# Patient Record
Sex: Female | Born: 1935 | Race: White | Hispanic: No | Smoking: Never smoker
Health system: Southern US, Community
[De-identification: ages and names within clinical notes are randomized; demographics above are authoritative.]

## PROBLEM LIST (undated history)

## (undated) DIAGNOSIS — Z923 Personal history of irradiation: Secondary | ICD-10-CM

## (undated) DIAGNOSIS — C189 Malignant neoplasm of colon, unspecified: Secondary | ICD-10-CM

## (undated) DIAGNOSIS — R32 Unspecified urinary incontinence: Secondary | ICD-10-CM

## (undated) DIAGNOSIS — G629 Polyneuropathy, unspecified: Secondary | ICD-10-CM

## (undated) DIAGNOSIS — Z9221 Personal history of antineoplastic chemotherapy: Secondary | ICD-10-CM

## (undated) DIAGNOSIS — Z8619 Personal history of other infectious and parasitic diseases: Secondary | ICD-10-CM

## (undated) DIAGNOSIS — R0989 Other specified symptoms and signs involving the circulatory and respiratory systems: Secondary | ICD-10-CM

## (undated) DIAGNOSIS — N200 Calculus of kidney: Secondary | ICD-10-CM

## (undated) DIAGNOSIS — I10 Essential (primary) hypertension: Secondary | ICD-10-CM

## (undated) DIAGNOSIS — E7800 Pure hypercholesterolemia, unspecified: Secondary | ICD-10-CM

## (undated) HISTORY — PX: VENTRAL HERNIA REPAIR: SHX424

## (undated) HISTORY — DX: Pure hypercholesterolemia, unspecified: E78.00

## (undated) HISTORY — DX: Unspecified urinary incontinence: R32

## (undated) HISTORY — DX: Malignant neoplasm of colon, unspecified: C18.9

## (undated) HISTORY — DX: Calculus of kidney: N20.0

## (undated) HISTORY — PX: COLOSTOMY: SHX63

## (undated) HISTORY — DX: Polyneuropathy, unspecified: G62.9

## (undated) HISTORY — DX: Essential (primary) hypertension: I10

## (undated) HISTORY — PX: OTHER SURGICAL HISTORY: SHX169

## (undated) HISTORY — DX: Personal history of other infectious and parasitic diseases: Z86.19

## (undated) HISTORY — DX: Other specified symptoms and signs involving the circulatory and respiratory systems: R09.89

---

## 1977-01-29 HISTORY — PX: APPENDECTOMY: SHX54

## 2004-05-09 ENCOUNTER — Ambulatory Visit: Payer: Self-pay | Admitting: Internal Medicine

## 2005-05-10 ENCOUNTER — Ambulatory Visit: Payer: Self-pay | Admitting: Internal Medicine

## 2005-05-22 ENCOUNTER — Ambulatory Visit: Payer: Self-pay | Admitting: Internal Medicine

## 2006-08-06 ENCOUNTER — Ambulatory Visit: Payer: Self-pay | Admitting: Internal Medicine

## 2007-08-07 ENCOUNTER — Ambulatory Visit: Payer: Self-pay | Admitting: Internal Medicine

## 2007-08-14 ENCOUNTER — Ambulatory Visit: Payer: Self-pay | Admitting: Internal Medicine

## 2008-09-15 ENCOUNTER — Ambulatory Visit: Payer: Self-pay | Admitting: Internal Medicine

## 2009-09-20 ENCOUNTER — Ambulatory Visit: Payer: Self-pay | Admitting: Internal Medicine

## 2010-02-23 ENCOUNTER — Ambulatory Visit: Payer: Self-pay | Admitting: Internal Medicine

## 2010-04-14 ENCOUNTER — Ambulatory Visit: Payer: Self-pay | Admitting: Urology

## 2010-09-25 ENCOUNTER — Ambulatory Visit: Payer: Self-pay | Admitting: Internal Medicine

## 2011-11-01 ENCOUNTER — Ambulatory Visit: Payer: Self-pay | Admitting: Internal Medicine

## 2012-01-30 DIAGNOSIS — Z923 Personal history of irradiation: Secondary | ICD-10-CM

## 2012-01-30 DIAGNOSIS — Z9221 Personal history of antineoplastic chemotherapy: Secondary | ICD-10-CM

## 2012-01-30 HISTORY — DX: Personal history of irradiation: Z92.3

## 2012-01-30 HISTORY — DX: Personal history of antineoplastic chemotherapy: Z92.21

## 2012-10-10 ENCOUNTER — Ambulatory Visit: Payer: Self-pay | Admitting: Gastroenterology

## 2012-10-13 LAB — PATHOLOGY REPORT

## 2012-10-16 ENCOUNTER — Ambulatory Visit: Payer: Self-pay | Admitting: Oncology

## 2012-10-21 ENCOUNTER — Ambulatory Visit: Payer: Self-pay | Admitting: Oncology

## 2012-10-21 LAB — COMPREHENSIVE METABOLIC PANEL
Albumin: 3.7 g/dL (ref 3.4–5.0)
Alkaline Phosphatase: 88 U/L (ref 50–136)
Anion Gap: 3 — ABNORMAL LOW (ref 7–16)
BUN: 15 mg/dL (ref 7–18)
Chloride: 105 mmol/L (ref 98–107)
Co2: 32 mmol/L (ref 21–32)
Creatinine: 1.01 mg/dL (ref 0.60–1.30)
EGFR (African American): >60
EGFR (Non-African Amer.): 54 — ABNORMAL LOW
Osmolality: 281 (ref 275–301)
Sodium: 140 mmol/L (ref 136–145)
Total Protein: 7.3 g/dL (ref 6.4–8.2)

## 2012-10-21 LAB — CBC CANCER CENTER
Basophil #: 0.1 x10 3/mm (ref 0.0–0.1)
Basophil %: 1.2 %
Eosinophil #: 0 x10 3/mm (ref 0.0–0.7)
Eosinophil %: 0.3 %
HCT: 43.3 % (ref 35.0–47.0)
Lymphocyte %: 20.1 %
MCV: 100 fL (ref 80–100)
Monocyte #: 0.7 x10 3/mm (ref 0.2–0.9)
Monocyte %: 7 %
Neutrophil %: 71.4 %
WBC: 10.6 x10 3/mm (ref 3.6–11.0)

## 2012-10-21 LAB — PROTIME-INR: Prothrombin Time: 14.2 secs (ref 11.5–14.7)

## 2012-10-29 ENCOUNTER — Ambulatory Visit: Payer: Self-pay | Admitting: Oncology

## 2012-10-30 ENCOUNTER — Ambulatory Visit: Payer: Self-pay | Admitting: Gastroenterology

## 2012-11-03 ENCOUNTER — Ambulatory Visit: Payer: Self-pay | Admitting: Internal Medicine

## 2012-11-06 ENCOUNTER — Ambulatory Visit: Payer: Self-pay | Admitting: Oncology

## 2012-11-11 ENCOUNTER — Ambulatory Visit: Payer: Self-pay | Admitting: Surgery

## 2012-11-13 ENCOUNTER — Encounter: Payer: Self-pay | Admitting: Podiatry

## 2012-11-14 ENCOUNTER — Ambulatory Visit (INDEPENDENT_AMBULATORY_CARE_PROVIDER_SITE_OTHER): Payer: Medicare Other

## 2012-11-14 ENCOUNTER — Ambulatory Visit (INDEPENDENT_AMBULATORY_CARE_PROVIDER_SITE_OTHER): Payer: Medicare Other | Admitting: Podiatry

## 2012-11-14 ENCOUNTER — Encounter: Payer: Self-pay | Admitting: Podiatry

## 2012-11-14 VITALS — BP 126/79 | HR 77 | Resp 14 | Ht 64.0 in | Wt 178.0 lb

## 2012-11-14 DIAGNOSIS — M21611 Bunion of right foot: Secondary | ICD-10-CM

## 2012-11-14 DIAGNOSIS — M21612 Bunion of left foot: Secondary | ICD-10-CM

## 2012-11-14 DIAGNOSIS — L600 Ingrowing nail: Secondary | ICD-10-CM

## 2012-11-14 DIAGNOSIS — M21619 Bunion of unspecified foot: Secondary | ICD-10-CM

## 2012-11-14 DIAGNOSIS — M2010 Hallux valgus (acquired), unspecified foot: Secondary | ICD-10-CM

## 2012-11-14 DIAGNOSIS — M2040 Other hammer toe(s) (acquired), unspecified foot: Secondary | ICD-10-CM

## 2012-11-14 NOTE — Progress Notes (Signed)
N ingrown toenail, was infected  L left great medial corner  D 2 weeks ago  O gradual  C better  A T was on antibiotic doxcycline ,   Both feet bunions and hammertoes

## 2012-11-14 NOTE — Patient Instructions (Signed)

## 2012-11-14 NOTE — Progress Notes (Signed)
Subjective:     [name], female   DOB: 11/29/1935, 77 y.o.   MRN: [id number]  HPI patient presents to the office stating I have a bad ingrown toenail on her left big toe and a spicule of nail on my right that is bothersome and catches when I put on socks or shoes patient states that she was on an antibiotic for the left big toe and now it is just sore but not in fact   Review of Systems  All other systems reviewed and are negative.       Objective:   Physical Exam  Nursing note and vitals reviewed. Constitutional: She appears well-developed and well-nourished.  Cardiovascular: Intact distal pulses.   Musculoskeletal: Normal range of motion.  Neurological: She is alert.   patient is found to have structural imbalances in the forefoot with elevated second toes bilateral rigid contracture that do become painful and structural bunion deformity. I noted the hallux nails bilateral medial borders are ingrown with the left hallux being along the entire border and the right hallux being a spicule from previous nail surgery     Assessment:     Hammertoe deformity second bilateral with rigid contracture and HAV deformity bilateral. Chronic ingrown toenails hallux bilateral    Plan:     H&P and x-rays reviewed with the patient. I went today and I discussed correction of the ingrown toenails and we discussed the bunions hammertoes and have recommended not doing surgery but trying Padding. I explained to her the risk of fixing the ingrown toenails and she wants to have it done I infiltrated each hallux 60 mg Xylocaine Marcaine mixture and removed the medial border I then applied chemical 3 applications followed by alcohol lavage and each border. I applied sterile dressings and instructed on soaks

## 2012-11-18 ENCOUNTER — Ambulatory Visit: Payer: Self-pay | Admitting: Surgery

## 2012-11-18 ENCOUNTER — Telehealth: Payer: Self-pay | Admitting: *Deleted

## 2012-11-18 NOTE — Telephone Encounter (Signed)
Pt called wanting to know how long to soak. Advised pt to soak twice daily in betadine and water until toe stops draining.

## 2012-11-24 LAB — COMPREHENSIVE METABOLIC PANEL
Alkaline Phosphatase: 71 U/L (ref 50–136)
Anion Gap: 9 (ref 7–16)
BUN: 13 mg/dL (ref 7–18)
Bilirubin,Total: 0.6 mg/dL (ref 0.2–1.0)
Calcium, Total: 8.3 mg/dL — ABNORMAL LOW (ref 8.5–10.1)
Chloride: 103 mmol/L (ref 98–107)
Creatinine: 0.96 mg/dL (ref 0.60–1.30)
EGFR (Non-African Amer.): 58 — ABNORMAL LOW
Glucose: 179 mg/dL — ABNORMAL HIGH (ref 65–99)
Potassium: 3.5 mmol/L (ref 3.5–5.1)
SGPT (ALT): 34 U/L (ref 12–78)

## 2012-11-24 LAB — CBC CANCER CENTER
Basophil %: 1.1 %
Eosinophil %: 1.1 %
HCT: 40.9 % (ref 35.0–47.0)
HGB: 13.8 g/dL (ref 12.0–16.0)
Lymphocyte #: 1.7 x10 3/mm (ref 1.0–3.6)
MCH: 34.1 pg — ABNORMAL HIGH (ref 26.0–34.0)
Monocyte #: 0.7 x10 3/mm (ref 0.2–0.9)
Neutrophil %: 74.3 %
Platelet: 298 x10 3/mm (ref 150–440)
RBC: 4.05 10*6/uL (ref 3.80–5.20)
WBC: 10 x10 3/mm (ref 3.6–11.0)

## 2012-11-29 ENCOUNTER — Ambulatory Visit: Payer: Self-pay | Admitting: Oncology

## 2012-12-01 LAB — COMPREHENSIVE METABOLIC PANEL
Albumin: 3.3 g/dL — ABNORMAL LOW (ref 3.4–5.0)
Alkaline Phosphatase: 80 U/L (ref 50–136)
Anion Gap: 8 (ref 7–16)
BUN: 12 mg/dL (ref 7–18)
Chloride: 102 mmol/L (ref 98–107)
Creatinine: 0.89 mg/dL (ref 0.60–1.30)
EGFR (African American): >60
EGFR (Non-African Amer.): >60
Potassium: 3.8 mmol/L (ref 3.5–5.1)
SGPT (ALT): 30 U/L (ref 12–78)
Total Protein: 6.7 g/dL (ref 6.4–8.2)

## 2012-12-01 LAB — CBC CANCER CENTER
Basophil #: 0.1 x10 3/mm (ref 0.0–0.1)
Eosinophil #: 0.3 x10 3/mm (ref 0.0–0.7)
Eosinophil %: 3.1 %
HCT: 40.8 % (ref 35.0–47.0)
HGB: 13.9 g/dL (ref 12.0–16.0)
Lymphocyte %: 12.9 %
MCHC: 34 g/dL (ref 32.0–36.0)
Monocyte #: 0.5 x10 3/mm (ref 0.2–0.9)
Monocyte %: 5.5 %
Neutrophil #: 6.6 x10 3/mm — ABNORMAL HIGH (ref 1.4–6.5)
Neutrophil %: 77.5 %
Platelet: 287 x10 3/mm (ref 150–440)
RBC: 4.04 10*6/uL (ref 3.80–5.20)
RDW: 12.7 % (ref 11.5–14.5)
WBC: 8.5 x10 3/mm (ref 3.6–11.0)

## 2012-12-08 LAB — CBC CANCER CENTER
Basophil #: 0 x10 3/mm (ref 0.0–0.1)
Basophil %: 0.5 %
HGB: 12.9 g/dL (ref 12.0–16.0)
Lymphocyte #: 0.6 x10 3/mm — ABNORMAL LOW (ref 1.0–3.6)
MCH: 34.3 pg — ABNORMAL HIGH (ref 26.0–34.0)
MCHC: 33.9 g/dL (ref 32.0–36.0)
MCV: 101 fL — ABNORMAL HIGH (ref 80–100)
Monocyte #: 0.5 x10 3/mm (ref 0.2–0.9)
Monocyte %: 6.9 %
Neutrophil #: 5.4 x10 3/mm (ref 1.4–6.5)
Platelet: 235 x10 3/mm (ref 150–440)
RBC: 3.77 10*6/uL — ABNORMAL LOW (ref 3.80–5.20)
WBC: 7 x10 3/mm (ref 3.6–11.0)

## 2012-12-08 LAB — COMPREHENSIVE METABOLIC PANEL
Albumin: 3.1 g/dL — ABNORMAL LOW (ref 3.4–5.0)
Alkaline Phosphatase: 75 U/L (ref 50–136)
Anion Gap: 6 — ABNORMAL LOW (ref 7–16)
BUN: 10 mg/dL (ref 7–18)
Bilirubin,Total: 0.7 mg/dL (ref 0.2–1.0)
Calcium, Total: 8.7 mg/dL (ref 8.5–10.1)
Co2: 31 mmol/L (ref 21–32)
EGFR (African American): >60
Glucose: 192 mg/dL — ABNORMAL HIGH (ref 65–99)
SGPT (ALT): 28 U/L (ref 12–78)

## 2012-12-15 LAB — COMPREHENSIVE METABOLIC PANEL
Alkaline Phosphatase: 68 U/L (ref 50–136)
Anion Gap: 6 — ABNORMAL LOW (ref 7–16)
BUN: 15 mg/dL (ref 7–18)
Bilirubin,Total: 0.5 mg/dL (ref 0.2–1.0)
Calcium, Total: 8.2 mg/dL — ABNORMAL LOW (ref 8.5–10.1)
Co2: 31 mmol/L (ref 21–32)
Creatinine: 0.91 mg/dL (ref 0.60–1.30)
EGFR (African American): >60
Osmolality: 281 (ref 275–301)
SGOT(AST): 24 U/L (ref 15–37)
Sodium: 138 mmol/L (ref 136–145)
Total Protein: 6.2 g/dL — ABNORMAL LOW (ref 6.4–8.2)

## 2012-12-15 LAB — CBC CANCER CENTER
Basophil #: 0.1 x10 3/mm (ref 0.0–0.1)
Basophil %: 1.4 %
Eosinophil %: 8.2 %
HCT: 38.1 % (ref 35.0–47.0)
HGB: 13 g/dL (ref 12.0–16.0)
Lymphocyte #: 0.6 x10 3/mm — ABNORMAL LOW (ref 1.0–3.6)
MCH: 34.3 pg — ABNORMAL HIGH (ref 26.0–34.0)
MCHC: 34 g/dL (ref 32.0–36.0)
Monocyte #: 0.6 x10 3/mm (ref 0.2–0.9)
Monocyte %: 9.6 %
Neutrophil #: 4.7 x10 3/mm (ref 1.4–6.5)
Platelet: 176 x10 3/mm (ref 150–440)
RBC: 3.78 10*6/uL — ABNORMAL LOW (ref 3.80–5.20)
RDW: 13.4 % (ref 11.5–14.5)
WBC: 6.6 x10 3/mm (ref 3.6–11.0)

## 2012-12-22 LAB — CBC CANCER CENTER
Basophil %: 0.6 %
Eosinophil #: 0.6 x10 3/mm (ref 0.0–0.7)
Eosinophil %: 9.6 %
HGB: 12.7 g/dL (ref 12.0–16.0)
Lymphocyte #: 0.5 x10 3/mm — ABNORMAL LOW (ref 1.0–3.6)
MCH: 34.3 pg — ABNORMAL HIGH (ref 26.0–34.0)
MCV: 102 fL — ABNORMAL HIGH (ref 80–100)
Monocyte #: 0.5 x10 3/mm (ref 0.2–0.9)
Neutrophil %: 73.6 %
Platelet: 195 x10 3/mm (ref 150–440)
RBC: 3.72 10*6/uL — ABNORMAL LOW (ref 3.80–5.20)
RDW: 13.8 % (ref 11.5–14.5)

## 2012-12-22 LAB — COMPREHENSIVE METABOLIC PANEL
Albumin: 2.9 g/dL — ABNORMAL LOW (ref 3.4–5.0)
Alkaline Phosphatase: 58 U/L
Anion Gap: 6 — ABNORMAL LOW (ref 7–16)
BUN: 8 mg/dL (ref 7–18)
Bilirubin,Total: 0.6 mg/dL (ref 0.2–1.0)
Calcium, Total: 8.2 mg/dL — ABNORMAL LOW (ref 8.5–10.1)
Co2: 29 mmol/L (ref 21–32)
Creatinine: 0.83 mg/dL (ref 0.60–1.30)
EGFR (African American): >60
Glucose: 145 mg/dL — ABNORMAL HIGH (ref 65–99)
Potassium: 3.7 mmol/L (ref 3.5–5.1)
SGOT(AST): 22 U/L (ref 15–37)
SGPT (ALT): 29 U/L (ref 12–78)

## 2012-12-29 ENCOUNTER — Ambulatory Visit: Payer: Self-pay | Admitting: Oncology

## 2012-12-29 LAB — CBC CANCER CENTER
Eosinophil #: 0.2 x10 3/mm (ref 0.0–0.7)
Eosinophil %: 3.3 %
HCT: 36.3 % (ref 35.0–47.0)
Lymphocyte #: 0.3 x10 3/mm — ABNORMAL LOW (ref 1.0–3.6)
MCH: 35.2 pg — ABNORMAL HIGH (ref 26.0–34.0)
MCHC: 34.9 g/dL (ref 32.0–36.0)
MCV: 101 fL — ABNORMAL HIGH (ref 80–100)
Monocyte %: 19.2 %
Neutrophil #: 4.2 x10 3/mm (ref 1.4–6.5)
Platelet: 276 x10 3/mm (ref 150–440)
RBC: 3.59 10*6/uL — ABNORMAL LOW (ref 3.80–5.20)
RDW: 14.5 % (ref 11.5–14.5)
WBC: 5.7 x10 3/mm (ref 3.6–11.0)

## 2012-12-29 LAB — COMPREHENSIVE METABOLIC PANEL
Albumin: 2.6 g/dL — ABNORMAL LOW (ref 3.4–5.0)
Alkaline Phosphatase: 56 U/L
Anion Gap: 10 (ref 7–16)
BUN: 11 mg/dL (ref 7–18)
Bilirubin,Total: 0.9 mg/dL (ref 0.2–1.0)
Calcium, Total: 8.1 mg/dL — ABNORMAL LOW (ref 8.5–10.1)
Chloride: 96 mmol/L — ABNORMAL LOW (ref 98–107)
Creatinine: 0.98 mg/dL (ref 0.60–1.30)
EGFR (Non-African Amer.): 56 — ABNORMAL LOW
Glucose: 189 mg/dL — ABNORMAL HIGH (ref 65–99)
Osmolality: 273 (ref 275–301)
SGOT(AST): 19 U/L (ref 15–37)
SGPT (ALT): 26 U/L (ref 12–78)

## 2012-12-29 LAB — MAGNESIUM: Magnesium: 1.7 mg/dL — ABNORMAL LOW

## 2013-01-05 LAB — COMPREHENSIVE METABOLIC PANEL
Alkaline Phosphatase: 97 U/L
Anion Gap: 7 (ref 7–16)
BUN: 12 mg/dL (ref 7–18)
Calcium, Total: 8.1 mg/dL — ABNORMAL LOW (ref 8.5–10.1)
Chloride: 93 mmol/L — ABNORMAL LOW (ref 98–107)
Co2: 31 mmol/L (ref 21–32)
Glucose: 197 mg/dL — ABNORMAL HIGH (ref 65–99)
Osmolality: 268 (ref 275–301)
Potassium: 3 mmol/L — ABNORMAL LOW (ref 3.5–5.1)
SGOT(AST): 24 U/L (ref 15–37)
Total Protein: 5.7 g/dL — ABNORMAL LOW (ref 6.4–8.2)

## 2013-01-05 LAB — CBC CANCER CENTER
Basophil #: 0 x10 3/mm (ref 0.0–0.1)
Basophil %: 0.2 %
Eosinophil #: 0 x10 3/mm (ref 0.0–0.7)
Eosinophil %: 0.3 %
HCT: 32.9 % — ABNORMAL LOW (ref 35.0–47.0)
HGB: 11.4 g/dL — ABNORMAL LOW (ref 12.0–16.0)
Lymphocyte #: 0.1 x10 3/mm — ABNORMAL LOW (ref 1.0–3.6)
MCV: 101 fL — ABNORMAL HIGH (ref 80–100)
Monocyte #: 1 x10 3/mm — ABNORMAL HIGH (ref 0.2–0.9)
Monocyte %: 11.4 %
Neutrophil %: 86.7 %
Platelet: 327 x10 3/mm (ref 150–440)
RBC: 3.25 10*6/uL — ABNORMAL LOW (ref 3.80–5.20)

## 2013-01-05 LAB — MAGNESIUM: Magnesium: 1.9 mg/dL

## 2013-01-12 LAB — CBC CANCER CENTER
Basophil %: 0.5 %
Eosinophil %: 0.1 %
HCT: 32.8 % — ABNORMAL LOW (ref 35.0–47.0)
HGB: 11.1 g/dL — ABNORMAL LOW (ref 12.0–16.0)
Lymphocyte #: 0.4 x10 3/mm — ABNORMAL LOW (ref 1.0–3.6)
Lymphocyte %: 3.9 %
MCHC: 33.9 g/dL (ref 32.0–36.0)
Monocyte #: 0.9 x10 3/mm (ref 0.2–0.9)
Monocyte %: 9.6 %
Neutrophil %: 85.9 %
RDW: 16.3 % — ABNORMAL HIGH (ref 11.5–14.5)

## 2013-01-12 LAB — COMPREHENSIVE METABOLIC PANEL
Albumin: 2.1 g/dL — ABNORMAL LOW (ref 3.4–5.0)
Alkaline Phosphatase: 82 U/L
BUN: 7 mg/dL (ref 7–18)
Bilirubin,Total: 0.7 mg/dL (ref 0.2–1.0)
Calcium, Total: 7.9 mg/dL — ABNORMAL LOW (ref 8.5–10.1)
Chloride: 94 mmol/L — ABNORMAL LOW (ref 98–107)
EGFR (Non-African Amer.): 56 — ABNORMAL LOW
Glucose: 221 mg/dL — ABNORMAL HIGH (ref 65–99)
Osmolality: 271 (ref 275–301)
Potassium: 3.5 mmol/L (ref 3.5–5.1)
SGPT (ALT): 28 U/L (ref 12–78)
Sodium: 133 mmol/L — ABNORMAL LOW (ref 136–145)

## 2013-01-13 LAB — CEA: CEA: 1.8 ng/mL (ref 0.0–4.7)

## 2013-01-29 ENCOUNTER — Ambulatory Visit: Payer: Self-pay | Admitting: Oncology

## 2013-02-25 ENCOUNTER — Ambulatory Visit: Payer: Self-pay | Admitting: Surgery

## 2013-02-25 LAB — CBC
HCT: 35.9 % (ref 35.0–47.0)
HGB: 12.2 g/dL (ref 12.0–16.0)
MCH: 36 pg — ABNORMAL HIGH (ref 26.0–34.0)
MCHC: 33.9 g/dL (ref 32.0–36.0)
MCV: 106 fL — AB (ref 80–100)
Platelet: 296 10*3/uL (ref 150–440)
RBC: 3.38 10*6/uL — ABNORMAL LOW (ref 3.80–5.20)
RDW: 15.3 % — ABNORMAL HIGH (ref 11.5–14.5)
WBC: 6.5 10*3/uL (ref 3.6–11.0)

## 2013-02-25 LAB — COMPREHENSIVE METABOLIC PANEL
ALBUMIN: 3.4 g/dL (ref 3.4–5.0)
ANION GAP: 4 — AB (ref 7–16)
Alkaline Phosphatase: 63 U/L
BILIRUBIN TOTAL: 0.7 mg/dL (ref 0.2–1.0)
BUN: 8 mg/dL (ref 7–18)
CREATININE: 0.89 mg/dL (ref 0.60–1.30)
Calcium, Total: 8.9 mg/dL (ref 8.5–10.1)
Chloride: 103 mmol/L (ref 98–107)
Co2: 34 mmol/L — ABNORMAL HIGH (ref 21–32)
EGFR (African American): >60
EGFR (Non-African Amer.): >60
GLUCOSE: 183 mg/dL — AB (ref 65–99)
Osmolality: 284 (ref 275–301)
POTASSIUM: 3.7 mmol/L (ref 3.5–5.1)
SGOT(AST): 22 U/L (ref 15–37)
SGPT (ALT): 25 U/L (ref 12–78)
SODIUM: 141 mmol/L (ref 136–145)
Total Protein: 6.6 g/dL (ref 6.4–8.2)

## 2013-03-02 ENCOUNTER — Ambulatory Visit: Payer: Self-pay | Admitting: Oncology

## 2013-03-02 LAB — CBC CANCER CENTER
BASOS PCT: 3.9 %
Basophil #: 0.3 x10 3/mm — ABNORMAL HIGH (ref 0.0–0.1)
EOS PCT: 0.2 %
Eosinophil #: 0 x10 3/mm (ref 0.0–0.7)
HCT: 37.6 % (ref 35.0–47.0)
HGB: 12.5 g/dL (ref 12.0–16.0)
Lymphocyte #: 1.3 x10 3/mm (ref 1.0–3.6)
Lymphocyte %: 17.6 %
MCH: 35.1 pg — ABNORMAL HIGH (ref 26.0–34.0)
MCHC: 33.3 g/dL (ref 32.0–36.0)
MCV: 105 fL — ABNORMAL HIGH (ref 80–100)
MONOS PCT: 8.9 %
Monocyte #: 0.6 x10 3/mm (ref 0.2–0.9)
Neutrophil #: 4.9 x10 3/mm (ref 1.4–6.5)
Neutrophil %: 69.4 %
PLATELETS: 274 x10 3/mm (ref 150–440)
RBC: 3.56 10*6/uL — AB (ref 3.80–5.20)
RDW: 14.2 % (ref 11.5–14.5)
WBC: 7.1 x10 3/mm (ref 3.6–11.0)

## 2013-03-02 LAB — COMPREHENSIVE METABOLIC PANEL
ALT: 22 U/L (ref 12–78)
AST: 23 U/L (ref 15–37)
Albumin: 3.3 g/dL — ABNORMAL LOW (ref 3.4–5.0)
Alkaline Phosphatase: 60 U/L
Anion Gap: 12 (ref 7–16)
BILIRUBIN TOTAL: 0.6 mg/dL (ref 0.2–1.0)
BUN: 10 mg/dL (ref 7–18)
CO2: 28 mmol/L (ref 21–32)
CREATININE: 0.88 mg/dL (ref 0.60–1.30)
Calcium, Total: 8.2 mg/dL — ABNORMAL LOW (ref 8.5–10.1)
Chloride: 101 mmol/L (ref 98–107)
EGFR (African American): >60
Glucose: 137 mg/dL — ABNORMAL HIGH (ref 65–99)
Osmolality: 282 (ref 275–301)
Potassium: 2.9 mmol/L — ABNORMAL LOW (ref 3.5–5.1)
SODIUM: 141 mmol/L (ref 136–145)
Total Protein: 6.5 g/dL (ref 6.4–8.2)

## 2013-03-03 LAB — CEA: CEA: 1.2 ng/mL (ref 0.0–4.7)

## 2013-03-06 ENCOUNTER — Inpatient Hospital Stay: Payer: Self-pay | Admitting: Surgery

## 2013-03-06 DIAGNOSIS — C218 Malignant neoplasm of overlapping sites of rectum, anus and anal canal: Secondary | ICD-10-CM

## 2013-03-06 DIAGNOSIS — R32 Unspecified urinary incontinence: Secondary | ICD-10-CM

## 2013-03-06 HISTORY — DX: Unspecified urinary incontinence: R32

## 2013-03-07 LAB — CBC WITH DIFFERENTIAL/PLATELET
Basophil #: 0.1 10*3/uL (ref 0.0–0.1)
Basophil %: 0.7 %
Eosinophil #: 0 10*3/uL (ref 0.0–0.7)
Eosinophil %: 0 %
HCT: 31.3 % — ABNORMAL LOW (ref 35.0–47.0)
HGB: 10.9 g/dL — ABNORMAL LOW (ref 12.0–16.0)
LYMPHS ABS: 1 10*3/uL (ref 1.0–3.6)
LYMPHS PCT: 7.2 %
MCH: 37.3 pg — ABNORMAL HIGH (ref 26.0–34.0)
MCHC: 34.9 g/dL (ref 32.0–36.0)
MCV: 107 fL — ABNORMAL HIGH (ref 80–100)
MONO ABS: 1.1 x10 3/mm — AB (ref 0.2–0.9)
Monocyte %: 7.7 %
NEUTROS PCT: 84.4 %
Neutrophil #: 12.1 10*3/uL — ABNORMAL HIGH (ref 1.4–6.5)
Platelet: 242 10*3/uL (ref 150–440)
RBC: 2.93 10*6/uL — ABNORMAL LOW (ref 3.80–5.20)
RDW: 14.3 % (ref 11.5–14.5)
WBC: 14.3 10*3/uL — AB (ref 3.6–11.0)

## 2013-03-07 LAB — BASIC METABOLIC PANEL
ANION GAP: 5 — AB (ref 7–16)
BUN: 7 mg/dL (ref 7–18)
CO2: 29 mmol/L (ref 21–32)
CREATININE: 0.87 mg/dL (ref 0.60–1.30)
Calcium, Total: 7.9 mg/dL — ABNORMAL LOW (ref 8.5–10.1)
Chloride: 103 mmol/L (ref 98–107)
EGFR (African American): >60
EGFR (Non-African Amer.): >60
Glucose: 197 mg/dL — ABNORMAL HIGH (ref 65–99)
OSMOLALITY: 277 (ref 275–301)
POTASSIUM: 3.1 mmol/L — AB (ref 3.5–5.1)
SODIUM: 137 mmol/L (ref 136–145)

## 2013-03-08 LAB — CBC WITH DIFFERENTIAL/PLATELET
BASOS ABS: 0 10*3/uL (ref 0.0–0.1)
Basophil %: 0.4 %
EOS ABS: 0.1 10*3/uL (ref 0.0–0.7)
EOS PCT: 1 %
HCT: 32.3 % — ABNORMAL LOW (ref 35.0–47.0)
HGB: 10.8 g/dL — AB (ref 12.0–16.0)
Lymphocyte #: 1 10*3/uL (ref 1.0–3.6)
Lymphocyte %: 10.8 %
MCH: 36.4 pg — ABNORMAL HIGH (ref 26.0–34.0)
MCHC: 33.5 g/dL (ref 32.0–36.0)
MCV: 109 fL — ABNORMAL HIGH (ref 80–100)
Monocyte #: 0.9 x10 3/mm (ref 0.2–0.9)
Monocyte %: 10.3 %
Neutrophil #: 7.1 10*3/uL — ABNORMAL HIGH (ref 1.4–6.5)
Neutrophil %: 77.5 %
Platelet: 272 10*3/uL (ref 150–440)
RBC: 2.97 10*6/uL — ABNORMAL LOW (ref 3.80–5.20)
RDW: 14.1 % (ref 11.5–14.5)
WBC: 9.2 10*3/uL (ref 3.6–11.0)

## 2013-03-08 LAB — BASIC METABOLIC PANEL
Anion Gap: 2 — ABNORMAL LOW (ref 7–16)
BUN: 6 mg/dL — ABNORMAL LOW (ref 7–18)
CREATININE: 0.84 mg/dL (ref 0.60–1.30)
Calcium, Total: 7.8 mg/dL — ABNORMAL LOW (ref 8.5–10.1)
Chloride: 105 mmol/L (ref 98–107)
Co2: 30 mmol/L (ref 21–32)
Glucose: 129 mg/dL — ABNORMAL HIGH (ref 65–99)
OSMOLALITY: 273 (ref 275–301)
Potassium: 3.9 mmol/L (ref 3.5–5.1)
Sodium: 137 mmol/L (ref 136–145)

## 2013-03-11 LAB — PATHOLOGY REPORT

## 2013-03-12 LAB — PLATELET COUNT: Platelet: 279 10*3/uL (ref 150–440)

## 2013-03-16 LAB — PLATELET COUNT: Platelet: 330 10*3/uL (ref 150–440)

## 2013-03-20 LAB — PLATELET COUNT: Platelet: 325 10*3/uL (ref 150–440)

## 2013-03-29 ENCOUNTER — Ambulatory Visit: Payer: Self-pay | Admitting: Oncology

## 2013-04-13 LAB — CBC CANCER CENTER
BASOS ABS: 0.1 x10 3/mm (ref 0.0–0.1)
BASOS PCT: 1.3 %
Eosinophil #: 0 x10 3/mm (ref 0.0–0.7)
Eosinophil %: 0.6 %
HCT: 35.4 % (ref 35.0–47.0)
HGB: 11.8 g/dL — AB (ref 12.0–16.0)
Lymphocyte #: 0.7 x10 3/mm — ABNORMAL LOW (ref 1.0–3.6)
Lymphocyte %: 10.4 %
MCH: 33.9 pg (ref 26.0–34.0)
MCHC: 33.3 g/dL (ref 32.0–36.0)
MCV: 102 fL — AB (ref 80–100)
MONO ABS: 0.7 x10 3/mm (ref 0.2–0.9)
MONOS PCT: 9.6 %
NEUTROS ABS: 5.5 x10 3/mm (ref 1.4–6.5)
Neutrophil %: 78.1 %
Platelet: 358 x10 3/mm (ref 150–440)
RBC: 3.47 10*6/uL — ABNORMAL LOW (ref 3.80–5.20)
RDW: 13.2 % (ref 11.5–14.5)
WBC: 7 x10 3/mm (ref 3.6–11.0)

## 2013-04-13 LAB — COMPREHENSIVE METABOLIC PANEL
ALK PHOS: 69 U/L
AST: 13 U/L — AB (ref 15–37)
Albumin: 2.8 g/dL — ABNORMAL LOW (ref 3.4–5.0)
Anion Gap: 5 — ABNORMAL LOW (ref 7–16)
BUN: 3 mg/dL — AB (ref 7–18)
Bilirubin,Total: 0.4 mg/dL (ref 0.2–1.0)
CHLORIDE: 101 mmol/L (ref 98–107)
CO2: 33 mmol/L — AB (ref 21–32)
CREATININE: 1 mg/dL (ref 0.60–1.30)
Calcium, Total: 8.5 mg/dL (ref 8.5–10.1)
EGFR (African American): >60
GFR CALC NON AF AMER: 55 — AB
Glucose: 218 mg/dL — ABNORMAL HIGH (ref 65–99)
OSMOLALITY: 281 (ref 275–301)
POTASSIUM: 3.2 mmol/L — AB (ref 3.5–5.1)
SGPT (ALT): 13 U/L (ref 12–78)
SODIUM: 139 mmol/L (ref 136–145)
Total Protein: 6.3 g/dL — ABNORMAL LOW (ref 6.4–8.2)

## 2013-04-14 LAB — CEA: CEA: 1 ng/mL (ref 0.0–4.7)

## 2013-04-27 LAB — CBC CANCER CENTER
BASOS ABS: 0.1 x10 3/mm (ref 0.0–0.1)
Basophil %: 2.4 %
Eosinophil #: 0.1 x10 3/mm (ref 0.0–0.7)
Eosinophil %: 2.3 %
HCT: 34.9 % — ABNORMAL LOW (ref 35.0–47.0)
HGB: 11.7 g/dL — ABNORMAL LOW (ref 12.0–16.0)
LYMPHS PCT: 19.2 %
Lymphocyte #: 0.6 x10 3/mm — ABNORMAL LOW (ref 1.0–3.6)
MCH: 33.5 pg (ref 26.0–34.0)
MCHC: 33.5 g/dL (ref 32.0–36.0)
MCV: 100 fL (ref 80–100)
Monocyte #: 0.5 x10 3/mm (ref 0.2–0.9)
Monocyte %: 15.2 %
NEUTROS PCT: 60.9 %
Neutrophil #: 1.8 x10 3/mm (ref 1.4–6.5)
PLATELETS: 226 x10 3/mm (ref 150–440)
RBC: 3.49 10*6/uL — ABNORMAL LOW (ref 3.80–5.20)
RDW: 13.1 % (ref 11.5–14.5)
WBC: 3 x10 3/mm — ABNORMAL LOW (ref 3.6–11.0)

## 2013-04-27 LAB — COMPREHENSIVE METABOLIC PANEL
ALK PHOS: 74 U/L
Albumin: 2.9 g/dL — ABNORMAL LOW (ref 3.4–5.0)
Anion Gap: 7 (ref 7–16)
BILIRUBIN TOTAL: 0.3 mg/dL (ref 0.2–1.0)
BUN: 8 mg/dL (ref 7–18)
CHLORIDE: 102 mmol/L (ref 98–107)
CREATININE: 0.75 mg/dL (ref 0.60–1.30)
Calcium, Total: 8.5 mg/dL (ref 8.5–10.1)
Co2: 32 mmol/L (ref 21–32)
EGFR (African American): >60
EGFR (Non-African Amer.): >60
Glucose: 180 mg/dL — ABNORMAL HIGH (ref 65–99)
Osmolality: 284 (ref 275–301)
Potassium: 3.2 mmol/L — ABNORMAL LOW (ref 3.5–5.1)
SGOT(AST): 28 U/L (ref 15–37)
SGPT (ALT): 55 U/L (ref 12–78)
Sodium: 141 mmol/L (ref 136–145)
Total Protein: 6.2 g/dL — ABNORMAL LOW (ref 6.4–8.2)

## 2013-04-29 ENCOUNTER — Ambulatory Visit: Payer: Self-pay | Admitting: Oncology

## 2013-05-11 LAB — CBC CANCER CENTER
Basophil #: 0.1 x10 3/mm (ref 0.0–0.1)
Basophil %: 3.3 %
Eosinophil #: 0.1 x10 3/mm (ref 0.0–0.7)
Eosinophil %: 2.6 %
HCT: 34.9 % — ABNORMAL LOW (ref 35.0–47.0)
HGB: 11.7 g/dL — ABNORMAL LOW (ref 12.0–16.0)
LYMPHS ABS: 0.5 x10 3/mm — AB (ref 1.0–3.6)
Lymphocyte %: 24.4 %
MCH: 33.3 pg (ref 26.0–34.0)
MCHC: 33.6 g/dL (ref 32.0–36.0)
MCV: 99 fL (ref 80–100)
MONO ABS: 0.4 x10 3/mm (ref 0.2–0.9)
MONOS PCT: 17.6 %
NEUTROS PCT: 52.1 %
Neutrophil #: 1.1 x10 3/mm — ABNORMAL LOW (ref 1.4–6.5)
Platelet: 206 x10 3/mm (ref 150–440)
RBC: 3.52 10*6/uL — ABNORMAL LOW (ref 3.80–5.20)
RDW: 14.1 % (ref 11.5–14.5)
WBC: 2.2 x10 3/mm — ABNORMAL LOW (ref 3.6–11.0)

## 2013-05-11 LAB — COMPREHENSIVE METABOLIC PANEL
ALT: 55 U/L (ref 12–78)
ANION GAP: 9 (ref 7–16)
Albumin: 3 g/dL — ABNORMAL LOW (ref 3.4–5.0)
Alkaline Phosphatase: 72 U/L
BILIRUBIN TOTAL: 0.5 mg/dL (ref 0.2–1.0)
BUN: 10 mg/dL (ref 7–18)
CHLORIDE: 101 mmol/L (ref 98–107)
Calcium, Total: 8.6 mg/dL (ref 8.5–10.1)
Co2: 30 mmol/L (ref 21–32)
Creatinine: 0.75 mg/dL (ref 0.60–1.30)
Glucose: 196 mg/dL — ABNORMAL HIGH (ref 65–99)
Osmolality: 284 (ref 275–301)
POTASSIUM: 2.4 mmol/L — AB (ref 3.5–5.1)
SGOT(AST): 34 U/L (ref 15–37)
Sodium: 140 mmol/L (ref 136–145)
TOTAL PROTEIN: 6.1 g/dL — AB (ref 6.4–8.2)

## 2013-05-11 LAB — MAGNESIUM: Magnesium: 1.4 mg/dL — ABNORMAL LOW

## 2013-05-18 LAB — COMPREHENSIVE METABOLIC PANEL
ANION GAP: 8 (ref 7–16)
Albumin: 2.8 g/dL — ABNORMAL LOW (ref 3.4–5.0)
Alkaline Phosphatase: 63 U/L
BUN: 9 mg/dL (ref 7–18)
Bilirubin,Total: 0.3 mg/dL (ref 0.2–1.0)
CO2: 31 mmol/L (ref 21–32)
Calcium, Total: 9 mg/dL (ref 8.5–10.1)
Chloride: 104 mmol/L (ref 98–107)
Creatinine: 0.91 mg/dL (ref 0.60–1.30)
EGFR (Non-African Amer.): >60
Glucose: 168 mg/dL — ABNORMAL HIGH (ref 65–99)
Osmolality: 288 (ref 275–301)
POTASSIUM: 3.6 mmol/L (ref 3.5–5.1)
SGOT(AST): 19 U/L (ref 15–37)
SGPT (ALT): 23 U/L (ref 12–78)
Sodium: 143 mmol/L (ref 136–145)
TOTAL PROTEIN: 6 g/dL — AB (ref 6.4–8.2)

## 2013-05-18 LAB — CBC CANCER CENTER
BASOS ABS: 0.1 x10 3/mm (ref 0.0–0.1)
BASOS PCT: 1.8 %
Eosinophil #: 0 x10 3/mm (ref 0.0–0.7)
Eosinophil %: 0.7 %
HCT: 33.8 % — ABNORMAL LOW (ref 35.0–47.0)
HGB: 11.3 g/dL — ABNORMAL LOW (ref 12.0–16.0)
Lymphocyte #: 0.6 x10 3/mm — ABNORMAL LOW (ref 1.0–3.6)
Lymphocyte %: 12.3 %
MCH: 33.8 pg (ref 26.0–34.0)
MCHC: 33.5 g/dL (ref 32.0–36.0)
MCV: 101 fL — ABNORMAL HIGH (ref 80–100)
Monocyte #: 0.7 x10 3/mm (ref 0.2–0.9)
Monocyte %: 14.1 %
NEUTROS ABS: 3.3 x10 3/mm (ref 1.4–6.5)
Neutrophil %: 71.1 %
Platelet: 264 x10 3/mm (ref 150–440)
RBC: 3.35 10*6/uL — ABNORMAL LOW (ref 3.80–5.20)
RDW: 15.2 % — ABNORMAL HIGH (ref 11.5–14.5)
WBC: 4.6 x10 3/mm (ref 3.6–11.0)

## 2013-05-18 LAB — MAGNESIUM: MAGNESIUM: 1.9 mg/dL

## 2013-05-29 ENCOUNTER — Ambulatory Visit: Payer: Self-pay | Admitting: Oncology

## 2013-06-08 LAB — CBC CANCER CENTER
BASOS ABS: 0.1 x10 3/mm (ref 0.0–0.1)
Basophil %: 1.1 %
Eosinophil #: 0 x10 3/mm (ref 0.0–0.7)
Eosinophil %: 0.6 %
HCT: 33 % — ABNORMAL LOW (ref 35.0–47.0)
HGB: 11.1 g/dL — AB (ref 12.0–16.0)
LYMPHS PCT: 13.6 %
Lymphocyte #: 0.7 x10 3/mm — ABNORMAL LOW (ref 1.0–3.6)
MCH: 34.7 pg — AB (ref 26.0–34.0)
MCHC: 33.6 g/dL (ref 32.0–36.0)
MCV: 103 fL — ABNORMAL HIGH (ref 80–100)
MONOS PCT: 13.9 %
Monocyte #: 0.7 x10 3/mm (ref 0.2–0.9)
Neutrophil #: 3.4 x10 3/mm (ref 1.4–6.5)
Neutrophil %: 70.8 %
PLATELETS: 260 x10 3/mm (ref 150–440)
RBC: 3.2 10*6/uL — ABNORMAL LOW (ref 3.80–5.20)
RDW: 17.4 % — ABNORMAL HIGH (ref 11.5–14.5)
WBC: 4.8 x10 3/mm (ref 3.6–11.0)

## 2013-06-08 LAB — COMPREHENSIVE METABOLIC PANEL
ALBUMIN: 2.8 g/dL — AB (ref 3.4–5.0)
AST: 27 U/L (ref 15–37)
Alkaline Phosphatase: 66 U/L
Anion Gap: 6 — ABNORMAL LOW (ref 7–16)
BILIRUBIN TOTAL: 0.4 mg/dL (ref 0.2–1.0)
BUN: 9 mg/dL (ref 7–18)
CHLORIDE: 106 mmol/L (ref 98–107)
Calcium, Total: 8.4 mg/dL — ABNORMAL LOW (ref 8.5–10.1)
Co2: 30 mmol/L (ref 21–32)
Creatinine: 0.86 mg/dL (ref 0.60–1.30)
EGFR (African American): >60
Glucose: 168 mg/dL — ABNORMAL HIGH (ref 65–99)
OSMOLALITY: 286 (ref 275–301)
POTASSIUM: 3.8 mmol/L (ref 3.5–5.1)
SGPT (ALT): 29 U/L (ref 12–78)
SODIUM: 142 mmol/L (ref 136–145)
Total Protein: 6 g/dL — ABNORMAL LOW (ref 6.4–8.2)

## 2013-06-08 LAB — MAGNESIUM: MAGNESIUM: 1.8 mg/dL

## 2013-06-29 ENCOUNTER — Ambulatory Visit: Payer: Self-pay | Admitting: Oncology

## 2013-06-29 LAB — CBC CANCER CENTER
Basophil #: 0.1 x10 3/mm (ref 0.0–0.1)
Basophil %: 1.4 %
Eosinophil #: 0 x10 3/mm (ref 0.0–0.7)
Eosinophil %: 0.6 %
HCT: 34 % — ABNORMAL LOW (ref 35.0–47.0)
HGB: 11.5 g/dL — ABNORMAL LOW (ref 12.0–16.0)
Lymphocyte #: 0.6 x10 3/mm — ABNORMAL LOW (ref 1.0–3.6)
Lymphocyte %: 16.5 %
MCH: 35.7 pg — AB (ref 26.0–34.0)
MCHC: 33.8 g/dL (ref 32.0–36.0)
MCV: 106 fL — ABNORMAL HIGH (ref 80–100)
MONOS PCT: 16.3 %
Monocyte #: 0.6 x10 3/mm (ref 0.2–0.9)
Neutrophil #: 2.3 x10 3/mm (ref 1.4–6.5)
Neutrophil %: 65.2 %
Platelet: 231 x10 3/mm (ref 150–440)
RBC: 3.22 10*6/uL — AB (ref 3.80–5.20)
RDW: 18.5 % — ABNORMAL HIGH (ref 11.5–14.5)
WBC: 3.5 x10 3/mm — ABNORMAL LOW (ref 3.6–11.0)

## 2013-06-29 LAB — COMPREHENSIVE METABOLIC PANEL
ALK PHOS: 77 U/L
ANION GAP: 6 — AB (ref 7–16)
AST: 28 U/L (ref 15–37)
Albumin: 2.9 g/dL — ABNORMAL LOW (ref 3.4–5.0)
BILIRUBIN TOTAL: 0.3 mg/dL (ref 0.2–1.0)
BUN: 11 mg/dL (ref 7–18)
CALCIUM: 8.9 mg/dL (ref 8.5–10.1)
CHLORIDE: 107 mmol/L (ref 98–107)
Co2: 31 mmol/L (ref 21–32)
Creatinine: 0.75 mg/dL (ref 0.60–1.30)
EGFR (African American): >60
EGFR (Non-African Amer.): >60
GLUCOSE: 151 mg/dL — AB (ref 65–99)
OSMOLALITY: 289 (ref 275–301)
POTASSIUM: 3.9 mmol/L (ref 3.5–5.1)
SGPT (ALT): 32 U/L (ref 12–78)
Sodium: 144 mmol/L (ref 136–145)
TOTAL PROTEIN: 6 g/dL — AB (ref 6.4–8.2)

## 2013-07-14 DIAGNOSIS — E119 Type 2 diabetes mellitus without complications: Secondary | ICD-10-CM | POA: Insufficient documentation

## 2013-07-14 DIAGNOSIS — R739 Hyperglycemia, unspecified: Secondary | ICD-10-CM | POA: Insufficient documentation

## 2013-07-20 LAB — CBC CANCER CENTER
BASOS PCT: 1.4 %
Basophil #: 0 x10 3/mm (ref 0.0–0.1)
EOS ABS: 0 x10 3/mm (ref 0.0–0.7)
Eosinophil %: 0.5 %
HCT: 34.4 % — ABNORMAL LOW (ref 35.0–47.0)
HGB: 11.6 g/dL — ABNORMAL LOW (ref 12.0–16.0)
Lymphocyte #: 0.6 x10 3/mm — ABNORMAL LOW (ref 1.0–3.6)
Lymphocyte %: 18 %
MCH: 36.4 pg — AB (ref 26.0–34.0)
MCHC: 33.7 g/dL (ref 32.0–36.0)
MCV: 108 fL — ABNORMAL HIGH (ref 80–100)
MONOS PCT: 21 %
Monocyte #: 0.7 x10 3/mm (ref 0.2–0.9)
Neutrophil #: 1.9 x10 3/mm (ref 1.4–6.5)
Neutrophil %: 59.1 %
Platelet: 220 x10 3/mm (ref 150–440)
RBC: 3.19 10*6/uL — AB (ref 3.80–5.20)
RDW: 17.1 % — ABNORMAL HIGH (ref 11.5–14.5)
WBC: 3.2 x10 3/mm — ABNORMAL LOW (ref 3.6–11.0)

## 2013-07-20 LAB — COMPREHENSIVE METABOLIC PANEL
ALK PHOS: 74 U/L
ALT: 38 U/L (ref 12–78)
ANION GAP: 5 — AB (ref 7–16)
Albumin: 2.7 g/dL — ABNORMAL LOW (ref 3.4–5.0)
BUN: 10 mg/dL (ref 7–18)
Bilirubin,Total: 0.4 mg/dL (ref 0.2–1.0)
CO2: 31 mmol/L (ref 21–32)
Calcium, Total: 8.8 mg/dL (ref 8.5–10.1)
Chloride: 104 mmol/L (ref 98–107)
Creatinine: 0.8 mg/dL (ref 0.60–1.30)
EGFR (African American): >60
EGFR (Non-African Amer.): >60
Glucose: 149 mg/dL — ABNORMAL HIGH (ref 65–99)
Osmolality: 281 (ref 275–301)
Potassium: 3.7 mmol/L (ref 3.5–5.1)
SGOT(AST): 35 U/L (ref 15–37)
Sodium: 140 mmol/L (ref 136–145)
TOTAL PROTEIN: 5.8 g/dL — AB (ref 6.4–8.2)

## 2013-07-29 ENCOUNTER — Ambulatory Visit: Payer: Self-pay | Admitting: Oncology

## 2013-08-10 LAB — CBC CANCER CENTER
BASOS ABS: 0.1 x10 3/mm (ref 0.0–0.1)
BASOS PCT: 2.6 %
Eosinophil #: 0 x10 3/mm (ref 0.0–0.7)
Eosinophil %: 0.9 %
HCT: 34.8 % — AB (ref 35.0–47.0)
HGB: 11.8 g/dL — ABNORMAL LOW (ref 12.0–16.0)
Lymphocyte #: 0.6 x10 3/mm — ABNORMAL LOW (ref 1.0–3.6)
Lymphocyte %: 20.5 %
MCH: 36.9 pg — AB (ref 26.0–34.0)
MCHC: 33.9 g/dL (ref 32.0–36.0)
MCV: 109 fL — ABNORMAL HIGH (ref 80–100)
MONO ABS: 0.6 x10 3/mm (ref 0.2–0.9)
Monocyte %: 22.5 %
Neutrophil #: 1.5 x10 3/mm (ref 1.4–6.5)
Neutrophil %: 53.5 %
Platelet: 195 x10 3/mm (ref 150–440)
RBC: 3.19 10*6/uL — AB (ref 3.80–5.20)
RDW: 15.3 % — ABNORMAL HIGH (ref 11.5–14.5)
WBC: 2.8 x10 3/mm — AB (ref 3.6–11.0)

## 2013-08-10 LAB — COMPREHENSIVE METABOLIC PANEL
ALBUMIN: 2.7 g/dL — AB (ref 3.4–5.0)
ALT: 55 U/L (ref 12–78)
Alkaline Phosphatase: 86 U/L
Anion Gap: 7 (ref 7–16)
BUN: 12 mg/dL (ref 7–18)
Bilirubin,Total: 0.2 mg/dL (ref 0.2–1.0)
CHLORIDE: 107 mmol/L (ref 98–107)
CO2: 29 mmol/L (ref 21–32)
Calcium, Total: 8.5 mg/dL (ref 8.5–10.1)
Creatinine: 0.9 mg/dL (ref 0.60–1.30)
EGFR (African American): >60
EGFR (Non-African Amer.): >60
GLUCOSE: 145 mg/dL — AB (ref 65–99)
Osmolality: 287 (ref 275–301)
POTASSIUM: 3.7 mmol/L (ref 3.5–5.1)
SGOT(AST): 51 U/L — ABNORMAL HIGH (ref 15–37)
SODIUM: 143 mmol/L (ref 136–145)
TOTAL PROTEIN: 5.8 g/dL — AB (ref 6.4–8.2)

## 2013-08-29 ENCOUNTER — Ambulatory Visit: Payer: Self-pay | Admitting: Oncology

## 2013-08-31 LAB — CBC CANCER CENTER
BASOS ABS: 0 x10 3/mm (ref 0.0–0.1)
BASOS PCT: 1.2 %
EOS ABS: 0 x10 3/mm (ref 0.0–0.7)
Eosinophil %: 1.2 %
HCT: 36.7 % (ref 35.0–47.0)
HGB: 12.3 g/dL (ref 12.0–16.0)
LYMPHS PCT: 27.1 %
Lymphocyte #: 0.7 x10 3/mm — ABNORMAL LOW (ref 1.0–3.6)
MCH: 36.9 pg — ABNORMAL HIGH (ref 26.0–34.0)
MCHC: 33.6 g/dL (ref 32.0–36.0)
MCV: 110 fL — AB (ref 80–100)
Monocyte #: 0.6 x10 3/mm (ref 0.2–0.9)
Monocyte %: 23.8 %
NEUTROS PCT: 46.7 %
Neutrophil #: 1.2 x10 3/mm — ABNORMAL LOW (ref 1.4–6.5)
Platelet: 183 x10 3/mm (ref 150–440)
RBC: 3.35 10*6/uL — AB (ref 3.80–5.20)
RDW: 15.5 % — AB (ref 11.5–14.5)
WBC: 2.6 x10 3/mm — AB (ref 3.6–11.0)

## 2013-08-31 LAB — COMPREHENSIVE METABOLIC PANEL
ALT: 49 U/L
AST: 48 U/L — AB (ref 15–37)
Albumin: 2.8 g/dL — ABNORMAL LOW (ref 3.4–5.0)
Alkaline Phosphatase: 94 U/L
Anion Gap: 7 (ref 7–16)
BILIRUBIN TOTAL: 0.4 mg/dL (ref 0.2–1.0)
BUN: 11 mg/dL (ref 7–18)
CHLORIDE: 104 mmol/L (ref 98–107)
CO2: 29 mmol/L (ref 21–32)
Calcium, Total: 8.9 mg/dL (ref 8.5–10.1)
Creatinine: 0.92 mg/dL (ref 0.60–1.30)
EGFR (African American): >60
Glucose: 153 mg/dL — ABNORMAL HIGH (ref 65–99)
OSMOLALITY: 282 (ref 275–301)
Potassium: 4.1 mmol/L (ref 3.5–5.1)
Sodium: 140 mmol/L (ref 136–145)
Total Protein: 5.9 g/dL — ABNORMAL LOW (ref 6.4–8.2)

## 2013-09-01 LAB — CEA: CEA: 2.3 ng/mL (ref 0.0–4.7)

## 2013-09-29 ENCOUNTER — Ambulatory Visit: Payer: Self-pay | Admitting: Oncology

## 2013-10-29 ENCOUNTER — Ambulatory Visit: Payer: Self-pay | Admitting: Oncology

## 2013-11-03 ENCOUNTER — Ambulatory Visit: Payer: Self-pay | Admitting: Surgery

## 2013-11-03 LAB — COMPREHENSIVE METABOLIC PANEL
ALBUMIN: 3.2 g/dL — AB (ref 3.4–5.0)
Alkaline Phosphatase: 97 U/L
Anion Gap: 4 — ABNORMAL LOW (ref 7–16)
BUN: 12 mg/dL (ref 7–18)
Bilirubin,Total: 0.8 mg/dL (ref 0.2–1.0)
CHLORIDE: 105 mmol/L (ref 98–107)
CO2: 30 mmol/L (ref 21–32)
CREATININE: 0.78 mg/dL (ref 0.60–1.30)
Calcium, Total: 8.7 mg/dL (ref 8.5–10.1)
EGFR (Non-African Amer.): >60
Glucose: 92 mg/dL (ref 65–99)
Osmolality: 277 (ref 275–301)
Potassium: 4.3 mmol/L (ref 3.5–5.1)
SGOT(AST): 45 U/L — ABNORMAL HIGH (ref 15–37)
SGPT (ALT): 40 U/L
Sodium: 139 mmol/L (ref 136–145)
Total Protein: 6.6 g/dL (ref 6.4–8.2)

## 2013-11-03 LAB — CBC WITH DIFFERENTIAL/PLATELET
Basophil #: 0.1 10*3/uL (ref 0.0–0.1)
Basophil %: 1.1 %
EOS ABS: 0 10*3/uL (ref 0.0–0.7)
Eosinophil %: 0.6 %
HCT: 37.7 % (ref 35.0–47.0)
HGB: 12.6 g/dL (ref 12.0–16.0)
LYMPHS ABS: 1 10*3/uL (ref 1.0–3.6)
Lymphocyte %: 19.2 %
MCH: 36.9 pg — ABNORMAL HIGH (ref 26.0–34.0)
MCHC: 33.6 g/dL (ref 32.0–36.0)
MCV: 110 fL — ABNORMAL HIGH (ref 80–100)
MONO ABS: 0.6 x10 3/mm (ref 0.2–0.9)
MONOS PCT: 12.6 %
Neutrophil #: 3.4 10*3/uL (ref 1.4–6.5)
Neutrophil %: 66.5 %
Platelet: 173 10*3/uL (ref 150–440)
RBC: 3.43 10*6/uL — ABNORMAL LOW (ref 3.80–5.20)
RDW: 13.7 % (ref 11.5–14.5)
WBC: 5.2 10*3/uL (ref 3.6–11.0)

## 2013-11-10 ENCOUNTER — Inpatient Hospital Stay: Payer: Self-pay | Admitting: Surgery

## 2013-11-11 LAB — PATHOLOGY REPORT

## 2013-11-30 ENCOUNTER — Ambulatory Visit: Payer: Self-pay | Admitting: Oncology

## 2013-11-30 LAB — COMPREHENSIVE METABOLIC PANEL
ALBUMIN: 3 g/dL — AB (ref 3.4–5.0)
ALT: 39 U/L
AST: 37 U/L (ref 15–37)
Alkaline Phosphatase: 88 U/L
Anion Gap: 4 — ABNORMAL LOW (ref 7–16)
BUN: 14 mg/dL (ref 7–18)
Bilirubin,Total: 0.4 mg/dL (ref 0.2–1.0)
CO2: 31 mmol/L (ref 21–32)
Calcium, Total: 8.2 mg/dL — ABNORMAL LOW (ref 8.5–10.1)
Chloride: 105 mmol/L (ref 98–107)
Creatinine: 0.83 mg/dL (ref 0.60–1.30)
EGFR (African American): >60
EGFR (Non-African Amer.): >60
GLUCOSE: 103 mg/dL — AB (ref 65–99)
Osmolality: 280 (ref 275–301)
Potassium: 4 mmol/L (ref 3.5–5.1)
Sodium: 140 mmol/L (ref 136–145)
TOTAL PROTEIN: 5.9 g/dL — AB (ref 6.4–8.2)

## 2013-11-30 LAB — CBC CANCER CENTER
BASOS ABS: 0.1 x10 3/mm (ref 0.0–0.1)
Basophil %: 1 %
EOS ABS: 0 x10 3/mm (ref 0.0–0.7)
Eosinophil %: 0.7 %
HCT: 36.4 % (ref 35.0–47.0)
HGB: 12.1 g/dL (ref 12.0–16.0)
LYMPHS ABS: 0.8 x10 3/mm — AB (ref 1.0–3.6)
Lymphocyte %: 16.4 %
MCH: 35.6 pg — ABNORMAL HIGH (ref 26.0–34.0)
MCHC: 33.3 g/dL (ref 32.0–36.0)
MCV: 107 fL — ABNORMAL HIGH (ref 80–100)
Monocyte #: 0.6 x10 3/mm (ref 0.2–0.9)
Monocyte %: 11.8 %
NEUTROS ABS: 3.5 x10 3/mm (ref 1.4–6.5)
NEUTROS PCT: 70.1 %
Platelet: 224 x10 3/mm (ref 150–440)
RBC: 3.4 10*6/uL — ABNORMAL LOW (ref 3.80–5.20)
RDW: 12.8 % (ref 11.5–14.5)
WBC: 5 x10 3/mm (ref 3.6–11.0)

## 2013-12-01 ENCOUNTER — Ambulatory Visit: Payer: Self-pay | Admitting: Internal Medicine

## 2013-12-01 LAB — CEA: CEA: 1.6 ng/mL (ref 0.0–4.7)

## 2013-12-03 LAB — CREATININE, SERUM

## 2013-12-11 ENCOUNTER — Ambulatory Visit (INDEPENDENT_AMBULATORY_CARE_PROVIDER_SITE_OTHER): Payer: Medicare Other | Admitting: Podiatry

## 2013-12-11 VITALS — BP 132/64 | HR 74 | Resp 16

## 2013-12-11 DIAGNOSIS — L600 Ingrowing nail: Secondary | ICD-10-CM

## 2013-12-11 LAB — HM MAMMOGRAPHY: HM Mammogram: NEGATIVE

## 2013-12-11 NOTE — Patient Instructions (Signed)

## 2013-12-13 NOTE — Progress Notes (Signed)
Subjective:     [name], female   DOB: 02/03/1935, 78 y.o.   MRN: [id number]  HPIpatient points the hallux nail left stating that it has become increasingly sore thick and impossible for her to cut. She has tried to herself and no longer can accomplish this   Review of Systems  All other systems reviewed and are negative.      Objective:   Physical Exam  Constitutional: She is oriented to person, place, and time.  Cardiovascular: Intact distal pulses.   Musculoskeletal: Normal range of motion.  Neurological: She is oriented to person, place, and time.  Skin: Skin is warm.  Nursing note and vitals reviewed.  neurovascular status intact with muscle strength adequate and range of motion of the subtalar and midtarsal joint within normal limits. Patient is noted to have thick damaged hallux nail left that's painful when pressed and difficult for her to take care of and is noted to have good digital perfusion is well oriented 3 with no equinus condition noted     Assessment:     Damaged hallux nail left with pain and deformity    Plan:     H&P performed condition discussed. Infiltrated 60 mg Xylocaine Marcaine mixture

## 2013-12-14 DIAGNOSIS — L03039 Cellulitis of unspecified toe: Secondary | ICD-10-CM

## 2013-12-29 ENCOUNTER — Telehealth: Payer: Self-pay | Admitting: *Deleted

## 2013-12-29 ENCOUNTER — Ambulatory Visit: Payer: Self-pay | Admitting: Oncology

## 2013-12-29 NOTE — Telephone Encounter (Signed)
Pt wanted to know how long she is to soak her toe. i told pt she is to soak until it stops draining. Pt states she is going into the hospital for a few days and i told her to let them know at the hospital what she has been doing for her toe and they should help her out in the hospital. Pt understood.

## 2013-12-31 ENCOUNTER — Inpatient Hospital Stay: Payer: Self-pay | Admitting: Surgery

## 2014-01-01 LAB — PLATELET COUNT: PLATELETS: 172 10*3/uL (ref 150–440)

## 2014-01-05 ENCOUNTER — Ambulatory Visit: Payer: Self-pay | Admitting: Internal Medicine

## 2014-01-29 ENCOUNTER — Ambulatory Visit: Payer: Self-pay | Admitting: Oncology

## 2014-03-01 ENCOUNTER — Ambulatory Visit: Payer: Self-pay | Admitting: Oncology

## 2014-03-04 ENCOUNTER — Ambulatory Visit (INDEPENDENT_AMBULATORY_CARE_PROVIDER_SITE_OTHER): Payer: Medicare Other | Admitting: Internal Medicine

## 2014-03-04 ENCOUNTER — Encounter: Payer: Self-pay | Admitting: Internal Medicine

## 2014-03-04 VITALS — BP 122/60 | HR 71 | Temp 98.1°F | Ht 63.0 in | Wt 139.5 lb

## 2014-03-04 DIAGNOSIS — E7800 Pure hypercholesterolemia, unspecified: Secondary | ICD-10-CM | POA: Insufficient documentation

## 2014-03-04 DIAGNOSIS — C189 Malignant neoplasm of colon, unspecified: Secondary | ICD-10-CM

## 2014-03-04 DIAGNOSIS — I10 Essential (primary) hypertension: Secondary | ICD-10-CM | POA: Insufficient documentation

## 2014-03-04 DIAGNOSIS — G629 Polyneuropathy, unspecified: Secondary | ICD-10-CM

## 2014-03-04 DIAGNOSIS — N200 Calculus of kidney: Secondary | ICD-10-CM

## 2014-03-04 DIAGNOSIS — R0989 Other specified symptoms and signs involving the circulatory and respiratory systems: Secondary | ICD-10-CM

## 2014-03-04 DIAGNOSIS — R32 Unspecified urinary incontinence: Secondary | ICD-10-CM

## 2014-03-04 LAB — LIPID PANEL
CHOL/HDL RATIO: 2
CHOLESTEROL: 105 mg/dL (ref 0–200)
HDL: 42.4 mg/dL (ref >39.00)
LDL Cholesterol: 47 mg/dL (ref 0–99)
NONHDL: 62.6
Triglycerides: 80 mg/dL (ref 0.0–149.0)
VLDL: 16 mg/dL (ref 0.0–40.0)

## 2014-03-04 LAB — HEPATIC FUNCTION PANEL
ALBUMIN: 3.7 g/dL (ref 3.5–5.2)
ALT: 25 U/L (ref 0–35)
AST: 30 U/L (ref 0–37)
Alkaline Phosphatase: 73 U/L (ref 39–117)
Bilirubin, Direct: 0.2 mg/dL (ref 0.0–0.3)
Total Bilirubin: 0.7 mg/dL (ref 0.2–1.2)
Total Protein: 6.3 g/dL (ref 6.0–8.3)

## 2014-03-04 LAB — BASIC METABOLIC PANEL
BUN: 19 mg/dL (ref 6–23)
CO2: 30 mEq/L (ref 19–32)
Calcium: 9.4 mg/dL (ref 8.4–10.5)
Chloride: 105 mEq/L (ref 96–112)
Creatinine, Ser: 0.87 mg/dL (ref 0.40–1.20)
GFR: 67.2 mL/min (ref >60.00)
Glucose, Bld: 103 mg/dL — ABNORMAL HIGH (ref 70–99)
Potassium: 4.6 mEq/L (ref 3.5–5.1)
Sodium: 141 mEq/L (ref 135–145)

## 2014-03-04 LAB — TSH: TSH: 3.09 u[IU]/mL (ref 0.35–4.50)

## 2014-03-04 NOTE — Progress Notes (Signed)
Pre visit review using our clinic review tool, if applicable. No additional management support is needed unless otherwise documented below in the visit note. 

## 2014-03-05 ENCOUNTER — Encounter: Payer: Self-pay | Admitting: *Deleted

## 2014-03-07 ENCOUNTER — Encounter: Payer: Self-pay | Admitting: Internal Medicine

## 2014-03-07 DIAGNOSIS — R32 Unspecified urinary incontinence: Secondary | ICD-10-CM | POA: Insufficient documentation

## 2014-03-07 DIAGNOSIS — R0989 Other specified symptoms and signs involving the circulatory and respiratory systems: Secondary | ICD-10-CM | POA: Insufficient documentation

## 2014-03-07 DIAGNOSIS — G629 Polyneuropathy, unspecified: Secondary | ICD-10-CM | POA: Insufficient documentation

## 2014-03-07 DIAGNOSIS — N200 Calculus of kidney: Secondary | ICD-10-CM | POA: Insufficient documentation

## 2014-03-07 NOTE — Assessment & Plan Note (Signed)
Had on kidney stone.  Has done well since.  Follow.

## 2014-03-07 NOTE — Assessment & Plan Note (Signed)
On losartan.  Check met b.  Follow pressures.

## 2014-03-07 NOTE — Assessment & Plan Note (Signed)
Was treated with XRT and chemo.  Continues to f/u at the cancer center and with Dr Donella Stade.  Also sees Dr Tamala Julian.  Has ostomy.  Follow.

## 2014-03-07 NOTE — Assessment & Plan Note (Signed)
Left carotid bruit.  Check carotid ultrasound.

## 2014-03-07 NOTE — Progress Notes (Signed)
[name], female   DOB: [birth year], 79 y.o.   MRN: [id number]   Subjective:    [name], female    DOB: [birth year], 79 y.o.   MRN: [id number]  HPI  Patient here to establish care.  Has a history of colon cancer with resulting neuropathy after chemo.  Also has a history of hypertension, hypercholesterolemia and urinary incontinence.  Previously seeing Dr Ouida Sills.  Is on lovastatin for her cholesterol and losartan/hctz for her blood pressure.  Due labs.  Diagnosed with colon cancer in 09/2012.  Has colostomy.  Not reversible.  Has had problems with hernias since her surgery.  Saw Dr Tamala Julian.   S/p repair.  Doing well.  Some urinary incontinence.  Has numbness in her feet and fingers.  Fingers are some better.  On gabapentin.  Using thick pads because of urinary leakage.     Past Medical History  Diagnosis Date  . Hypertension   . Hypercholesteremia   . Colon cancer   . History of shingles     twice  . Urine incontinence 03/06/2013    h/o  . Nephrolithiasis   . Neuropathy   . Left carotid bruit     Outpatient Encounter Prescriptions as of 03/04/2014  Medication Sig  . calcium citrate-vitamin D (CITRACAL+D) 315-200 MG-UNIT per tablet Take 1 tablet by mouth 2 (two) times daily.  Marland Kitchen gabapentin (NEURONTIN) 100 MG capsule Take 100 mg by mouth 3 (three) times daily.   Marland Kitchen glucosamine-chondroitin 500-400 MG tablet Take 1 tablet by mouth 2 (two) times daily.  Marland Kitchen loratadine (CLARITIN) 10 MG tablet Take 10 mg by mouth daily.  Marland Kitchen losartan-hydrochlorothiazide (HYZAAR) 50-12.5 MG per tablet Take 1 tablet by mouth daily.  Marland Kitchen lovastatin (MEVACOR) 40 MG tablet Take 40 mg by mouth at bedtime.  . Multiple Vitamin (MULTI-VITAMINS) TABS Take by mouth.  Marland Kitchen aspirin EC 81 MG tablet Take 81 mg by mouth daily.  . [DISCONTINUED] Calcium-Phosphorus-Vitamin D (CALCIUM GUMMIES) [phone] MG-MG-UNIT CHEW Chew by mouth.    Review of Systems  Constitutional: Positive for fatigue. Negative  for unexpected weight change.  HENT: Negative for congestion, sinus pressure and sore throat.   Eyes: Negative for pain and visual disturbance.  Respiratory: Negative for cough, chest tightness and shortness of breath.   Cardiovascular: Negative for chest pain, palpitations and leg swelling.  Gastrointestinal: Negative for nausea, vomiting, abdominal pain, diarrhea and constipation.  Genitourinary: Negative for frequency and difficulty urinating.       Does have urinary incontinence.  Has to wear pads.    Musculoskeletal: Negative for back pain and joint swelling.  Skin: Negative for color change and rash.  Neurological: Positive for numbness (in both feet.  on gabapentin.  ). Negative for dizziness and headaches.  Hematological: Negative for adenopathy. Does not bruise/bleed easily.  Psychiatric/Behavioral: Negative for dysphoric mood and decreased concentration.       Objective:    Physical Exam  Constitutional: She appears well-developed and well-nourished. No distress.  HENT:  Nose: Nose normal.  Mouth/Throat: Oropharynx is clear and moist.  Eyes: Right eye exhibits no discharge. Left eye exhibits no discharge. No scleral icterus.  Neck: Neck supple. No thyromegaly present.  Cardiovascular: Normal rate and regular rhythm.   Left carotid bruit.   Pulmonary/Chest: Breath sounds normal. No respiratory distress. She has no wheezes.  Abdominal: Soft. Bowel sounds are normal. There is no tenderness.  Musculoskeletal: She exhibits no edema or tenderness.  Lymphadenopathy:    She has no  cervical adenopathy.  Skin: Skin is warm. No rash noted.  Psychiatric: She has a normal mood and affect. Her behavior is normal.    BP 122/60 mmHg  Pulse 71  Temp(Src) 98.1 F (36.7 C) (Oral)  Ht _0  (1.6 m)  Wt 139 lb 8 oz (63.277 kg)  BMI 24.72 kg/m2  SpO2 97% Wt Readings from Last 3 Encounters:  03/04/14 139 lb 8 oz (63.277 kg)  11/14/12 178 lb (80.74 kg)     Lab Results  Component  Value Date   GLUCOSE 103* 03/04/2014   CHOL 105 03/04/2014   TRIG 80.0 03/04/2014   HDL 42.40 03/04/2014   LDLCALC 47 03/04/2014   ALT 25 03/04/2014   AST 30 03/04/2014   NA 141 03/04/2014   K 4.6 03/04/2014   CL 105 03/04/2014   CREATININE 0.87 03/04/2014   BUN 19 03/04/2014   CO2 30 03/04/2014   TSH 3.09 03/04/2014       Assessment & Plan:   Problem List Items Addressed This Visit    Colon cancer    Was treated with XRT and chemo.  Continues to f/u at the cancer center and with Dr Donella Stade.  Also sees Dr Tamala Julian.  Has ostomy.  Follow.        Relevant Medications   aspirin EC 81 MG tablet   loratadine (CLARITIN) 10 MG tablet   Other Relevant Orders   Hepatic function panel (Completed)   TSH (Completed)   Essential hypertension    On losartan.  Check met b.  Follow pressures.        Relevant Medications   aspirin EC 81 MG tablet   Other Relevant Orders   Basic metabolic panel (Completed)   Hypercholesterolemia - Primary    On lovastatin.  Low cholesterol diet and exercise.  Follow lipid panel and liver function tests.        Relevant Medications   aspirin EC 81 MG tablet   Other Relevant Orders   Lipid panel (Completed)   Left carotid bruit    Left carotid bruit.  Check carotid ultrasound.        Relevant Orders   US Carotid Duplex Bilateral   Nephrolithiasis    Had on kidney stone.  Has done well since.  Follow.        Neuropathy    Problems after chemo.  On gabapentin.  Follow.        Urinary incontinence    Wears pads.  Discussed kaegel exercises.  Also discussed bladder training.  Follow.          I spent 45 minutes with the patient and more than 50% of the time was spent in consultation regarding the above.     Einar Pheasant, MD

## 2014-03-07 NOTE — Assessment & Plan Note (Signed)
Wears pads.  Discussed kaegel exercises.  Also discussed bladder training.  Follow.

## 2014-03-07 NOTE — Assessment & Plan Note (Signed)
Problems after chemo.  On gabapentin.  Follow.

## 2014-03-07 NOTE — Assessment & Plan Note (Signed)
On lovastatin.  Low cholesterol diet and exercise.  Follow lipid panel and liver function tests.   

## 2014-04-06 ENCOUNTER — Ambulatory Visit
Admit: 2014-04-06 | Disposition: A | Discharge status: Home / Self Care | Payer: Self-pay | Attending: Oncology | Admitting: Oncology

## 2014-04-30 ENCOUNTER — Ambulatory Visit
Admit: 2014-04-30 | Disposition: A | Discharge status: Home / Self Care | Payer: Self-pay | Attending: Oncology | Admitting: Oncology

## 2014-05-21 NOTE — Consult Note (Signed)
Reason for Visit: This 79 year old Female patient presents to the clinic for initial evaluation of  rectal cancer .   Referred by Dr. Oliva Bustard.  Diagnosis:  Chief Complaint/Diagnosis   79 year old female with stage III (T3, N1, M0) adenocarcinoma of the rectum for  preop chemoradiation  Pathology Report pathology report reviewed   Imaging Report CT scan and PET CT scan reviewed   Referral Report clinical notes reviewed   Planned Treatment Regimen neoadjuvant chemoradiation prior to surgical resection   HPI   patient is a pleasant 79 year old female who was found on lower endoscopy  after developing some slight rectal bleeding to have a rectal polyp. Followup was removed  and pathology showed focal invasive adenocarcinoma.arising tubovillous adenoma with high-grade dysplasia. She went on to have an endoscopic ultrasound showing T3 N1 lesion.she is seen today for consideration of neoadjuvant chemoradiation prior to surgical resection. She is doing well. She tends to have active bowels no significant diarrhea. She scheduled have a port placed next week.she has had a PET/CT scan showing some hypermetabolic activity focally in the distal rectum.  There also on CT some small perirectal nodes noted  Past Hx:    Hypertension:    Rectal Cancer:   Past, Family and Social History:  Past Medical History positive   Cardiovascular hypertension   Gastrointestinal history of colon polyps   Family History noncontributory   Social History noncontributory   Additional Past Medical and Surgical History aaccompanied by her daughter today   Allergies:   No Known Allergies:   Home Meds:  Home Medications: Medication Instructions Status  hydrochlorothiazide-losartan 12.5 mg-50 mg oral tablet 1 tab(s) orally once a day Active  lovastatin 40 mg oral tablet 1 tab(s) orally once a day Active   Review of Systems:  General negative   Performance Status (ECOG) 0   Skin negative   Breast  negative   Ophthalmologic negative   ENMT negative   Respiratory and Thorax negative   Cardiovascular negative   Gastrointestinal see HPI   Genitourinary negative   Musculoskeletal negative   Neurological negative   Psychiatric negative   Hematology/Lymphatics negative   Endocrine negative   Allergic/Immunologic negative   Nursing Notes:  Nursing Vital Signs and Chemo Nursing Nursing Notes: *CC Vital Signs Flowsheet:   15-Oct-14 10:18  Temp Temperature 98.4  Pulse Pulse 67  Respirations Respirations 20  SBP SBP 154  DBP DBP 81  Pain Scale (0-10)  0  Current Weight (kg) (kg) 80.9  Height (cm) centimeters 162.4  BSA (m2) 1.8   Physical Exam:  General/Skin/HEENT:  General normal   Skin normal   Eyes normal   ENMT normal   Head and Neck normal   Additional PE well-developed well-nourished female in NAD. Lungs are clear to A&P cardiac examination shows regular rate and rhythm. Abdomen is benign. No inguinal adenopathy is identified. On rectal exam rectal sphincter tone is good. There some scar tissue present in the anterior portion of the rectum hard to judge of this is clinically the active site of resection.   Breasts/Resp/CV/GI/GU:  Respiratory and Thorax normal   Cardiovascular normal   Gastrointestinal normal   Genitourinary normal   MS/Neuro/Psych/Lymph:  Musculoskeletal normal   Neurological normal   Lymphatics normal   Other Results:  Radiology Results: LabUnknown:    09-Oct-14 14:22, PET/CT Scan Colorectal Cancer Initial Stage  PACS Image   CT:    26-Jan-12 09:41, CT Abdomen and Pelvis Without Contrast  CT Abdomen and Pelvis  Without Contrast   REASON FOR EXAM:    Hematuria LUQ Abd Back Pain  Eval Kidney Stones  COMMENTS:       PROCEDURE: KCT - KCT ABDOMEN/PELVIS WO  - Feb 23 2010  9:41AM     RESULT: Indication: Hematuria    Comparison: None    Technique: Multiple axial images from the lungbases to the symphysis   pubis  were obtained without oral and without intravenous contrast.    Findings:    The lung bases are clear. There is no pleural or pericardial effusions.  No renal, ureteral, or bladder calculi. No obstructive uropathy. No   perinephric stranding is seen. The kidneys are symmetric in size without   evidence for exophytic mass. The bladder is unremarkable.    The liver demonstrates no focal abnormality. The gallbladder is   unremarkable. The spleen demonstrates no focal abnormality. The adrenal   glands and pancreas are normal.     The unopacified stomach, duodenum, small intestine, and large intestine   are unremarkable, but evaluation is limited by lack of oral contrast.   There is diverticulosis without evidence ofdiverticulitis. There is no   pneumoperitoneum, pneumatosis, or portal venous gas. There is no   abdominal or pelvic free fluid. There is no lymphadenopathy.     The abdominal aorta is normal in caliber with atherosclerosis.  There is lumbar spine spondylosis.    IMPRESSION:     1. No urolithiasis or obstructive uropathy.          Verified By: Jennette Banker, M.D., MD  Nuclear Med:    09-Oct-14 14:22, PET/CT Scan Colorectal Cancer Initial Stage  PET/CT Scan Colorectal Cancer Initial Stage   REASON FOR EXAM:    colon CA initial stage  COMMENTS:       PROCEDURE: PET - PET/CT INIT STAGING COLORECTAL  - Nov 06 2012  2:22PM     RESULT: Indication: Colon cancer  Radiopharmaceutical: 12.27 mCi F18-FDG, intravenously.    Technique: Imaging was performed from the skull base to the mid-thigh   using routine PET/CT acquisition protocol.    Injection site: Right hand  Time of FDG injection: 1258 hours  Serum glucose: 115 mg/dL   Time of imaging: 1359 hours  Comparison studies: NONE    Findings:    HEAD AND NECK:     There is no abnormal hypermetabolic activity in the head and neck. There   is no cervical soft tissue mass or lymphadenopathy.     CHEST:    There is  no abnormal hypermetabolic activity in the chest.    The lungs are clear. There is no focal parenchymal opacity, pleural   effusion, or pneumothorax.   The heart size is normal. There is no pericardial effusion.     There no pathologically enlarged mediastinal, hilar, or axillary lymph   nodes.     The osseous structures demonstrate no focal abnormality.     ABDOMEN/PELVIS:    The liver demonstrates no focal abnormality. The gallbladder is   unremarkable. The spleen demonstrates no focal abnormality. The kidneys,   adrenal glands, pancreas are normal. The bladder is unremarkable.     There is a focal area of hypermetabolic activity in the consistent with   patient's history of colon cancer. There are no other foci of abnormal     hypermetabolic activity within the colon. There is diverticulosis without   evidence of diverticulitis. There is no pneumoperitoneum, pneumatosis, or   portal venous gas. There  is no abdominal or pelvic free fluid. There is a   7 mm left posterior lateral perirectal lymph node.    The abdominal aorta is normal in caliber.    The osseous structures are unremarkable.    IMPRESSION:     1. There is a focal area of hypermetabolic activity in the consistent   with patient's history of colon cancer.     Dictation Site: 1    Verified By: Jennette Banker, M.D., MD   Relevent Results:   Relevant Scans and Labs CT scans and PET CT scans are reviewed   Assessment and Plan: Impression:   stage III adenocarcinoma of the rectum in 79 year old female Plan:   I discussed the case personally Dr. Oliva Bustard. Would recommend neoadjuvant chemoradiation. Would plan on delivering 4500 cGy to her pelvis boosting the area of hyper metabolic activity  up to 9357 cGy if all small bowel can be excluded from a small field boost fields.risks and benefits of treatment including potential for diarrhea, dysuria or urinary frequency, fatigue, alteration blood counts all were  explained in detail to both the patient and her daughter. They both seem to comprehend my treatment plan well. I have set her for CT simulation early next week after her Port-A-Cath has been placed. We'll coordinate her chemotherapy with Dr. Oliva Bustard Team  I would like to take this opportunity to thank you for allowing me to continue to participate in this patient's care..    CC Referral:  cc: Dr. Frazier Richards   Electronic Signatures: Baruch Gouty, Roda Shutters (MD)  (Signed 22-Oct-14 15:05)  Authored: HPI, Diagnosis, Past Hx, PFSH, Allergies, Home Meds, ROS, Nursing Notes, Physical Exam, Other Results, Relevent Results, Encounter Assessment and Plan, CC Referring Physician   Last Updated: 22-Oct-14 15:05 by Armstead Peaks (MD)

## 2014-05-21 NOTE — Op Note (Signed)
PATIENT NAME:  Christina Mathis, Christina Mathis MR#:  [id number] DATE OF BIRTH:  05-30-1935  DATE OF PROCEDURE:  11/18/2012  PREOPERATIVE DIAGNOSIS: Rectal cancer.   POSTOPERATIVE DIAGNOSIS: Rectal cancer.   PROCEDURE PERFORMED: Insertion of central venous catheter with subcutaneous infusion port.   SURGEON: Rochel Brome, M.D.   ANESTHESIA: Local 1% Xylocaine with monitored anesthesia care.   INDICATIONS: This 79 year old female recently had colonoscopy findings of cancer of the rectum. She is now needing central venous access for neoadjuvant chemotherapy.   DESCRIPTION OF PROCEDURE: The patient was placed on the operating table in the supine position under intravenous sedation. A rolled sheet was placed behind her shoulder blades. The neck was extended. The neck was turned 30 degrees to the left. The anterior neck and right subclavian areas were prepared with ChloraPrep and draped in a sterile manner.   The skin beneath the clavicle was infiltrated with 1% Xylocaine. A transversely oriented 3 cm incision was made, carried down through the subcutaneous tissues and a subcutaneous pouch was created inferior to the incision large enough to admit the Liborio Negrin Torres port.  Next, the jugular vein was identified with ultrasound, also identified the carotid artery and the thyroid. The skin overlying the jugular vein was infiltrated with 1% Xylocaine. A transversely oriented 5 mm incision was made, carried down through the subcutaneous tissues. Next, with the patient in the Trendelenburg position. The needle was inserted into the jugular vein using ultrasound guidance. An image was saved for the paper chart. The guidewire was passed down through the needle and the needle withdrawn. Next, fluoroscopy was used to demonstrate position of the guidewire in the vena cava. The dilator and introducer sheath were advanced over the guidewire. The guidewire and dilator were removed. The catheter was advanced through the sheath, and the sheath  was peeled away. The catheter was positioned 13 cm from the skin edge and used fluoroscopy to demonstrated the tip in the superior vena cava. A fluoroscopic image was saved for the paper chart. The catheter was tunneled to the subclavian port site and pressure was held over the tunnel site. The catheter was cut to fit and attached to the Hardin port using the accompanying sleeve to secure it. The port was accessed with a Huber needle, aspirated a trace of blood and flushed with heparinized saline solution. The port was positioned in the subcutaneous pocket and sutured to the surrounding fatty tissue with 4-0 silk. Subsequently, the pocket was closed with interrupted 5-0 Vicryl. Both incisions were closed with 5-0 Vicryl subcuticular suture and Dermabond. The patient tolerated surgery satisfactorily and was then prepared for transfer to the recovery room.    ____________________________ Lenna Sciara. Rochel Brome, MD [email or web address] D: 11/18/2012 15:54:38 ET T: 11/18/2012 16:12:04 ET JOB#: [id number]  cc: Loreli Dollar, MD, <Dictator> Loreli Dollar MD ELECTRONICALLY SIGNED 11/20/2012 22:01

## 2014-05-22 NOTE — Op Note (Signed)
PATIENT NAME:  Christina Mathis, Christina Mathis MR#:  [id number] DATE OF BIRTH:  1935/11/26  DATE OF PROCEDURE:  11/10/2013  PREOPERATIVE DIAGNOSIS: Ventral hernia, paracolostomy hernia.   POSTOPERATIVE DIAGNOSIS: Ventral hernia, paracolostomy hernia.   PROCEDURE: Ventral hernia repair with small bowel resection.   SURGEON: Rochel Brome, MD.   ANESTHESIA: General.   INDICATIONS: This 79 year old female with history of cancer of the rectum and abdominoperineal resection has developed bulging in the lower abdominal midline incision and a hernia was demonstrated on physical exam. She also had bulging superior and lateral to the colostomy and a paracolostomy hernia was identified on physical exam, repair was recommended for definitive treatment.   DESCRIPTION OF PROCEDURE: The patient was placed on the operating table in the supine position under general endotracheal anesthesia. The abdomen was prepared with Betadine around the colostomy site and ChloraPrep for the remainder of the abdominal wall. The colostomy was also isolated by placing benzoin on the skin and OpSite with a 1 inch border which was placed in 2 pieces which were perpendicular to one another to isolate the colostomy. The site was further draped with towels and sheets. A lower abdominal midline incision was made at the site of the old scar and immediately found that there was bowel densely attached to the skin, as there was some bilious drainage. Dissection was carried out isolating a portion of small bowel as the incision was lengthened several times. Found that bowel was densely adherent to the skin, and this was dissected away from the skin with blunt and sharp dissection and dissected down to the fascia. A portion of hernia sac was identified and was incised for further exposure of the small bowel. The direction of the small bowel was identified and the edges of the opening of the bowel were picked up with Allis clamps and a TA-30 stapler was placed  across the cut edges and activated; however, this repair leaked and was not satisfactory. Additional small bowel was dissected free from surrounding structures and elevated further up into the wound and was dissected circumferentially away from the abdominal wall. It appeared that there was an approximately 4 cm segment of small bowel that will need to be resected as it was enough injury from scarring and dissection that it may not heal without a resection. The mesentery was divided with electrocautery and 2 clamped vessels were ligated with 3-0 chromic. The proximal and distal portions of bowel were brought together and a proximal and distal enterotomy was made and the anastomosis was begun with insertion of the 75 mm GIA stapler to begin the anastomosis along the antimesenteric border. The staple line was hemostatic. The anastomosis was completed with another application of the GIA.  SEE REMAINDER OF DICTATION   ____________________________ J. Rochel Brome, MD jws:at D: 11/10/2013 10:33:50 ET T: 11/10/2013 10:48:53 ET JOB#: [id number]  cc: Loreli Dollar, MD, <Dictator> Loreli Dollar MD ELECTRONICALLY SIGNED 11/10/2013 19:01

## 2014-05-22 NOTE — Discharge Summary (Signed)
PATIENT NAME:  Christina Mathis, Christina Mathis MR#:  [id number] DATE OF BIRTH:  November 11, 1935  DATE OF ADMISSION:  03/06/2013 DATE OF DISCHARGE:  03/20/2013  HISTORY OF PRESENT ILLNESS: This 79 year old female has a history of cancer of the rectum T3, N1 MX. She had neoadjuvant chemotherapy and radiation. Does have Port-A-Cath on the right side, came in through the outpatient surgery department for abdominoperineal resection as definitive surgical procedure.   PAST MEDICAL HISTORY:  1. Does include some elevation of her blood sugar.  2. Hypertension.  3. Hypercholesterolemia.   MEDICATIONS: Include Hyzaar 50/12.5 daily, lovastatin 40 mg daily, citracal plus D.  On physical abdomen was soft and nontender. Rectal exam demonstrated the presence of remaining palpable mass in the distal rectum.   She did have bowel preparation at home and was brought in through the outpatient surgery department, carried to the Operating Room where she had abdominoperineal resection and did have a preop prophylactic antibiotic, was treated with prophylactic subcutaneous heparin during the course of her hospital stay.   She did receive IV fluids and analgesics postop. Was initially begun on a clear liquid diet and gradually advanced. She did have numerous episodes of vomiting in small amounts, and we did  go slow with her diet, and this did prolong her hospital stay. Did also have some serous drainage from her wound, which appeared to be self-limited and did also extend her hospital stay to be able to observe her wound. Her colostomy appeared to progress satisfactorily, and did have satisfactory feculent drainage from her colostomy.  At that the time of discharge, both perineal and abdominal wounds were dry and intact and colostomy progressing satisfactorily.   Her final pathology demonstrated a tumor remission with remaining tubulovillous adenoma and changes consistent with chemoradiation. There was one enlarged lymph node with necrosis,  but the 17 lymph nodes examined were negative for cancer.   DIAGNOSIS: Carcinoma of the rectum.   OPERATION: An abdominoperineal resection.   DISCHARGE INSTRUCTIONS: Wound care instructions given and has been seen by colostomy nurse. Have made plans for home health care nursing follow-up for additional colostomy teaching until comfort level is reached and plans made for follow-up in the office, and also anticipating subsequent oncology consultation. ____________________________ Lenna Sciara. Rochel Brome, MD jws:sg D: 04/02/2013 13:47:01 ET T: 04/02/2013 13:57:36 ET JOB#: [id number]  cc: Loreli Dollar, MD, <Dictator> Loreli Dollar MD ELECTRONICALLY SIGNED 04/05/2013 12:37

## 2014-05-22 NOTE — Discharge Summary (Signed)
PATIENT NAME:  Christina Mathis, Christina Mathis MR#:  [id number] DATE OF BIRTH:  Jul 06, 1935  DATE OF ADMISSION:  11/10/2013 DATE OF DISCHARGE:  11/14/2013  HISTORY OF PRESENT ILLNESS:  This 79 year old female has a history of rectal cancer and abdominoperineal resection and has had a course of preoperative radiation therapy and preoperative and postoperative chemotherapy and has developed a ventral hernia in the lower abdominal midline incision and also a paracolostomy hernia. Repair was recommended for definitive treatment.   Other details are recorded on the typed [facility] [place]:  She came in through the outpatient surgery department and was carried to the operating room, where she had a preoperative dose of intravenous Cefotan.   At the time of lower abdominal midline incision, it was found that there was extensive adhesion between the skin and the small bowel necessitating resection of a 2 inch segment of small bowel with repair of the ventral hernia. We elected not to repair the paracolostomy hernia at the same session. Postoperatively, she was treated with IV fluids and initially kept n.p.o. but later started on a clear liquid diet, which she tolerated well and was advanced to full liquids and eventually advanced to solid food. She did demonstrate colostomy function prior to discharge. Her wound progressed satisfactorily.   FINAL DIAGNOSIS:  Ventral hernia, paracolostomy hernia, history of rectal cancer.   OPERATION:  Ventral hernia repair with segmental small bowel resection.   DISCHARGE INSTRUCTIONS:  Wound care instructions were given and plans made for followup in the office.    ____________________________ J. Rochel Brome, MD jws:nb D: 11/23/2013 15:14:49 ET T: 11/23/2013 23:44:56 ET JOB#: [id number]  cc: Loreli Dollar, MD, <Dictator> Loreli Dollar MD ELECTRONICALLY SIGNED 11/24/2013 16:31

## 2014-05-22 NOTE — Op Note (Signed)
PATIENT NAME:  Christina Mathis, Christina Mathis MR#:  [id number] DATE OF BIRTH:  06-Apr-1935  DATE OF PROCEDURE:  03/06/2013   PREOPERATIVE DIAGNOSIS: Carcinoma of the rectum.   POSTOPERATIVE DIAGNOSIS: Carcinoma of the rectum.  PROCEDURE:  Abdominoperineal resection.   SURGEON: Rochel Brome, MD  ASSISTANT:  Dr. Jamal Collin    INDICATIONS: This 79 year old female had a colonoscopy with findings of a cancer of the posterior aspect of the distal rectum. She has had preoperative chemotherapy and radiation, now comes in for definitive surgical procedure.   The patient did have bowel preparation at home.  PROCEDURE DETAILS:  The patient was placed on the operating table in the supine position under general endotracheal anesthesia. The legs were elevated into the lithotomy position using bumblebee stirrups. The circulating nurse inserted a Foley urinary catheter with Betadine preparation of the perineum draining a clear yellow urine. The catheter was carried anterior to the thigh.  Next, the anal area was examined. A digital exam was done, could palpate the mass which is posterior aspect of the rectum, oriented slightly to the right of the midline just above the anal sphincter. A small amount of feculent material came out. Next, the anal area and perineal area were further prepared with Betadine solution. The abdomen was prepared with ChloraPrep. It is noted that there was an ink mark placed midway between the umbilicus and the anterior/superior iliac spine which had been placed by the ostomy nurse to mark the ideal place for colostomy. The abdomen was draped out in a sterile manner.   A lower abdominal midline incision was made from the umbilicus to the pubic symphysis. This was the site of an old C-section scar. The dissection was carried down through the subcutaneous tissue. Several small bleeding points were cauterized. There was a trace of edema in the abdominal wall. The midline fascia was incised and the peritoneal  cavity is opened. Initial inspection revealed there was no palpable mass within the liver and the uterus appeared normal size. The ovaries were atrophic. Fimbria appeared typical. Small bowel is packed out of the pelvis with moist lap packs. The sigmoid colon was identified and grossly appeared normal. A site for division of the sigmoid colon was selected at approximately the junction of the middle third and distal third of the sigmoid colon and a window was created in the mesentery. The GIA 55 stapler was placed across the bowel and activated, dividing the bowel.  Next, dissection of the mesentery was begun with a Harmonic scalpel, carried down towards the sacral promontory and ligated the inferior mesenteric artery and vein with 0 chromic and also divided with a Harmonic scalpel. The ureters were identified on both sides and mesenteric dissection was continued down into the pelvis removing mesentery from the hollow of the sacrum and dissecting circumferentially around the rectum. The vagina was dissected free from the rectum. There was a small opening made in the vagina which was repaired with 0 chromic figure-of-eight suture. Traction was applied to this for further exposure. There was some profuse bleeding from some superior hemorrhoidal veins, particularly on the left side, and there was one large Hemoclip which was applied and also there was another point that was suture ligated with 0 chromic and a significant portion of the blood loss for the procedure was from this location, but subsequently hemostasis was intact. The dissection was continued down to identify the coccyx and I dissected further between the rectum and the vagina, and could begin to feel the tumor in the  posterior aspect of the rectum.  Next, the perineal dissection was begun. The 3-0 nylon pursestring suture was placed at the anal orifice. The site was further prepared with Betadine solution and then made a longitudinally oriented  elliptical excision around the anus and dissected through subcutaneous tissues and continued the dissection circumferentially around the anus using Harmonic scalpel and incised the pelvic diaphragm and subsequently removed the rectum and sigmoid colon and passed off to a side table. The wound was inspected and subsequently determined hemostasis was intact. The pelvic diaphragm was closed with interrupted 0 chromic and a 19-French Blake drain was inserted up through a remaining small opening in the pelvic diaphragm. It was cut to fit and was brought out through a separate stab wound of the left buttock and secured to the skin with 3-0 nylon.  Next, additional subcutaneous tissues were approximated with 0 chromic and the skin was closed with interrupted 3-0 nylon vertical mattress sutures.  I changed gown and gloves. Dr. Jamal Collin had begun the closure of the perineum in the pelvic floor and then I completed this. This was done with 0 chromic running sutures.  Next, all lap packs were removed. It appeared that hemostasis was intact. The distal portion of the remaining bowel was examined. A small amount of fatty tissue was dissected away for exposure of the bowel wall.  Next, a circular incision was made in the skin midway between the umbilicus and the anterior/superior iliac spine at the previously placed ink mark. Next, an incision was made in the subcutaneous fat and dissected down to the anterior rectus sheath where a cruciate incision was made and retracted the rectus muscle fibers to see the posterior sheath where a cruciate incision was made and the peritoneum was opened. Two fingers were advanced up through this and appeared to be adequate size. The distal bowel was examined and could see that it was not twisted. It was brought up through the abdominal wall with a Babcock clamp and would come up approximately an inch past the skin and was held in place with Allis clamps placed to the staple line.  Next,  gown, gloves and instruments were exchanged for clean ones. Additional towels were placed around the incision and the midline fascia was repaired. It is noted a small portion of omentum could be brought beneath the upper end of the incision. The fascia was repaired with interrupted 0 Maxon figure-of-eight sutures, and the skin was closed with clips.  Next, the colostomy was matured by excising the staple line with electrocautery and the colostomy was sewn with 5-0 Vicryl suturing the skin to the seromuscular coat of the bowel and then the full thickness to create a rosette and the bowel looked good and the color was typical. Next, benzoin was applied to the skin and an ostomy wafer was cut to fit and attached to the skin and a bag attached. Dressings were applied to the lower abdominal midline incision with 2 inch paper tape. Also dressings were applied to the perineal wound. The Blake drain was activated draining some serosanguineous fluid. Mesh pants were applied. The Foley catheter was left in place.  Estimated blood loss for the operation was 500 mL. The patient tolerated surgery satisfactorily and was prepared for transfer to the recovery room.   ____________________________ Lenna Sciara. Rochel Brome, MD jws:ce D: 03/06/2013 17:14:24 ET T: 03/06/2013 20:12:02 ET JOB#: [id number]  cc: Loreli Dollar, MD, <Dictator> Loreli Dollar MD ELECTRONICALLY SIGNED 03/10/2013 20:00

## 2014-05-22 NOTE — Op Note (Signed)
PATIENT NAME:  Christina Mathis, Christina Mathis MR#:  [id number] DATE OF BIRTH:  21-Aug-1935  DATE OF PROCEDURE:  12/31/2013  PREOPERATIVE DIAGNOSIS: Paracolostomy ventral hernia.   POSTOPERATIVE DIAGNOSIS: Paracolostomy ventral hernia.   PROCEDURE: Repair of paracolostomy ventral hernia.   SURGEON: Loreli Dollar, MD   ANESTHESIA: General.   INDICATIONS: This 79 year old has a history of rectal cancer and abdominoperineal resection with terminal and sigmoid colostomy, who recently has been having significant bulging around the stoma, particularly superior and lateral to the stoma, and a paracolostomy hernia was demonstrated on physical exam. Repair was recommended for definitive treatment.   DESCRIPTION OF PROCEDURE: The patient was placed on the operating table in the supine position under general endotracheal anesthesia. The colostomy bag was removed from the left abdomen, and a small amount of feces was removed. Next, the skin surrounding the stoma and the stoma were cleaned with Betadine solution. Subsequently, the skin for a wider area to the edge of the stoma was cleaned with chlorhexidine, and this was allowed to dry. The ink marks were placed on the skin from approximately 12 o'clock to 9 o'clock positions, some 3 cm from the stoma. The skin adjacent to the stoma was treated with benzoin, and an adhesive barrier was placed on the skin. The site was draped with sterile towels and sheets.   A curvilinear incision was made from the 12 o'clock to the 9 o'clock position, some 3 cm from the border of the stoma. This was carried down through subcutaneous tissues to encounter a paracolostomy ventral hernia sac. The sac was dissected free from surrounding structures, and was separated from the fascial ring defect with electrocautery. The colon leading to the stoma was examined, and a number of adhesions were lysed. The peritoneum was peeled away from the fascia, and was closed over the colon with running 3-0 chromic.  Next, a Bard soft mesh was cut to create a crescent shape of approximately 2.8 x 4 cm, and was placed into the properitoneal plane. It was sutured to the overlying fascia with through and through 0 Surgilon sutures. Next, the fascia was closed over the mesh with interrupted 0 Surgilon. It is noted that the suture closest to the stoma was also reinforced with pledgets with a horizontal mattress suture. The repair looked good. Hemostasis was intact. Subcutaneous tissues were infiltrated with 0.5% Sensorcaine with epinephrine. The subcutaneous tissues were closed with interrupted 4-0 chromic. The skin was closed with running 4-0 Monocryl subcuticular suture, and LiquiBand. Next, the barrier drape was removed. The skin adjacent to the stoma was treated with additional benzoin, and the stoma bag was cut to fit and attached.   The patient appeared to be in satisfactory condition and was prepared for transfer to the recovery room.    ____________________________ Lenna Sciara. Rochel Brome, MD jws:MT D: 12/31/2013 15:29:14 ET T: 12/31/2013 15:57:43 ET JOB#: [id number]  cc: Loreli Dollar, MD, <Dictator> Loreli Dollar MD ELECTRONICALLY SIGNED 01/05/2014 8:55

## 2014-05-22 NOTE — Discharge Summary (Signed)
PATIENT NAME:  Christina Mathis, Christina Mathis MR#:  [id number] DATE OF BIRTH:  [birth year]  DATE OF ADMISSION:  12/31/2013 DATE OF DISCHARGE:  01/03/2014  HISTORY OF PRESENT ILLNESS: This 79 year old female came in through the outpatient surgery department for repair of a paracolostomy hernia. She has a history of rectal cancer and abdominoperineal resection and recently has been having bulging at this site of her colostomy.   Details of her past medical history are recorded on the typed [facility] [place]: She came in through the outpatient surgery department and was carried to the operating room, did have a preop prophylactic dose of Cefotan. She had her repair with an incision which is just above and lateral to the ostomy. She had repair of the paracolostomy hernia. I did use properitoneal prosthetic mesh. Postoperatively, she was kept in the hospital for a period of observation. She was initially begun on a clear liquid diet and gradually advanced and tolerated it satisfactorily. She was kept here until bowel activity was demonstrated. She had a minimal amount of postoperative pain.   FINAL DIAGNOSIS: Paracolostomy hernia.  DISCHARGE INSTRUCTIONS: Discharge instructions were given, may shower, continue usual medicines, and plans were made for followup in the office.   ____________________________ J. Rochel Brome, MD jws:ST D: 01/08/2014 19:12:38 ET T: 01/09/2014 01:17:21 ET JOB#: [id number]  cc: Loreli Dollar, MD, <Dictator> Loreli Dollar MD ELECTRONICALLY SIGNED 01/11/2014 18:10

## 2014-05-22 NOTE — Op Note (Signed)
PATIENT NAME:  Christina Mathis, Christina Mathis MR#:  [id number] DATE OF BIRTH:  [birth year]  DATE OF PROCEDURE:  11/10/2013  ADDENDUM (CONTINUATION OF DICTATION)  The anastomosis was completed with application of the GIA 75 stapler, which was placed perpendicular to the first, engaged and activated, and separated an approximately 2 inch segment of small bowel, which was submitted in formalin for routine pathology. The anastomoses was inspected and appeared to be intact. The mesenteric defect was very small.    Next, attention was turned back to the repair. The hernia sac was dissected free from surrounding structures and was resected just below the fascia. The integrity of the fascia appeared good in that it was thick and strong. The defect was approximately 5 cm in length and was repaired. I elected not to use any mesh due to the fact there was contamination during the procedure, and repaired the fascia with a longitudinally oriented suture line of interrupted 0 Maxon figure-of-eight sutures. The wound was inspected and several small bleeding points were cauterized. The wound was irrigated with saline solution. Next, the skin edges were loosely approximated with interrupted 4-0 nylon vertical mattress sutures placing these approximately 2 cm apart to allow for drainage.  With the contamination and small bowel obstruction, I elected to delay the paracolostomy hernia repair until another day.   The patient appeared to be in satisfactory condition and the dressings were applied using 4 x 4 gauze, and also a colostomy bag was applied using benzoin, and a wafer and bag.   The patient was, subsequently, prepared for transfer to the recovery room.   ____________________________ Lenna Sciara. Rochel Brome, MD jws:JT D: 11/10/2013 10:37:07 ET T: 11/10/2013 10:44:46 ET JOB#: [id number]  cc: Loreli Dollar, MD, <Dictator> Loreli Dollar MD ELECTRONICALLY SIGNED 11/10/2013 19:01

## 2014-06-09 ENCOUNTER — Encounter: Payer: Self-pay | Admitting: Internal Medicine

## 2014-06-09 ENCOUNTER — Other Ambulatory Visit (HOSPITAL_COMMUNITY)
Admission: RE | Admit: 2014-06-09 | Discharge: 2014-06-09 | Disposition: A | Discharge status: Home / Self Care | Payer: Medicare Other | Source: Ambulatory Visit | Attending: Internal Medicine | Admitting: Internal Medicine

## 2014-06-09 ENCOUNTER — Ambulatory Visit (INDEPENDENT_AMBULATORY_CARE_PROVIDER_SITE_OTHER): Payer: Medicare Other | Admitting: Internal Medicine

## 2014-06-09 VITALS — BP 118/66 | HR 64 | Temp 97.8°F | Ht 63.0 in | Wt 151.2 lb

## 2014-06-09 DIAGNOSIS — I10 Essential (primary) hypertension: Secondary | ICD-10-CM | POA: Diagnosis not present

## 2014-06-09 DIAGNOSIS — Z1151 Encounter for screening for human papillomavirus (HPV): Secondary | ICD-10-CM | POA: Insufficient documentation

## 2014-06-09 DIAGNOSIS — R32 Unspecified urinary incontinence: Secondary | ICD-10-CM

## 2014-06-09 DIAGNOSIS — Z124 Encounter for screening for malignant neoplasm of cervix: Secondary | ICD-10-CM

## 2014-06-09 DIAGNOSIS — E7800 Pure hypercholesterolemia, unspecified: Secondary | ICD-10-CM

## 2014-06-09 DIAGNOSIS — G629 Polyneuropathy, unspecified: Secondary | ICD-10-CM

## 2014-06-09 DIAGNOSIS — C189 Malignant neoplasm of colon, unspecified: Secondary | ICD-10-CM | POA: Diagnosis not present

## 2014-06-09 DIAGNOSIS — Z0000 Encounter for general adult medical examination without abnormal findings: Secondary | ICD-10-CM

## 2014-06-09 NOTE — Progress Notes (Signed)
Pre visit review using our clinic review tool, if applicable. No additional management support is needed unless otherwise documented below in the visit note. 

## 2014-06-09 NOTE — Progress Notes (Signed)
[name], female   DOB: [birth year], 79 y.o.   MRN: [id number]   Subjective:    [name], female    DOB: 1935-03-25, 79 y.o.   MRN: [id number]  HPI  Patient here to follow up on her current medical issues as well as for a physical exam.  Reports no cardiac symptoms with increased activity or exertion.  Breathing stable.  No increased cough or congestion.  Bowels stable.  Increased pain - toes - bunion second toe left.  Had bone density.  Unsure of date.  Had at [facility].     Past Medical History  Diagnosis Date  . Hypertension   . Hypercholesteremia   . Colon cancer   . History of shingles     twice  . Urine incontinence 03/06/2013    h/o  . Nephrolithiasis   . Neuropathy   . Left carotid bruit     Current Outpatient Prescriptions on File Prior to Visit  Medication Sig Dispense Refill  . aspirin EC 81 MG tablet Take 81 mg by mouth daily.    . calcium citrate-vitamin D (CITRACAL+D) 315-200 MG-UNIT per tablet Take 1 tablet by mouth 2 (two) times daily.    Marland Kitchen gabapentin (NEURONTIN) 100 MG capsule Take 100 mg by mouth 2 (two) times daily.     Marland Kitchen glucosamine-chondroitin 500-400 MG tablet Take 1 tablet by mouth 2 (two) times daily.    Marland Kitchen loratadine (CLARITIN) 10 MG tablet Take 10 mg by mouth daily as needed.     Marland Kitchen losartan-hydrochlorothiazide (HYZAAR) 50-12.5 MG per tablet Take 1 tablet by mouth daily.    . Multiple Vitamin (MULTI-VITAMINS) TABS Take by mouth.     No current facility-administered medications on file prior to visit.    Review of Systems  Constitutional: Negative for appetite change and unexpected weight change.  HENT: Negative for congestion, sinus pressure and sore throat.   Eyes: Negative for pain and visual disturbance.  Respiratory: Negative for cough, chest tightness and shortness of breath.   Cardiovascular: Negative for chest pain, palpitations and leg swelling.  Gastrointestinal: Negative for nausea, vomiting, abdominal pain  and diarrhea.  Genitourinary: Negative for dysuria and difficulty urinating.  Musculoskeletal: Negative for back pain and joint swelling.       Toe pain as outlined.   Skin: Negative for color change and rash.  Neurological: Negative for dizziness, light-headedness and headaches.  Hematological: Negative for adenopathy. Does not bruise/bleed easily.  Psychiatric/Behavioral: Negative for dysphoric mood and agitation.       Objective:     Blood pressure recheck:  136/68  Physical Exam  Constitutional: She is oriented to person, place, and time. She appears well-developed and well-nourished.  HENT:  Nose: Nose normal.  Mouth/Throat: Oropharynx is clear and moist.  Eyes: Right eye exhibits no discharge. Left eye exhibits no discharge. No scleral icterus.  Neck: Neck supple. No thyromegaly present.  Cardiovascular: Normal rate and regular rhythm.   Pulmonary/Chest: Breath sounds normal. No accessory muscle usage. No tachypnea. No respiratory distress. She has no decreased breath sounds. She has no wheezes. She has no rhonchi. Right breast exhibits no inverted nipple, no mass, no nipple discharge and no tenderness (no axillary adenopathy). Left breast exhibits no inverted nipple, no mass, no nipple discharge and no tenderness (no axilarry adenopathy).  Abdominal: Soft. Bowel sounds are normal. There is no tenderness.  Genitourinary:  Normal external genitalia.  Vaginal vault without lesions.  Cervix identified.  Pap smear performed.  Could  not appreciate any adnexal masses or tenderness.    Musculoskeletal: She exhibits no edema or tenderness.  Lymphadenopathy:    She has no cervical adenopathy.  Neurological: She is alert and oriented to person, place, and time.  Skin: Skin is warm. No rash noted.  Psychiatric: She has a normal mood and affect. Her behavior is normal.    BP 118/66 mmHg  Pulse 64  Temp(Src) 97.8 F (36.6 C) (Oral)  Ht _0  (1.6 m)  Wt 151 lb 4 oz (68.607 kg)  BMI  26.80 kg/m2  SpO2 98% Wt Readings from Last 3 Encounters:  06/09/14 151 lb 4 oz (68.607 kg)  02/12/14 138 lb 7.2 oz (62.8 kg)  03/04/14 139 lb 8 oz (63.277 kg)     Lab Results  Component Value Date   WBC 5.0 11/30/2013   HGB 12.1 11/30/2013   HCT 36.4 11/30/2013   PLT 172 01/01/2014   GLUCOSE 103* 03/04/2014   CHOL 105 03/04/2014   TRIG 80.0 03/04/2014   HDL 42.40 03/04/2014   LDLCALC 47 03/04/2014   ALT 25 03/04/2014   AST 30 03/04/2014   NA 141 03/04/2014   K 4.6 03/04/2014   CL 105 03/04/2014   CREATININE 0.87 03/04/2014   BUN 19 03/04/2014   CO2 30 03/04/2014   TSH 3.09 03/04/2014   INR 1.1 10/21/2012       Assessment & Plan:   Problem List Items Addressed This Visit    Colon cancer    Was treated with XRT and chemo.  Continues f/u at the cancer center.  Sees Dr Tamala Julian.  Has ostomy.  Working well.  Follow.       Essential hypertension    Blood pressure under good control.  Follow pressures.  Continue same medication regimen.  Follow met b.       Relevant Medications   lovastatin (MEVACOR) 20 MG tablet   Other Relevant Orders   Basic metabolic panel   Health care maintenance    Physical 06/09/14.  PAP 06/09/14.  Obtain bone density results.  Mammogram 10/2013.        Hypercholesterolemia    Low cholesterol diet and exercise.  Follow lipid panel and liver function tests.  On lovastatin.        Relevant Medications   lovastatin (MEVACOR) 20 MG tablet   Other Relevant Orders   Lipid panel   Hepatic function panel   Neuropathy    S/p chemo.  On gabapentin. Follow.        Urinary incontinence    Wears pads.  Follow.  Will notify me if desires any further w/up.         Other Visit Diagnoses    Pap smear for cervical cancer screening    -  Primary    Relevant Orders    Cytology - PAP (Completed)      I spent 25 minutes with the patient and more than 50% of the time was spent in consultation regarding the above.     Einar Pheasant, MD

## 2014-06-10 ENCOUNTER — Encounter: Payer: Self-pay | Admitting: *Deleted

## 2014-06-10 LAB — CYTOLOGY - PAP

## 2014-06-13 ENCOUNTER — Encounter: Payer: Self-pay | Admitting: Internal Medicine

## 2014-06-13 DIAGNOSIS — Z0000 Encounter for general adult medical examination without abnormal findings: Secondary | ICD-10-CM | POA: Insufficient documentation

## 2014-06-13 NOTE — Assessment & Plan Note (Signed)
Blood pressure under good control.  Follow pressures.  Continue same medication regimen.  Follow met b.

## 2014-06-13 NOTE — Assessment & Plan Note (Signed)
Wears pads.  Follow.  Will notify me if desires any further w/up.

## 2014-06-13 NOTE — Assessment & Plan Note (Signed)
Physical 06/09/14.  PAP 06/09/14.  Obtain bone density results.  Mammogram 10/2013.

## 2014-06-13 NOTE — Assessment & Plan Note (Signed)
S/p chemo.  On gabapentin. Follow.

## 2014-06-13 NOTE — Assessment & Plan Note (Signed)
Low cholesterol diet and exercise.  Follow lipid panel and liver function tests.  On lovastatin.   

## 2014-06-13 NOTE — Assessment & Plan Note (Signed)
Was treated with XRT and chemo.  Continues f/u at the cancer center.  Sees Dr Tamala Julian.  Has ostomy.  Working well.  Follow.

## 2014-06-18 ENCOUNTER — Other Ambulatory Visit: Payer: Self-pay | Admitting: *Deleted

## 2014-06-18 ENCOUNTER — Other Ambulatory Visit (INDEPENDENT_AMBULATORY_CARE_PROVIDER_SITE_OTHER): Payer: Medicare Other

## 2014-06-18 DIAGNOSIS — I10 Essential (primary) hypertension: Secondary | ICD-10-CM | POA: Diagnosis not present

## 2014-06-18 DIAGNOSIS — E7800 Pure hypercholesterolemia, unspecified: Secondary | ICD-10-CM

## 2014-06-18 DIAGNOSIS — C189 Malignant neoplasm of colon, unspecified: Secondary | ICD-10-CM

## 2014-06-18 LAB — HEPATIC FUNCTION PANEL
ALBUMIN: 3.7 g/dL (ref 3.5–5.2)
ALK PHOS: 65 U/L (ref 39–117)
ALT: 25 U/L (ref 0–35)
AST: 31 U/L (ref 0–37)
BILIRUBIN TOTAL: 0.6 mg/dL (ref 0.2–1.2)
Bilirubin, Direct: 0.1 mg/dL (ref 0.0–0.3)
Total Protein: 6.5 g/dL (ref 6.0–8.3)

## 2014-06-18 LAB — LIPID PANEL
CHOL/HDL RATIO: 2
Cholesterol: 102 mg/dL (ref 0–200)
HDL: 43.5 mg/dL (ref >39.00)
LDL Cholesterol: 48 mg/dL (ref 0–99)
NonHDL: 58.5
Triglycerides: 52 mg/dL (ref 0.0–149.0)
VLDL: 10.4 mg/dL (ref 0.0–40.0)

## 2014-06-18 LAB — BASIC METABOLIC PANEL
BUN: 19 mg/dL (ref 6–23)
CO2: 33 mEq/L — ABNORMAL HIGH (ref 19–32)
Calcium: 9.3 mg/dL (ref 8.4–10.5)
Chloride: 105 mEq/L (ref 96–112)
Creatinine, Ser: 0.92 mg/dL (ref 0.40–1.20)
GFR: 62.96 mL/min (ref >60.00)
GLUCOSE: 108 mg/dL — AB (ref 70–99)
Potassium: 4.8 mEq/L (ref 3.5–5.1)
Sodium: 142 mEq/L (ref 135–145)

## 2014-06-21 ENCOUNTER — Inpatient Hospital Stay: Payer: Medicare Other | Attending: Oncology | Admitting: Oncology

## 2014-06-21 ENCOUNTER — Encounter: Payer: Self-pay | Admitting: *Deleted

## 2014-06-21 ENCOUNTER — Inpatient Hospital Stay: Payer: Medicare Other

## 2014-06-21 VITALS — BP 138/92 | HR 62 | Temp 97.5°F | Wt 149.7 lb

## 2014-06-21 DIAGNOSIS — Z85048 Personal history of other malignant neoplasm of rectum, rectosigmoid junction, and anus: Secondary | ICD-10-CM | POA: Insufficient documentation

## 2014-06-21 DIAGNOSIS — Z87442 Personal history of urinary calculi: Secondary | ICD-10-CM | POA: Insufficient documentation

## 2014-06-21 DIAGNOSIS — R32 Unspecified urinary incontinence: Secondary | ICD-10-CM | POA: Insufficient documentation

## 2014-06-21 DIAGNOSIS — C189 Malignant neoplasm of colon, unspecified: Secondary | ICD-10-CM

## 2014-06-21 DIAGNOSIS — G629 Polyneuropathy, unspecified: Secondary | ICD-10-CM | POA: Insufficient documentation

## 2014-06-21 DIAGNOSIS — E780 Pure hypercholesterolemia: Secondary | ICD-10-CM | POA: Insufficient documentation

## 2014-06-21 DIAGNOSIS — Z923 Personal history of irradiation: Secondary | ICD-10-CM

## 2014-06-21 DIAGNOSIS — Z9221 Personal history of antineoplastic chemotherapy: Secondary | ICD-10-CM | POA: Diagnosis not present

## 2014-06-21 DIAGNOSIS — I10 Essential (primary) hypertension: Secondary | ICD-10-CM

## 2014-06-21 DIAGNOSIS — Z7982 Long term (current) use of aspirin: Secondary | ICD-10-CM

## 2014-06-21 DIAGNOSIS — Z79899 Other long term (current) drug therapy: Secondary | ICD-10-CM | POA: Diagnosis not present

## 2014-06-21 LAB — COMPREHENSIVE METABOLIC PANEL
ALK PHOS: 60 U/L (ref 38–126)
ALT: 25 U/L (ref 14–54)
ANION GAP: 6 (ref 5–15)
AST: 30 U/L (ref 15–41)
Albumin: 3.9 g/dL (ref 3.5–5.0)
BILIRUBIN TOTAL: 0.9 mg/dL (ref 0.3–1.2)
BUN: 23 mg/dL — AB (ref 6–20)
CHLORIDE: 103 mmol/L (ref 101–111)
CO2: 29 mmol/L (ref 22–32)
CREATININE: 0.82 mg/dL (ref 0.44–1.00)
Calcium: 8.6 mg/dL — ABNORMAL LOW (ref 8.9–10.3)
GFR calc Af Amer: >60 mL/min (ref >60)
GFR calc non Af Amer: >60 mL/min (ref >60)
GLUCOSE: 116 mg/dL — AB (ref 65–99)
POTASSIUM: 4.7 mmol/L (ref 3.5–5.1)
Sodium: 138 mmol/L (ref 135–145)
TOTAL PROTEIN: 6.7 g/dL (ref 6.5–8.1)

## 2014-06-21 LAB — CBC WITH DIFFERENTIAL/PLATELET
BASOS ABS: 0.1 10*3/uL (ref 0–0.1)
BASOS PCT: 1 %
EOS ABS: 0 10*3/uL (ref 0–0.7)
Eosinophils Relative: 1 %
HEMATOCRIT: 38.6 % (ref 35.0–47.0)
Hemoglobin: 12.7 g/dL (ref 12.0–16.0)
LYMPHS ABS: 1 10*3/uL (ref 1.0–3.6)
LYMPHS PCT: 17 %
MCH: 34.1 pg — ABNORMAL HIGH (ref 26.0–34.0)
MCHC: 33 g/dL (ref 32.0–36.0)
MCV: 103.2 fL — AB (ref 80.0–100.0)
MONOS PCT: 9 %
Monocytes Absolute: 0.5 10*3/uL (ref 0.2–0.9)
NEUTROS ABS: 4.5 10*3/uL (ref 1.4–6.5)
Neutrophils Relative %: 72 %
Platelets: 213 10*3/uL (ref 150–440)
RBC: 3.74 MIL/uL — ABNORMAL LOW (ref 3.80–5.20)
RDW: 13.6 % (ref 11.5–14.5)
WBC: 6.2 10*3/uL (ref 3.6–11.0)

## 2014-06-21 MED ORDER — PREGABALIN 75 MG PO CAPS: 75.0000 mg | ORAL_CAPSULE | Freq: Two times a day (BID) | ORAL | Status: DC

## 2014-06-22 LAB — CEA: CEA: 2.1 ng/mL (ref 0.0–4.7)

## 2014-06-26 ENCOUNTER — Encounter: Payer: Self-pay | Admitting: Oncology

## 2014-06-26 NOTE — Progress Notes (Signed)
[place] [facility] [phone]  [phone]     Christina Mathis OB: Oct 17, 1935  MR#: [id number]  BLT#:[id number]  Patient Care Team: Einar Pheasant, MD as PCP - General (Internal Medicine)  CHIEF COMPLAINT:  Chief Complaint  Patient presents with  . Follow-up    Oncology History   76. 79 year old female with stage III (T3, N1, M0) adenocarcinoma of the rectum for  preop chemoradiation 2. Starting 5-FU by continuous infusion and radiation therapy November 24, 2012 3. Patient is finishing up radiation and chemotherapy on December 15 of 2014 4. Status postsurgery and colostomyFebruary of 2015, pT0 pNO tumor stage 0 5. Post operative adjuvant with FOLFOX march 2015 6.patient finished total 6 cycles of chemotherapy with FOLFOX on August 31, 2013. 7.  January 2 016 [name] had hernia repair     Colon cancer   11/26/2011 Initial Diagnosis Colon cancer    No flowsheet data found.  INTERVAL HISTORY:  79 year old lady with history of carcinoma of colon.  Patient is here for ongoing evaluation and treatment consideration.  Patient recently had  PARAColostomy hernia repair.  No abdominal pain. No rectal bleeding.  Appetite has been fairly stable.  REVIEW OF SYSTEMS:   GENERAL:  Feels good.  Active.  No fevers, sweats or weight loss. PERFORMANCE STATUS (ECOG):  0 HEENT:  No visual changes, runny nose, sore throat, mouth sores or tenderness. Lungs: No shortness of breath or cough.  No hemoptysis. Cardiac:  No chest pain, palpitations, orthopnea, or PND. GI:  No nausea, vomiting, diarrhea, constipation, melena or hematochezia. GU:  No urgency, frequency, dysuria, or hematuria. Musculoskeletal:  No back pain.  No joint pain.  No muscle tenderness. Extremities:  No pain or swelling. Skin:  No rashes or skin changes. Neuro:  No headache.  Patient does have tingling and numbness in lower extremity,  Endocrine:  No diabetes, thyroid issues, hot flashes or night  sweats. Psych:  No mood changes, depression or anxiety. Pain:  No focal pain. Review of systems:  All other systems reviewed and found to be negative.  As per HPI. Otherwise, a complete review of systems is negatve.  PAST MEDICAL HISTORY: Past Medical History  Diagnosis Date  . Hypertension   . Hypercholesteremia   . Colon cancer   . History of shingles     twice  . Urine incontinence 03/06/2013    h/o  . Nephrolithiasis   . Neuropathy   . Left carotid bruit     PAST SURGICAL HISTORY: Past Surgical History  Procedure Laterality Date  . Cesarean section    . Appendectomy  1979    colorectal  . Ventral hernia repair Right [id number]  . Surgery for colorectal cancer    . Colostomy      FAMILY HISTORY Family History  Problem Relation Age of Onset  . Hypertension Mother   . Hypertension Father   . Breast cancer Paternal Aunt     GYNECOLOGIC HISTORY:  No LMP recorded. Patient is postmenopausal.     ADVANCED DIRECTIVES: Patient does have advanced health care directive  HEALTH MAINTENANCE: History  Substance Use Topics  . Smoking status: Never Smoker   . Smokeless tobacco: Never Used  . Alcohol Use: No     No Known Allergies  Current Outpatient Prescriptions  Medication Sig Dispense Refill  . aspirin EC 81 MG tablet Take 81 mg by mouth daily.    . calcium citrate-vitamin D (CITRACAL+D) 315-200 MG-UNIT per tablet Take 1 tablet by mouth 2 (two)  times daily.    Marland Kitchen glucosamine-chondroitin 500-400 MG tablet Take 1 tablet by mouth 2 (two) times daily.    Marland Kitchen losartan-hydrochlorothiazide (HYZAAR) 50-12.5 MG per tablet Take 1 tablet by mouth daily.    Marland Kitchen lovastatin (MEVACOR) 20 MG tablet Take 20 mg by mouth at bedtime.    . Multiple Vitamin (MULTI-VITAMINS) TABS Take by mouth.    . loratadine (CLARITIN) 10 MG tablet Take 10 mg by mouth daily as needed.     . pregabalin (LYRICA) 75 MG capsule Take 1 capsule (75 mg total) by mouth 2 (two) times daily. 60 capsule 3   No  current facility-administered medications for this visit.    OBJECTIVE:  Filed Vitals:   06/21/14 1139  BP: 138/92  Pulse: 62  Temp: 97.5 F (36.4 C)     Body mass index is 26.52 kg/(m^2).    ECOG FS:1 - Symptomatic but completely ambulatory  PHYSICAL EXAM: GENERAL:  Well developed, well nourished, sitting comfortably in the exam room in no acute distress. MENTAL STATUS:  Alert and oriented to person, place and time. HEAD: Normocephalic, atraumatic, face symmetric, no Cushingoid features. EYES:  .  Pupils equal round and reactive to light and accomodation.  No conjunctivitis or scleral icterus. ENT:  Oropharynx clear without lesion.  Tongue normal. Mucous membranes moist.  RESPIRATORY:  Clear to auscultation without rales, wheezes or rhonchi. CARDIOVASCULAR:  Regular rate and rhythm without murmur, rub or gallop. BREAST:  Right breast without masses, skin changes or nipple discharge.  Left breast without masses, skin changes or nipple discharge. ABDOMEN:  Soft, non-tender, with active bowel sounds, and no hepatosplenomegaly.  No masses. Colostomy is functioning very well.  Patient recently had hernia repair and wound is healing well BACK:  No CVA tenderness.  No tenderness on percussion of the back or rib cage. SKIN:  No rashes, ulcers or lesions. EXTREMITIES: No edema, no skin discoloration or tenderness.  No palpable cords. LYMPH NODES: No palpable cervical, supraclavicular, axillary or inguinal adenopathy  NEUROLOGICAL: For her neuropathy.  Sensory neuropathy grade 2 without any motor weakness PSYCH:  Appropriate.   LAB RESULTS:  Appointment on 06/21/2014  Component Date Value Ref Range Status  . WBC 06/21/2014 6.2  3.6 - 11.0 K/uL Final  . RBC 06/21/2014 3.74* 3.80 - 5.20 MIL/uL Final  . Hemoglobin 06/21/2014 12.7  12.0 - 16.0 g/dL Final  . HCT 06/21/2014 38.6  35.0 - 47.0 % Final  . MCV 06/21/2014 103.2* 80.0 - 100.0 fL Final  . MCH 06/21/2014 34.1* 26.0 - 34.0 pg Final   . MCHC 06/21/2014 33.0  32.0 - 36.0 g/dL Final  . RDW 06/21/2014 13.6  11.5 - 14.5 % Final  . Platelets 06/21/2014 213  150 - 440 K/uL Final  . Neutrophils Relative % 06/21/2014 72   Final  . Neutro Abs 06/21/2014 4.5  1.4 - 6.5 K/uL Final  . Lymphocytes Relative 06/21/2014 17   Final  . Lymphs Abs 06/21/2014 1.0  1.0 - 3.6 K/uL Final  . Monocytes Relative 06/21/2014 9   Final  . Monocytes Absolute 06/21/2014 0.5  0.2 - 0.9 K/uL Final  . Eosinophils Relative 06/21/2014 1   Final  . Eosinophils Absolute 06/21/2014 0.0  0 - 0.7 K/uL Final  . Basophils Relative 06/21/2014 1   Final  . Basophils Absolute 06/21/2014 0.1  0 - 0.1 K/uL Final  . Sodium 06/21/2014 138  135 - 145 mmol/L Final  . Potassium 06/21/2014 4.7  3.5 - 5.1 mmol/L  Final  . Chloride 06/21/2014 103  101 - 111 mmol/L Final  . CO2 06/21/2014 29  22 - 32 mmol/L Final  . Glucose, Bld 06/21/2014 116* 65 - 99 mg/dL Final  . BUN 06/21/2014 23* 6 - 20 mg/dL Final  . Creatinine, Ser 06/21/2014 0.82  0.44 - 1.00 mg/dL Final  . Calcium 06/21/2014 8.6* 8.9 - 10.3 mg/dL Final  . Total Protein 06/21/2014 6.7  6.5 - 8.1 g/dL Final  . Albumin 06/21/2014 3.9  3.5 - 5.0 g/dL Final  . AST 06/21/2014 30  15 - 41 U/L Final  . ALT 06/21/2014 25  14 - 54 U/L Final  . Alkaline Phosphatase 06/21/2014 60  38 - 126 U/L Final  . Total Bilirubin 06/21/2014 0.9  0.3 - 1.2 mg/dL Final  . GFR calc non Af Amer 06/21/2014 >60  >60 mL/min Final  . GFR calc Af Amer 06/21/2014 >60  >60 mL/min Final   Comment: (NOTE) The eGFR has been calculated using the CKD EPI equation. This calculation has not been validated in all clinical situations. eGFR's persistently <60 mL/min signify possible Chronic Kidney Disease.   . Anion gap 06/21/2014 6  5 - 15 Final  . CEA 06/21/2014 2.1  0.0 - 4.7 ng/mL Final   Comment: (NOTE)       Roche ECLIA methodology       Nonsmokers  <3.9                                     Smokers     <5.6 Performed At: [facility] [place], [place] [id number] Lindon Romp MD GG:[id number]     No results found for: LABCA2 No results found for: CA199 Lab Results  Component Value Date   CEA 2.1 06/21/2014     STUDIES: No results found.  ASSESSMENT: Carcinoma of rectum stage III disease status post radiation chemotherapy at present time there is no evidence of recurrent disease Peripheral neuropathy grade to persist  MEDICAL DECISION MAKING:  All lab data has been reviewed CEA stable on clinical ground there is no evidence of recurrent disease Neuropathy secondary to chemotherapy  Patient expressed understanding and was in agreement with this plan. She also understands that She can call clinic at any time with any questions, concerns, or complaints.    No matching staging information was found for the patient.  Forest Gleason, MD   06/26/2014 8:35 AM

## 2014-07-06 ENCOUNTER — Telehealth: Payer: Self-pay | Admitting: *Deleted

## 2014-07-06 ENCOUNTER — Inpatient Hospital Stay: Payer: Medicare Other | Attending: Oncology

## 2014-07-06 DIAGNOSIS — Z452 Encounter for adjustment and management of vascular access device: Secondary | ICD-10-CM | POA: Insufficient documentation

## 2014-07-06 DIAGNOSIS — C189 Malignant neoplasm of colon, unspecified: Secondary | ICD-10-CM

## 2014-07-06 DIAGNOSIS — Z85048 Personal history of other malignant neoplasm of rectum, rectosigmoid junction, and anus: Secondary | ICD-10-CM | POA: Insufficient documentation

## 2014-07-06 DIAGNOSIS — C801 Malignant (primary) neoplasm, unspecified: Secondary | ICD-10-CM

## 2014-07-06 MED ORDER — SODIUM CHLORIDE 0.9 % IJ SOLN
10.0000 mL | INTRAMUSCULAR | Status: DC | PRN
  Administered 2014-07-06: 10 mL
  Filled 2014-07-06: qty 10

## 2014-07-06 MED ORDER — LIDOCAINE-PRILOCAINE 2.5-2.5 % EX CREA: 1.0000 "application " | TOPICAL_CREAM | CUTANEOUS | Status: DC | PRN

## 2014-07-06 MED ORDER — HEPARIN SOD (PORK) LOCK FLUSH 100 UNIT/ML IV SOLN
500.0000 [IU] | Freq: Once | INTRAVENOUS | Status: AC
  Administered 2014-07-06: 500 [IU] via INTRAVENOUS

## 2014-07-06 NOTE — Telephone Encounter (Signed)
Rx for EMLA cream called into pharmacy.

## 2014-08-06 ENCOUNTER — Ambulatory Visit: Payer: Medicare Other | Admitting: Radiation Oncology

## 2014-08-10 ENCOUNTER — Inpatient Hospital Stay: Payer: Medicare Other | Attending: Oncology

## 2014-08-10 DIAGNOSIS — C20 Malignant neoplasm of rectum: Secondary | ICD-10-CM | POA: Diagnosis not present

## 2014-08-10 DIAGNOSIS — Z452 Encounter for adjustment and management of vascular access device: Secondary | ICD-10-CM | POA: Insufficient documentation

## 2014-08-10 DIAGNOSIS — C801 Malignant (primary) neoplasm, unspecified: Secondary | ICD-10-CM

## 2014-08-10 MED ORDER — HEPARIN SOD (PORK) LOCK FLUSH 100 UNIT/ML IV SOLN
500.0000 [IU] | Freq: Once | INTRAVENOUS | Status: AC
  Administered 2014-08-10: 500 [IU] via INTRAVENOUS

## 2014-08-10 MED ORDER — HEPARIN SOD (PORK) LOCK FLUSH 100 UNIT/ML IV SOLN
INTRAVENOUS | Status: AC
  Filled 2014-08-10: qty 5

## 2014-08-10 MED ORDER — SODIUM CHLORIDE 0.9 % IJ SOLN
10.0000 mL | INTRAMUSCULAR | Status: DC | PRN
  Administered 2014-08-10: 10 mL via INTRAVENOUS
  Filled 2014-08-10: qty 10

## 2014-08-12 ENCOUNTER — Ambulatory Visit: Payer: Medicare Other | Admitting: Radiation Oncology

## 2014-09-21 ENCOUNTER — Inpatient Hospital Stay: Payer: Medicare Other | Attending: Oncology

## 2014-09-21 DIAGNOSIS — Z452 Encounter for adjustment and management of vascular access device: Secondary | ICD-10-CM | POA: Diagnosis not present

## 2014-09-21 DIAGNOSIS — C801 Malignant (primary) neoplasm, unspecified: Secondary | ICD-10-CM

## 2014-09-21 DIAGNOSIS — Z85038 Personal history of other malignant neoplasm of large intestine: Secondary | ICD-10-CM | POA: Insufficient documentation

## 2014-09-21 MED ORDER — SODIUM CHLORIDE 0.9 % IJ SOLN
10.0000 mL | INTRAMUSCULAR | Status: DC | PRN
  Administered 2014-09-21: 10 mL via INTRAVENOUS
  Filled 2014-09-21: qty 10

## 2014-09-21 MED ORDER — HEPARIN SOD (PORK) LOCK FLUSH 100 UNIT/ML IV SOLN
500.0000 [IU] | Freq: Once | INTRAVENOUS | Status: AC
  Administered 2014-09-21: 500 [IU] via INTRAVENOUS

## 2014-09-21 MED ORDER — HEPARIN SOD (PORK) LOCK FLUSH 100 UNIT/ML IV SOLN
INTRAVENOUS | Status: AC
  Filled 2014-09-21: qty 5

## 2014-10-12 ENCOUNTER — Encounter: Payer: Self-pay | Admitting: Internal Medicine

## 2014-10-12 ENCOUNTER — Other Ambulatory Visit: Payer: Self-pay | Admitting: Internal Medicine

## 2014-10-12 ENCOUNTER — Ambulatory Visit (INDEPENDENT_AMBULATORY_CARE_PROVIDER_SITE_OTHER): Payer: Medicare Other | Admitting: Internal Medicine

## 2014-10-12 VITALS — BP 110/58 | HR 60 | Temp 97.7°F | Ht 63.0 in | Wt 166.0 lb

## 2014-10-12 DIAGNOSIS — Z0000 Encounter for general adult medical examination without abnormal findings: Secondary | ICD-10-CM

## 2014-10-12 DIAGNOSIS — I10 Essential (primary) hypertension: Secondary | ICD-10-CM

## 2014-10-12 DIAGNOSIS — R0989 Other specified symptoms and signs involving the circulatory and respiratory systems: Secondary | ICD-10-CM

## 2014-10-12 DIAGNOSIS — Z1239 Encounter for other screening for malignant neoplasm of breast: Secondary | ICD-10-CM

## 2014-10-12 DIAGNOSIS — E7800 Pure hypercholesterolemia, unspecified: Secondary | ICD-10-CM

## 2014-10-12 DIAGNOSIS — Z23 Encounter for immunization: Secondary | ICD-10-CM

## 2014-10-12 DIAGNOSIS — R739 Hyperglycemia, unspecified: Secondary | ICD-10-CM

## 2014-10-12 DIAGNOSIS — G629 Polyneuropathy, unspecified: Secondary | ICD-10-CM

## 2014-10-12 DIAGNOSIS — C189 Malignant neoplasm of colon, unspecified: Secondary | ICD-10-CM

## 2014-10-12 LAB — LIPID PANEL
CHOL/HDL RATIO: 2
Cholesterol: 101 mg/dL (ref 0–200)
HDL: 41.5 mg/dL (ref >39.00)
LDL CALC: 46 mg/dL (ref 0–99)
NONHDL: 59.06
Triglycerides: 65 mg/dL (ref 0.0–149.0)
VLDL: 13 mg/dL (ref 0.0–40.0)

## 2014-10-12 LAB — HEPATIC FUNCTION PANEL
ALT: 20 U/L (ref 0–35)
AST: 25 U/L (ref 0–37)
Albumin: 3.5 g/dL (ref 3.5–5.2)
Alkaline Phosphatase: 63 U/L (ref 39–117)
BILIRUBIN DIRECT: 0.1 mg/dL (ref 0.0–0.3)
BILIRUBIN TOTAL: 0.6 mg/dL (ref 0.2–1.2)
Total Protein: 6.3 g/dL (ref 6.0–8.3)

## 2014-10-12 LAB — BASIC METABOLIC PANEL
BUN: 20 mg/dL (ref 6–23)
CHLORIDE: 106 meq/L (ref 96–112)
CO2: 30 mEq/L (ref 19–32)
Calcium: 9.2 mg/dL (ref 8.4–10.5)
Creatinine, Ser: 0.88 mg/dL (ref 0.40–1.20)
GFR: 66.21 mL/min (ref >60.00)
GLUCOSE: 120 mg/dL — AB (ref 70–99)
POTASSIUM: 4.9 meq/L (ref 3.5–5.1)
Sodium: 142 mEq/L (ref 135–145)

## 2014-10-12 MED ORDER — LOSARTAN POTASSIUM-HCTZ 50-12.5 MG PO TABS: 1.0000 | ORAL_TABLET | Freq: Every day | ORAL | Status: DC

## 2014-10-12 NOTE — Progress Notes (Signed)
Pre-visit discussion using our clinic review tool. No additional management support is needed unless otherwise documented below in the visit note.  

## 2014-10-12 NOTE — Progress Notes (Signed)
[name], female   DOB: 03/08/1935, 79 y.o.   MRN: [id number]   Subjective:    [name], female    DOB: March 18, 1935, 79 y.o.   MRN: [id number]  HPI  Patient here to follow up regarding her hypertension and high cholesterol.  She tries to stay active.  No cardiac symptoms with increased activity or exertion.  No sob.  No acid reflux reported.  No abdominal pain or cramping.  Ostomy stable.  Sees Dr Oliva Bustard.  CEA stable 05/2014.  Has f/u with Dr Oliva Bustard in 10-11/2014.     Past Medical History  Diagnosis Date  . Hypertension   . Hypercholesteremia   . Colon cancer   . History of shingles     twice  . Urine incontinence 03/06/2013    h/o  . Nephrolithiasis   . Neuropathy   . Left carotid bruit    Past Surgical History  Procedure Laterality Date  . Cesarean section    . Appendectomy  1979    colorectal  . Ventral hernia repair Right [id number]  . Surgery for colorectal cancer    . Colostomy     Family History  Problem Relation Age of Onset  . Hypertension Mother   . Hypertension Father   . Breast cancer Paternal Aunt    Social History   Social History  . Marital Status: Widowed    Spouse Name: N/A  . Number of Children: N/A  . Years of Education: N/A   Social History Main Topics  . Smoking status: Never Smoker   . Smokeless tobacco: Never Used  . Alcohol Use: No  . Drug Use: No  . Sexual Activity: Not Asked   Other Topics Concern  . None   Social History Narrative    Outpatient Encounter Prescriptions as of 10/12/2014  Medication Sig  . aspirin EC 81 MG tablet Take 81 mg by mouth daily.  . calcium citrate-vitamin D (CITRACAL+D) 315-200 MG-UNIT per tablet Take 1 tablet by mouth 2 (two) times daily.  Marland Kitchen glucosamine-chondroitin 500-400 MG tablet Take 1 tablet by mouth 2 (two) times daily.  Marland Kitchen lidocaine-prilocaine (EMLA) cream Apply 1 application topically as needed.  . loratadine (CLARITIN) 10 MG tablet Take 10 mg by mouth daily as  needed.   Marland Kitchen losartan-hydrochlorothiazide (HYZAAR) 50-12.5 MG per tablet Take 1 tablet by mouth daily.  Marland Kitchen lovastatin (MEVACOR) 20 MG tablet Take 20 mg by mouth at bedtime.  . Multiple Vitamin (MULTI-VITAMINS) TABS Take by mouth.  . pregabalin (LYRICA) 75 MG capsule Take 1 capsule (75 mg total) by mouth 2 (two) times daily.  . [DISCONTINUED] losartan-hydrochlorothiazide (HYZAAR) 50-12.5 MG per tablet Take 1 tablet by mouth daily.   No facility-administered encounter medications on file as of 10/12/2014.    Review of Systems  Constitutional: Negative for appetite change and unexpected weight change.  HENT: Negative for congestion and sinus pressure.   Eyes: Negative for pain and visual disturbance.  Respiratory: Negative for cough, chest tightness and shortness of breath.   Cardiovascular: Negative for chest pain, palpitations and leg swelling.  Gastrointestinal: Negative for nausea, vomiting, abdominal pain and diarrhea.  Genitourinary: Negative for dysuria and difficulty urinating.  Musculoskeletal: Negative for back pain and joint swelling.  Skin: Negative for color change and rash.  Neurological: Negative for dizziness, light-headedness and headaches.  Psychiatric/Behavioral: Negative for dysphoric mood and agitation.       Objective:    Physical Exam  Constitutional: She appears well-developed and well-nourished. No  distress.  HENT:  Nose: Nose normal.  Mouth/Throat: Oropharynx is clear and moist.  Eyes: Conjunctivae are normal. Right eye exhibits no discharge. Left eye exhibits no discharge.  Neck: Neck supple. No thyromegaly present.  Cardiovascular: Normal rate and regular rhythm.   Pulmonary/Chest: Breath sounds normal. No respiratory distress. She has no wheezes.  Abdominal: Soft. Bowel sounds are normal. There is no tenderness.  Musculoskeletal: She exhibits no edema or tenderness.  Lymphadenopathy:    She has no cervical adenopathy.  Skin: No rash noted. No erythema.    Psychiatric: She has a normal mood and affect. Her behavior is normal.    BP 110/58 mmHg  Pulse 60  Temp(Src) 97.7 F (36.5 C) (Oral)  Ht 5\' 3"  (1.6 m)  Wt 166 lb (75.297 kg)  BMI 29.41 kg/m2  SpO2 96% Wt Readings from Last 3 Encounters:  10/12/14 166 lb (75.297 kg)  06/21/14 149 lb 11.1 oz (67.9 kg)  06/09/14 151 lb 4 oz (68.607 kg)     Lab Results  Component Value Date   WBC 6.2 06/21/2014   HGB 12.7 06/21/2014   HCT 38.6 06/21/2014   PLT 213 06/21/2014   GLUCOSE 120* 10/12/2014   CHOL 101 10/12/2014   TRIG 65.0 10/12/2014   HDL 41.50 10/12/2014   LDLCALC 46 10/12/2014   ALT 20 10/12/2014   AST 25 10/12/2014   NA 142 10/12/2014   K 4.9 10/12/2014   CL 106 10/12/2014   CREATININE 0.88 10/12/2014   BUN 20 10/12/2014   CO2 30 10/12/2014   TSH 3.09 03/04/2014   INR 1.1 10/21/2012       Assessment & Plan:   Problem List Items Addressed This Visit    Colon cancer    S/p XRT and chemo.   Continues f/u at the cancer center.  Sees Dr Tamala Julian. Has ostomy.  Follow.  Last CEA stable.        Essential hypertension    Blood pressure under good control.  Continue same medication regimen.  Follow pressures.  Follow metabolic panel.        Relevant Medications   losartan-hydrochlorothiazide (HYZAAR) 50-12.5 MG per tablet   Other Relevant Orders   Basic metabolic panel (Completed)   Health care maintenance    Physical 06/09/14.  PAP 06/09/14.  Mammogram 10/2013.  Schedule f/u mammogram.       Hypercholesterolemia    Low cholesterol diet and exercise.  On lovastatin.  Check lipid panel and liver function tests.        Relevant Medications   losartan-hydrochlorothiazide (HYZAAR) 50-12.5 MG per tablet   Other Relevant Orders   Lipid panel (Completed)   Hepatic function panel (Completed)   Left carotid bruit - Primary    Has a left carotid bruit.  Check carotid ultrasound.        Relevant Orders   Ambulatory referral to Vascular Surgery   Neuropathy    On  gabapentin.  S/p chemo.  Stable.         Other Visit Diagnoses    Screening breast examination        Relevant Orders    MM DIGITAL SCREENING BILATERAL    Encounter for immunization            Einar Pheasant, MD

## 2014-10-12 NOTE — Progress Notes (Signed)
Order placed for f/u labs.  

## 2014-10-13 ENCOUNTER — Encounter: Payer: Self-pay | Admitting: *Deleted

## 2014-10-17 ENCOUNTER — Encounter: Payer: Self-pay | Admitting: Internal Medicine

## 2014-10-17 NOTE — Assessment & Plan Note (Signed)
On gabapentin.  S/p chemo.  Stable.

## 2014-10-17 NOTE — Assessment & Plan Note (Signed)
Has a left carotid bruit.  Check carotid ultrasound.

## 2014-10-17 NOTE — Assessment & Plan Note (Signed)
Physical 06/09/14.  PAP 06/09/14.  Mammogram 10/2013.  Schedule f/u mammogram.

## 2014-10-17 NOTE — Assessment & Plan Note (Signed)
Low cholesterol diet and exercise.  On lovastatin.  Check lipid panel and liver function tests.   

## 2014-10-17 NOTE — Assessment & Plan Note (Signed)
Blood pressure under good control.  Continue same medication regimen.  Follow pressures.  Follow metabolic panel.   

## 2014-10-17 NOTE — Assessment & Plan Note (Signed)
S/p XRT and chemo.   Continues f/u at the cancer center.  Sees Dr Tamala Julian. Has ostomy.  Follow.  Last CEA stable.

## 2014-10-26 ENCOUNTER — Telehealth: Payer: Self-pay | Admitting: *Deleted

## 2014-10-26 DIAGNOSIS — G629 Polyneuropathy, unspecified: Secondary | ICD-10-CM

## 2014-10-26 MED ORDER — PREGABALIN 75 MG PO CAPS: 75.0000 mg | ORAL_CAPSULE | Freq: Two times a day (BID) | ORAL | Status: DC

## 2014-10-26 NOTE — Telephone Encounter (Signed)
-----   Message from Loa Socks sent at 10/26/2014 12:02 PM EDT ----- Needs r/f on [place]

## 2014-10-26 NOTE — Telephone Encounter (Signed)
Refill faxed to [facility] on [address].

## 2014-11-02 ENCOUNTER — Inpatient Hospital Stay: Payer: Medicare Other | Attending: Oncology

## 2014-11-02 DIAGNOSIS — C801 Malignant (primary) neoplasm, unspecified: Secondary | ICD-10-CM

## 2014-11-02 DIAGNOSIS — Z85038 Personal history of other malignant neoplasm of large intestine: Secondary | ICD-10-CM | POA: Diagnosis present

## 2014-11-02 DIAGNOSIS — Z452 Encounter for adjustment and management of vascular access device: Secondary | ICD-10-CM | POA: Diagnosis not present

## 2014-11-02 MED ORDER — SODIUM CHLORIDE 0.9 % IJ SOLN
10.0000 mL | INTRAMUSCULAR | Status: DC | PRN
  Administered 2014-11-02: 10 mL via INTRAVENOUS
  Filled 2014-11-02: qty 10

## 2014-11-02 MED ORDER — HEPARIN SOD (PORK) LOCK FLUSH 100 UNIT/ML IV SOLN
500.0000 [IU] | Freq: Once | INTRAVENOUS | Status: AC
Start: 2014-11-02 — End: 2014-11-02
  Administered 2014-11-02: 500 [IU] via INTRAVENOUS

## 2014-11-02 MED ORDER — HEPARIN SOD (PORK) LOCK FLUSH 100 UNIT/ML IV SOLN
INTRAVENOUS | Status: AC
  Filled 2014-11-02: qty 5

## 2014-11-11 ENCOUNTER — Encounter (INDEPENDENT_AMBULATORY_CARE_PROVIDER_SITE_OTHER): Payer: Self-pay

## 2014-11-11 ENCOUNTER — Other Ambulatory Visit (INDEPENDENT_AMBULATORY_CARE_PROVIDER_SITE_OTHER): Payer: Medicare Other

## 2014-11-11 ENCOUNTER — Encounter: Payer: Self-pay | Admitting: *Deleted

## 2014-11-11 DIAGNOSIS — R739 Hyperglycemia, unspecified: Secondary | ICD-10-CM

## 2014-11-11 LAB — HEMOGLOBIN A1C: HEMOGLOBIN A1C: 5.6 % (ref 4.6–6.5)

## 2014-11-12 LAB — GLUCOSE, FASTING: Glucose, Fasting: 110 mg/dL — ABNORMAL HIGH (ref 65–99)

## 2014-12-06 ENCOUNTER — Ambulatory Visit
Admission: RE | Admit: 2014-12-06 | Discharge: 2014-12-06 | Disposition: A | Discharge status: Home / Self Care | Payer: Medicare Other | Source: Ambulatory Visit | Attending: Internal Medicine | Admitting: Internal Medicine

## 2014-12-06 DIAGNOSIS — Z1231 Encounter for screening mammogram for malignant neoplasm of breast: Secondary | ICD-10-CM | POA: Insufficient documentation

## 2014-12-06 DIAGNOSIS — Z1239 Encounter for other screening for malignant neoplasm of breast: Secondary | ICD-10-CM

## 2014-12-14 ENCOUNTER — Inpatient Hospital Stay: Payer: Medicare Other | Attending: Oncology

## 2014-12-14 DIAGNOSIS — Z85048 Personal history of other malignant neoplasm of rectum, rectosigmoid junction, and anus: Secondary | ICD-10-CM | POA: Insufficient documentation

## 2014-12-14 DIAGNOSIS — Z452 Encounter for adjustment and management of vascular access device: Secondary | ICD-10-CM | POA: Insufficient documentation

## 2014-12-14 DIAGNOSIS — C801 Malignant (primary) neoplasm, unspecified: Secondary | ICD-10-CM

## 2014-12-14 MED ORDER — SODIUM CHLORIDE 0.9 % IJ SOLN
10.0000 mL | INTRAMUSCULAR | Status: DC | PRN
  Administered 2014-12-14: 10 mL via INTRAVENOUS
  Filled 2014-12-14: qty 10

## 2014-12-14 MED ORDER — HEPARIN SOD (PORK) LOCK FLUSH 100 UNIT/ML IV SOLN
500.0000 [IU] | Freq: Once | INTRAVENOUS | Status: AC
Start: 2014-12-14 — End: 2014-12-14
  Administered 2014-12-14: 500 [IU] via INTRAVENOUS
  Filled 2014-12-14: qty 5

## 2014-12-22 ENCOUNTER — Other Ambulatory Visit: Payer: Medicare Other

## 2014-12-22 ENCOUNTER — Ambulatory Visit: Payer: Medicare Other | Admitting: Oncology

## 2015-01-06 ENCOUNTER — Ambulatory Visit: Payer: Medicare Other | Admitting: Oncology

## 2015-01-06 ENCOUNTER — Other Ambulatory Visit: Payer: Medicare Other

## 2015-01-11 ENCOUNTER — Inpatient Hospital Stay: Payer: Medicare Other | Attending: Oncology | Admitting: Oncology

## 2015-01-11 ENCOUNTER — Encounter: Payer: Self-pay | Admitting: Oncology

## 2015-01-11 ENCOUNTER — Inpatient Hospital Stay: Payer: Medicare Other

## 2015-01-11 VITALS — BP 129/72 | HR 60 | Temp 95.7°F | Wt 164.5 lb

## 2015-01-11 DIAGNOSIS — Z85048 Personal history of other malignant neoplasm of rectum, rectosigmoid junction, and anus: Secondary | ICD-10-CM

## 2015-01-11 DIAGNOSIS — C189 Malignant neoplasm of colon, unspecified: Secondary | ICD-10-CM

## 2015-01-11 DIAGNOSIS — M2550 Pain in unspecified joint: Secondary | ICD-10-CM | POA: Diagnosis not present

## 2015-01-11 DIAGNOSIS — I10 Essential (primary) hypertension: Secondary | ICD-10-CM

## 2015-01-11 DIAGNOSIS — Z9049 Acquired absence of other specified parts of digestive tract: Secondary | ICD-10-CM

## 2015-01-11 DIAGNOSIS — Z9181 History of falling: Secondary | ICD-10-CM | POA: Diagnosis not present

## 2015-01-11 DIAGNOSIS — R32 Unspecified urinary incontinence: Secondary | ICD-10-CM

## 2015-01-11 DIAGNOSIS — Z803 Family history of malignant neoplasm of breast: Secondary | ICD-10-CM

## 2015-01-11 DIAGNOSIS — Z9221 Personal history of antineoplastic chemotherapy: Secondary | ICD-10-CM

## 2015-01-11 DIAGNOSIS — Z87442 Personal history of urinary calculi: Secondary | ICD-10-CM | POA: Diagnosis not present

## 2015-01-11 DIAGNOSIS — Z7982 Long term (current) use of aspirin: Secondary | ICD-10-CM | POA: Diagnosis not present

## 2015-01-11 DIAGNOSIS — G629 Polyneuropathy, unspecified: Secondary | ICD-10-CM | POA: Diagnosis not present

## 2015-01-11 DIAGNOSIS — Z79899 Other long term (current) drug therapy: Secondary | ICD-10-CM | POA: Diagnosis not present

## 2015-01-11 DIAGNOSIS — Z923 Personal history of irradiation: Secondary | ICD-10-CM | POA: Diagnosis not present

## 2015-01-11 DIAGNOSIS — R0989 Other specified symptoms and signs involving the circulatory and respiratory systems: Secondary | ICD-10-CM | POA: Diagnosis not present

## 2015-01-11 DIAGNOSIS — Z452 Encounter for adjustment and management of vascular access device: Secondary | ICD-10-CM | POA: Diagnosis not present

## 2015-01-11 DIAGNOSIS — E7800 Pure hypercholesterolemia, unspecified: Secondary | ICD-10-CM

## 2015-01-11 DIAGNOSIS — C20 Malignant neoplasm of rectum: Secondary | ICD-10-CM

## 2015-01-11 DIAGNOSIS — M545 Low back pain: Secondary | ICD-10-CM | POA: Diagnosis not present

## 2015-01-11 LAB — CBC WITH DIFFERENTIAL/PLATELET
Basophils Absolute: 0.1 10*3/uL (ref 0–0.1)
Basophils Relative: 1 %
Eosinophils Absolute: 0 10*3/uL (ref 0–0.7)
Eosinophils Relative: 1 %
HEMATOCRIT: 39.4 % (ref 35.0–47.0)
HEMOGLOBIN: 13.7 g/dL (ref 12.0–16.0)
LYMPHS ABS: 1.1 10*3/uL (ref 1.0–3.6)
LYMPHS PCT: 16 %
MCH: 34.8 pg — AB (ref 26.0–34.0)
MCHC: 34.9 g/dL (ref 32.0–36.0)
MCV: 99.8 fL (ref 80.0–100.0)
Monocytes Absolute: 0.6 10*3/uL (ref 0.2–0.9)
Monocytes Relative: 9 %
NEUTROS PCT: 73 %
Neutro Abs: 5.1 10*3/uL (ref 1.4–6.5)
Platelets: 265 10*3/uL (ref 150–440)
RBC: 3.95 MIL/uL (ref 3.80–5.20)
RDW: 13.4 % (ref 11.5–14.5)
WBC: 7 10*3/uL (ref 3.6–11.0)

## 2015-01-11 LAB — COMPREHENSIVE METABOLIC PANEL
ALK PHOS: 65 U/L (ref 38–126)
ALT: 31 U/L (ref 14–54)
AST: 30 U/L (ref 15–41)
Albumin: 3.8 g/dL (ref 3.5–5.0)
Anion gap: 6 (ref 5–15)
BUN: 22 mg/dL — ABNORMAL HIGH (ref 6–20)
CALCIUM: 9 mg/dL (ref 8.9–10.3)
CO2: 28 mmol/L (ref 22–32)
CREATININE: 0.89 mg/dL (ref 0.44–1.00)
Chloride: 105 mmol/L (ref 101–111)
Glucose, Bld: 117 mg/dL — ABNORMAL HIGH (ref 65–99)
Potassium: 4.4 mmol/L (ref 3.5–5.1)
Sodium: 139 mmol/L (ref 135–145)
Total Bilirubin: 0.8 mg/dL (ref 0.3–1.2)
Total Protein: 7 g/dL (ref 6.5–8.1)

## 2015-01-11 MED ORDER — INFLUENZA VAC SPLIT QUAD 0.5 ML IM SUSY: 0.5000 mL | PREFILLED_SYRINGE | Freq: Once | INTRAMUSCULAR | Status: DC

## 2015-01-11 NOTE — Progress Notes (Signed)
Patient states she is having edema in left leg and pain in right leg.

## 2015-01-11 NOTE — Progress Notes (Signed)
Christina Mathis @ [facility] [phone]  Fax:([phone]) [place] OB: 1935-03-30  MR#: [id number]  TZG#:[id number]  Patient Care Team: Einar Pheasant, MD as PCP - General (Internal Medicine)  CHIEF COMPLAINT:  Chief Complaint  Patient presents with  . Rectal Cancer    Oncology History   47. 79 year old female with stage III (T3, N1, M0) adenocarcinoma of the rectum for  preop chemoradiation 2. Starting 5-FU by continuous infusion and radiation therapy November 24, 2012 3. Patient is finishing up radiation and chemotherapy on December 15 of 2014 4. Status postsurgery and colostomyFebruary of 2015, pT0 pNO tumor stage 0 5. Post operative adjuvant with FOLFOX march 2015 6.patient finished total 6 cycles of chemotherapy with FOLFOX on August 31, 2013. 7.  January 2 016 [name] had hernia repair     Colon cancer ([place])   11/26/2011 Initial Diagnosis Colon cancer    No flowsheet data found.  INTERVAL HISTORY:  79 year old lady with history of carcinoma of colon.  Patient is here for ongoing evaluation and treatment consideration.  Patient recently had  PARAColostomy hernia repair.  No abdominal pain. No rectal bleeding.  Appetite has been fairly stable. He should not came today further follow-up.  Continues to have problem with neuropathy of lower extremity taking E Karp.  Patient wants to wean off E Garth. Has not seen tumor changes with neuropathy. Recently fell down and has some pain in the low back area Due for colonoscopy next year. No rectal bleeding.  Patient also has a pain in the left shoulder  REVIEW OF SYSTEMS:   GENERAL:  Feels good.  Active.  No fevers, sweats or weight loss. PERFORMANCE STATUS (ECOG):  0 HEENT:  No visual changes, runny nose, sore throat, mouth sores or tenderness. Lungs: No shortness of breath or cough.  No hemoptysis. Cardiac:  No chest pain, palpitations, orthopnea, or PND. GI:  No nausea, vomiting, diarrhea, constipation,  melena or hematochezia. GU:  No urgency, frequency, dysuria, or hematuria. Musculoskeletal: Low back pain and pain in the left shoulderor skin changes. Neuro:  No headache.  Patient does have tingling and numbness in lower extremity,  Endocrine:  No diabetes, thyroid issues, hot flashes or night sweats. Psych:  No mood changes, depression or anxiety. Pain:  No focal pain. Review of systems:  All other systems reviewed and found to be negative.  As per HPI. Otherwise, a complete review of systems is negatve.  PAST MEDICAL HISTORY: Past Medical History  Diagnosis Date  . Hypertension   . Hypercholesteremia   . Colon cancer ([place])   . History of shingles     twice  . Urine incontinence 03/06/2013    h/o  . Nephrolithiasis   . Neuropathy ([place])   . Left carotid bruit     PAST SURGICAL HISTORY: Past Surgical History  Procedure Laterality Date  . Cesarean section    . Appendectomy  1979    colorectal  . Ventral hernia repair Right [id number]  . Surgery for colorectal cancer    . Colostomy      FAMILY HISTORY Family History  Problem Relation Age of Onset  . Hypertension Mother   . Hypertension Father   . Breast cancer Paternal Aunt     GYNECOLOGIC HISTORY:  No LMP recorded. Patient is postmenopausal.     ADVANCED DIRECTIVES: Patient does have advanced health care directive  HEALTH MAINTENANCE: Social History  Substance Use Topics  . Smoking status: Never Smoker   . Smokeless  tobacco: Never Used  . Alcohol Use: No     No Known Allergies  Current Outpatient Prescriptions  Medication Sig Dispense Refill  . aspirin EC 81 MG tablet Take 81 mg by mouth daily.    Marland Kitchen b complex vitamins tablet Take 1 tablet by mouth daily.    . calcium citrate-vitamin D (CITRACAL+D) 315-200 MG-UNIT per tablet Take 1 tablet by mouth 2 (two) times daily.    Marland Kitchen glucosamine-chondroitin 500-400 MG tablet Take 1 tablet by mouth 2 (two) times daily.    Marland Kitchen loratadine (CLARITIN) 10 MG tablet Take  10 mg by mouth daily as needed.     Marland Kitchen losartan-hydrochlorothiazide (HYZAAR) 50-12.5 MG per tablet Take 1 tablet by mouth daily. 90 tablet 3  . lovastatin (MEVACOR) 20 MG tablet Take 20 mg by mouth at bedtime.    . Multiple Vitamin (MULTI-VITAMINS) TABS Take by mouth.    . pregabalin (LYRICA) 75 MG capsule Take 1 capsule (75 mg total) by mouth 2 (two) times daily. 60 capsule 3   No current facility-administered medications for this visit.    OBJECTIVE:  Filed Vitals:   01/11/15 1122  BP: 129/72  Pulse: 60  Temp: 95.7 F (35.4 C)     Body mass index is 29.14 kg/(m^2).    ECOG FS:1 - Symptomatic but completely ambulatory  PHYSICAL EXAM: GENERAL:  Well developed, well nourished, sitting comfortably in the exam room in no acute distress. MENTAL STATUS:  Alert and oriented to person, place and time. HEAD: Normocephalic, atraumatic, face symmetric, no Cushingoid features. EYES:  .  Pupils equal round and reactive to light and accomodation.  No conjunctivitis or scleral icterus. ENT:  Oropharynx clear without lesion.  Tongue normal. Mucous membranes moist.  RESPIRATORY:  Clear to auscultation without rales, wheezes or rhonchi. CARDIOVASCULAR:  Regular rate and rhythm without murmur, rub or gallop. BREAST:  Right breast without masses, skin changes or nipple discharge.  Left breast without masses, skin changes or nipple discharge. ABDOMEN:  Soft, non-tender, with active bowel sounds, and no hepatosplenomegaly.  No masses. Colostomy is functioning very well.  Patient recently had hernia repair and wound is healing well BACK:  No CVA tenderness.  No tenderness on percussion of the back or rib cage. SKIN:  No rashes, ulcers or lesions. EXTREMITIES: No edema, no skin discoloration or tenderness.  No palpable cords. LYMPH NODES: No palpable cervical, supraclavicular, axillary or inguinal adenopathy  NEUROLOGICAL: For her neuropathy.  Sensory neuropathy grade 2 without any motor weakness PSYCH:   Appropriate.   LAB RESULTS:  Appointment on 01/11/2015  Component Date Value Ref Range Status  . WBC 01/11/2015 7.0  3.6 - 11.0 K/uL Final  . RBC 01/11/2015 3.95  3.80 - 5.20 MIL/uL Final  . Hemoglobin 01/11/2015 13.7  12.0 - 16.0 g/dL Final  . HCT 01/11/2015 39.4  35.0 - 47.0 % Final  . MCV 01/11/2015 99.8  80.0 - 100.0 fL Final  . MCH 01/11/2015 34.8* 26.0 - 34.0 pg Final  . MCHC 01/11/2015 34.9  32.0 - 36.0 g/dL Final  . RDW 01/11/2015 13.4  11.5 - 14.5 % Final  . Platelets 01/11/2015 265  150 - 440 K/uL Final  . Neutrophils Relative % 01/11/2015 73   Final  . Neutro Abs 01/11/2015 5.1  1.4 - 6.5 K/uL Final  . Lymphocytes Relative 01/11/2015 16   Final  . Lymphs Abs 01/11/2015 1.1  1.0 - 3.6 K/uL Final  . Monocytes Relative 01/11/2015 9   Final  .  Monocytes Absolute 01/11/2015 0.6  0.2 - 0.9 K/uL Final  . Eosinophils Relative 01/11/2015 1   Final  . Eosinophils Absolute 01/11/2015 0.0  0 - 0.7 K/uL Final  . Basophils Relative 01/11/2015 1   Final  . Basophils Absolute 01/11/2015 0.1  0 - 0.1 K/uL Final  . Sodium 01/11/2015 139  135 - 145 mmol/L Final  . Potassium 01/11/2015 4.4  3.5 - 5.1 mmol/L Final  . Chloride 01/11/2015 105  101 - 111 mmol/L Final  . CO2 01/11/2015 28  22 - 32 mmol/L Final  . Glucose, Bld 01/11/2015 117* 65 - 99 mg/dL Final  . BUN 01/11/2015 22* 6 - 20 mg/dL Final  . Creatinine, Ser 01/11/2015 0.89  0.44 - 1.00 mg/dL Final  . Calcium 01/11/2015 9.0  8.9 - 10.3 mg/dL Final  . Total Protein 01/11/2015 7.0  6.5 - 8.1 g/dL Final  . Albumin 01/11/2015 3.8  3.5 - 5.0 g/dL Final  . AST 01/11/2015 30  15 - 41 U/L Final  . ALT 01/11/2015 31  14 - 54 U/L Final  . Alkaline Phosphatase 01/11/2015 65  38 - 126 U/L Final  . Total Bilirubin 01/11/2015 0.8  0.3 - 1.2 mg/dL Final  . GFR calc non Af Amer 01/11/2015 >60  >60 mL/min Final  . GFR calc Af Amer 01/11/2015 >60  >60 mL/min Final   Comment: (NOTE) The eGFR has been calculated using the CKD EPI equation. This  calculation has not been validated in all clinical situations. eGFR's persistently <60 mL/min signify possible Chronic Kidney Disease.   . Anion gap 01/11/2015 6  5 - 15 Final    No results found for: LABCA2 No results found for: CA199 Lab Results  Component Value Date   CEA 2.1 06/21/2014     ASSESSMENT: Carcinoma of rectum stage III disease status post radiation chemotherapy at present time there is no evidence of recurrent disease Peripheral neuropathy grade 2  Persist On clinical examination there is no evidence of recurrent or progressive disease CEA is pending   all other lab data has been reviewed We will try to wean patient off lady, patient was advised to take 1 by mouth every day for another month and if neuropathy symptoms does not get worse then discontinue and monitor symptoms. If there is any exacerbation of the neuropathy symptoms Patient should go back on Lyrica She   was also referred to GI clinic for possible colonoscopy     Christina Gleason, MD   01/11/2015 11:27 AM

## 2015-01-12 LAB — CEA: CEA: 1.8 ng/mL (ref 0.0–4.7)

## 2015-01-25 ENCOUNTER — Inpatient Hospital Stay: Payer: Medicare Other

## 2015-01-25 DIAGNOSIS — Z85048 Personal history of other malignant neoplasm of rectum, rectosigmoid junction, and anus: Secondary | ICD-10-CM | POA: Diagnosis not present

## 2015-01-25 DIAGNOSIS — C189 Malignant neoplasm of colon, unspecified: Secondary | ICD-10-CM

## 2015-01-25 MED ORDER — HEPARIN SOD (PORK) LOCK FLUSH 100 UNIT/ML IV SOLN
500.0000 [IU] | Freq: Once | INTRAVENOUS | Status: AC
  Administered 2015-01-25: 500 [IU] via INTRAVENOUS

## 2015-01-25 MED ORDER — HEPARIN SOD (PORK) LOCK FLUSH 100 UNIT/ML IV SOLN
INTRAVENOUS | Status: AC
  Filled 2015-01-25: qty 5

## 2015-01-25 MED ORDER — SODIUM CHLORIDE 0.9 % IJ SOLN
10.0000 mL | Freq: Once | INTRAMUSCULAR | Status: AC
  Administered 2015-01-25: 10 mL via INTRAVENOUS
  Filled 2015-01-25: qty 10

## 2015-02-14 ENCOUNTER — Encounter: Payer: Self-pay | Admitting: Internal Medicine

## 2015-02-14 ENCOUNTER — Ambulatory Visit
Admission: RE | Admit: 2015-02-14 | Discharge: 2015-02-14 | Disposition: A | Discharge status: Home / Self Care | Payer: Medicare Other | Source: Ambulatory Visit | Attending: Internal Medicine | Admitting: Internal Medicine

## 2015-02-14 ENCOUNTER — Ambulatory Visit (INDEPENDENT_AMBULATORY_CARE_PROVIDER_SITE_OTHER): Payer: Medicare Other | Admitting: Internal Medicine

## 2015-02-14 VITALS — BP 130/70 | HR 73 | Temp 97.8°F | Resp 18 | Ht 63.0 in | Wt 168.2 lb

## 2015-02-14 DIAGNOSIS — R739 Hyperglycemia, unspecified: Secondary | ICD-10-CM

## 2015-02-14 DIAGNOSIS — M25562 Pain in left knee: Secondary | ICD-10-CM

## 2015-02-14 DIAGNOSIS — M25512 Pain in left shoulder: Secondary | ICD-10-CM

## 2015-02-14 DIAGNOSIS — M25519 Pain in unspecified shoulder: Secondary | ICD-10-CM | POA: Insufficient documentation

## 2015-02-14 DIAGNOSIS — M25569 Pain in unspecified knee: Secondary | ICD-10-CM | POA: Insufficient documentation

## 2015-02-14 DIAGNOSIS — R0989 Other specified symptoms and signs involving the circulatory and respiratory systems: Secondary | ICD-10-CM

## 2015-02-14 DIAGNOSIS — G629 Polyneuropathy, unspecified: Secondary | ICD-10-CM

## 2015-02-14 DIAGNOSIS — C189 Malignant neoplasm of colon, unspecified: Secondary | ICD-10-CM

## 2015-02-14 DIAGNOSIS — I10 Essential (primary) hypertension: Secondary | ICD-10-CM

## 2015-02-14 DIAGNOSIS — E7800 Pure hypercholesterolemia, unspecified: Secondary | ICD-10-CM

## 2015-02-14 NOTE — Progress Notes (Signed)
[name], female   DOB: 27-Feb-1935, 80 y.o.   MRN: [id number]   Subjective:    [name], female    DOB: 1935-10-21, 80 y.o.   MRN: [id number]  HPI  Patient with past history of hypercholesterolemia, colon cancer, neuropathy and hypertension.  She comes in today to follow up on these issues.  States over the summer, she fell on her left knee.  Pain worsens with bending.  She injured her left shoulder - raking leaves.  Sore over the last month.  If she passively moves her arm, pain is better.  No chest pain or tightness.  Breathing stable.  No abdominal pain or cramping.  Bowels stable.  No urine change.     Past Medical History  Diagnosis Date  . Hypertension   . Hypercholesteremia   . Colon cancer ([place])   . History of shingles     twice  . Urine incontinence 03/06/2013    h/o  . Nephrolithiasis   . Neuropathy ([place])   . Left carotid bruit    Past Surgical History  Procedure Laterality Date  . Cesarean section    . Appendectomy  1979    colorectal  . Ventral hernia repair Right [id number]  . Surgery for colorectal cancer    . Colostomy     Family History  Problem Relation Age of Onset  . Hypertension Mother   . Hypertension Father   . Breast cancer Paternal Aunt    Social History   Social History  . Marital Status: Widowed    Spouse Name: N/A  . Number of Children: N/A  . Years of Education: N/A   Social History Main Topics  . Smoking status: Never Smoker   . Smokeless tobacco: Never Used  . Alcohol Use: No  . Drug Use: No  . Sexual Activity: Not Asked   Other Topics Concern  . None   Social History Narrative    Outpatient Encounter Prescriptions as of 02/14/2015  Medication Sig  . aspirin EC 81 MG tablet Take 81 mg by mouth daily.  Marland Kitchen b complex vitamins tablet Take 1 tablet by mouth daily.  . calcium citrate-vitamin D (CITRACAL+D) 315-200 MG-UNIT per tablet Take 1 tablet by mouth 2 (two) times daily.  Marland Kitchen  glucosamine-chondroitin 500-400 MG tablet Take 1 tablet by mouth 2 (two) times daily.  Marland Kitchen loratadine (CLARITIN) 10 MG tablet Take 10 mg by mouth daily as needed.   Marland Kitchen losartan-hydrochlorothiazide (HYZAAR) 50-12.5 MG per tablet Take 1 tablet by mouth daily.  Marland Kitchen lovastatin (MEVACOR) 20 MG tablet Take 20 mg by mouth at bedtime.  . Multiple Vitamin (MULTI-VITAMINS) TABS Take by mouth.  . pregabalin (LYRICA) 75 MG capsule Take 1 capsule (75 mg total) by mouth 2 (two) times daily. (Patient taking differently: Take 75 mg by mouth at bedtime. )   No facility-administered encounter medications on file as of 02/14/2015.    Review of Systems  Constitutional: Negative for appetite change and unexpected weight change.  HENT: Negative for congestion and sinus pressure.   Respiratory: Negative for cough, chest tightness and shortness of breath.   Cardiovascular: Negative for chest pain, palpitations and leg swelling.  Gastrointestinal: Negative for nausea, vomiting, abdominal pain and diarrhea.  Genitourinary: Negative for dysuria and difficulty urinating.  Musculoskeletal: Negative for back pain and joint swelling.  Skin: Negative for color change and rash.  Neurological: Negative for dizziness, light-headedness and headaches.  Psychiatric/Behavioral: Negative for dysphoric mood and agitation.  Objective:     Blood pressure rechecked by me:  134/64  Physical Exam  Constitutional: She appears well-developed and well-nourished. No distress.  HENT:  Nose: Nose normal.  Mouth/Throat: Oropharynx is clear and moist.  Eyes: Conjunctivae are normal. Right eye exhibits no discharge. Left eye exhibits no discharge.  Neck: Neck supple. No thyromegaly present.  Cardiovascular: Normal rate and regular rhythm.   Pulmonary/Chest: Breath sounds normal. No respiratory distress. She has no wheezes.  Abdominal: Soft. Bowel sounds are normal. There is no tenderness.  Musculoskeletal: She exhibits no edema or  tenderness.  Increased pain with abduction of her left arm.  Decreased pain with passive range of motion.   Lymphadenopathy:    She has no cervical adenopathy.  Skin: No rash noted. No erythema.  Psychiatric: She has a normal mood and affect. Her behavior is normal.    BP 130/70 mmHg  Pulse 73  Temp(Src) 97.8 F (36.6 C) (Oral)  Resp 18  Ht 5\' 3"  (1.6 m)  Wt 168 lb 4 oz (76.318 kg)  BMI 29.81 kg/m2  SpO2 98% Wt Readings from Last 3 Encounters:  02/14/15 168 lb 4 oz (76.318 kg)  01/11/15 164 lb 7.4 oz (74.6 kg)  10/12/14 166 lb (75.297 kg)     Lab Results  Component Value Date   WBC 7.0 01/11/2015   HGB 13.7 01/11/2015   HCT 39.4 01/11/2015   PLT 265 01/11/2015   GLUCOSE 139* 02/15/2015   CHOL 106 02/15/2015   TRIG 80.0 02/15/2015   HDL 35.90* 02/15/2015   LDLCALC 54 02/15/2015   ALT 31 01/11/2015   AST 30 01/11/2015   NA 143 02/15/2015   K 4.9 02/15/2015   CL 105 02/15/2015   CREATININE 0.88 02/15/2015   BUN 19 02/15/2015   CO2 33* 02/15/2015   TSH 2.88 02/15/2015   INR 1.1 10/21/2012   HGBA1C 5.9 02/15/2015    Mm Digital Screening Bilateral  12/06/2014  CLINICAL DATA:  Screening. EXAM: DIGITAL SCREENING BILATERAL MAMMOGRAM WITH CAD COMPARISON:  Previous exam(s). ACR Breast Density Category c: The breast tissue is heterogeneously dense, which may obscure small masses. FINDINGS: There are no findings suspicious for malignancy. Images were processed with CAD. IMPRESSION: No mammographic evidence of malignancy. A result letter of this screening mammogram will be mailed directly to the patient. RECOMMENDATION: Screening mammogram in one year. (Code:SM-B-01Y) BI-RADS CATEGORY  1: Negative. Electronically Signed   By: Lovey Newcomer M.D.   On: 12/06/2014 11:06       Assessment & Plan:   Problem List Items Addressed This Visit    Colon cancer [facility])    S/p XRT and chemo.  Continues to f/u at the cancer center.  Sees Dr Tamala Julian.  Has ostomy.        Essential hypertension     Blood pressure under good control.  Continue same medication regimen.  Follow pressures.  Follow metabolic panel.        Relevant Orders   Basic metabolic panel   Hypercholesterolemia    On lovastatin.  Low cholesterol diet and exercise.  Follow lipid panel and liver function tests.        Relevant Orders   Lipid panel   Hepatic function panel   Left carotid bruit    Need results of carotid ultrasound.        Left knee pain    Persistent s/p injury.  Check xray.       Relevant Orders   DG Knee 1-2 Views  Left (Completed)   Left shoulder pain - Primary    Increased pain and pain with rom - left shoulder.  Check xray.        Relevant Orders   DG Shoulder Left (Completed)   Neuropathy ([place])    On gabapentin.         Other Visit Diagnoses    Hyperglycemia        Relevant Orders    Hemoglobin A1c        Einar Pheasant, MD

## 2015-02-14 NOTE — Progress Notes (Signed)
Pre-visit discussion using our clinic review tool. No additional management support is needed unless otherwise documented below in the visit note.  

## 2015-02-15 ENCOUNTER — Other Ambulatory Visit (INDEPENDENT_AMBULATORY_CARE_PROVIDER_SITE_OTHER): Payer: Medicare Other

## 2015-02-15 ENCOUNTER — Telehealth: Payer: Self-pay | Admitting: *Deleted

## 2015-02-15 DIAGNOSIS — C189 Malignant neoplasm of colon, unspecified: Secondary | ICD-10-CM

## 2015-02-15 DIAGNOSIS — M25512 Pain in left shoulder: Secondary | ICD-10-CM

## 2015-02-15 DIAGNOSIS — R739 Hyperglycemia, unspecified: Secondary | ICD-10-CM

## 2015-02-15 DIAGNOSIS — E7800 Pure hypercholesterolemia, unspecified: Secondary | ICD-10-CM

## 2015-02-15 DIAGNOSIS — M25562 Pain in left knee: Secondary | ICD-10-CM

## 2015-02-15 DIAGNOSIS — I10 Essential (primary) hypertension: Secondary | ICD-10-CM

## 2015-02-15 LAB — BASIC METABOLIC PANEL
BUN: 19 mg/dL (ref 6–23)
CHLORIDE: 105 meq/L (ref 96–112)
CO2: 33 meq/L — AB (ref 19–32)
Calcium: 9 mg/dL (ref 8.4–10.5)
Creatinine, Ser: 0.88 mg/dL (ref 0.40–1.20)
GFR: 66.15 mL/min (ref >60.00)
GLUCOSE: 139 mg/dL — AB (ref 70–99)
POTASSIUM: 4.9 meq/L (ref 3.5–5.1)
SODIUM: 143 meq/L (ref 135–145)

## 2015-02-15 LAB — LIPID PANEL
CHOLESTEROL: 106 mg/dL (ref 0–200)
HDL: 35.9 mg/dL — AB (ref >39.00)
LDL Cholesterol: 54 mg/dL (ref 0–99)
NonHDL: 70.12
TRIGLYCERIDES: 80 mg/dL (ref 0.0–149.0)
Total CHOL/HDL Ratio: 3
VLDL: 16 mg/dL (ref 0.0–40.0)

## 2015-02-15 LAB — TSH: TSH: 2.88 u[IU]/mL (ref 0.35–4.50)

## 2015-02-15 LAB — HEMOGLOBIN A1C: Hgb A1c MFr Bld: 5.9 % (ref 4.6–6.5)

## 2015-02-15 NOTE — Telephone Encounter (Signed)
I have placed the order for the referral.  Someone will be calling her with an appt date and time.  Tylenol ES 2 tablets tid.  They should be able to see her soon.

## 2015-02-15 NOTE — Telephone Encounter (Signed)
Orders placed for labs

## 2015-02-15 NOTE — Telephone Encounter (Signed)
Patient stated she received a call about results,she was not sure if it was her Xray or labs from this morning. Contact [phone]

## 2015-02-15 NOTE — Telephone Encounter (Signed)
Labs and dx?  

## 2015-02-15 NOTE — Telephone Encounter (Signed)
Pt want to know if you can prescribe something for pain until she see ortho, and does she need to schedule her appointment Dr. Tamala Julian  And she wants to know does the office schedule her appointment with Dr. Hal Hope

## 2015-02-15 NOTE — Telephone Encounter (Signed)
Notified the pt of Dr Lars Mage message and about the pain medication, pt verbalized understanding./tvw

## 2015-02-16 ENCOUNTER — Encounter: Payer: Self-pay | Admitting: *Deleted

## 2015-02-20 ENCOUNTER — Encounter: Payer: Self-pay | Admitting: Internal Medicine

## 2015-02-20 NOTE — Assessment & Plan Note (Signed)
On gabapentin.  

## 2015-02-20 NOTE — Assessment & Plan Note (Signed)
S/p XRT and chemo.  Continues to f/u at the cancer center.  Sees Dr Tamala Julian.  Has ostomy.

## 2015-02-20 NOTE — Assessment & Plan Note (Signed)
Blood pressure under good control.  Continue same medication regimen.  Follow pressures.  Follow metabolic panel.   

## 2015-02-20 NOTE — Assessment & Plan Note (Signed)
Persistent s/p injury.  Check xray.

## 2015-02-20 NOTE — Assessment & Plan Note (Signed)
On lovastatin.  Low cholesterol diet and exercise.  Follow lipid panel and liver function tests.   

## 2015-02-20 NOTE — Assessment & Plan Note (Signed)
Need results of carotid ultrasound.

## 2015-02-20 NOTE — Assessment & Plan Note (Signed)
Increased pain and pain with rom - left shoulder.  Check xray.

## 2015-02-24 ENCOUNTER — Other Ambulatory Visit: Payer: Self-pay

## 2015-02-24 MED ORDER — LOSARTAN POTASSIUM-HCTZ 50-12.5 MG PO TABS: 1.0000 | ORAL_TABLET | Freq: Every day | ORAL | Status: DC

## 2015-02-24 NOTE — Telephone Encounter (Signed)
Pt need refill on Lovastatin

## 2015-02-25 ENCOUNTER — Other Ambulatory Visit: Payer: Self-pay

## 2015-02-25 MED ORDER — LOVASTATIN 20 MG PO TABS
20.0000 mg | ORAL_TABLET | Freq: Every day | ORAL | Status: DC
Start: 2015-02-25 — End: 2016-01-26

## 2015-02-25 NOTE — Telephone Encounter (Signed)
Lovastatin filled

## 2015-02-28 ENCOUNTER — Ambulatory Visit: Payer: Medicare Other | Admitting: Family Medicine

## 2015-03-02 DIAGNOSIS — Z85038 Personal history of other malignant neoplasm of large intestine: Secondary | ICD-10-CM | POA: Diagnosis not present

## 2015-03-07 DIAGNOSIS — Z933 Colostomy status: Secondary | ICD-10-CM | POA: Diagnosis not present

## 2015-03-07 DIAGNOSIS — Z85038 Personal history of other malignant neoplasm of large intestine: Secondary | ICD-10-CM | POA: Diagnosis not present

## 2015-03-08 ENCOUNTER — Inpatient Hospital Stay: Payer: Medicare Other | Attending: Oncology

## 2015-03-08 DIAGNOSIS — Z85038 Personal history of other malignant neoplasm of large intestine: Secondary | ICD-10-CM | POA: Insufficient documentation

## 2015-03-08 DIAGNOSIS — Z95828 Presence of other vascular implants and grafts: Secondary | ICD-10-CM

## 2015-03-08 MED ORDER — SODIUM CHLORIDE 0.9% FLUSH
10.0000 mL | INTRAVENOUS | Status: DC | PRN
  Administered 2015-03-08: 10 mL via INTRAVENOUS
  Filled 2015-03-08: qty 10

## 2015-03-08 MED ORDER — HEPARIN SOD (PORK) LOCK FLUSH 100 UNIT/ML IV SOLN
500.0000 [IU] | Freq: Once | INTRAVENOUS | Status: AC
  Administered 2015-03-08: 500 [IU] via INTRAVENOUS

## 2015-03-18 ENCOUNTER — Encounter: Payer: Self-pay | Admitting: *Deleted

## 2015-03-21 ENCOUNTER — Ambulatory Visit
Admission: RE | Admit: 2015-03-21 | Discharge: 2015-03-21 | Disposition: A | Discharge status: Home Health | Payer: Medicare Other | Source: Ambulatory Visit | Attending: Gastroenterology | Admitting: Gastroenterology

## 2015-03-21 ENCOUNTER — Encounter: Payer: Self-pay | Admitting: Anesthesiology

## 2015-03-21 ENCOUNTER — Encounter
Admission: RE | Disposition: A | Discharge status: Home Health | Payer: Self-pay | Source: Ambulatory Visit | Attending: Gastroenterology

## 2015-03-21 ENCOUNTER — Ambulatory Visit: Payer: Medicare Other | Admitting: *Deleted

## 2015-03-21 DIAGNOSIS — Z933 Colostomy status: Secondary | ICD-10-CM | POA: Diagnosis not present

## 2015-03-21 DIAGNOSIS — E118 Type 2 diabetes mellitus with unspecified complications: Secondary | ICD-10-CM | POA: Insufficient documentation

## 2015-03-21 DIAGNOSIS — I10 Essential (primary) hypertension: Secondary | ICD-10-CM | POA: Diagnosis not present

## 2015-03-21 DIAGNOSIS — K5790 Diverticulosis of intestine, part unspecified, without perforation or abscess without bleeding: Secondary | ICD-10-CM | POA: Diagnosis not present

## 2015-03-21 DIAGNOSIS — K5730 Diverticulosis of large intestine without perforation or abscess without bleeding: Secondary | ICD-10-CM | POA: Insufficient documentation

## 2015-03-21 DIAGNOSIS — Z923 Personal history of irradiation: Secondary | ICD-10-CM | POA: Diagnosis not present

## 2015-03-21 DIAGNOSIS — Z85048 Personal history of other malignant neoplasm of rectum, rectosigmoid junction, and anus: Secondary | ICD-10-CM | POA: Diagnosis not present

## 2015-03-21 DIAGNOSIS — Z9221 Personal history of antineoplastic chemotherapy: Secondary | ICD-10-CM | POA: Diagnosis not present

## 2015-03-21 DIAGNOSIS — Z85038 Personal history of other malignant neoplasm of large intestine: Secondary | ICD-10-CM | POA: Insufficient documentation

## 2015-03-21 DIAGNOSIS — Z980 Intestinal bypass and anastomosis status: Secondary | ICD-10-CM | POA: Diagnosis not present

## 2015-03-21 DIAGNOSIS — Z7982 Long term (current) use of aspirin: Secondary | ICD-10-CM | POA: Diagnosis not present

## 2015-03-21 DIAGNOSIS — Z79899 Other long term (current) drug therapy: Secondary | ICD-10-CM | POA: Insufficient documentation

## 2015-03-21 DIAGNOSIS — E7800 Pure hypercholesterolemia, unspecified: Secondary | ICD-10-CM | POA: Diagnosis not present

## 2015-03-21 DIAGNOSIS — Z08 Encounter for follow-up examination after completed treatment for malignant neoplasm: Secondary | ICD-10-CM | POA: Diagnosis not present

## 2015-03-21 HISTORY — PX: COLONOSCOPY WITH PROPOFOL: SHX5780

## 2015-03-21 LAB — HM COLONOSCOPY

## 2015-03-21 SURGERY — COLONOSCOPY WITH PROPOFOL
Anesthesia: General

## 2015-03-21 MED ORDER — SODIUM CHLORIDE 0.9 % IV SOLN: INTRAVENOUS | Status: DC

## 2015-03-21 MED ORDER — SODIUM CHLORIDE 0.9 % IV SOLN
INTRAVENOUS | Status: DC
  Administered 2015-03-21: 1000 mL via INTRAVENOUS
  Administered 2015-03-21: 14:00:00 via INTRAVENOUS

## 2015-03-21 MED ORDER — PROPOFOL 500 MG/50ML IV EMUL
INTRAVENOUS | Status: DC | PRN
  Administered 2015-03-21: 50 ug/kg/min via INTRAVENOUS

## 2015-03-21 NOTE — Op Note (Signed)
[facility] Gastroenterology Patient Name: Christina Mathis Procedure Date: 03/21/2015 2:30 PM MRN: [id number] Account #: [id number] Date of Birth: [birth year] Admit Type: Outpatient Age: 80 Room: [facility] ENDO ROOM 4 Gender: Female Note Status: Finalized Procedure:            Colonoscopy Indications:          Personal history of malignant neoplasm of the colon,                        s/p surgery and colostomy Providers:            Lupita Dawn. Candace Cruise, MD Referring MD:         Einar Pheasant, MD (Referring MD) Medicines:            Monitored Anesthesia Care Complications:        No immediate complications. Procedure:            Pre-Anesthesia Assessment:                       - Prior to the procedure, a History and Physical was                        performed, and patient medications, allergies and                        sensitivities were reviewed. The patient's tolerance of                        previous anesthesia was reviewed.                       - The risks and benefits of the procedure and the                        sedation options and risks were discussed with the                        patient. All questions were answered and informed                        consent was obtained.                       - After reviewing the risks and benefits, the patient                        was deemed in satisfactory condition to undergo the                        procedure.                       After obtaining informed consent, the colonoscope was                        passed under direct vision. Throughout the procedure,                        the patient's blood pressure, pulse, and oxygen  saturations were monitored continuously. The Olympus                        CF-Q160AL colonoscope (S#. [id number]) was introduced                        through the anus and advanced to the the cecum,                        identified by appendiceal orifice and ileocecal  valve.                        The colonoscopy was performed without difficulty. The                        patient tolerated the procedure well. The quality of                        the bowel preparation was good. Findings:      Scattered small-mouthed diverticula were found in the sigmoid colon.       Scope passed through the colostomy.      The exam was otherwise without abnormality. Impression:           - Diverticulosis in the sigmoid colon.                       - The examination was otherwise normal.                       - No specimens collected. Recommendation:       - Discharge patient to home.                       - Repeat colonoscopy in 5 years for surveillance.                       - The findings and recommendations were discussed with                        the patient. Procedure Code(s):    --- Professional ---                       [phone], Colonoscopy, flexible; diagnostic, including                        collection of specimen(s) by brushing or washing, when                        performed (separate procedure) Diagnosis Code(s):    --- Professional ---                       [id number], Personal history of other malignant neoplasm                        of large intestine                       K57.30, Diverticulosis of large intestine without                        perforation or  abscess without bleeding CPT copyright 2016 [employer] Medical [employer]. All rights reserved. The codes documented in this report are preliminary and upon coder review may  be revised to meet current compliance requirements. Hulen Luster, MD 03/21/2015 2:50:56 PM This report has been signed electronically. Number of Addenda: 0 Note Initiated On: 03/21/2015 2:30 PM Scope Withdrawal Time: 0 hours 2 minutes 34 seconds  Total Procedure Duration: 0 hours 7 minutes 39 seconds       [facility]

## 2015-03-21 NOTE — Brief Op Note (Signed)
Scope entered through stoma left abdomen. Colostomy bag clean and dry when scope withdrawn.

## 2015-03-21 NOTE — Transfer of Care (Signed)
Immediate Anesthesia Transfer of Care Note  Patient: Christina Mathis  Procedure(s) Performed: Procedure(s): COLONOSCOPY WITH PROPOFOL (N/A)  Patient Location: PACU  Anesthesia Type:General  Level of Consciousness: awake, alert  and oriented  Airway & Oxygen Therapy: Patient Spontanous Breathing and Patient connected to nasal cannula oxygen  Post-op Assessment: Report given to RN and Post -op Vital signs reviewed and stable  Post vital signs: Reviewed and stable  Last Vitals:  Filed Vitals:   03/21/15 1342  Pulse: 78  Temp: 36.2 C  Resp: 20    Complications: No apparent anesthesia complications

## 2015-03-21 NOTE — Anesthesia Preprocedure Evaluation (Signed)
Anesthesia Evaluation  Patient identified by MRN, date of birth, ID band Patient awake    Reviewed: Allergy & Precautions, H&P , NPO status , Patient's Chart, lab work & pertinent test results, reviewed documented beta blocker date and time   History of Anesthesia Complications Negative for: history of anesthetic complications  Airway Mallampati: II  TM Distance: <3 FB Neck ROM: full    Dental no notable dental hx. (+) Caps, Poor Dentition   Pulmonary neg shortness of breath, neg sleep apnea, neg COPD, Recent URI ,    Pulmonary exam normal breath sounds clear to auscultation       Cardiovascular Exercise Tolerance: Good hypertension, (-) angina(-) CAD, (-) Past MI, (-) Cardiac Stents and (-) CABG Normal cardiovascular exam(-) dysrhythmias (-) Valvular Problems/Murmurs Rhythm:regular Rate:Normal     Neuro/Psych negative neurological ROS  negative psych ROS   GI/Hepatic negative GI ROS, Neg liver ROS,   Endo/Other  negative endocrine ROS  Renal/GU Renal disease (Kidney stone)  negative genitourinary   Musculoskeletal   Abdominal   Peds  Hematology negative hematology ROS (+)   Anesthesia Other Findings Past Medical History:   Hypertension                                                 Hypercholesteremia                                           Colon cancer ([place])                                           History of shingles                                            Comment:twice   Urine incontinence                              03/06/2013     Comment:h/o   Nephrolithiasis                                              Neuropathy (HCC)                                             Left carotid bruit                                           Reproductive/Obstetrics negative OB ROS                             Anesthesia Physical Anesthesia Plan  ASA: II  Anesthesia  Plan: General    Post-op Pain Management:    Induction:   Airway Management Planned:   Additional Equipment:   Intra-op Plan:   Post-operative Plan:   Informed Consent: I have reviewed the patients History and Physical, chart, labs and discussed the procedure including the risks, benefits and alternatives for the proposed anesthesia with the patient or authorized representative who has indicated his/her understanding and acceptance.   Dental Advisory Given  Plan Discussed with: Anesthesiologist, CRNA and Surgeon  Anesthesia Plan Comments:         Anesthesia Quick Evaluation

## 2015-03-21 NOTE — H&P (Signed)
  Date of Initial H&P:03/02/2015 History reviewed, patient examined, no change in status, stable for surgery.

## 2015-03-21 NOTE — Anesthesia Postprocedure Evaluation (Signed)
Anesthesia Post Note  Patient: Christina Mathis  Procedure(s) Performed: Procedure(s) (LRB): COLONOSCOPY WITH PROPOFOL (N/A)  Patient location during evaluation: Endoscopy Anesthesia Type: General Level of consciousness: awake and alert Pain management: pain level controlled Vital Signs Assessment: post-procedure vital signs reviewed and stable Respiratory status: spontaneous breathing, nonlabored ventilation, respiratory function stable and patient connected to nasal cannula oxygen Cardiovascular status: blood pressure returned to baseline and stable Postop Assessment: no signs of nausea or vomiting Anesthetic complications: no    Last Vitals:  Filed Vitals:   03/21/15 1521 03/21/15 1532  BP: 170/84 163/67  Pulse: 72 65  Temp:    Resp: 18 18    Last Pain: There were no vitals filed for this visit.               Martha Clan

## 2015-03-22 ENCOUNTER — Encounter: Payer: Self-pay | Admitting: Gastroenterology

## 2015-03-30 ENCOUNTER — Encounter: Payer: Self-pay | Admitting: Internal Medicine

## 2015-04-06 ENCOUNTER — Telehealth: Payer: Self-pay | Admitting: *Deleted

## 2015-04-06 DIAGNOSIS — G629 Polyneuropathy, unspecified: Secondary | ICD-10-CM

## 2015-04-06 MED ORDER — PREGABALIN 75 MG PO CAPS: 75.0000 mg | ORAL_CAPSULE | Freq: Every day | ORAL | Status: DC

## 2015-04-06 NOTE — Telephone Encounter (Signed)
Patient requesting refill for Lyrica 75 mg.

## 2015-04-06 NOTE — Telephone Encounter (Signed)
Refill has been sent to pharmacy.  

## 2015-04-06 NOTE — Telephone Encounter (Signed)
Called patient and left message that prescription has been called to pharmacy.

## 2015-04-19 ENCOUNTER — Inpatient Hospital Stay: Payer: Medicare Other | Attending: Oncology

## 2015-04-19 DIAGNOSIS — C801 Malignant (primary) neoplasm, unspecified: Secondary | ICD-10-CM

## 2015-04-19 DIAGNOSIS — Z452 Encounter for adjustment and management of vascular access device: Secondary | ICD-10-CM | POA: Diagnosis not present

## 2015-04-19 DIAGNOSIS — Z85048 Personal history of other malignant neoplasm of rectum, rectosigmoid junction, and anus: Secondary | ICD-10-CM | POA: Diagnosis not present

## 2015-04-19 MED ORDER — HEPARIN SOD (PORK) LOCK FLUSH 100 UNIT/ML IV SOLN
500.0000 [IU] | Freq: Once | INTRAVENOUS | Status: AC
  Administered 2015-04-19: 500 [IU] via INTRAVENOUS

## 2015-04-19 MED ORDER — SODIUM CHLORIDE 0.9% FLUSH
10.0000 mL | Freq: Once | INTRAVENOUS | Status: AC
  Administered 2015-04-19: 10 mL via INTRAVENOUS
  Filled 2015-04-19: qty 10

## 2015-04-19 MED ORDER — HEPARIN SOD (PORK) LOCK FLUSH 100 UNIT/ML IV SOLN
INTRAVENOUS | Status: AC
  Filled 2015-04-19: qty 5

## 2015-04-25 DIAGNOSIS — Z85048 Personal history of other malignant neoplasm of rectum, rectosigmoid junction, and anus: Secondary | ICD-10-CM | POA: Diagnosis not present

## 2015-05-09 DIAGNOSIS — Z85038 Personal history of other malignant neoplasm of large intestine: Secondary | ICD-10-CM | POA: Diagnosis not present

## 2015-05-09 DIAGNOSIS — Z933 Colostomy status: Secondary | ICD-10-CM | POA: Diagnosis not present

## 2015-05-27 DIAGNOSIS — Q828 Other specified congenital malformations of skin: Secondary | ICD-10-CM | POA: Diagnosis not present

## 2015-05-27 DIAGNOSIS — L820 Inflamed seborrheic keratosis: Secondary | ICD-10-CM | POA: Diagnosis not present

## 2015-05-31 ENCOUNTER — Inpatient Hospital Stay: Payer: Medicare Other | Attending: Oncology

## 2015-05-31 ENCOUNTER — Telehealth: Payer: Self-pay | Admitting: *Deleted

## 2015-05-31 DIAGNOSIS — C189 Malignant neoplasm of colon, unspecified: Secondary | ICD-10-CM | POA: Diagnosis not present

## 2015-05-31 DIAGNOSIS — Z95828 Presence of other vascular implants and grafts: Secondary | ICD-10-CM

## 2015-05-31 MED ORDER — SODIUM CHLORIDE 0.9% FLUSH
10.0000 mL | INTRAVENOUS | Status: AC | PRN
  Administered 2015-05-31: 10 mL via INTRAVENOUS
  Filled 2015-05-31: qty 10

## 2015-05-31 MED ORDER — HEPARIN SOD (PORK) LOCK FLUSH 100 UNIT/ML IV SOLN
500.0000 [IU] | Freq: Once | INTRAVENOUS | Status: AC
  Administered 2015-05-31: 500 [IU] via INTRAVENOUS

## 2015-05-31 MED ORDER — LIDOCAINE-PRILOCAINE 2.5-2.5 % EX CREA: 1.0000 "application " | TOPICAL_CREAM | CUTANEOUS | Status: DC | PRN

## 2015-05-31 NOTE — Telephone Encounter (Signed)
Pt requests prescription for emla cream. rx has been escribed to [facility] on [address].

## 2015-06-14 ENCOUNTER — Ambulatory Visit (INDEPENDENT_AMBULATORY_CARE_PROVIDER_SITE_OTHER): Payer: Medicare Other | Admitting: Internal Medicine

## 2015-06-14 ENCOUNTER — Encounter: Payer: Self-pay | Admitting: Internal Medicine

## 2015-06-14 VITALS — BP 120/70 | HR 63 | Temp 97.6°F | Resp 18 | Ht 63.0 in | Wt 168.0 lb

## 2015-06-14 DIAGNOSIS — I771 Stricture of artery: Secondary | ICD-10-CM

## 2015-06-14 DIAGNOSIS — E7800 Pure hypercholesterolemia, unspecified: Secondary | ICD-10-CM

## 2015-06-14 DIAGNOSIS — G629 Polyneuropathy, unspecified: Secondary | ICD-10-CM

## 2015-06-14 DIAGNOSIS — R739 Hyperglycemia, unspecified: Secondary | ICD-10-CM

## 2015-06-14 DIAGNOSIS — C189 Malignant neoplasm of colon, unspecified: Secondary | ICD-10-CM

## 2015-06-14 DIAGNOSIS — E2839 Other primary ovarian failure: Secondary | ICD-10-CM | POA: Diagnosis not present

## 2015-06-14 DIAGNOSIS — I10 Essential (primary) hypertension: Secondary | ICD-10-CM

## 2015-06-14 DIAGNOSIS — R0989 Other specified symptoms and signs involving the circulatory and respiratory systems: Secondary | ICD-10-CM

## 2015-06-14 LAB — LIPID PANEL
CHOL/HDL RATIO: 3
Cholesterol: 102 mg/dL (ref 0–200)
HDL: 31.9 mg/dL — ABNORMAL LOW (ref >39.00)
LDL Cholesterol: 52 mg/dL (ref 0–99)
NONHDL: 69.72
TRIGLYCERIDES: 88 mg/dL (ref 0.0–149.0)
VLDL: 17.6 mg/dL (ref 0.0–40.0)

## 2015-06-14 LAB — HEPATIC FUNCTION PANEL
ALK PHOS: 58 U/L (ref 39–117)
ALT: 22 U/L (ref 0–35)
AST: 22 U/L (ref 0–37)
Albumin: 3.8 g/dL (ref 3.5–5.2)
BILIRUBIN DIRECT: 0.2 mg/dL (ref 0.0–0.3)
Total Bilirubin: 0.9 mg/dL (ref 0.2–1.2)
Total Protein: 6.4 g/dL (ref 6.0–8.3)

## 2015-06-14 LAB — BASIC METABOLIC PANEL
BUN: 23 mg/dL (ref 6–23)
CHLORIDE: 106 meq/L (ref 96–112)
CO2: 31 mEq/L (ref 19–32)
Calcium: 9.2 mg/dL (ref 8.4–10.5)
Creatinine, Ser: 0.86 mg/dL (ref 0.40–1.20)
GFR: 67.88 mL/min (ref >60.00)
GLUCOSE: 129 mg/dL — AB (ref 70–99)
POTASSIUM: 4.4 meq/L (ref 3.5–5.1)
Sodium: 142 mEq/L (ref 135–145)

## 2015-06-14 LAB — HEMOGLOBIN A1C: Hgb A1c MFr Bld: 6 % (ref 4.6–6.5)

## 2015-06-14 NOTE — Progress Notes (Signed)
[name], female   DOB: 1935/12/28, 80 y.o.   MRN: [id number]   Subjective:    [name], female    DOB: 1935/06/22, 80 y.o.   MRN: [id number]  HPI  Patient here for a scheduled follow up.  Has a history of rectal cancer.  Saw Dr Tamala Julian recently.  Had colonoscopy 03/2015 - diverticulosis.  Doing well.  Plans to f/u with Dr Oliva Bustard in June.  Will ask about removal of port-a-cath.  Ostomy doing well.  She still has some issues with neuropathy.  Does not like the way lyrica makes her feel.  She is tapering off.  No abdominal pain or cramping.  Is walking.  No cardiac symptoms with increased activity or exertion.  Left shoulder was doing better after injection.  She recently used the weed eater.  Aggravated again.  Not severe.  Desires no further intervention at this time.     Past Medical History  Diagnosis Date  . Hypertension   . Hypercholesteremia   . Colon cancer ([place])   . History of shingles     twice  . Urine incontinence 03/06/2013    h/o  . Nephrolithiasis   . Neuropathy ([place])   . Left carotid bruit    Past Surgical History  Procedure Laterality Date  . Cesarean section    . Appendectomy  1979    colorectal  . Ventral hernia repair Right [id number]  . Surgery for colorectal cancer    . Colostomy    . Colonoscopy with propofol N/A 03/21/2015    Procedure: COLONOSCOPY WITH PROPOFOL;  Surgeon: Hulen Luster, MD;  Location: [facility] ENDOSCOPY;  Service: Gastroenterology;  Laterality: N/A;   Family History  Problem Relation Age of Onset  . Hypertension Mother   . Hypertension Father   . Breast cancer Paternal Aunt    Social History   Social History  . Marital Status: Widowed    Spouse Name: N/A  . Number of Children: N/A  . Years of Education: N/A   Social History Main Topics  . Smoking status: Never Smoker   . Smokeless tobacco: Never Used  . Alcohol Use: No  . Drug Use: No  . Sexual Activity: Not Asked   Other Topics Concern  . None    Social History Narrative    Outpatient Encounter Prescriptions as of 06/14/2015  Medication Sig  . aspirin EC 81 MG tablet Take 81 mg by mouth daily.  Marland Kitchen b complex vitamins tablet Take 1 tablet by mouth daily.  . calcium citrate-vitamin D (CITRACAL+D) 315-200 MG-UNIT per tablet Take 1 tablet by mouth 2 (two) times daily.  Marland Kitchen glucosamine-chondroitin 500-400 MG tablet Take 1 tablet by mouth 2 (two) times daily.  Marland Kitchen lidocaine-prilocaine (EMLA) cream Apply 1 application topically as needed.  . loratadine (CLARITIN) 10 MG tablet Take 10 mg by mouth daily as needed.   Marland Kitchen losartan-hydrochlorothiazide (HYZAAR) 50-12.5 MG tablet Take 1 tablet by mouth daily.  Marland Kitchen lovastatin (MEVACOR) 20 MG tablet Take 1 tablet (20 mg total) by mouth at bedtime.  . Multiple Vitamin (MULTI-VITAMINS) TABS Take by mouth.  . pregabalin (LYRICA) 75 MG capsule Take 1 capsule (75 mg total) by mouth at bedtime.   Facility-Administered Encounter Medications as of 06/14/2015  Medication  . sodium chloride flush (NS) 0.9 % injection 10 mL    Review of Systems  Constitutional: Negative for appetite change and unexpected weight change.  HENT: Negative for congestion and sinus pressure.   Respiratory: Negative  for cough, chest tightness and shortness of breath.   Cardiovascular: Negative for chest pain, palpitations and leg swelling.  Gastrointestinal: Negative for nausea, vomiting, abdominal pain and diarrhea.  Genitourinary: Negative for dysuria and difficulty urinating.  Musculoskeletal: Negative for back pain and joint swelling.  Skin: Negative for color change and rash.  Neurological: Negative for dizziness, light-headedness and headaches.       Neuropathy.   Psychiatric/Behavioral: Negative for dysphoric mood and agitation.       Objective:     Blood pressure rechecked by me:  Right arm 124/62 and left arm 122/62  Physical Exam  Constitutional: She appears well-developed and well-nourished. No distress.  HENT:   Nose: Nose normal.  Mouth/Throat: Oropharynx is clear and moist.  Neck: Neck supple. No thyromegaly present.  Cardiovascular: Normal rate and regular rhythm.   Pulmonary/Chest: Breath sounds normal. No respiratory distress. She has no wheezes.  Abdominal: Soft. Bowel sounds are normal. There is no tenderness.  Musculoskeletal: She exhibits no edema or tenderness.  Lymphadenopathy:    She has no cervical adenopathy.  Skin: No rash noted. No erythema.  Psychiatric: She has a normal mood and affect. Her behavior is normal.    BP 120/70 mmHg  Pulse 63  Temp(Src) 97.6 F (36.4 C) (Oral)  Resp 18  Ht 5\' 3"  (1.6 m)  Wt 168 lb (76.204 kg)  BMI 29.77 kg/m2  SpO2 95% Wt Readings from Last 3 Encounters:  06/14/15 168 lb (76.204 kg)  03/21/15 160 lb (72.576 kg)  02/14/15 168 lb 4 oz (76.318 kg)     Lab Results  Component Value Date   WBC 7.0 01/11/2015   HGB 13.7 01/11/2015   HCT 39.4 01/11/2015   PLT 265 01/11/2015   GLUCOSE 129* 06/14/2015   CHOL 102 06/14/2015   TRIG 88.0 06/14/2015   HDL 31.90* 06/14/2015   LDLCALC 52 06/14/2015   ALT 22 06/14/2015   AST 22 06/14/2015   NA 142 06/14/2015   K 4.4 06/14/2015   CL 106 06/14/2015   CREATININE 0.86 06/14/2015   BUN 23 06/14/2015   CO2 31 06/14/2015   TSH 2.88 02/15/2015   INR 1.1 10/21/2012   HGBA1C 6.0 06/14/2015       Assessment & Plan:   Problem List Items Addressed This Visit    Colon cancer ([place])    S/p XRT and chemo.  Continues to f/u at the cancer center.  Sees Dr Tamala Julian as outlined.  Has ostomy.        Essential hypertension    Blood pressure under good control.  Continue same medication regimen.  Follow pressures.  Follow metabolic panel.        Hypercholesterolemia    Low cholesterol diet and exercise.  On lovastatin.  Follow lipid panel and liver function tests.        Left carotid bruit    Carotid ultrasound revealed <50% internal carotid artery stenosis bilaterally with right subclavian stenosis.   Refer to vascular surgery for evaluation.  Continue lovastatin and risk factor modification.       Neuropathy ([facility])    On gabapentin.        Subclavian arterial stenosis ([place]) - Primary    Refer to vascular surgery.        Relevant Orders   Ambulatory referral to Vascular Surgery    Other Visit Diagnoses    Estrogen deficiency        Relevant Orders    DG Bone Density  Hyperglycemia            Einar Pheasant, MD

## 2015-06-14 NOTE — Progress Notes (Signed)
Pre-visit discussion using our clinic review tool. No additional management support is needed unless otherwise documented below in the visit note.  

## 2015-06-21 ENCOUNTER — Other Ambulatory Visit: Payer: Self-pay | Admitting: Family Medicine

## 2015-06-21 DIAGNOSIS — R0989 Other specified symptoms and signs involving the circulatory and respiratory systems: Secondary | ICD-10-CM

## 2015-06-26 ENCOUNTER — Encounter: Payer: Self-pay | Admitting: Internal Medicine

## 2015-06-26 NOTE — Assessment & Plan Note (Signed)
Low cholesterol diet and exercise.  On lovastatin.  Follow lipid panel and liver function tests.   

## 2015-06-26 NOTE — Assessment & Plan Note (Signed)
Blood pressure under good control.  Continue same medication regimen.  Follow pressures.  Follow metabolic panel.   

## 2015-06-26 NOTE — Assessment & Plan Note (Signed)
Refer to vascular surgery. 

## 2015-06-26 NOTE — Assessment & Plan Note (Signed)
S/p XRT and chemo.  Continues to f/u at the cancer center.  Sees Dr Tamala Julian as outlined.  Has ostomy.

## 2015-06-26 NOTE — Assessment & Plan Note (Signed)
Carotid ultrasound revealed <50% internal carotid artery stenosis bilaterally with right subclavian stenosis.  Refer to vascular surgery for evaluation.  Continue lovastatin and risk factor modification.

## 2015-06-26 NOTE — Assessment & Plan Note (Signed)
On gabapentin.  

## 2015-06-28 ENCOUNTER — Encounter (HOSPITAL_COMMUNITY): Payer: Medicare Other

## 2015-06-28 ENCOUNTER — Encounter: Payer: Medicare Other | Admitting: Vascular Surgery

## 2015-06-28 ENCOUNTER — Encounter: Payer: Self-pay | Admitting: Vascular Surgery

## 2015-07-06 ENCOUNTER — Ambulatory Visit (HOSPITAL_COMMUNITY)
Admission: RE | Admit: 2015-07-06 | Discharge: 2015-07-06 | Disposition: A | Discharge status: Home / Self Care | Payer: Medicare Other | Source: Ambulatory Visit | Attending: Vascular Surgery | Admitting: Vascular Surgery

## 2015-07-06 ENCOUNTER — Ambulatory Visit (INDEPENDENT_AMBULATORY_CARE_PROVIDER_SITE_OTHER): Payer: Medicare Other | Admitting: Vascular Surgery

## 2015-07-06 ENCOUNTER — Encounter: Payer: Self-pay | Admitting: Vascular Surgery

## 2015-07-06 VITALS — BP 136/64 | HR 58 | Temp 97.1°F | Resp 18 | Ht 64.0 in | Wt 169.0 lb

## 2015-07-06 DIAGNOSIS — I739 Peripheral vascular disease, unspecified: Principal | ICD-10-CM

## 2015-07-06 DIAGNOSIS — I779 Disorder of arteries and arterioles, unspecified: Secondary | ICD-10-CM | POA: Insufficient documentation

## 2015-07-06 DIAGNOSIS — I771 Stricture of artery: Secondary | ICD-10-CM | POA: Diagnosis not present

## 2015-07-06 DIAGNOSIS — E7800 Pure hypercholesterolemia, unspecified: Secondary | ICD-10-CM | POA: Insufficient documentation

## 2015-07-06 DIAGNOSIS — I10 Essential (primary) hypertension: Secondary | ICD-10-CM | POA: Insufficient documentation

## 2015-07-06 DIAGNOSIS — R0989 Other specified symptoms and signs involving the circulatory and respiratory systems: Secondary | ICD-10-CM | POA: Diagnosis not present

## 2015-07-06 DIAGNOSIS — I6523 Occlusion and stenosis of bilateral carotid arteries: Secondary | ICD-10-CM | POA: Diagnosis not present

## 2015-07-06 LAB — VAS US CAROTID
LCCADDIAS: 18 cm/s
LEFT ECA DIAS: 0 cm/s
Left CCA dist sys: 104 cm/s
Left CCA prox dias: 16 cm/s
Left CCA prox sys: 131 cm/s
Left ICA dist dias: -36 cm/s
Left ICA dist sys: -179 cm/s
Left ICA prox dias: 26 cm/s
Left ICA prox sys: 117 cm/s
RCCADSYS: -128 cm/s
RCCAPDIAS: 16 cm/s
RCCAPSYS: 110 cm/s
RIGHT CCA MID DIAS: 21 cm/s
RIGHT ECA DIAS: 0 cm/s

## 2015-07-06 NOTE — Patient Instructions (Signed)
Atherosclerosis Atherosclerosis, or hardening of the arteries, is the buildup of plaque within the major arteries in the body. Plaque is made up of fats (lipids), cholesterol, calcium, and fibrous tissue. Plaque can narrow or block blood flow within an artery. Plaque can break off and cause damage to the affected organ. Plaque can also "rupture." When plaque ruptures within an artery, a clot can form, causing a sudden (acute) blockage of the artery. Untreated atherosclerosis can cause serious health problems or death.  RISK FACTORS  High cholesterol levels.  Smoking.  Obesity.  Lack of activity or exercise.  Eating a diet high in saturated fat.  Family history.  Diabetes. SIGNS AND SYMPTOMS  Symptoms of atherosclerosis can occur when blood flow to an artery is slowed or blocked. Severity and onset of symptoms depends on how extensive the narrowing or blockage is. A sudden plaque rupture can bring immediate, life-threatening symptoms. Atherosclerosis can affect different arteries in the body, for example:  Coronary arteries. The coronary arteries supply the heart with blood. When the coronary arteries are narrowed or blocked from atherosclerosis, this is known as coronary artery disease (CAD). CAD can cause a heart attack. Common heart attack symptoms include:  Chest pain or pain that radiates to the neck, arm, jaw, or in the upper, middle back (mid-scapular pain).  Shortness of breath without cause.  Profuse sweating while at rest.  Irregular heartbeats.  Nausea or gastrointestinal upset.  Carotid arteries. The carotid arteries supply the brain with blood. They are located on each side of your neck. When blood flow to these arteries is slowed or blocked, a transient ischemic attack (TIA) or stroke can occur. A TIA is considered a "mini-stroke" or "warning stroke." TIA symptoms are the same as stroke symptoms, but they are temporary and last less than 24 hours. A stroke can cause  permanent damage or death. Common TIA and stroke symptoms include:  Sudden numbness or weakness to one side of your body, such as the face, arm, or leg.  Sudden confusion or trouble speaking or understanding.  Sudden trouble seeing out of one or both eyes.  Sudden trouble walking, loss of balance, or dizziness.  Sudden, severe headache with no known cause.  Arteries in the legs. When arteries in the lower legs become narrowed or blocked, this is known as peripheral vascular disease (PVD). PVD can cause a symptom called claudication. Claudication is pain or a burning feeling in your legs when walking or exercising and usually goes away with rest. Very severe PVD can cause pain in your legs while at rest.  Renal arteries. The renal arteries supply the kidneys with blood. Blockage of the renal arteries can cause a decline in kidney function or high blood pressure (hypertension).  Gastrointestinal arteries (mesenteric circulation). Abdominal pain may occur after eating. DIAGNOSIS  Your health care provider may perform the following tests to diagnose atherosclerosis:  Blood tests.  Stress test.  Echocardiogram.  Nuclear scan.  Ankle/brachial index.  Ultrasonography.  Computed tomography (CT) scan.  Angiography. TREATMENT  Atherosclerosis treatment includes the following:  Lifestyle changes such as:  Quitting smoking. Your health care provider can help you with smoking cessation.  Eating a diet low in saturated fat. A registered dietitian can educate you on healthy food options, such as helping you understand the difference between good fat and bad fat.  Following an exercise program approved by your health care provider.  Maintaining a healthy weight. Lose weight as approved by your health care provider.  Have   your cholesterol levels checked as directed by your health care provider.  Medicines. Cholesterol medicines can help slow or stop the progression of  atherosclerosis.  Different procedural or surgical interventions to treat atherosclerosis include:  Balloon angioplasty. The technical name for balloon angioplasty is percutaneous transluminal angioplasty (PTA). In this procedure, a catheter with a small balloon at the tip is inserted through the blocked or narrowed artery. The balloon is then inflated. When the balloon is inflated, the fatty plaque is compressed against the artery wall, allowing better blood flow within the artery.  Balloon angioplasty and stenting. In this procedure, balloon angioplasty is combined with a stenting procedure. A stent is a small, metal mesh tube that keeps the artery open. After the artery is opened up by the balloon technique, the stent is then deployed. The stent is permanent.  Open heart surgery or bypass surgery. To perform this type of surgery, a healthy vessel is first "harvested" from either the leg or arm. The harvested vessel is then used to "bypass" the blocked atherosclerotic vessel so new blood flow can be established.  Atherectomy. Atherectomy is a procedure that uses a catheter with a sharp blade to remove plaque from an artery. A chamber in the catheter collects the plaque.  Endarterectomy. An endarterectomy is a surgical procedure where a surgeon removes plaque from an artery.  Amputation. When blockages in the lower legs are very severe and circulation cannot be restored, amputation may be required. SEEK IMMEDIATE MEDICAL CARE IF:  You are having heart attack symptoms, such as:  Chest pain or pain that radiates to the neck, arm, jaw, or in the upper, middle back (mid-scapular pain).  Shortness of breath without cause.  Profuse sweating while at rest.  Irregular heartbeats.  Nausea or gastrointestinal upset.  You are having stroke symptoms, such as sudden:  Numbness or weakness to one side of your body, such as the face, arm, or leg.  Confusion or trouble speaking or  understanding.  Trouble seeing out of one or both eyes.  Trouble walking, loss of balance, or dizziness.  Severe headache with no known cause.  Your hands or feet are bluish, cold, or you have pain in them.  You have bad abdominal pain after eating. Symptoms of heart attack or stroke may represent a serious problem that is an emergency. Do not wait to see if the symptoms will go away. Get medical help right away. Call your local emergency services (911 in the [place] Do not drive yourself to the hospital.   This information is not intended to replace advice given to you by your health care provider. Make sure you discuss any questions you have with your health care provider.   Document Released: 04/07/2003 Document Revised: 02/05/2014 Document Reviewed: 03/20/2011 Elsevier Interactive Patient Education 2016 [place].   Heart-Healthy Eating Plan Many factors influence your heart health, including eating and exercise habits. Heart (coronary) risk increases with abnormal blood fat (lipid) levels. Heart-healthy meal planning includes limiting unhealthy fats, increasing healthy fats, and making other small dietary changes. This includes maintaining a healthy body weight to help keep lipid levels within a normal range. WHAT IS MY PLAN?  Your health care provider recommends that you:  Get no more than _________% of the total calories in your daily diet from fat.  Limit your intake of saturated fat to less than _________% of your total calories each day.  Limit the amount of cholesterol in your diet to less than _________ mg per day.  WHAT TYPES OF FAT SHOULD I CHOOSE?  Choose healthy fats more often. Choose monounsaturated and polyunsaturated fats, such as olive oil and canola oil, flaxseeds, walnuts, almonds, and seeds.  Eat more omega-3 fats. Good choices include salmon, mackerel, sardines, tuna, flaxseed oil, and ground flaxseeds. Aim to eat fish at least two times each week.  Limit  saturated fats. Saturated fats are primarily found in animal products, such as meats, butter, and cream. Plant sources of saturated fats include palm oil, palm kernel oil, and coconut oil.  Avoid foods with partially hydrogenated oils in them. These contain trans fats. Examples of foods that contain trans fats are stick margarine, some tub margarines, cookies, crackers, and other baked goods. WHAT GENERAL GUIDELINES DO I NEED TO FOLLOW?  Check food labels carefully to identify foods with trans fats or high amounts of saturated fat.  Fill one half of your plate with vegetables and green salads. Eat 4-5 servings of vegetables per day. A serving of vegetables equals 1 cup of raw leafy vegetables,  cup of raw or cooked cut-up vegetables, or  cup of vegetable juice.  Fill one fourth of your plate with whole grains. Look for the word "whole" as the first word in the ingredient list.  Fill one fourth of your plate with lean protein foods.  Eat 4-5 servings of fruit per day. A serving of fruit equals one medium whole fruit,  cup of dried fruit,  cup of fresh, frozen, or canned fruit, or  cup of 100% fruit juice.  Eat more foods that contain soluble fiber. Examples of foods that contain this type of fiber are apples, broccoli, carrots, beans, peas, and barley. Aim to get 20-30 g of fiber per day.  Eat more home-cooked food and less restaurant, buffet, and fast food.  Limit or avoid alcohol.  Limit foods that are high in starch and sugar.  Avoid fried foods.  Cook foods by using methods other than frying. Baking, boiling, grilling, and broiling are all great options. Other fat-reducing suggestions include:  Removing the skin from poultry.  Removing all visible fats from meats.  Skimming the fat off of stews, soups, and gravies before serving them.  Steaming vegetables in water or broth.  Lose weight if you are overweight. Losing just 5-10% of your initial body weight can help your  overall health and prevent diseases such as diabetes and heart disease.  Increase your consumption of nuts, legumes, and seeds to 4-5 servings per week. One serving of dried beans or legumes equals  cup after being cooked, one serving of nuts equals 1 ounces, and one serving of seeds equals  ounce or 1 tablespoon.  You may need to monitor your salt (sodium) intake, especially if you have high blood pressure. Talk with your health care provider or dietitian to get more information about reducing sodium. WHAT FOODS CAN I EAT? Grains Breads, including [place], white, pita, wheat, raisin, rye, oatmeal, and [place]. Tortillas that are neither fried nor made with lard or trans fat. Low-fat rolls, including hotdog and hamburger buns and English muffins. Biscuits. Muffins. Waffles. Pancakes. Light popcorn. Whole-grain cereals. Flatbread. Melba toast. Pretzels. Breadsticks. Rusks. Low-fat snacks and crackers, including oyster, saltine, matzo, graham, animal, and rye. Rice and pasta, including brown rice and those that are made with whole wheat. Vegetables All vegetables. Fruits All fruits, but limit coconut. Meats and Other Protein Sources Lean, well-trimmed beef, veal, pork, and lamb. Chicken and [place] without skin. All fish and shellfish. Wild duck,  rabbit, pheasant, and venison. Egg whites or low-cholesterol egg substitutes. Dried beans, peas, lentils, and tofu.Seeds and most nuts. Dairy Low-fat or nonfat cheeses, including ricotta, string, and mozzarella. Skim or 1% milk that is liquid, powdered, or evaporated. Buttermilk that is made with low-fat milk. Nonfat or low-fat yogurt. Beverages Mineral water. Diet carbonated beverages. Sweets and Desserts Sherbets and fruit ices. Honey, jam, marmalade, jelly, and syrups. Meringues and gelatins. Pure sugar candy, such as hard candy, jelly beans, gumdrops, mints, marshmallows, and small amounts of dark chocolate. [employer] Eat all sweets and  desserts in moderation. Fats and Oils Nonhydrogenated (trans-free) margarines. Vegetable oils, including soybean, sesame, sunflower, olive, peanut, safflower, corn, canola, and cottonseed. Salad dressings or mayonnaise that are made with a vegetable oil. Limit added fats and oils that you use for cooking, baking, salads, and as spreads. Other Cocoa powder. Coffee and tea. All seasonings and condiments. The items listed above may not be a complete list of recommended foods or beverages. Contact your dietitian for more options. WHAT FOODS ARE NOT RECOMMENDED? Grains Breads that are made with saturated or trans fats, oils, or whole milk. Croissants. Butter rolls. Cheese breads. Sweet rolls. Donuts. Buttered popcorn. Chow mein noodles. High-fat crackers, such as cheese or butter crackers. Meats and Other Protein Sources Fatty meats, such as hotdogs, short ribs, sausage, spareribs, bacon, ribeye roast or steak, and mutton. High-fat deli meats, such as salami and bologna. Caviar. Domestic duck and goose. Organ meats, such as kidney, liver, sweetbreads, brains, gizzard, chitterlings, and heart. Dairy Cream, sour cream, cream cheese, and creamed cottage cheese. Whole milk cheeses, including blue (bleu), Monte[place] Jack, [place], [place], American, [place], [place], [place], [place], and [place]. Whole or 2% milk that is liquid, evaporated, or condensed. Whole buttermilk. Cream sauce or high-fat cheese sauce. Yogurt that is made from whole milk. Beverages Regular sodas and drinks with added sugar. Sweets and Desserts Frosting. Pudding. Cookies. Cakes other than angel food cake. Candy that has milk chocolate or white chocolate, hydrogenated fat, butter, coconut, or unknown ingredients. Buttered syrups. Full-fat ice cream or ice cream drinks. Fats and Oils Gravy that has suet, meat fat, or shortening. Cocoa butter, hydrogenated oils, palm oil, coconut oil, palm kernel oil. These can often be found in baked  products, candy, fried foods, nondairy creamers, and whipped toppings. Solid fats and shortenings, including bacon fat, salt pork, lard, and butter. Nondairy cream substitutes, such as coffee creamers and sour cream substitutes. Salad dressings that are made of unknown oils, cheese, or sour cream. The items listed above may not be a complete list of foods and beverages to avoid. Contact your dietitian for more information.   This information is not intended to replace advice given to you by your health care provider. Make sure you discuss any questions you have with your health care provider.   Document Released: 10/25/2007 Document Revised: 02/05/2014 Document Reviewed: 07/09/2013 Elsevier Interactive Patient Education [employer].

## 2015-07-06 NOTE — Progress Notes (Signed)
New Carotid Patient  Referred by:  Einar Pheasant, MD [id number] [place], [place] [address]  Reason for referral: L carotid bruit and R SCA stenosis  History of Present Illness  Christina Mathis is a 80 y.o. (1935-08-08) female who presents with chief complaint: "funny sound in neck".  Pt was seeing her new PCP when she noticed a L carotid bruit.  Pt also has a history prior R SCA stenosis.  Patient has no history of TIA or stroke symptom.  The patient has never had amaurosis fugax or monocular blindness.  The patient has never had facial drooping or hemiplegia.  The patient has never had receptive or expressive aphasia.   The patient's risks factors for carotid disease include: HTN, HLD.  Pt has known LLE lymphedema after her colorectal cancer resection with lymph node dissection  Past Medical History  Diagnosis Date  . Hypertension   . Hypercholesteremia   . Colon cancer ([place])   . History of shingles     twice  . Urine incontinence 03/06/2013    h/o  . Nephrolithiasis   . Neuropathy ([place])   . Left carotid bruit     Past Surgical History  Procedure Laterality Date  . Cesarean section    . Appendectomy  1979    colorectal  . Ventral hernia repair Right [id number]  . Surgery for colorectal cancer    . Colostomy    . Colonoscopy with propofol N/A 03/21/2015    Procedure: COLONOSCOPY WITH PROPOFOL;  Surgeon: Hulen Luster, MD;  Location: [facility] ENDOSCOPY;  Service: Gastroenterology;  Laterality: N/A;    Social History   Social History  . Marital Status: Widowed    Spouse Name: N/A  . Number of Children: N/A  . Years of Education: N/A   Occupational History  . Not on file.   Social History Main Topics  . Smoking status: Never Smoker   . Smokeless tobacco: Never Used  . Alcohol Use: No  . Drug Use: No  . Sexual Activity: Not on file   Other Topics Concern  . Not on file   Social History Narrative    Family History  Problem Relation Age of  Onset  . Hypertension Mother   . Hypertension Father   . Breast cancer Paternal Aunt     Current Outpatient Prescriptions  Medication Sig Dispense Refill  . aspirin EC 81 MG tablet Take 81 mg by mouth daily.    Marland Kitchen b complex vitamins tablet Take 1 tablet by mouth daily.    . calcium citrate-vitamin D (CITRACAL+D) 315-200 MG-UNIT per tablet Take 1 tablet by mouth 2 (two) times daily.    Marland Kitchen glucosamine-chondroitin 500-400 MG tablet Take 1 tablet by mouth 2 (two) times daily.    Marland Kitchen lidocaine-prilocaine (EMLA) cream Apply 1 application topically as needed. 30 g 3  . loratadine (CLARITIN) 10 MG tablet Take 10 mg by mouth daily as needed.     Marland Kitchen losartan-hydrochlorothiazide (HYZAAR) 50-12.5 MG tablet Take 1 tablet by mouth daily. 90 tablet 3  . lovastatin (MEVACOR) 20 MG tablet Take 1 tablet (20 mg total) by mouth at bedtime. (Patient taking differently: Take 10 mg by mouth. Take half of 20mg  tablet daily) 90 tablet 3  . Multiple Vitamin (MULTI-VITAMINS) TABS Take by mouth.    . pregabalin (LYRICA) 75 MG capsule Take 1 capsule (75 mg total) by mouth at bedtime. 30 capsule 6   No current facility-administered medications for this visit.   Facility-Administered  Medications Ordered in Other Visits  Medication Dose Route Frequency Provider Last Rate Last Dose  . sodium chloride flush (NS) 0.9 % injection 10 mL  10 mL Intravenous PRN Forest Gleason, MD   10 mL at 05/31/15 1040    No Known Allergies   REVIEW OF SYSTEMS:  (Positives checked otherwise negative)  CARDIOVASCULAR:   [ ]  chest pain,  [ ]  chest pressure,  [ ]  palpitations,  [ ]  shortness of breath when laying flat,  [ ]  shortness of breath with exertion,   [ ]  pain in feet when walking,  [ ]  pain in feet when laying flat, [ ]  history of blood clot in veins (DVT),  [ ]  history of phlebitis,  [x]  swelling in legs,  [ ]  varicose veins  PULMONARY:   [ ]  productive cough,  [ ]  asthma,  [ ]  wheezing  NEUROLOGIC:   [ ]  weakness in  arms or legs,  [ ]  numbness in arms or legs,  [ ]  difficulty speaking or slurred speech,  [ ]  temporary loss of vision in one eye,  [ ]  dizziness  HEMATOLOGIC:   [ ]  bleeding problems,  [ ]  problems with blood clotting too easily  MUSCULOSKEL:   [ ]  joint pain, [ ]  joint swelling  GASTROINTEST:   [ ]  vomiting blood,  [ ]  blood in stool     GENITOURINARY:   [ ]  burning with urination,  [ ]  blood in urine  PSYCHIATRIC:   [ ]  history of major depression  INTEGUMENTARY:   [ ]  rashes,  [ ]  ulcers  CONSTITUTIONAL:   [ ]  fever,  [ ]  chills   For VQI Use Only  PRE-ADM LIVING: Home  AMB STATUS: Ambulatory  CAD Sx: None  PRIOR CHF: None  STRESS TEST: [x]  No, [ ]  Normal, [ ]  + ischemia, [ ]  + MI, [ ]  Both   Physical Examination  Filed Vitals:   07/06/15 1537 07/06/15 1542  BP: 148/64 136/64  Pulse: 60 58  Temp: 97.1 F (36.2 C)   TempSrc: Oral   Resp: 18   Height: 5\' 4"  (1.626 m)   Weight: 169 lb (76.658 kg)   SpO2: 98%    Body mass index is 28.99 kg/(m^2).  General: A&O x 3, WDWN  Head: [place]/AT  Ear/Nose/Throat: Hearing grossly intact, nares w/o erythema or drainage, oropharynx w/ Erythema w/o Exudate, Mallampati score: 3  Eyes: PERRLA, EOMI  Neck: Supple, no nuchal rigidity, no palpable LAD, mild kyphosis  Pulmonary: Sym exp, good air movt, CTAB, no rales, rhonchi, & wheezing, healed R infraclavicular incision with palpable mediport  Cardiac: RRR, Nl S1, S2, no Murmurs, rubs or gallops  Vascular: Vessel Right Left  Radial Palpable Palpable  Brachial Palpable Palpable  Carotid Palpable, without bruit Palpable, without bruit  Aorta  Not palpable N/A  Femoral Palpable Palpable  Popliteal Not palpable Not palpable  PT Palpable Palpable  DP Not Palpable Not Palpable   Gastrointestinal: soft, NTND, -G/R, - HSM, - masses, - CVAT B, healing incision, viable LLQ ostomy  Musculoskeletal: M/S 5/5 throughout , Extremities without ischemic changes ,  2nd toe deformity bilaterally, mild B ankle LDS, LLE edema 1+, mild varicosities  Neurologic: CN 2-12 intact , Pain and light touch intact in extremities except decreased sensation in feet, Motor exam as listed above  Psychiatric: Judgment intact, Mood & affect appropriate for pt's clinical situation  Dermatologic: See M/S exam for extremity exam, no rashes otherwise noted  Lymph :  No Cervical, Axillary, or Inguinal lymphadenopathy    Non-Invasive Vascular Imaging  CAROTID DUPLEX (Date: 07/06/2015):   R ICA stenosis: 1-39%  R VA: patent, to and fro  L ICA stenosis: 1-39%  L VA:  patent and antegrade  R SCA 223 c/s  L SCA 196 c/s  R brachial 148/64, L 136/64   Outside Studies/Documentation 4 pages of outside documents were reviewed including: outpatient PCP records.   Medical Decision Making  Christina Mathis is a 80 y.o. female who presents with: B asx ICA stenosis <50%, likely non-hemodynamically significant R SCA stenosis, mild LLE lymphedema, mild BLE CVI,    I don't hear bruits today but I do hear transmitted heart sounds in the neck.  The to-and-fro R vertebral artery flow suggests some right SCA stenosis, but the BP in the right arm is higher than left suggesting this is not hemodynamically significant.  Also the patient is currently asx, so I would just watch this for now.  Based on the patient's vascular studies and examination, I have offered the patient: annual B carotid duplex to watch for B ICA stenosis progression and possible R SCA progression..  I discussed in depth with the patient the nature of atherosclerosis, and emphasized the importance of maximal medical management including strict control of blood pressure, blood glucose, and lipid levels, obtaining regular exercise, antiplatelet agents, and cessation of smoking.   The patient is currently on a statin: Mevacor.  The patient is currently on an anti-platelet: ASA.  The patient is aware that without  maximal medical management the underlying atherosclerotic disease process will progress, limiting the benefit of any interventions.  Thank you for allowing [place] to participate in this patient's care.   Adele Barthel, MD [facility] Office: [phone] Pager: [phone]  07/06/2015, 4:05 PM

## 2015-07-12 ENCOUNTER — Inpatient Hospital Stay (HOSPITAL_BASED_OUTPATIENT_CLINIC_OR_DEPARTMENT_OTHER): Payer: Medicare Other | Admitting: Family Medicine

## 2015-07-12 ENCOUNTER — Ambulatory Visit: Payer: Medicare Other | Admitting: Oncology

## 2015-07-12 ENCOUNTER — Inpatient Hospital Stay: Payer: Medicare Other

## 2015-07-12 ENCOUNTER — Inpatient Hospital Stay: Payer: Medicare Other | Attending: Family Medicine

## 2015-07-12 VITALS — BP 148/75 | HR 64 | Temp 97.0°F | Ht 64.0 in | Wt 168.4 lb

## 2015-07-12 DIAGNOSIS — C801 Malignant (primary) neoplasm, unspecified: Secondary | ICD-10-CM

## 2015-07-12 DIAGNOSIS — I10 Essential (primary) hypertension: Secondary | ICD-10-CM

## 2015-07-12 DIAGNOSIS — Z9049 Acquired absence of other specified parts of digestive tract: Secondary | ICD-10-CM

## 2015-07-12 DIAGNOSIS — R32 Unspecified urinary incontinence: Secondary | ICD-10-CM

## 2015-07-12 DIAGNOSIS — E7800 Pure hypercholesterolemia, unspecified: Secondary | ICD-10-CM

## 2015-07-12 DIAGNOSIS — Z85048 Personal history of other malignant neoplasm of rectum, rectosigmoid junction, and anus: Secondary | ICD-10-CM | POA: Diagnosis not present

## 2015-07-12 DIAGNOSIS — Z7982 Long term (current) use of aspirin: Secondary | ICD-10-CM

## 2015-07-12 DIAGNOSIS — I890 Lymphedema, not elsewhere classified: Secondary | ICD-10-CM | POA: Insufficient documentation

## 2015-07-12 DIAGNOSIS — Z87442 Personal history of urinary calculi: Secondary | ICD-10-CM

## 2015-07-12 DIAGNOSIS — Z9221 Personal history of antineoplastic chemotherapy: Secondary | ICD-10-CM | POA: Insufficient documentation

## 2015-07-12 DIAGNOSIS — R739 Hyperglycemia, unspecified: Secondary | ICD-10-CM | POA: Insufficient documentation

## 2015-07-12 DIAGNOSIS — C189 Malignant neoplasm of colon, unspecified: Secondary | ICD-10-CM

## 2015-07-12 DIAGNOSIS — Z923 Personal history of irradiation: Secondary | ICD-10-CM

## 2015-07-12 DIAGNOSIS — G629 Polyneuropathy, unspecified: Secondary | ICD-10-CM

## 2015-07-12 DIAGNOSIS — C20 Malignant neoplasm of rectum: Secondary | ICD-10-CM

## 2015-07-12 LAB — CBC WITH DIFFERENTIAL/PLATELET
BASOS PCT: 1 %
Basophils Absolute: 0.1 10*3/uL (ref 0–0.1)
EOS ABS: 0.1 10*3/uL (ref 0–0.7)
Eosinophils Relative: 1 %
HCT: 36.1 % (ref 35.0–47.0)
HEMOGLOBIN: 12.6 g/dL (ref 12.0–16.0)
LYMPHS ABS: 0.9 10*3/uL — AB (ref 1.0–3.6)
Lymphocytes Relative: 14 %
MCH: 35 pg — AB (ref 26.0–34.0)
MCHC: 34.9 g/dL (ref 32.0–36.0)
MCV: 100.3 fL — ABNORMAL HIGH (ref 80.0–100.0)
Monocytes Absolute: 0.7 10*3/uL (ref 0.2–0.9)
Monocytes Relative: 10 %
NEUTROS PCT: 74 %
Neutro Abs: 5 10*3/uL (ref 1.4–6.5)
Platelets: 252 10*3/uL (ref 150–440)
RBC: 3.6 MIL/uL — AB (ref 3.80–5.20)
RDW: 13.3 % (ref 11.5–14.5)
WBC: 6.7 10*3/uL (ref 3.6–11.0)

## 2015-07-12 LAB — COMPREHENSIVE METABOLIC PANEL
ALBUMIN: 3.8 g/dL (ref 3.5–5.0)
ALK PHOS: 64 U/L (ref 38–126)
ALT: 23 U/L (ref 14–54)
AST: 25 U/L (ref 15–41)
Anion gap: 6 (ref 5–15)
BUN: 25 mg/dL — ABNORMAL HIGH (ref 6–20)
CALCIUM: 8.4 mg/dL — AB (ref 8.9–10.3)
CO2: 28 mmol/L (ref 22–32)
CREATININE: 0.97 mg/dL (ref 0.44–1.00)
Chloride: 106 mmol/L (ref 101–111)
GFR calc Af Amer: >60 mL/min (ref >60)
GFR calc non Af Amer: 55 mL/min — ABNORMAL LOW (ref >60)
GLUCOSE: 139 mg/dL — AB (ref 65–99)
Potassium: 3.5 mmol/L (ref 3.5–5.1)
SODIUM: 140 mmol/L (ref 135–145)
Total Bilirubin: 0.8 mg/dL (ref 0.3–1.2)
Total Protein: 6.6 g/dL (ref 6.5–8.1)

## 2015-07-12 MED ORDER — SODIUM CHLORIDE 0.9% FLUSH
10.0000 mL | INTRAVENOUS | Status: DC | PRN
  Administered 2015-07-12: 10 mL via INTRAVENOUS
  Filled 2015-07-12: qty 10

## 2015-07-12 MED ORDER — HEPARIN SOD (PORK) LOCK FLUSH 100 UNIT/ML IV SOLN
500.0000 [IU] | Freq: Once | INTRAVENOUS | Status: AC
  Administered 2015-07-12: 500 [IU] via INTRAVENOUS

## 2015-07-12 NOTE — Progress Notes (Signed)
[place]  [phone]  [phone]     Christina Mathis DOB: 18-Jun-1935  MR#: [id number]  LKG#:[id number]  Patient Care Team: Einar Pheasant, MD as PCP - General (Internal Medicine) Forest Gleason, MD (Oncology) Wallene Huh, DPM as Consulting Physician (Podiatry)  CHIEF COMPLAINT:  Chief Complaint  Patient presents with  . Colon Cancer  . Follow-up    INTERVAL HISTORY:  Patient is here for further follow-up regarding carcinoma of the rectum. Patient was originally diagnosed in 2014 and underwent chemotherapy with radiation as well as a colectomy with colostomy placement. Patient overall reports feeling very well. She continues to come and have her Port-A-Cath flushed every 6 weeks. She most recently had a colonoscopy in February 2017 which revealed no abnormality, her next will be due in 5 years. Patient denies any blood in her stool, melena, nausea, vomiting, or diarrhea. Patient does continue to take Lyrica daily at bedtime due to some peripheral neuropathy. Patient reports that she does not feel peripheral neuropathy is improved with the use of Lyrica and would like to discontinue it.  REVIEW OF SYSTEMS:   Review of Systems  Constitutional: Negative for fever, chills, weight loss, malaise/fatigue and diaphoresis.  HENT: Negative.   Eyes: Negative.   Respiratory: Negative for cough, hemoptysis, sputum production, shortness of breath and wheezing.   Cardiovascular: Negative for chest pain, palpitations, orthopnea, claudication, leg swelling and PND.  Gastrointestinal: Negative for heartburn, nausea, vomiting, abdominal pain, diarrhea, constipation, blood in stool and melena.  Genitourinary: Negative.   Musculoskeletal: Negative.   Skin: Negative.   Neurological: Positive for tingling. Negative for dizziness, focal weakness, seizures and weakness.       Peripheral neuropathy  Endo/Heme/Allergies: Does not bruise/bleed easily.    Psychiatric/Behavioral: Negative for depression. The patient is not nervous/anxious and does not have insomnia.     As per HPI. Otherwise, a complete review of systems is negatve.  ONCOLOGY HISTORY: Oncology History   60. 80 year old female with stage III (T3, N1, M0) adenocarcinoma of the rectum for  preop chemoradiation 2. Starting 5-FU by continuous infusion and radiation therapy November 24, 2012 3. Patient is finishing up radiation and chemotherapy on December 15 of 2014 4. Status postsurgery and colostomyFebruary of 2015, pT0 pNO tumor stage 0 5. Post operative adjuvant with FOLFOX march 2015 6.patient finished total 6 cycles of chemotherapy with FOLFOX on August 31, 2013. 7.  January 2 016 [name] had hernia repair     Colon cancer ([place])   11/26/2011 Initial Diagnosis Colon cancer    PAST MEDICAL HISTORY: Past Medical History  Diagnosis Date  . Hypertension   . Hypercholesteremia   . Colon cancer ([place])   . History of shingles     twice  . Urine incontinence 03/06/2013    h/o  . Nephrolithiasis   . Neuropathy ([place])   . Left carotid bruit     PAST SURGICAL HISTORY: Past Surgical History  Procedure Laterality Date  . Cesarean section    . Appendectomy  1979    colorectal  . Ventral hernia repair Right [id number]  . Surgery for colorectal cancer    . Colostomy    . Colonoscopy with propofol N/A 03/21/2015    Procedure: COLONOSCOPY WITH PROPOFOL;  Surgeon: Hulen Luster, MD;  Location: [facility] ENDOSCOPY;  Service: Gastroenterology;  Laterality: N/A;    FAMILY HISTORY Family History  Problem Relation Age of Onset  . Hypertension Mother   . Hypertension Father   . Breast cancer  Paternal Aunt     GYNECOLOGIC HISTORY:  No LMP recorded. Patient is postmenopausal.     ADVANCED DIRECTIVES:    HEALTH MAINTENANCE: Social History  Substance Use Topics  . Smoking status: Never Smoker   . Smokeless tobacco: Never Used  . Alcohol Use: No     No Known  Allergies  Current Outpatient Prescriptions  Medication Sig Dispense Refill  . aspirin EC 81 MG tablet Take 81 mg by mouth daily.    Marland Kitchen b complex vitamins tablet Take 1 tablet by mouth daily.    . calcium citrate-vitamin D (CITRACAL+D) 315-200 MG-UNIT per tablet Take 1 tablet by mouth 2 (two) times daily.    Marland Kitchen glucosamine-chondroitin 500-400 MG tablet Take 1 tablet by mouth 2 (two) times daily.    Marland Kitchen lidocaine-prilocaine (EMLA) cream Apply 1 application topically as needed. 30 g 3  . loratadine (CLARITIN) 10 MG tablet Take 10 mg by mouth daily as needed.     Marland Kitchen losartan-hydrochlorothiazide (HYZAAR) 50-12.5 MG tablet Take 1 tablet by mouth daily. 90 tablet 3  . lovastatin (MEVACOR) 20 MG tablet Take 1 tablet (20 mg total) by mouth at bedtime. (Patient taking differently: Take 10 mg by mouth. Take half of 76m tablet daily) 90 tablet 3  . Multiple Vitamin (MULTI-VITAMINS) TABS Take by mouth.    . pregabalin (LYRICA) 75 MG capsule Take 1 capsule (75 mg total) by mouth at bedtime. 30 capsule 6   No current facility-administered medications for this visit.   Facility-Administered Medications Ordered in Other Visits  Medication Dose Route Frequency Provider Last Rate Last Dose  . sodium chloride flush (NS) 0.9 % injection 10 mL  10 mL Intravenous PRN JForest Gleason MD   10 mL at 05/31/15 1040  . sodium chloride flush (NS) 0.9 % injection 10 mL  10 mL Intravenous PRN JForest Gleason MD   10 mL at 07/12/15 1018    OBJECTIVE: BP 148/75 mmHg  Pulse 64  Temp(Src) 97 F (36.1 C) (Tympanic)  Ht _0  (1.626 m)  Wt 168 lb 6.4 oz (76.386 kg)  BMI 28.89 kg/m2   Body mass index is 28.89 kg/(m^2).    ECOG FS:0 - Asymptomatic  General: Well-developed, well-nourished, no acute distress. Eyes: Pink conjunctiva, anicteric sclera. HEENT: Normocephalic, moist mucous membranes, clear oropharnyx. Lungs: Clear to auscultation bilaterally. Heart: Regular rate and rhythm. No rubs, murmurs, or gallops. Abdomen:  Soft, nontender, nondistended. No organomegaly noted, normoactive bowel sounds. Colostomy in place and functioning. Musculoskeletal: No edema, cyanosis, or clubbing. Neuro: Alert, answering all questions appropriately. Cranial nerves grossly intact. Skin: No rashes or petechiae noted. Psych: Normal affect. Lymphatics: No cervical, clavicular, or axillary LAD.   LAB RESULTS:  Clinical Support on 07/12/2015  Component Date Value Ref Range Status  . WBC 07/12/2015 6.7  3.6 - 11.0 K/uL Final  . RBC 07/12/2015 3.60* 3.80 - 5.20 MIL/uL Final  . Hemoglobin 07/12/2015 12.6  12.0 - 16.0 g/dL Final  . HCT 07/12/2015 36.1  35.0 - 47.0 % Final  . MCV 07/12/2015 100.3* 80.0 - 100.0 fL Final  . MCH 07/12/2015 35.0* 26.0 - 34.0 pg Final  . MCHC 07/12/2015 34.9  32.0 - 36.0 g/dL Final  . RDW 07/12/2015 13.3  11.5 - 14.5 % Final  . Platelets 07/12/2015 252  150 - 440 K/uL Final  . Neutrophils Relative % 07/12/2015 74   Final  . Neutro Abs 07/12/2015 5.0  1.4 - 6.5 K/uL Final  . Lymphocytes Relative 07/12/2015 14  Final  . Lymphs Abs 07/12/2015 0.9* 1.0 - 3.6 K/uL Final  . Monocytes Relative 07/12/2015 10   Final  . Monocytes Absolute 07/12/2015 0.7  0.2 - 0.9 K/uL Final  . Eosinophils Relative 07/12/2015 1   Final  . Eosinophils Absolute 07/12/2015 0.1  0 - 0.7 K/uL Final  . Basophils Relative 07/12/2015 1   Final  . Basophils Absolute 07/12/2015 0.1  0 - 0.1 K/uL Final  . Sodium 07/12/2015 140  135 - 145 mmol/L Final  . Potassium 07/12/2015 3.5  3.5 - 5.1 mmol/L Final  . Chloride 07/12/2015 106  101 - 111 mmol/L Final  . CO2 07/12/2015 28  22 - 32 mmol/L Final  . Glucose, Bld 07/12/2015 139* 65 - 99 mg/dL Final  . BUN 07/12/2015 25* 6 - 20 mg/dL Final  . Creatinine, Ser 07/12/2015 0.97  0.44 - 1.00 mg/dL Final  . Calcium 07/12/2015 8.4* 8.9 - 10.3 mg/dL Final  . Total Protein 07/12/2015 6.6  6.5 - 8.1 g/dL Final  . Albumin 07/12/2015 3.8  3.5 - 5.0 g/dL Final  . AST 07/12/2015 25  15 - 41  U/L Final  . ALT 07/12/2015 23  14 - 54 U/L Final  . Alkaline Phosphatase 07/12/2015 64  38 - 126 U/L Final  . Total Bilirubin 07/12/2015 0.8  0.3 - 1.2 mg/dL Final  . GFR calc non Af Amer 07/12/2015 55* >60 mL/min Final  . GFR calc Af Amer 07/12/2015 >60  >60 mL/min Final   Comment: (NOTE) The eGFR has been calculated using the CKD EPI equation. This calculation has not been validated in all clinical situations. eGFR's persistently <60 mL/min signify possible Chronic Kidney Disease.   . Anion gap 07/12/2015 6  5 - 15 Final    STUDIES: No results found.  ASSESSMENT:  Adenocarcinoma of the rectum, stage III, T3 N1 M0.  PLAN:   1. Rectal cancer. Clinically there is no evidence of recurrent or progressive disease. Patient's most recent colonoscopy was in February 2017 and reported as normal, her next colonoscopy will be due in 5 years. Discussed with patient having her Port-A-Cath removed and she is in agreement. Forms were giving to nursing to fax to Dr. Franki Monte office. Patient continues to have lymphedema in the left lower extremity that requires compression stockings. We will refer patient to the lymphedema clinic for further instruction and therapy. 2. Peripheral neuropathy. Related to use of chemotherapy agents. Patient is currently on Lyrica 1 capsule per day at bedtime but reports that she feels very groggy and out of sorts when she takes it. She has recently cut back from 2 capsules per day to 1. We'll continue with tapering her off this dose. Advised patient to take a capsule every other day at bedtime for 2-3 weeks and depending completely stop Lyrica.  Patient will continue with routine follow-up in approximately 6 months.  Patient expressed understanding and was in agreement with this plan. She also understands that She can call clinic at any time with any questions, concerns, or complaints.   Dr. Grayland Ormond was available for consultation and review of plan of care for  this patient.  Evlyn Kanner, NP   07/12/2015 11:56 AM

## 2015-07-12 NOTE — Progress Notes (Signed)
Patient here for follow up. No complaints of pain or discomfort. Patient would like to discuss Port removal and discontinuing Lyrica.

## 2015-07-13 LAB — CEA: CEA: 2.3 ng/mL (ref 0.0–4.7)

## 2015-07-22 ENCOUNTER — Encounter: Payer: Self-pay | Admitting: Occupational Therapy

## 2015-07-22 ENCOUNTER — Ambulatory Visit: Payer: Medicare Other | Attending: Family Medicine | Admitting: Occupational Therapy

## 2015-07-22 DIAGNOSIS — I890 Lymphedema, not elsewhere classified: Secondary | ICD-10-CM | POA: Diagnosis not present

## 2015-07-22 NOTE — Therapy (Signed)
[place] MAIN [facility] SERVICES [address] [place], [place], [address] Phone: [phone]   Fax:  [phone]  Occupational Therapy Evaluation  Patient Details  Name: Christina Mathis MRN: [id number] Date of Birth: Nov 16, 1935 No Data Recorded  Encounter Date: 07/22/2015      OT End of Session - 07/22/15 1452    Visit Number 1   Number of Visits 36   Date for OT Re-Evaluation 10/20/15   OT Start Time 1006   OT Stop Time 1115   OT Time Calculation (min) 69 min   Activity Tolerance Patient tolerated treatment well;No increased pain   Behavior During Therapy [facility] for tasks assessed/performed      Past Medical History  Diagnosis Date  . Hypertension   . Hypercholesteremia   . Colon cancer ([place])   . History of shingles     twice  . Urine incontinence 03/06/2013    h/o  . Nephrolithiasis   . Neuropathy ([place])   . Left carotid bruit     Past Surgical History  Procedure Laterality Date  . Cesarean section    . Appendectomy  1979    colorectal  . Ventral hernia repair Right [id number]  . Surgery for colorectal cancer    . Colostomy    . Colonoscopy with propofol N/A 03/21/2015    Procedure: COLONOSCOPY WITH PROPOFOL;  Surgeon: Hulen Luster, MD;  Location: [facility] ENDOSCOPY;  Service: Gastroenterology;  Laterality: N/A;    There were no vitals filed for this visit.      Subjective Assessment - 07/22/15 1442    Subjective  Pt is referred by Evlyn Kanner, NP, for evaluation and treatment of LLE lymphedema. Pt reports onset shortly after completing treatment for colon cancer, including surgery and LN disection in 2015.  Pt  reports 3 falls in the last year  landing on the left affected side each time. Pt denies previous LE treatment and she doees not currently wear compression stockings.   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 S/p surgery  and colonostomy 03/2013. 01/2014 bilateral hernia repairs. Post chemo neuropathy B feet. Hx frequent falls last yearnever smoker.   Limitations decreased sensation B feet, falls risk, LLE pain and swelling, decreased standing tolerance and balance, difficulty walking   Patient Stated Goals decrease swelling and keep it from getting worse so it doesnt affect my doing things   Currently in Pain? Yes   Pain Score --  4   Pain Location Leg   Pain Orientation Left   Pain Descriptors / Indicators Tender;Pressure;Heaviness;Tightness;Tingling;Cramping;Numbness;Discomfort;Pins and needles   Pain Type Neuropathic pain;Chronic pain   Pain Onset More than a month ago   Pain Frequency Intermittent   Aggravating Factors  standing, walking, hot weather,sitting   Pain Relieving Factors elevation   Effect of Pain on Daily Activities limits ambulation and functional mobility, limits standing tolerance needed for basic and instrumental ADLs, limits ability to fit lower body clothing and street shoes, negatively impacts body image   Multiple Pain Sites Yes                            OT Education - 07/22/15 1452    Education provided Yes   Education Details Provided Pt/caregiver skilled education and ADL training throughout visit for lymphedema etiology, progression, and treatment including Intensive and Management Phase Complete Decongestive Therapy (  CDT)  Discussed lymphedema precautions, cellulitis risk, and all CDT and LE self-care components, including compression wrapping/ garments & devices, lymphatic pumping ther ex, simple self-MLD, and skin care. Provided printed Lymphedema Workbook for reference.   Person(s) Educated Patient   Methods Explanation;Demonstration;Handout   Comprehension Verbalized understanding;Need further instruction             OT Long Term Goals - 07/22/15 1508    OT LONG TERM GOAL #1   Title Lymphedema (LE) self-care: Pt able to apply multi layered,  gradient compression wraps independently using proper techniques within 2 weeks to achieve optimal limb volume reduction.   Baseline dependent   Time 2   Period Weeks   Status New   OT LONG TERM GOAL #2   Title Lymphedema (LE) self-care:  Pt to achieve at least 10% LLE limb volume reductions during Intensive CDT to limit LE progression, decrease infection risk, to reduce pain/ discomfort, and to improve safe ambulation and functional mobility.   Baseline dependent   Time 12   Period Weeks   Status New   OT LONG TERM GOAL #3   Title Lymphedema (LE) self-care:  Pt >/= 85 % compliant with all daily, LE self-care protocols for home program, including simple self-manual lymphatic drainage (MLD), skin care, lymphatic pumping the ex, skin care, and donning/ doffing compression wraps and garments o limit LE progression and further functional decline.     Baseline dependent   Time 12   Period Weeks   Status New   OT LONG TERM GOAL #4   Title Lymphedema (LE) self-care:  Pt able to perform all lymphedema self-care protocols independently within 12 weeks to limit progression and to decrease associated pain to improve functional performance in all domains.   Baseline dependent   Time 12   Period Weeks   Status New   OT LONG TERM GOAL #5   Title Lymphedema (LE) self-care:  During Management Phase CDT Pt to sustain limb volume reductions achieved during Intensive Phase CDT within 5% utilizing LE self-care protocols, appropriate compression garments/ devices, and needed level of caregiver assistance.   Baseline dependent   Time 6   Period Months   Status New               Plan - 07/22/15 1505    Clinical Impression Statement Pt presents with mild, stage II, Left lower extremity lymphedema (LE) with onset reported shortly after completing cancer treatment, including surgery (resection w/ 17 lymph nodes dissected), and chemo-radiation. Pt reports leg swelling and discomfort fluctuates, but  never resolves. Contributing factors include multiple falls this year with injuries mainly on left side of body.  Leg swelling is is concentrated primarily below the knees with dense, minimally pitting edema. LLE swelling and associated pain limit Pt's ability to perform basic and instrumental ADLs, to complete home management tasks, to perform work, productive, and leisure activities, and limits her ability to participate in social and community activities. Emphasis of OT for CDT will be on decreasing swelling and pain, and improving functional performance. Skilled Pt education will focus on LE self-care for optimal management over time. Pt will learn all LE self-care protocols, including lymphatic pumping exercises, simple self-manual lymphatic drainage (MLD), proper skin care techniques, and compression wrapping. Pt will learn about compression garments and devices for management over time, and will learn to use assistive devices to don and doff them with least physical effort.. Without skilled Occupational Therapy for Intensive and Management Phase Complete Decongestive  Therapy (CDT) to address chronic, progressive LLE LE, this condition will worsen and further functional decline is expected.    Rehab Potential Good   OT Frequency 3x / week   OT Duration 12 weeks   OT Treatment/Interventions Self-care/ADL training;DME and/or AE instruction;Manual lymph drainage;Patient/family education;Compression bandaging;Therapeutic exercises;Therapeutic activities;Manual Therapy   Plan fit with LLE custom, ccl 3 Elvarex knee high. Consider thigh high after completing measurements. Fit w/ convoluted foam compression boot to limit fibrosis formation and worsening during HODS. Consider trial w/ sequential pneumatic compression device (Flexitouch) PRN for optimal self management over time.   Consulted and Agree with Plan of Care Patient      Patient will benefit from skilled therapeutic intervention in order to improve  the following deficits and impairments:  Decreased knowledge of use of DME, Decreased skin integrity, Increased edema, Impaired flexibility, Pain, Decreased mobility, Impaired sensation, Decreased activity tolerance, Decreased range of motion, Decreased balance, Decreased knowledge of precautions, Difficulty walking  Visit Diagnosis: Lymphedema, not elsewhere classified - Plan: Ot plan of care cert/re-cert      G-Codes - 0000000 [id number]    Functional Assessment Tool Used Clinical observation and examination, medical records review, Pt and caregiver interview, comparative limb volumetrics   Functional Limitation Self care   Self Care Current Status [id number]) At least 60 percent but less than 80 percent impaired, limited or restricted   Self Care Goal Status [id number]) At least 20 percent but less than 40 percent impaired, limited or restricted      Problem List Patient Active Problem List   Diagnosis Date Noted  . Blood glucose elevated 07/12/2015  . Carotid artery disease ([place]) 07/06/2015  . Subclavian arterial stenosis ([place]) 06/14/2015  . Left shoulder pain 02/14/2015  . Left knee pain 02/14/2015  . Gonalgia 02/14/2015  . Pain in shoulder 02/14/2015  . Cancer of rectum ([place]) 01/11/2015  . Malignant neoplasm of rectum ([place]) 01/11/2015  . Health care maintenance 06/13/2014  . Encounter for general adult medical examination without abnormal findings 06/13/2014  . Neuropathy ([place]) 03/07/2014  . Urinary incontinence 03/07/2014  . Left carotid bruit 03/07/2014  . Nephrolithiasis 03/07/2014  . Calculus of kidney 03/07/2014  . Other specified symptoms and signs involving the circulatory and respiratory systems 03/07/2014  . Polyneuropathy ([place]) 03/07/2014  . Hypercholesterolemia 03/04/2014  . Essential hypertension 03/04/2014  . Colon cancer ([place]) 03/04/2014  . Essential (primary) hypertension 03/04/2014  . Malignant neoplasm of colon ([place]) 03/04/2014  . Pure hypercholesterolemia  03/04/2014  . Type 2 diabetes mellitus ([place]) 07/14/2013   Andrey Spearman, MS, OTR/L, [facility] 07/22/2015 3:15 PM  [place] MAIN [facility] SERVICES [address] [place], [place], [address] Phone: [phone]   Fax:  [phone]  Name: Floraine Raybould MRN: [id number] Date of Birth: [birth year]

## 2015-07-22 NOTE — Patient Instructions (Signed)

## 2015-07-25 DIAGNOSIS — Z933 Colostomy status: Secondary | ICD-10-CM | POA: Diagnosis not present

## 2015-07-25 DIAGNOSIS — Z85038 Personal history of other malignant neoplasm of large intestine: Secondary | ICD-10-CM | POA: Diagnosis not present

## 2015-07-27 ENCOUNTER — Ambulatory Visit: Payer: Medicare Other | Admitting: Occupational Therapy

## 2015-07-27 ENCOUNTER — Other Ambulatory Visit: Payer: Self-pay | Admitting: *Deleted

## 2015-07-27 DIAGNOSIS — I10 Essential (primary) hypertension: Secondary | ICD-10-CM

## 2015-07-27 MED ORDER — LOSARTAN POTASSIUM-HCTZ 50-12.5 MG PO TABS: 1.0000 | ORAL_TABLET | Freq: Every day | ORAL | Status: DC

## 2015-07-29 ENCOUNTER — Ambulatory Visit: Payer: Medicare Other | Admitting: Occupational Therapy

## 2015-07-29 DIAGNOSIS — I890 Lymphedema, not elsewhere classified: Secondary | ICD-10-CM | POA: Diagnosis not present

## 2015-07-29 NOTE — Patient Instructions (Signed)

## 2015-07-29 NOTE — Therapy (Signed)
[place] MAIN [facility] SERVICES [address] [place], [place], [address] Phone: [phone]   Fax:  [phone]  Occupational Therapy Treatment  Patient Details  Name: Christina Mathis MRN: [id number] Date of Birth: February 02, 1935 No Data Recorded  Encounter Date: 07/29/2015      OT End of Session - 07/29/15 1545    Visit Number 2   Number of Visits 36   Date for OT Re-Evaluation 10/20/15   OT Start Time 0209   OT Stop Time 0315   OT Time Calculation (min) 66 min   Activity Tolerance Patient tolerated treatment well;No increased pain   Behavior During Therapy [facility] for tasks assessed/performed      Past Medical History  Diagnosis Date  . Hypertension   . Hypercholesteremia   . Colon cancer ([place])   . History of shingles     twice  . Urine incontinence 03/06/2013    h/o  . Nephrolithiasis   . Neuropathy ([place])   . Left carotid bruit     Past Surgical History  Procedure Laterality Date  . Cesarean section    . Appendectomy  1979    colorectal  . Ventral hernia repair Right [id number]  . Surgery for colorectal cancer    . Colostomy    . Colonoscopy with propofol N/A 03/21/2015    Procedure: COLONOSCOPY WITH PROPOFOL;  Surgeon: Hulen Luster, MD;  Location: [facility] ENDOSCOPY;  Service: Gastroenterology;  Laterality: N/A;    There were no vitals filed for this visit.      Subjective Assessment - 07/29/15 1418    Subjective  Pt here for OT treatment visit 2 to address LLE LE . Pt has no new complaints since last evaluation.   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 S/p surgery and colonostomy 03/2013. 01/2014 bilateral hernia repairs. Post chemo neuropathy B feet. Hx frequent falls last yearnever smoker.   Limitations decreased sensation B feet, falls risk, LLE pain and swelling, decreased standing tolerance and balance, difficulty walking   Patient Stated Goals decrease swelling and keep it from getting worse so it doesnt affect my doing things   Currently in Pain? No/denies   Pain Onset More than a month ago             LYMPHEDEMA/ONCOLOGY QUESTIONNAIRE - 07/29/15 1536    Lymphedema Assessments   Lymphedema Assessments Lower extremities   Right Lower Extremity Lymphedema   Other RLE Limb volume from Ankle to below knee (A-D) = 2985.37 ml.    Other RLE Ankle to groin (A_G) = 10,080.59 ml   Left Lower Extremity Lymphedema   Other LLE A-D= 3327.05 ml   Other LLE A-G = 10,090 ml   Other Limb volume differential (LVD) at knee = 10.27%, L ( Rx) . R ( dominant). LVD at A-G = .09%, L>R, which is WNL                 OT Treatments/Exercises (OP) - 07/29/15 0001    ADLs   ADL Education Given Yes   Manual Therapy   Manual Therapy Edema management;Compression Bandaging   Manual therapy comments BLE comparative limb volumetrics   Compression Bandaging LLE gradient compression wraps applied from ankle to below knee: no toe wrap today as Pt did not bring post op shoe. ( toe wrap x1 under cotton stockinett;) 8 cm x 5 m x 1 to foot and ankle,  10 cm  x 5 m x 1 1 circumferentially in custommary layered gradient configuration  over .04 x 10 cm x 5 m Rosidol Soft x 1 roll                OT Education - 07/29/15 1542    Education provided Yes   Education Details Emphasis of Pt edu for LE self care focused on explaining limb volumetrics comparisons outcomes, introductory compression to below knee, and edu for lymphatic pumping ther ex. By end of session Pt able to perform all using proper techniques.   Person(s) Educated Patient   Methods Demonstration;Explanation;Tactile cues;Verbal cues;Handout   Comprehension Verbalized understanding;Returned demonstration;Verbal cues required;Tactile cues required;Need further instruction             OT Long Term Goals - 07/22/15 1508    OT LONG TERM GOAL #1   Title  Lymphedema (LE) self-care: Pt able to apply multi layered, gradient compression wraps independently using proper techniques within 2 weeks to achieve optimal limb volume reduction.   Baseline dependent   Time 2   Period Weeks   Status New   OT LONG TERM GOAL #2   Title Lymphedema (LE) self-care:  Pt to achieve at least 10% LLE limb volume reductions during Intensive CDT to limit LE progression, decrease infection risk, to reduce pain/ discomfort, and to improve safe ambulation and functional mobility.   Baseline dependent   Time 12   Period Weeks   Status New   OT LONG TERM GOAL #3   Title Lymphedema (LE) self-care:  Pt >/= 85 % compliant with all daily, LE self-care protocols for home program, including simple self-manual lymphatic drainage (MLD), skin care, lymphatic pumping the ex, skin care, and donning/ doffing compression wraps and garments o limit LE progression and further functional decline.     Baseline dependent   Time 12   Period Weeks   Status New   OT LONG TERM GOAL #4   Title Lymphedema (LE) self-care:  Pt able to perform all lymphedema self-care protocols independently within 12 weeks to limit progression and to decrease associated pain to improve functional performance in all domains.   Baseline dependent   Time 12   Period Weeks   Status New   OT LONG TERM GOAL #5   Title Lymphedema (LE) self-care:  During Management Phase CDT Pt to sustain limb volume reductions achieved during Intensive Phase CDT within 5% utilizing LE self-care protocols, appropriate compression garments/ devices, and needed level of caregiver assistance.   Baseline dependent   Time 6   Period Months   Status New               Plan - 07/29/15 1545    Clinical Impression Statement Comparative limb volumetrics reveal that LLE is greater in volume than dominant R leg below the knee only by 10.27%. R and LLEs are equal from ankle to groin and overall WNL. Pt able to perform lymphatic pumping  ther ex w/ moderate assistance by end of session. Pt tolerated compression wraps modified  without foot wrap as she did not have post op shoe today. Cont as per POC.   Rehab Potential Good   OT Frequency 3x / week   OT Duration 12 weeks   OT Treatment/Interventions Self-care/ADL training;DME and/or AE instruction;Manual lymph drainage;Patient/family education;Compression bandaging;Therapeutic exercises;Therapeutic activities;Manual Therapy   Consulted and Agree with Plan of Care Patient      Patient will benefit from skilled therapeutic intervention in order  to improve the following deficits and impairments:  Decreased knowledge of use of DME, Decreased skin integrity, Increased edema, Impaired flexibility, Pain, Decreased mobility, Impaired sensation, Decreased activity tolerance, Decreased range of motion, Decreased balance, Decreased knowledge of precautions, Difficulty walking  Visit Diagnosis: Lymphedema, not elsewhere classified    Problem List Patient Active Problem List   Diagnosis Date Noted  . Blood glucose elevated 07/12/2015  . Carotid artery disease ([place]) 07/06/2015  . Subclavian arterial stenosis ([place]) 06/14/2015  . Left shoulder pain 02/14/2015  . Left knee pain 02/14/2015  . Gonalgia 02/14/2015  . Pain in shoulder 02/14/2015  . Cancer of rectum ([place]) 01/11/2015  . Malignant neoplasm of rectum ([place]) 01/11/2015  . Health care maintenance 06/13/2014  . Encounter for general adult medical examination without abnormal findings 06/13/2014  . Neuropathy ([place]) 03/07/2014  . Urinary incontinence 03/07/2014  . Left carotid bruit 03/07/2014  . Nephrolithiasis 03/07/2014  . Calculus of kidney 03/07/2014  . Other specified symptoms and signs involving the circulatory and respiratory systems 03/07/2014  . Polyneuropathy ([place]) 03/07/2014  . Hypercholesterolemia 03/04/2014  . Essential hypertension 03/04/2014  . Colon cancer ([place]) 03/04/2014  . Essential (primary) hypertension  03/04/2014  . Malignant neoplasm of colon ([place]) 03/04/2014  . Pure hypercholesterolemia 03/04/2014  . Type 2 diabetes mellitus ([place]) 07/14/2013   Andrey Spearman, MS, OTR/L, [facility] 07/29/2015 3:49 PM  [place] MAIN [facility] SERVICES [address] [place], [place], [address] Phone: [phone]   Fax:  [phone]  Name: Christina Mathis MRN: [id number] Date of Birth: 1935-12-29

## 2015-08-01 ENCOUNTER — Ambulatory Visit: Payer: Medicare Other | Attending: Family Medicine | Admitting: Occupational Therapy

## 2015-08-01 DIAGNOSIS — I890 Lymphedema, not elsewhere classified: Secondary | ICD-10-CM

## 2015-08-01 NOTE — Therapy (Signed)
[place] MAIN [facility] SERVICES [address] [place], [place], [address] Phone: [phone]   Fax:  [phone]  Occupational Therapy Treatment  Patient Details  Name: Christina Mathis MRN: [id number] Date of Birth: [birth year] No Data Recorded  Encounter Date: 08/01/2015      OT End of Session - 08/01/15 1354    Visit Number 2   Number of Visits 36   Date for OT Re-Evaluation 10/20/15   OT Start Time 0105   OT Stop Time 0154   OT Time Calculation (min) 49 min   Activity Tolerance Patient tolerated treatment well;No increased pain   Behavior During Therapy [facility] for tasks assessed/performed      Past Medical History  Diagnosis Date  . Hypertension   . Hypercholesteremia   . Colon cancer ([place])   . History of shingles     twice  . Urine incontinence 03/06/2013    h/o  . Nephrolithiasis   . Neuropathy ([place])   . Left carotid bruit     Past Surgical History  Procedure Laterality Date  . Cesarean section    . Appendectomy  1979    colorectal  . Ventral hernia repair Right [id number]  . Surgery for colorectal cancer    . Colostomy    . Colonoscopy with propofol N/A 03/21/2015    Procedure: COLONOSCOPY WITH PROPOFOL;  Surgeon: Hulen Luster, MD;  Location: [facility] ENDOSCOPY;  Service: Gastroenterology;  Laterality: N/A;    There were no vitals filed for this visit.      Subjective Assessment - 08/01/15 1311    Subjective  Pt here for OT treatment visit 2 to address LLE LE . Pt has no new complaints since last evaluation.   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 S/p surgery and colonostomy 03/2013. 01/2014 bilateral hernia repairs. Post chemo neuropathy B feet. Hx frequent falls last yearnever smoker.   Limitations decreased sensation B feet, falls risk, LLE pain and swelling, decreased standing tolerance and balance, difficulty walking   Patient  Stated Goals decrease swelling and keep it from getting worse so it doesnt affect my doing things   Currently in Pain? No/denies   Pain Onset More than a month ago                      OT Treatments/Exercises (OP) - 08/01/15 0001    ADLs   ADL Education Given Yes   Manual Therapy   Manual Therapy Edema management;Compression Bandaging   Compression Bandaging LLE gradient compression wraps applied from ankle to below knee: no toe wrap today as Pt did not bring post op shoe. ( toe wrap x1 under cotton stockinett;) 8 cm x 5 m x 1 to foot and ankle, 10 cm  x 5 m x 1 1 circumferentially in custommary layered gradient configuration  over .04 x 10 cm x 5 m Rosidol Soft x 1 roll                OT Education - 08/01/15 1343    Education provided Yes   Education Details Emphasis of LE ADL training today on patient edu for proper compression wraps application using  circumferential, gradient techniques and proper positioning.    Methods Explanation;Demonstration   Comprehension Verbalized understanding;Returned demonstration             OT Long Term Goals - 07/22/15 [id number]  OT LONG TERM GOAL #1   Title Lymphedema (LE) self-care: Pt able to apply multi layered, gradient compression wraps independently using proper techniques within 2 weeks to achieve optimal limb volume reduction.   Baseline dependent   Time 2   Period Weeks   Status New   OT LONG TERM GOAL #2   Title Lymphedema (LE) self-care:  Pt to achieve at least 10% LLE limb volume reductions during Intensive CDT to limit LE progression, decrease infection risk, to reduce pain/ discomfort, and to improve safe ambulation and functional mobility.   Baseline dependent   Time 12   Period Weeks   Status New   OT LONG TERM GOAL #3   Title Lymphedema (LE) self-care:  Pt >/= 85 % compliant with all daily, LE self-care protocols for home program, including simple self-manual lymphatic drainage (MLD), skin care,  lymphatic pumping the ex, skin care, and donning/ doffing compression wraps and garments o limit LE progression and further functional decline.     Baseline dependent   Time 12   Period Weeks   Status New   OT LONG TERM GOAL #4   Title Lymphedema (LE) self-care:  Pt able to perform all lymphedema self-care protocols independently within 12 weeks to limit progression and to decrease associated pain to improve functional performance in all domains.   Baseline dependent   Time 12   Period Weeks   Status New   OT LONG TERM GOAL #5   Title Lymphedema (LE) self-care:  During Management Phase CDT Pt to sustain limb volume reductions achieved during Intensive Phase CDT within 5% utilizing LE self-care protocols, appropriate compression garments/ devices, and needed level of caregiver assistance.   Baseline dependent   Time 6   Period Months   Status New               Plan - 08/01/15 1349    Clinical Impression Statement By end of session Pt able to apply 3 layer knee length compression wrap with min A, oincluding verbal and physical cues. Pt utilized excellent technique and I expect her to be able to appy wraps between visits with a little practice.    Rehab Potential Good   OT Frequency 3x / week   OT Duration 12 weeks   OT Treatment/Interventions Self-care/ADL training;DME and/or AE instruction;Manual lymph drainage;Patient/family education;Compression bandaging;Therapeutic exercises;Therapeutic activities;Manual Therapy   Consulted and Agree with Plan of Care Patient      Patient will benefit from skilled therapeutic intervention in order to improve the following deficits and impairments:  Decreased knowledge of use of DME, Decreased skin integrity, Increased edema, Impaired flexibility, Pain, Decreased mobility, Impaired sensation, Decreased activity tolerance, Decreased range of motion, Decreased balance, Decreased knowledge of precautions, Difficulty walking  Visit  Diagnosis: Lymphedema, not elsewhere classified    Problem List Patient Active Problem List   Diagnosis Date Noted  . Blood glucose elevated 07/12/2015  . Carotid artery disease ([place]) 07/06/2015  . Subclavian arterial stenosis ([place]) 06/14/2015  . Left shoulder pain 02/14/2015  . Left knee pain 02/14/2015  . Gonalgia 02/14/2015  . Pain in shoulder 02/14/2015  . Cancer of rectum ([place]) 01/11/2015  . Malignant neoplasm of rectum ([place]) 01/11/2015  . Health care maintenance 06/13/2014  . Encounter for general adult medical examination without abnormal findings 06/13/2014  . Neuropathy ([place]) 03/07/2014  . Urinary incontinence 03/07/2014  . Left carotid bruit 03/07/2014  . Nephrolithiasis 03/07/2014  . Calculus of kidney 03/07/2014  . Other specified symptoms and  signs involving the circulatory and respiratory systems 03/07/2014  . Polyneuropathy ([place]) 03/07/2014  . Hypercholesterolemia 03/04/2014  . Essential hypertension 03/04/2014  . Colon cancer ([place]) 03/04/2014  . Essential (primary) hypertension 03/04/2014  . Malignant neoplasm of colon ([place]) 03/04/2014  . Pure hypercholesterolemia 03/04/2014  . Type 2 diabetes mellitus ([place]) 07/14/2013    Andrey Spearman, MS, OTR/L, [facility] 08/01/2015 1:56 PM  [place] MAIN [facility] SERVICES [address] [place], [place], [address] Phone: [phone]   Fax:  [phone]  Name: Christina Mathis MRN: [id number] Date of Birth: 1935-03-31

## 2015-08-01 NOTE — Patient Instructions (Signed)
LE instructions and precautions as established- see initial eval.   

## 2015-08-03 ENCOUNTER — Ambulatory Visit: Payer: Medicare Other | Admitting: Occupational Therapy

## 2015-08-03 DIAGNOSIS — I890 Lymphedema, not elsewhere classified: Secondary | ICD-10-CM

## 2015-08-03 NOTE — Therapy (Signed)
[place] MAIN [facility] SERVICES [address] [place], [place], [address] Phone: [phone]   Fax:  [phone]  Occupational Therapy Treatment  Patient Details  Name: Christina Mathis MRN: [id number] Date of Birth: 1935-12-12 No Data Recorded  Encounter Date: 08/03/2015      OT End of Session - 08/03/15 1150    Visit Number 3   Number of Visits 36   Date for OT Re-Evaluation 10/20/15   OT Start Time 1006   OT Stop Time 1109   OT Time Calculation (min) 63 min      Past Medical History  Diagnosis Date  . Hypertension   . Hypercholesteremia   . Colon cancer ([place])   . History of shingles     twice  . Urine incontinence 03/06/2013    h/o  . Nephrolithiasis   . Neuropathy ([place])   . Left carotid bruit     Past Surgical History  Procedure Laterality Date  . Cesarean section    . Appendectomy  1979    colorectal  . Ventral hernia repair Right [id number]  . Surgery for colorectal cancer    . Colostomy    . Colonoscopy with propofol N/A 03/21/2015    Procedure: COLONOSCOPY WITH PROPOFOL;  Surgeon: Hulen Luster, MD;  Location: [facility] ENDOSCOPY;  Service: Gastroenterology;  Laterality: N/A;    There were no vitals filed for this visit.      Subjective Assessment - 08/03/15 1130    Subjective  Pt presents for OT treatment visit 3 to address LLE LE . Pt reports she tolerated compression wraps applied last session for > 24 hours without difficulty. Pt tells me that she did not attempt to reapply compression wraps. Pt tells me she did practive lymphatic pumping ther ex 1x during interval.   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 S/p surgery and colonostomy 03/2013. 01/2014 bilateral hernia repairs. Post chemo neuropathy B feet. Hx frequent falls last yearnever smoker.   Limitations decreased sensation B feet, falls risk, LLE pain and swelling,  decreased standing tolerance and balance, difficulty walking   Patient Stated Goals decrease swelling and keep it from getting worse so it doesnt affect my doing things   Currently in Pain? Yes  LLE, L shoulder: not numerically rated   Pain Onset More than a month ago                      OT Treatments/Exercises (OP) - 08/03/15 0001    Manual Therapy   Manual Therapy Edema management;Manual Lymphatic Drainage (MLD);Compression Bandaging;Other (comment)  skin care to LLE w/ low [place] during MLD   Manual Lymphatic Drainage (MLD) LLE/LLQ manual lymph drainage (MLD) in supine and R side lying utilizing, in addition to groin and any in tact deep abdominal lymphatics, the  ipsilateral axillo-inguinal anastamosis and deep abdominal lymphatics.  No deep strokes to abdomen due to ostomy. Sequence included stationary J strokes to complete bilateral "short neck" sequence, patient directed diaphragmatic breathing, stationary strokes to L axilla, and then dynamic strokes along axillo-inguinal pathway from proximal to distal across transverse watershed. Dynamic strokes to the thigh, knee, leg and foot from proximal to distal, and then reverse along pathways back to terminus. No fibrosis techniques today.   Compression Bandaging LLE gradient compression wraps applied from ankle to below knee: no toe wrap today as Pt did not bring  post op shoe. ( toe wrap x1 under cotton stockinett;) 8 cm x 5 m x 1 to foot and ankle, 10 cm  x 5 m x 1 1 circumferentially in custommary layered gradient configuration  over .04 x 10 cm x 5 m Rosidol Soft x 1 roll                OT Education - 08/03/15 1148    Education provided Yes   Education Details skilled ADL training for LE self care, including gradient compression wrapping and simple self MLD   Person(s) Educated Patient   Methods Explanation;Demonstration   Comprehension Verbalized understanding;Need further instruction              OT Long Term Goals - 07/22/15 1508    OT LONG TERM GOAL #1   Title Lymphedema (LE) self-care: Pt able to apply multi layered, gradient compression wraps independently using proper techniques within 2 weeks to achieve optimal limb volume reduction.   Baseline dependent   Time 2   Period Weeks   Status New   OT LONG TERM GOAL #2   Title Lymphedema (LE) self-care:  Pt to achieve at least 10% LLE limb volume reductions during Intensive CDT to limit LE progression, decrease infection risk, to reduce pain/ discomfort, and to improve safe ambulation and functional mobility.   Baseline dependent   Time 12   Period Weeks   Status New   OT LONG TERM GOAL #3   Title Lymphedema (LE) self-care:  Pt >/= 85 % compliant with all daily, LE self-care protocols for home program, including simple self-manual lymphatic drainage (MLD), skin care, lymphatic pumping the ex, skin care, and donning/ doffing compression wraps and garments o limit LE progression and further functional decline.     Baseline dependent   Time 12   Period Weeks   Status New   OT LONG TERM GOAL #4   Title Lymphedema (LE) self-care:  Pt able to perform all lymphedema self-care protocols independently within 12 weeks to limit progression and to decrease associated pain to improve functional performance in all domains.   Baseline dependent   Time 12   Period Weeks   Status New   OT LONG TERM GOAL #5   Title Lymphedema (LE) self-care:  During Management Phase CDT Pt to sustain limb volume reductions achieved during Intensive Phase CDT within 5% utilizing LE self-care protocols, appropriate compression garments/ devices, and needed level of caregiver assistance.   Baseline dependent   Time 6   Period Months   Status New               Plan - 08/03/15 1150    Clinical Impression Statement Pt had no difficulty tolerating compression for > 24 hours after last session. Mildly decreased o\LLE swelling below knee is visible today,  despite no compression for last 24 hrs. Pt compliant w/ ther ex, and in fact brinks dedicated notebook containing all handouts to date to clinic. Pt verbalized introductory training for self MLD and , altho not confident today, she is willing to practice rewrapping compression wraps after performing skin care regime tomorrow. Nice progress thus far. Cont as per POC.    Rehab Potential Good   OT Frequency 3x / week   OT Duration 12 weeks   OT Treatment/Interventions Self-care/ADL training;DME and/or AE instruction;Manual lymph drainage;Patient/family education;Compression bandaging;Therapeutic exercises;Therapeutic activities;Manual Therapy   Consulted and Agree with Plan of Care Patient      Patient will benefit from skilled  therapeutic intervention in order to improve the following deficits and impairments:  Decreased knowledge of use of DME, Decreased skin integrity, Increased edema, Impaired flexibility, Pain, Decreased mobility, Impaired sensation, Decreased activity tolerance, Decreased range of motion, Decreased balance, Decreased knowledge of precautions, Difficulty walking  Visit Diagnosis: Lymphedema, not elsewhere classified    Problem List Patient Active Problem List   Diagnosis Date Noted  . Blood glucose elevated 07/12/2015  . Carotid artery disease ([place]) 07/06/2015  . Subclavian arterial stenosis ([place]) 06/14/2015  . Left shoulder pain 02/14/2015  . Left knee pain 02/14/2015  . Gonalgia 02/14/2015  . Pain in shoulder 02/14/2015  . Cancer of rectum ([place]) 01/11/2015  . Malignant neoplasm of rectum ([place]) 01/11/2015  . Health care maintenance 06/13/2014  . Encounter for general adult medical examination without abnormal findings 06/13/2014  . Neuropathy ([place]) 03/07/2014  . Urinary incontinence 03/07/2014  . Left carotid bruit 03/07/2014  . Nephrolithiasis 03/07/2014  . Calculus of kidney 03/07/2014  . Other specified symptoms and signs involving the circulatory and  respiratory systems 03/07/2014  . Polyneuropathy ([place]) 03/07/2014  . Hypercholesterolemia 03/04/2014  . Essential hypertension 03/04/2014  . Colon cancer ([place]) 03/04/2014  . Essential (primary) hypertension 03/04/2014  . Malignant neoplasm of colon ([place]) 03/04/2014  . Pure hypercholesterolemia 03/04/2014  . Type 2 diabetes mellitus ([place]) 07/14/2013    Andrey Spearman, MS, OTR/L, [facility] 08/03/2015 11:56 AM   [place] MAIN [facility] SERVICES [address] [place], [place], [address] Phone: [phone]   Fax:  [phone]  Name: Timiyah Mirchandani MRN: [id number] Date of Birth: Mar 14, 1935

## 2015-08-03 NOTE — Patient Instructions (Signed)
LE instructions and precautions as established- see initial eval.   

## 2015-08-08 ENCOUNTER — Ambulatory Visit: Payer: Medicare Other | Admitting: Occupational Therapy

## 2015-08-08 DIAGNOSIS — I890 Lymphedema, not elsewhere classified: Secondary | ICD-10-CM | POA: Diagnosis not present

## 2015-08-08 NOTE — Therapy (Signed)
[place] MAIN [facility] SERVICES [address] [place], [place], [address] Phone: [phone]   Fax:  [phone]  Occupational Therapy Treatment  Patient Details  Name: Christina Mathis MRN: [id number] Date of Birth: 06-13-1935 No Data Recorded  Encounter Date: 08/08/2015      OT End of Session - 08/08/15 1422    Visit Number 4   Number of Visits 36   Date for OT Re-Evaluation 10/20/15   OT Start Time 0105   OT Stop Time 0222   OT Time Calculation (min) 77 min      Past Medical History  Diagnosis Date  . Hypertension   . Hypercholesteremia   . Colon cancer ([place])   . History of shingles     twice  . Urine incontinence 03/06/2013    h/o  . Nephrolithiasis   . Neuropathy ([place])   . Left carotid bruit     Past Surgical History  Procedure Laterality Date  . Cesarean section    . Appendectomy  1979    colorectal  . Ventral hernia repair Right [id number]  . Surgery for colorectal cancer    . Colostomy    . Colonoscopy with propofol N/A 03/21/2015    Procedure: COLONOSCOPY WITH PROPOFOL;  Surgeon: Hulen Luster, MD;  Location: [facility] ENDOSCOPY;  Service: Gastroenterology;  Laterality: N/A;    There were no vitals filed for this visit.      Subjective Assessment - 08/08/15 1409    Subjective  Pt presents for OT treatment visit 4 to address LLE LE . Pt reports she's been unable to manage compression wraps during visit intervals b/c of shoulder pain. Pt  gets sterroid injection in L shoulder tomorrow and expects enough releif to be able to participate in [place] Rx more.   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 S/p surgery and colonostomy 03/2013. 01/2014 bilateral hernia repairs. Post chemo neuropathy B feet. Hx frequent falls last yearnever smoker.   Limitations decreased sensation B feet, falls risk, LLE pain and swelling, decreased standing  tolerance and balance, difficulty walking   Patient Stated Goals decrease swelling and keep it from getting worse so it doesnt affect my doing things   Currently in Pain? Yes  no chnage since eval. Not rated numerically today   Pain Onset More than a month ago                      OT Treatments/Exercises (OP) - 08/08/15 0001    ADLs   ADL Education Given Yes   Manual Therapy   Manual Therapy Edema management;Manual Lymphatic Drainage (MLD);Compression Bandaging;Other (comment)   Manual Lymphatic Drainage (MLD) LLE/LLQ manual lymph drainage (MLD) in supine and R side lying utilizing, in addition to groin and any in tact deep abdominal lymphatics, the  ipsilateral axillo-inguinal anastamosis and deep abdominal lymphatics.  No deep strokes to abdomen due to ostomy. Sequence included stationary J strokes to complete bilateral "short neck" sequence, patient directed diaphragmatic breathing, stationary strokes to L axilla, and then dynamic strokes along axillo-inguinal pathway from proximal to distal across transverse watershed. Dynamic strokes to the thigh, knee, leg and foot from proximal to distal, and then reverse along pathways back to terminus. No fibrosis techniques today.   Compression Bandaging LLE gradient compression wraps applied from ankle to below knee: no toe wrap today as Pt did not bring post op shoe. (  toe wrap x1 under cotton stockinett;) 8 cm x 5 m x 1 to foot and ankle, 10 cm  x 5 m x 1 1 circumferentially in custommary layered gradient configuration  over .04 x 10 cm x 5 m Rosidol Soft x 1 roll                OT Education - 08/08/15 1420    Education provided Yes   Education Details Cont Pt edu for all LE self care protocols- emphasis today on lymphatic structures and function in relation to MLD and decreasing swelling and inflamation   Person(s) Educated Patient   Methods Explanation;Demonstration;Handout   Comprehension Verbalized understanding;Need  further instruction             OT Long Term Goals - 07/22/15 1508    OT LONG TERM GOAL #1   Title Lymphedema (LE) self-care: Pt able to apply multi layered, gradient compression wraps independently using proper techniques within 2 weeks to achieve optimal limb volume reduction.   Baseline dependent   Time 2   Period Weeks   Status New   OT LONG TERM GOAL #2   Title Lymphedema (LE) self-care:  Pt to achieve at least 10% LLE limb volume reductions during Intensive CDT to limit LE progression, decrease infection risk, to reduce pain/ discomfort, and to improve safe ambulation and functional mobility.   Baseline dependent   Time 12   Period Weeks   Status New   OT LONG TERM GOAL #3   Title Lymphedema (LE) self-care:  Pt >/= 85 % compliant with all daily, LE self-care protocols for home program, including simple self-manual lymphatic drainage (MLD), skin care, lymphatic pumping the ex, skin care, and donning/ doffing compression wraps and garments o limit LE progression and further functional decline.     Baseline dependent   Time 12   Period Weeks   Status New   OT LONG TERM GOAL #4   Title Lymphedema (LE) self-care:  Pt able to perform all lymphedema self-care protocols independently within 12 weeks to limit progression and to decrease associated pain to improve functional performance in all domains.   Baseline dependent   Time 12   Period Weeks   Status New   OT LONG TERM GOAL #5   Title Lymphedema (LE) self-care:  During Management Phase CDT Pt to sustain limb volume reductions achieved during Intensive Phase CDT within 5% utilizing LE self-care protocols, appropriate compression garments/ devices, and needed level of caregiver assistance.   Baseline dependent   Time 6   Period Months   Status New               Plan - 08/08/15 1423    Clinical Impression Statement No visible change in LLE swelling since last visit. L knee feels warm to touch. Suspect inflamation 2/2  arthritis is exacerbating limb swelling. Pt unable to wrap over the weekend 2/2 shoulder pain. She anticipates improved  compliance w/ home program after shoulder injection tomorrow.,   Rehab Potential Good   OT Frequency 3x / week   OT Duration 12 weeks   OT Treatment/Interventions Self-care/ADL training;DME and/or AE instruction;Manual lymph drainage;Patient/family education;Compression bandaging;Therapeutic exercises;Therapeutic activities;Manual Therapy   Consulted and Agree with Plan of Care Patient      Patient will benefit from skilled therapeutic intervention in order to improve the following deficits and impairments:  Decreased knowledge of use of DME, Decreased skin integrity, Increased edema, Impaired flexibility, Pain, Decreased mobility, Impaired sensation, Decreased activity  tolerance, Decreased range of motion, Decreased balance, Decreased knowledge of precautions, Difficulty walking  Visit Diagnosis: Lymphedema, not elsewhere classified    Problem List Patient Active Problem List   Diagnosis Date Noted  . Blood glucose elevated 07/12/2015  . Carotid artery disease ([place]) 07/06/2015  . Subclavian arterial stenosis ([place]) 06/14/2015  . Left shoulder pain 02/14/2015  . Left knee pain 02/14/2015  . Gonalgia 02/14/2015  . Pain in shoulder 02/14/2015  . Cancer of rectum ([place]) 01/11/2015  . Malignant neoplasm of rectum ([place]) 01/11/2015  . Health care maintenance 06/13/2014  . Encounter for general adult medical examination without abnormal findings 06/13/2014  . Neuropathy ([place]) 03/07/2014  . Urinary incontinence 03/07/2014  . Left carotid bruit 03/07/2014  . Nephrolithiasis 03/07/2014  . Calculus of kidney 03/07/2014  . Other specified symptoms and signs involving the circulatory and respiratory systems 03/07/2014  . Polyneuropathy ([place]) 03/07/2014  . Hypercholesterolemia 03/04/2014  . Essential hypertension 03/04/2014  . Colon cancer ([place]) 03/04/2014  . Essential  (primary) hypertension 03/04/2014  . Malignant neoplasm of colon ([place]) 03/04/2014  . Pure hypercholesterolemia 03/04/2014  . Type 2 diabetes mellitus ([place]) 07/14/2013   Andrey Spearman, MS, OTR/L, [facility] 08/08/2015 2:26 PM  [place] MAIN [facility] SERVICES [address] [place], [place], [address] Phone: [phone]   Fax:  [phone]  Name: Christina Mathis MRN: [id number] Date of Birth: [birth year]

## 2015-08-08 NOTE — Patient Instructions (Signed)
LE instructions and precautions as established- see initial eval.   

## 2015-08-09 DIAGNOSIS — M7552 Bursitis of left shoulder: Secondary | ICD-10-CM | POA: Diagnosis not present

## 2015-08-10 ENCOUNTER — Ambulatory Visit: Payer: Medicare Other | Admitting: Occupational Therapy

## 2015-08-10 DIAGNOSIS — I890 Lymphedema, not elsewhere classified: Secondary | ICD-10-CM

## 2015-08-10 NOTE — Therapy (Signed)
[place] MAIN [facility] SERVICES [address] [place], [place], [address] Phone: [phone]   Fax:  [phone]  Occupational Therapy Treatment  Patient Details  Name: Christina Mathis MRN: [id number] Date of Birth: 1935-11-03 No Data Recorded  Encounter Date: 08/10/2015      OT End of Session - 08/10/15 1419    Visit Number 5   Number of Visits 36   Date for OT Re-Evaluation 10/20/15   OT Start Time 0110   OT Stop Time 0215   OT Time Calculation (min) 65 min      Past Medical History  Diagnosis Date  . Hypertension   . Hypercholesteremia   . Colon cancer ([place])   . History of shingles     twice  . Urine incontinence 03/06/2013    h/o  . Nephrolithiasis   . Neuropathy ([place])   . Left carotid bruit     Past Surgical History  Procedure Laterality Date  . Cesarean section    . Appendectomy  1979    colorectal  . Ventral hernia repair Right [id number]  . Surgery for colorectal cancer    . Colostomy    . Colonoscopy with propofol N/A 03/21/2015    Procedure: COLONOSCOPY WITH PROPOFOL;  Surgeon: Hulen Luster, MD;  Location: [facility] ENDOSCOPY;  Service: Gastroenterology;  Laterality: N/A;    There were no vitals filed for this visit.                    OT Treatments/Exercises (OP) - 08/10/15 0001    ADLs   ADL Education Given Yes   Manual Therapy   Manual Therapy Edema management;Manual Lymphatic Drainage (MLD);Other (comment)  trial with loaned elastic compression garment vs wraps today   Manual Lymphatic Drainage (MLD) LLE/LLQ manual lymph drainage (MLD) in supine and R side lying utilizing, in addition to groin and any in tact deep abdominal lymphatics, the  ipsilateral axillo-inguinal anastamosis and deep abdominal lymphatics.  No deep strokes to abdomen due to ostomy. Sequence included stationary J strokes to complete bilateral "short neck" sequence, patient directed diaphragmatic breathing, stationary strokes to L  axilla, and then dynamic strokes along axillo-inguinal pathway from proximal to distal across transverse watershed. Dynamic strokes to the thigh, knee, leg and foot from proximal to distal, and then reverse along pathways back to terminus. No fibrosis techniques today.   Other Manual Therapy skin care                OT Education - 08/10/15 1417    Education provided Yes   Education Details Emphasis of skilled LE self-care training today on compression garment/ device rational, proper fit and function for lymphedema management over time, wear and care regimes, and assistive device options for donning and doffing w/ less physical effort.   Person(s) Educated Patient   Methods Explanation;Demonstration;Tactile cues;Verbal cues;Handout   Comprehension Verbalized understanding;Need further instruction             OT Long Term Goals - 07/22/15 1508    OT LONG TERM GOAL #1   Title Lymphedema (LE) self-care: Pt able to apply multi layered, gradient compression wraps independently using proper techniques within 2 weeks to achieve optimal limb volume reduction.   Baseline dependent   Time 2   Period Weeks   Status New   OT LONG TERM GOAL #2   Title Lymphedema (LE) self-care:  Pt to achieve at least 10% LLE limb volume reductions during Intensive CDT to  limit LE progression, decrease infection risk, to reduce pain/ discomfort, and to improve safe ambulation and functional mobility.   Baseline dependent   Time 12   Period Weeks   Status New   OT LONG TERM GOAL #3   Title Lymphedema (LE) self-care:  Pt >/= 85 % compliant with all daily, LE self-care protocols for home program, including simple self-manual lymphatic drainage (MLD), skin care, lymphatic pumping the ex, skin care, and donning/ doffing compression wraps and garments o limit LE progression and further functional decline.     Baseline dependent   Time 12   Period Weeks   Status New   OT LONG TERM GOAL #4   Title  Lymphedema (LE) self-care:  Pt able to perform all lymphedema self-care protocols independently within 12 weeks to limit progression and to decrease associated pain to improve functional performance in all domains.   Baseline dependent   Time 12   Period Weeks   Status New   OT LONG TERM GOAL #5   Title Lymphedema (LE) self-care:  During Management Phase CDT Pt to sustain limb volume reductions achieved during Intensive Phase CDT within 5% utilizing LE self-care protocols, appropriate compression garments/ devices, and needed level of caregiver assistance.   Baseline dependent   Time 6   Period Months   Status New               Plan - 08/10/15 1423    Clinical Impression Statement Pt attempted self wrapping for first time since commencing CDT last night. She losely followed model for wrapping taught in previous session. but despite some errors, swelling appears well contained this afternoon and visibly reduced. Majority of session was dedicated to skilled education for compression garment rationall for long term management of chronic, progressive LE. Pt will  trial  loaned, knee length,  Juzo Dynamic 3512 (  ccl 2- 30-40 mmHg) w/  open toe, on LLE for one day Rx interval.  If containment appears sufficient  and Pt reports comfort in garments, we will expedite CDT and order garments and DC wraps ASAP to limit burden of care. Pt's leg swelling is responding very well to date and she is ready for compression garments vs wraps.   Rehab Potential Good   OT Frequency 3x / week   OT Duration 12 weeks   OT Treatment/Interventions Self-care/ADL training;DME and/or AE instruction;Manual lymph drainage;Patient/family education;Compression bandaging;Therapeutic exercises;Therapeutic activities;Manual Therapy   Consulted and Agree with Plan of Care Patient      Patient will benefit from skilled therapeutic intervention in order to improve the following deficits and impairments:  Decreased knowledge  of use of DME, Decreased skin integrity, Increased edema, Impaired flexibility, Pain, Decreased mobility, Impaired sensation, Decreased activity tolerance, Decreased range of motion, Decreased balance, Decreased knowledge of precautions, Difficulty walking  Visit Diagnosis: Lymphedema, not elsewhere classified    Problem List Patient Active Problem List   Diagnosis Date Noted  . Blood glucose elevated 07/12/2015  . Carotid artery disease ([place]) 07/06/2015  . Subclavian arterial stenosis ([place]) 06/14/2015  . Left shoulder pain 02/14/2015  . Left knee pain 02/14/2015  . Gonalgia 02/14/2015  . Pain in shoulder 02/14/2015  . Cancer of rectum ([place]) 01/11/2015  . Malignant neoplasm of rectum ([place]) 01/11/2015  . Health care maintenance 06/13/2014  . Encounter for general adult medical examination without abnormal findings 06/13/2014  . Neuropathy ([place]) 03/07/2014  . Urinary incontinence 03/07/2014  . Left carotid bruit 03/07/2014  . Nephrolithiasis 03/07/2014  .  Calculus of kidney 03/07/2014  . Other specified symptoms and signs involving the circulatory and respiratory systems 03/07/2014  . Polyneuropathy ([place]) 03/07/2014  . Hypercholesterolemia 03/04/2014  . Essential hypertension 03/04/2014  . Colon cancer ([place]) 03/04/2014  . Essential (primary) hypertension 03/04/2014  . Malignant neoplasm of colon ([place]) 03/04/2014  . Pure hypercholesterolemia 03/04/2014  . Type 2 diabetes mellitus ([place]) 07/14/2013    Andrey Spearman, MS, OTR/L, [facility] 08/10/2015 2:30 PM  [place] MAIN [facility] SERVICES [address] [place], [place], [address] Phone: [phone]   Fax:  [phone]  Name: Christina Mathis MRN: [id number] Date of Birth: [birth year]

## 2015-08-10 NOTE — Patient Instructions (Signed)
LE instructions and precautions as established- see initial eval.   

## 2015-08-11 ENCOUNTER — Ambulatory Visit: Payer: Medicare Other | Admitting: Occupational Therapy

## 2015-08-11 DIAGNOSIS — I890 Lymphedema, not elsewhere classified: Secondary | ICD-10-CM | POA: Diagnosis not present

## 2015-08-11 NOTE — Therapy (Signed)
[place] MAIN [facility] SERVICES [address] [place], [place], [address] Phone: [phone]   Fax:  [phone]  Occupational Therapy Treatment  Patient Details  Name: Christina Mathis MRN: [id number] Date of Birth: 07/29/1935 No Data Recorded  Encounter Date: 08/11/2015      OT End of Session - 08/11/15 1105    Visit Number 6   Number of Visits 36   Date for OT Re-Evaluation 10/20/15   OT Start Time 1006   OT Stop Time 1100   OT Time Calculation (min) 54 min      Past Medical History  Diagnosis Date  . Hypertension   . Hypercholesteremia   . Colon cancer ([place])   . History of shingles     twice  . Urine incontinence 03/06/2013    h/o  . Nephrolithiasis   . Neuropathy ([place])   . Left carotid bruit     Past Surgical History  Procedure Laterality Date  . Cesarean section    . Appendectomy  1979    colorectal  . Ventral hernia repair Right [id number]  . Surgery for colorectal cancer    . Colostomy    . Colonoscopy with propofol N/A 03/21/2015    Procedure: COLONOSCOPY WITH PROPOFOL;  Surgeon: Hulen Luster, MD;  Location: [facility] ENDOSCOPY;  Service: Gastroenterology;  Laterality: N/A;    There were no vitals filed for this visit.      Subjective Assessment - 08/11/15 1058    Subjective  Pt presents for OT treatment visit 4 to address LLE LE . Pt reports she's been unable to manage compression wraps during visit intervals b/c of shoulder pain. Pt  gets sterroid injection in L shoulder tomorrow and expects enough releif to be able to participate in [place] Rx more.   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 S/p surgery and colonostomy 03/2013. 01/2014 bilateral hernia repairs. Post chemo neuropathy B feet. Hx frequent falls last yearnever smoker.   Limitations decreased sensation B feet, falls risk, LLE pain and swelling, decreased standing  tolerance and balance, difficulty walking   Patient Stated Goals decrease swelling and keep it from getting worse so it doesnt affect my doing things   Currently in Pain? No/denies  not numerically rated   Pain Onset More than a month ago                      OT Treatments/Exercises (OP) - 08/11/15 0001    ADLs   ADL Education Given Yes   Manual Therapy   Manual Therapy Edema management;Manual Lymphatic Drainage (MLD);Other (comment)   Manual Lymphatic Drainage (MLD) LLE/LLQ manual lymph drainage (MLD) in supine and R side lying utilizing, in addition to groin and any in tact deep abdominal lymphatics, the  ipsilateral axillo-inguinal anastamosis and deep abdominal lymphatics.  No deep strokes to abdomen due to ostomy. Sequence included stationary J strokes to complete bilateral "short neck" sequence, patient directed diaphragmatic breathing, stationary strokes to L axilla, and then dynamic strokes along axillo-inguinal pathway from proximal to distal across transverse watershed. Dynamic strokes to the thigh, knee, leg and foot from proximal to distal, and then reverse along pathways back to terminus. No fibrosis techniques today.   Compression Bandaging No LLE wraps today. Continuing trial w/ loaned Juze ccl 2 AD, sz 3 over weekend.   Other Manual Therapy skin care  OT Education - 08/11/15 1104    Education provided Yes   Education Details Continued skilled Pt/caregiver Education  And LE ADL training throughout visit for lymphedema self care, including compression wrapping, compression garment and device wear/care, lymphatic pumping ther ex, simple self-MLD, and skin care. Discussed progress towards goals.             OT Long Term Goals - 07/22/15 1508    OT LONG TERM GOAL #1   Title Lymphedema (LE) self-care: Pt able to apply multi layered, gradient compression wraps independently using proper techniques within 2 weeks to achieve optimal limb  volume reduction.   Baseline dependent   Time 2   Period Weeks   Status New   OT LONG TERM GOAL #2   Title Lymphedema (LE) self-care:  Pt to achieve at least 10% LLE limb volume reductions during Intensive CDT to limit LE progression, decrease infection risk, to reduce pain/ discomfort, and to improve safe ambulation and functional mobility.   Baseline dependent   Time 12   Period Weeks   Status New   OT LONG TERM GOAL #3   Title Lymphedema (LE) self-care:  Pt >/= 85 % compliant with all daily, LE self-care protocols for home program, including simple self-manual lymphatic drainage (MLD), skin care, lymphatic pumping the ex, skin care, and donning/ doffing compression wraps and garments o limit LE progression and further functional decline.     Baseline dependent   Time 12   Period Weeks   Status New   OT LONG TERM GOAL #4   Title Lymphedema (LE) self-care:  Pt able to perform all lymphedema self-care protocols independently within 12 weeks to limit progression and to decrease associated pain to improve functional performance in all domains.   Baseline dependent   Time 12   Period Weeks   Status New   OT LONG TERM GOAL #5   Title Lymphedema (LE) self-care:  During Management Phase CDT Pt to sustain limb volume reductions achieved during Intensive Phase CDT within 5% utilizing LE self-care protocols, appropriate compression garments/ devices, and needed level of caregiver assistance.   Baseline dependent   Time 6   Period Months   Status New               Plan - 08/11/15 1105    Clinical Impression Statement Pt able to manage loaned ccl 1 compression garment over night using friction gloves and loaned slippie sock Swelling appears fairly well managed by visual assessment, but  by palpation tissue density is moderatey congested. Pt agrees with plan to continue w/ loaned garment trial over the weekend. Considering keeping size 3 for ease of donning and doffing, but increasing  compression class from 1 to 2. Pt tolerating all aspects of treatment without difficulty. LE responding well.    Rehab Potential Good   OT Frequency 3x / week   OT Duration 12 weeks   OT Treatment/Interventions Self-care/ADL training;DME and/or AE instruction;Manual lymph drainage;Patient/family education;Compression bandaging;Therapeutic exercises;Therapeutic activities;Manual Therapy   Consulted and Agree with Plan of Care Patient      Patient will benefit from skilled therapeutic intervention in order to improve the following deficits and impairments:  Decreased knowledge of use of DME, Decreased skin integrity, Increased edema, Impaired flexibility, Pain, Decreased mobility, Impaired sensation, Decreased activity tolerance, Decreased range of motion, Decreased balance, Decreased knowledge of precautions, Difficulty walking  Visit Diagnosis: Lymphedema, not elsewhere classified    Problem List Patient Active Problem List   Diagnosis Date  Noted  . Blood glucose elevated 07/12/2015  . Carotid artery disease ([place]) 07/06/2015  . Subclavian arterial stenosis ([place]) 06/14/2015  . Left shoulder pain 02/14/2015  . Left knee pain 02/14/2015  . Gonalgia 02/14/2015  . Pain in shoulder 02/14/2015  . Cancer of rectum ([place]) 01/11/2015  . Malignant neoplasm of rectum ([place]) 01/11/2015  . Health care maintenance 06/13/2014  . Encounter for general adult medical examination without abnormal findings 06/13/2014  . Neuropathy ([place]) 03/07/2014  . Urinary incontinence 03/07/2014  . Left carotid bruit 03/07/2014  . Nephrolithiasis 03/07/2014  . Calculus of kidney 03/07/2014  . Other specified symptoms and signs involving the circulatory and respiratory systems 03/07/2014  . Polyneuropathy ([place]) 03/07/2014  . Hypercholesterolemia 03/04/2014  . Essential hypertension 03/04/2014  . Colon cancer ([place]) 03/04/2014  . Essential (primary) hypertension 03/04/2014  . Malignant neoplasm of colon ([place])  03/04/2014  . Pure hypercholesterolemia 03/04/2014  . Type 2 diabetes mellitus ([place]) 07/14/2013    Andrey Spearman, MS, OTR/L, [facility] 08/11/2015 11:09 AM  [place] MAIN [facility] SERVICES [address] [place], [place], [address] Phone: [phone]   Fax:  [phone]  Name: Christina Mathis MRN: [id number] Date of Birth: 12/31/1935

## 2015-08-15 ENCOUNTER — Ambulatory Visit: Payer: Medicare Other | Admitting: Occupational Therapy

## 2015-08-15 DIAGNOSIS — I890 Lymphedema, not elsewhere classified: Secondary | ICD-10-CM | POA: Diagnosis not present

## 2015-08-15 NOTE — Therapy (Signed)
[place] MAIN [facility] SERVICES [address] [place], [place], [address] Phone: [phone]   Fax:  [phone]  Occupational Therapy Treatment  Patient Details  Name: Christina Mathis MRN: [id number] Date of Birth: Nov 28, 1935 No Data Recorded  Encounter Date: 08/15/2015      OT End of Session - 08/15/15 1327    Visit Number 7   Number of Visits 36   Date for OT Re-Evaluation 10/20/15   OT Start Time 0105   OT Stop Time 0205   OT Time Calculation (min) 60 min      Past Medical History  Diagnosis Date  . Hypertension   . Hypercholesteremia   . Colon cancer ([place])   . History of shingles     twice  . Urine incontinence 03/06/2013    h/o  . Nephrolithiasis   . Neuropathy ([place])   . Left carotid bruit     Past Surgical History  Procedure Laterality Date  . Cesarean section    . Appendectomy  1979    colorectal  . Ventral hernia repair Right [id number]  . Surgery for colorectal cancer    . Colostomy    . Colonoscopy with propofol N/A 03/21/2015    Procedure: COLONOSCOPY WITH PROPOFOL;  Surgeon: Hulen Luster, MD;  Location: [facility] ENDOSCOPY;  Service: Gastroenterology;  Laterality: N/A;    There were no vitals filed for this visit.      Subjective Assessment - 08/15/15 1325    Subjective  (p) Pt presents for OT treatment visit 7 to address LLE LE . Pt reports she wore compression garments over the weekend with good success.    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 S/p surgery and colonostomy 03/2013. 01/2014 bilateral hernia repairs. Post chemo neuropathy B feet. Hx frequent falls last yearnever smoker.   Limitations decreased sensation B feet, falls risk, LLE pain and swelling, decreased standing tolerance and balance, difficulty walking   Patient Stated Goals decrease swelling and keep it from getting worse so it doesnt affect my doing  things   Currently in Pain? No/denies   Pain Onset More than a month ago             LYMPHEDEMA/ONCOLOGY QUESTIONNAIRE - 08/15/15 1624    Left Lower Extremity Lymphedema   Other LE A-D= 3069.22 ml, which is decreased by 7.75 % since initially measured on 07/29/15   Other LLE A-G = 9596.91 ml, which is decreased by 4.89% since initially measured on 07/29/15                 OT Treatments/Exercises (OP) - 08/15/15 0001    ADLs   ADL Education Given Yes   Manual Therapy   Manual Therapy Edema management;Manual Lymphatic Drainage (MLD);Other (comment)   Manual therapy comments LLE comparative limb volumetrics   Manual Lymphatic Drainage (MLD) LLE/LLQ manual lymph drainage (MLD) in supine and R side lying utilizing, in addition to groin and any in tact deep abdominal lymphatics, the  ipsilateral axillo-inguinal anastamosis and deep abdominal lymphatics.  No deep strokes to abdomen due to ostomy. Sequence included stationary J strokes to complete bilateral "short neck" sequence, patient directed diaphragmatic breathing, stationary strokes to L axilla, and then dynamic strokes along axillo-inguinal pathway from proximal to distal across transverse watershed. Dynamic strokes to the thigh, knee, leg and foot from proximal to distal, and then reverse along pathways back to terminus.  No fibrosis techniques today.   Compression Bandaging No LLE wraps today. Continuing trial w/ loaned Juze ccl 2 AD, sz 3 over weekend.   Other Manual Therapy skin care                OT Education - 08/15/15 1620    Education provided Yes   Education Details Continued skilled Pt/caregiver Education  And LE ADL training throughout visit for lymphedema self care, including compression wrapping, compression garment and device wear/care, lymphatic pumping ther ex, simple self-MLD, and skin care. Discussed progress towards goals.   Methods Explanation             OT Long Term Goals - 07/22/15 1508     OT LONG TERM GOAL #1   Title Lymphedema (LE) self-care: Pt able to apply multi layered, gradient compression wraps independently using proper techniques within 2 weeks to achieve optimal limb volume reduction.   Baseline dependent   Time 2   Period Weeks   Status New   OT LONG TERM GOAL #2   Title Lymphedema (LE) self-care:  Pt to achieve at least 10% LLE limb volume reductions during Intensive CDT to limit LE progression, decrease infection risk, to reduce pain/ discomfort, and to improve safe ambulation and functional mobility.   Baseline dependent   Time 12   Period Weeks   Status New   OT LONG TERM GOAL #3   Title Lymphedema (LE) self-care:  Pt >/= 85 % compliant with all daily, LE self-care protocols for home program, including simple self-manual lymphatic drainage (MLD), skin care, lymphatic pumping the ex, skin care, and donning/ doffing compression wraps and garments o limit LE progression and further functional decline.     Baseline dependent   Time 12   Period Weeks   Status New   OT LONG TERM GOAL #4   Title Lymphedema (LE) self-care:  Pt able to perform all lymphedema self-care protocols independently within 12 weeks to limit progression and to decrease associated pain to improve functional performance in all domains.   Baseline dependent   Time 12   Period Weeks   Status New   OT LONG TERM GOAL #5   Title Lymphedema (LE) self-care:  During Management Phase CDT Pt to sustain limb volume reductions achieved during Intensive Phase CDT within 5% utilizing LE self-care protocols, appropriate compression garments/ devices, and needed level of caregiver assistance.   Baseline dependent   Time 6   Period Months   Status New               Plan - 08/15/15 1628    Clinical Impression Statement LLE volumetrics taken today reveal limb volume decreases at both below the knee (A-D) and ankle to groin (A-G) landmarks. A-D decrease measures 7.75%, and A-G reduction measures  4.89%. Pt tolerating compression garment  trial and agrees w/ plan to decrease size from 3 to 2 and ccl from 1 to 2.    Rehab Potential Good   OT Frequency 3x / week   OT Duration 12 weeks   OT Treatment/Interventions Self-care/ADL training;DME and/or AE instruction;Manual lymph drainage;Patient/family education;Compression bandaging;Therapeutic exercises;Therapeutic activities;Manual Therapy   Consulted and Agree with Plan of Care Patient      Patient will benefit from skilled therapeutic intervention in order to improve the following deficits and impairments:  Decreased knowledge of use of DME, Decreased skin integrity, Increased edema, Impaired flexibility, Pain, Decreased mobility, Impaired sensation, Decreased activity tolerance, Decreased range of motion, Decreased balance, Decreased  knowledge of precautions, Difficulty walking  Visit Diagnosis: Lymphedema, not elsewhere classified    Problem List Patient Active Problem List   Diagnosis Date Noted  . Blood glucose elevated 07/12/2015  . Carotid artery disease ([place]) 07/06/2015  . Subclavian arterial stenosis ([place]) 06/14/2015  . Left shoulder pain 02/14/2015  . Left knee pain 02/14/2015  . Gonalgia 02/14/2015  . Pain in shoulder 02/14/2015  . Cancer of rectum ([place]) 01/11/2015  . Malignant neoplasm of rectum ([place]) 01/11/2015  . Health care maintenance 06/13/2014  . Encounter for general adult medical examination without abnormal findings 06/13/2014  . Neuropathy ([place]) 03/07/2014  . Urinary incontinence 03/07/2014  . Left carotid bruit 03/07/2014  . Nephrolithiasis 03/07/2014  . Calculus of kidney 03/07/2014  . Other specified symptoms and signs involving the circulatory and respiratory systems 03/07/2014  . Polyneuropathy ([place]) 03/07/2014  . Hypercholesterolemia 03/04/2014  . Essential hypertension 03/04/2014  . Colon cancer ([place]) 03/04/2014  . Essential (primary) hypertension 03/04/2014  . Malignant neoplasm of colon  ([place]) 03/04/2014  . Pure hypercholesterolemia 03/04/2014  . Type 2 diabetes mellitus ([place]) 07/14/2013   Andrey Spearman, MS, OTR/L, [facility] 08/15/2015 4:31 PM  [place] MAIN [facility] SERVICES [address] [place], [place], [address] Phone: [phone]   Fax:  [phone]  Name: Christina Mathis MRN: [id number] Date of Birth: 07-03-1935

## 2015-08-17 ENCOUNTER — Ambulatory Visit: Payer: Medicare Other | Admitting: Occupational Therapy

## 2015-08-17 DIAGNOSIS — I890 Lymphedema, not elsewhere classified: Secondary | ICD-10-CM | POA: Diagnosis not present

## 2015-08-17 NOTE — Therapy (Signed)
[place] MAIN [facility] SERVICES [address] [place], [place], [address] Phone: [phone]   Fax:  [phone]  Occupational Therapy Treatment  Patient Details  Name: Christina Mathis MRN: [id number] Date of Birth: [birth year] No Data Recorded  Encounter Date: 08/17/2015      OT End of Session - 08/17/15 1459    Visit Number 8   Number of Visits 36   Date for OT Re-Evaluation 10/20/15   OT Start Time 0105   OT Stop Time 0210   OT Time Calculation (min) 65 min      Past Medical History  Diagnosis Date  . Hypertension   . Hypercholesteremia   . Colon cancer ([place])   . History of shingles     twice  . Urine incontinence 03/06/2013    h/o  . Nephrolithiasis   . Neuropathy ([place])   . Left carotid bruit     Past Surgical History  Procedure Laterality Date  . Cesarean section    . Appendectomy  1979    colorectal  . Ventral hernia repair Right [id number]  . Surgery for colorectal cancer    . Colostomy    . Colonoscopy with propofol N/A 03/21/2015    Procedure: COLONOSCOPY WITH PROPOFOL;  Surgeon: Hulen Luster, MD;  Location: [facility] ENDOSCOPY;  Service: Gastroenterology;  Laterality: N/A;    There were no vitals filed for this visit.      Subjective Assessment - 08/17/15 1459    Subjective  Pt presents for OT treatment visit 7 to address LLE LE . Pt reports she wore compression garments between visits as instructed without difficulty.    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 S/p surgery and colonostomy 03/2013. 01/2014 bilateral hernia repairs. Post chemo neuropathy B feet. Hx frequent falls last yearnever smoker.   Limitations decreased sensation B feet, falls risk, LLE pain and swelling, decreased standing tolerance and balance, difficulty walking   Patient Stated Goals decrease swelling and keep it from getting worse so it doesnt affect my  doing things   Currently in Pain? No/denies   Pain Onset More than a month ago                      OT Treatments/Exercises (OP) - 08/17/15 0001    ADLs   ADL Education Given Yes   Manual Therapy   Manual Therapy Edema management;Manual Lymphatic Drainage (MLD);Other (comment)   Manual Lymphatic Drainage (MLD) LLE/LLQ manual lymph drainage (MLD) in supine and R side lying utilizing, in addition to groin and any in tact deep abdominal lymphatics, the  ipsilateral axillo-inguinal anastamosis and deep abdominal lymphatics.  No deep strokes to abdomen due to ostomy. Sequence included stationary J strokes to complete bilateral "short neck" sequence, patient directed diaphragmatic breathing, stationary strokes to L axilla, and then dynamic strokes along axillo-inguinal pathway from proximal to distal across transverse watershed. Dynamic strokes to the thigh, knee, leg and foot from proximal to distal, and then reverse along pathways back to terminus. No fibrosis techniques today.   Compression Bandaging No LLE wraps today. Continuing trial w/ loaned Juze ccl 2 AD, sz 3 over weekend.   Other Manual Therapy skin care w/ castor oil during MLD                OT Education - 08/17/15 1502    Education provided Yes   Education  Details Continued Pt edu for LE ADLs w/ emphasis on daily simple self MLD    Person(s) Educated Patient   Methods Explanation;Demonstration;Tactile cues;Verbal cues;Handout   Comprehension Verbalized understanding;Returned demonstration;Verbal cues required;Tactile cues required             OT Long Term Goals - 07/22/15 1508    OT LONG TERM GOAL #1   Title Lymphedema (LE) self-care: Pt able to apply multi layered, gradient compression wraps independently using proper techniques within 2 weeks to achieve optimal limb volume reduction.   Baseline dependent   Time 2   Period Weeks   Status New   OT LONG TERM GOAL #2   Title Lymphedema (LE)  self-care:  Pt to achieve at least 10% LLE limb volume reductions during Intensive CDT to limit LE progression, decrease infection risk, to reduce pain/ discomfort, and to improve safe ambulation and functional mobility.   Baseline dependent   Time 12   Period Weeks   Status New   OT LONG TERM GOAL #3   Title Lymphedema (LE) self-care:  Pt >/= 85 % compliant with all daily, LE self-care protocols for home program, including simple self-manual lymphatic drainage (MLD), skin care, lymphatic pumping the ex, skin care, and donning/ doffing compression wraps and garments o limit LE progression and further functional decline.     Baseline dependent   Time 12   Period Weeks   Status New   OT LONG TERM GOAL #4   Title Lymphedema (LE) self-care:  Pt able to perform all lymphedema self-care protocols independently within 12 weeks to limit progression and to decrease associated pain to improve functional performance in all domains.   Baseline dependent   Time 12   Period Weeks   Status New   OT LONG TERM GOAL #5   Title Lymphedema (LE) self-care:  During Management Phase CDT Pt to sustain limb volume reductions achieved during Intensive Phase CDT within 5% utilizing LE self-care protocols, appropriate compression garments/ devices, and needed level of caregiver assistance.   Baseline dependent   Time 6   Period Months   Status New               Plan - 08/17/15 1503    Clinical Impression Statement Pt diligently practices LE home program elements between visits. Pt engaged in all aspects of OT for CDT. Mild LLE swelling and increased tissue density below the knee is persistant despite measurable limb volume reduction . Pt able to perform simple self MLD with edu and mod assist today. Pt able to don loaned compression knee high with extra time and no assistive devices.    Rehab Potential Good   OT Frequency 3x / week   OT Duration 12 weeks   OT Treatment/Interventions Self-care/ADL  training;DME and/or AE instruction;Manual lymph drainage;Patient/family education;Compression bandaging;Therapeutic exercises;Therapeutic activities;Manual Therapy   Consulted and Agree with Plan of Care Patient      Patient will benefit from skilled therapeutic intervention in order to improve the following deficits and impairments:  Decreased knowledge of use of DME, Decreased skin integrity, Increased edema, Impaired flexibility, Pain, Decreased mobility, Impaired sensation, Decreased activity tolerance, Decreased range of motion, Decreased balance, Decreased knowledge of precautions, Difficulty walking  Visit Diagnosis: Lymphedema, not elsewhere classified    Problem List Patient Active Problem List   Diagnosis Date Noted  . Blood glucose elevated 07/12/2015  . Carotid artery disease ([place]) 07/06/2015  . Subclavian arterial stenosis ([place]) 06/14/2015  . Left shoulder pain 02/14/2015  .  Left knee pain 02/14/2015  . Gonalgia 02/14/2015  . Pain in shoulder 02/14/2015  . Cancer of rectum ([place]) 01/11/2015  . Malignant neoplasm of rectum ([place]) 01/11/2015  . Health care maintenance 06/13/2014  . Encounter for general adult medical examination without abnormal findings 06/13/2014  . Neuropathy ([place]) 03/07/2014  . Urinary incontinence 03/07/2014  . Left carotid bruit 03/07/2014  . Nephrolithiasis 03/07/2014  . Calculus of kidney 03/07/2014  . Other specified symptoms and signs involving the circulatory and respiratory systems 03/07/2014  . Polyneuropathy ([place]) 03/07/2014  . Hypercholesterolemia 03/04/2014  . Essential hypertension 03/04/2014  . Colon cancer ([place]) 03/04/2014  . Essential (primary) hypertension 03/04/2014  . Malignant neoplasm of colon ([place]) 03/04/2014  . Pure hypercholesterolemia 03/04/2014  . Type 2 diabetes mellitus ([place]) 07/14/2013    Andrey Spearman, MS, OTR/L, [facility] 08/17/2015 3:07 PM  [place] MAIN [facility]  SERVICES [address] [place], [place], [address] Phone: [phone]   Fax:  [phone]  Name: Rivkah Baye MRN: [id number] Date of Birth: September 28, 1935

## 2015-08-17 NOTE — Patient Instructions (Signed)
LE precautions and home program as established at initial eval

## 2015-08-19 ENCOUNTER — Ambulatory Visit: Payer: Medicare Other | Admitting: Occupational Therapy

## 2015-08-19 DIAGNOSIS — I890 Lymphedema, not elsewhere classified: Secondary | ICD-10-CM

## 2015-08-19 NOTE — Patient Instructions (Signed)
LE instructions and precautions as established- see initial eval.   

## 2015-08-19 NOTE — Therapy (Signed)
[place] MAIN [facility] SERVICES [address] [place], [place], [address] Phone: [phone]   Fax:  [phone]  Occupational Therapy Treatment  Patient Details  Name: Christina Mathis MRN: [id number] Date of Birth: [birth year] No Data Recorded  Encounter Date: 08/19/2015      OT End of Session - 08/19/15 1132    Visit Number 9   Number of Visits 36   Date for OT Re-Evaluation 10/20/15   OT Start Time 1010   OT Stop Time 1112   OT Time Calculation (min) 62 min      Past Medical History  Diagnosis Date  . Hypertension   . Hypercholesteremia   . Colon cancer ([place])   . History of shingles     twice  . Urine incontinence 03/06/2013    h/o  . Nephrolithiasis   . Neuropathy ([place])   . Left carotid bruit     Past Surgical History  Procedure Laterality Date  . Cesarean section    . Appendectomy  1979    colorectal  . Ventral hernia repair Right [id number]  . Surgery for colorectal cancer    . Colostomy    . Colonoscopy with propofol N/A 03/21/2015    Procedure: COLONOSCOPY WITH PROPOFOL;  Surgeon: Hulen Luster, MD;  Location: [facility] ENDOSCOPY;  Service: Gastroenterology;  Laterality: N/A;    There were no vitals filed for this visit.      Subjective Assessment - 08/19/15 1126    Subjective  Pt presents for OT treatment visit 8 to address LLE LE . Pt has no new complaints today. She is engaged in all aspects of therapy. Emphasis of session today on providing skilled pt edu for simple self MLD as part of LE self care program.   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 S/p surgery and colonostomy 03/2013. 01/2014 bilateral hernia repairs. Post chemo neuropathy B feet. Hx frequent falls last yearnever smoker.   Limitations decreased sensation B feet, falls risk, LLE pain and swelling, decreased standing tolerance and balance, difficulty walking   Patient Stated Goals decrease swelling and  keep it from getting worse so it doesnt affect my doing things   Currently in Pain? Yes  L shoulder stiffness   Pain Onset More than a month ago                      OT Treatments/Exercises (OP) - 08/19/15 0001    ADLs   ADL Education Given Yes   Manual Therapy   Manual Therapy Edema management;Manual Lymphatic Drainage (MLD);Other (comment)   Manual Lymphatic Drainage (MLD) LLE/LLQ manual lymph drainage (MLD) in supine and R side lying utilizing, in addition to groin and any in tact deep abdominal lymphatics, the  ipsilateral axillo-inguinal anastamosis and deep abdominal lymphatics.  No deep strokes to abdomen due to ostomy. Sequence included stationary J strokes to complete bilateral "short neck" sequence, patient directed diaphragmatic breathing, stationary strokes to L axilla, and then dynamic strokes along axillo-inguinal pathway from proximal to distal across transverse watershed. Dynamic strokes to the thigh, knee, leg and foot from proximal to distal, and then reverse along pathways back to terminus. No fibrosis techniques today.   Compression Bandaging No LLE wraps today. Continuing trial w/ loaned Juze ccl 2 AD, sz 3 over weekend.   Other Manual Therapy skin care w/ castor oil during MLD  OT Education - 08/19/15 1131    Education provided Yes   Education Details Emphasis of skilled LE self-care training today on  simple self-MLD. Pt has mastered J stroke. She completed entire leg sequence with max assist and cues. Technique will improve with practice.   Person(s) Educated Patient   Methods Explanation;Demonstration;Tactile cues;Verbal cues;Handout   Comprehension Verbalized understanding;Returned demonstration;Verbal cues required;Tactile cues required             OT Long Term Goals - 07/22/15 1508    OT LONG TERM GOAL #1   Title Lymphedema (LE) self-care: Pt able to apply multi layered, gradient compression wraps independently using  proper techniques within 2 weeks to achieve optimal limb volume reduction.   Baseline dependent   Time 2   Period Weeks   Status New   OT LONG TERM GOAL #2   Title Lymphedema (LE) self-care:  Pt to achieve at least 10% LLE limb volume reductions during Intensive CDT to limit LE progression, decrease infection risk, to reduce pain/ discomfort, and to improve safe ambulation and functional mobility.   Baseline dependent   Time 12   Period Weeks   Status New   OT LONG TERM GOAL #3   Title Lymphedema (LE) self-care:  Pt >/= 85 % compliant with all daily, LE self-care protocols for home program, including simple self-manual lymphatic drainage (MLD), skin care, lymphatic pumping the ex, skin care, and donning/ doffing compression wraps and garments o limit LE progression and further functional decline.     Baseline dependent   Time 12   Period Weeks   Status New   OT LONG TERM GOAL #4   Title Lymphedema (LE) self-care:  Pt able to perform all lymphedema self-care protocols independently within 12 weeks to limit progression and to decrease associated pain to improve functional performance in all domains.   Baseline dependent   Time 12   Period Weeks   Status New   OT LONG TERM GOAL #5   Title Lymphedema (LE) self-care:  During Management Phase CDT Pt to sustain limb volume reductions achieved during Intensive Phase CDT within 5% utilizing LE self-care protocols, appropriate compression garments/ devices, and needed level of caregiver assistance.   Baseline dependent   Time 6   Period Months   Status New               Plan - 08/19/15 1133    Clinical Impression Statement Pt makes progress towards all self care golas. Today she was able to complete entire leg MLD sequence with extra time and max A. She continues to diligently perform LE ther ex, to utilize compression consistently , and skin is in good condition. Pt will order compression garments on like over the weekend.   Rehab  Potential Good   OT Frequency 3x / week   OT Duration 12 weeks   OT Treatment/Interventions Self-care/ADL training;DME and/or AE instruction;Manual lymph drainage;Patient/family education;Compression bandaging;Therapeutic exercises;Therapeutic activities;Manual Therapy   Consulted and Agree with Plan of Care Patient      Patient will benefit from skilled therapeutic intervention in order to improve the following deficits and impairments:  Decreased knowledge of use of DME, Decreased skin integrity, Increased edema, Impaired flexibility, Pain, Decreased mobility, Impaired sensation, Decreased activity tolerance, Decreased range of motion, Decreased balance, Decreased knowledge of precautions, Difficulty walking  Visit Diagnosis: Lymphedema, not elsewhere classified    Problem List Patient Active Problem List   Diagnosis Date Noted  . Blood glucose elevated 07/12/2015  .  Carotid artery disease ([place]) 07/06/2015  . Subclavian arterial stenosis ([place]) 06/14/2015  . Left shoulder pain 02/14/2015  . Left knee pain 02/14/2015  . Gonalgia 02/14/2015  . Pain in shoulder 02/14/2015  . Cancer of rectum ([place]) 01/11/2015  . Malignant neoplasm of rectum ([place]) 01/11/2015  . Health care maintenance 06/13/2014  . Encounter for general adult medical examination without abnormal findings 06/13/2014  . Neuropathy ([place]) 03/07/2014  . Urinary incontinence 03/07/2014  . Left carotid bruit 03/07/2014  . Nephrolithiasis 03/07/2014  . Calculus of kidney 03/07/2014  . Other specified symptoms and signs involving the circulatory and respiratory systems 03/07/2014  . Polyneuropathy ([place]) 03/07/2014  . Hypercholesterolemia 03/04/2014  . Essential hypertension 03/04/2014  . Colon cancer ([place]) 03/04/2014  . Essential (primary) hypertension 03/04/2014  . Malignant neoplasm of colon ([place]) 03/04/2014  . Pure hypercholesterolemia 03/04/2014  . Type 2 diabetes mellitus ([place]) 07/14/2013   Andrey Spearman, MS,  OTR/L, [facility] 08/19/2015 11:36 AM  [place] MAIN [facility] SERVICES [address] [place], [place], [address] Phone: [phone]   Fax:  [phone]  Name: Christina Mathis MRN: [id number] Date of Birth: September 02, 1935

## 2015-08-22 ENCOUNTER — Ambulatory Visit: Payer: Medicare Other | Admitting: Occupational Therapy

## 2015-08-22 ENCOUNTER — Encounter: Payer: Self-pay | Admitting: Occupational Therapy

## 2015-08-22 DIAGNOSIS — I890 Lymphedema, not elsewhere classified: Secondary | ICD-10-CM | POA: Diagnosis not present

## 2015-08-22 NOTE — Patient Instructions (Signed)
LE instructions and precautions as established- see initial eval.   

## 2015-08-22 NOTE — Therapy (Signed)
[place] MAIN [facility] SERVICES [address] [place], [place], [address] Phone: [phone]   Fax:  [phone]  Occupational Therapy Treatment  Patient Details  Name: Christina Mathis MRN: [id number] Date of Birth: Apr 07, 1935 No Data Recorded  Encounter Date: 08/22/2015      OT End of Session - 08/22/15 1456    Visit Number 10   Number of Visits 36   Date for OT Re-Evaluation 10/20/15   OT Start Time 0104   OT Stop Time 0203   OT Time Calculation (min) 59 min   Activity Tolerance Patient tolerated treatment well;No increased pain   Behavior During Therapy WFL for tasks assessed/performed      Past Medical History:  Diagnosis Date  . Colon cancer ([place])   . History of shingles    twice  . Hypercholesteremia   . Hypertension   . Left carotid bruit   . Nephrolithiasis   . Neuropathy ([place])   . Urine incontinence 03/06/2013   h/o    Past Surgical History:  Procedure Laterality Date  . APPENDECTOMY  1979   colorectal  . CESAREAN SECTION    . COLONOSCOPY WITH PROPOFOL N/A 03/21/2015   Procedure: COLONOSCOPY WITH PROPOFOL;  Surgeon: Hulen Luster, MD;  Location: [facility] ENDOSCOPY;  Service: Gastroenterology;  Laterality: N/A;  . COLOSTOMY    . surgery for colorectal cancer    . VENTRAL HERNIA REPAIR Right [id number]    There were no vitals filed for this visit.      Subjective Assessment - 08/22/15 1314    Subjective  Pt presents for OT treatment visit 9 to address LLE LE . Pt has no new complaints today.    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 S/p surgery and colonostomy 03/2013. 01/2014 bilateral hernia repairs. Post chemo neuropathy B feet. Hx frequent falls last yearnever smoker.   Limitations decreased sensation B feet, falls risk, LLE pain and swelling, decreased standing tolerance and balance, difficulty walking   Patient Stated  Goals decrease swelling and keep it from getting worse so it doesnt affect my doing things   Pain Onset More than a month ago                      OT Treatments/Exercises (OP) - 08/22/15 0001      Manual Therapy   Manual Therapy Edema management;Manual Lymphatic Drainage (MLD);Other (comment)   Manual Lymphatic Drainage (MLD) LLE/LLQ manual lymph drainage (MLD) in supine and R side lying utilizing, in addition to groin and any in tact deep abdominal lymphatics, the  ipsilateral axillo-inguinal anastamosis and deep abdominal lymphatics.  No deep strokes to abdomen due to ostomy. Sequence included stationary J strokes to complete bilateral "short neck" sequence, patient directed diaphragmatic breathing, stationary strokes to L axilla, and then dynamic strokes along axillo-inguinal pathway from proximal to distal across transverse watershed. Dynamic strokes to the thigh, knee, leg and foot from proximal to distal, and then reverse along pathways back to terminus. No fibrosis techniques today.   Compression Bandaging No LLE wraps today. Continuing trial w/ loaned Juze ccl 2 AD, sz 3 over weekend.   Other Manual Therapy skin care w/ castor oil during MLD                OT Education - 08/22/15 1456    Education provided No  OT Long Term Goals - 07/22/15 1508      OT LONG TERM GOAL #1   Title Lymphedema (LE) self-care: Pt able to apply multi layered, gradient compression wraps independently using proper techniques within 2 weeks to achieve optimal limb volume reduction.   Baseline dependent   Time 2   Period Weeks   Status New     OT LONG TERM GOAL #2   Title Lymphedema (LE) self-care:  Pt to achieve at least 10% LLE limb volume reductions during Intensive CDT to limit LE progression, decrease infection risk, to reduce pain/ discomfort, and to improve safe ambulation and functional mobility.   Baseline dependent   Time 12   Period Weeks   Status New      OT LONG TERM GOAL #3   Title Lymphedema (LE) self-care:  Pt >/= 85 % compliant with all daily, LE self-care protocols for home program, including simple self-manual lymphatic drainage (MLD), skin care, lymphatic pumping the ex, skin care, and donning/ doffing compression wraps and garments o limit LE progression and further functional decline.     Baseline dependent   Time 12   Period Weeks   Status New     OT LONG TERM GOAL #4   Title Lymphedema (LE) self-care:  Pt able to perform all lymphedema self-care protocols independently within 12 weeks to limit progression and to decrease associated pain to improve functional performance in all domains.   Baseline dependent   Time 12   Period Weeks   Status New     OT LONG TERM GOAL #5   Title Lymphedema (LE) self-care:  During Management Phase CDT Pt to sustain limb volume reductions achieved during Intensive Phase CDT within 5% utilizing LE self-care protocols, appropriate compression garments/ devices, and needed level of caregiver assistance.   Baseline dependent   Time 6   Period Months   Status New               Plan - August 29, 2015 1458    Clinical Impression Statement Pt diligently performing skin care and ther ex, but she remarks that she didn't practice simple self MLD over the past weekend. Swelling appears well controlled today, despit very hot outdoor temps. Pt understands plan: once compression grments are fitted we will decrease visit frequency and monitor w/ f/o intermittently.      Patient will benefit from skilled therapeutic intervention in order to improve the following deficits and impairments:     Visit Diagnosis: Lymphedema, not elsewhere classified      G-Codes - 08-29-2015 1500    Functional Assessment Tool Used Clinical observation and examination, medical records review, Pt and caregiver interview, comparative limb volumetrics   Functional Limitation Self care   Self Care Current Status [id number]) At least 40  percent but less than 60 percent impaired, limited or restricted   Self Care Goal Status [id number]) At least 20 percent but less than 40 percent impaired, limited or restricted      Problem List Patient Active Problem List   Diagnosis Date Noted  . Blood glucose elevated 07/12/2015  . Carotid artery disease ([place]) 07/06/2015  . Subclavian arterial stenosis ([place]) 06/14/2015  . Left shoulder pain 02/14/2015  . Left knee pain 02/14/2015  . Gonalgia 02/14/2015  . Pain in shoulder 02/14/2015  . Cancer of rectum ([place]) 01/11/2015  . Malignant neoplasm of rectum ([place]) 01/11/2015  . Health care maintenance 06/13/2014  . Encounter for general adult medical examination without abnormal findings 06/13/2014  .  Neuropathy ([place]) 03/07/2014  . Urinary incontinence 03/07/2014  . Left carotid bruit 03/07/2014  . Nephrolithiasis 03/07/2014  . Calculus of kidney 03/07/2014  . Other specified symptoms and signs involving the circulatory and respiratory systems 03/07/2014  . Polyneuropathy ([place]) 03/07/2014  . Hypercholesterolemia 03/04/2014  . Essential hypertension 03/04/2014  . Colon cancer ([place]) 03/04/2014  . Essential (primary) hypertension 03/04/2014  . Malignant neoplasm of colon ([place]) 03/04/2014  . Pure hypercholesterolemia 03/04/2014  . Type 2 diabetes mellitus ([place]) 07/14/2013    Ansel Bong 08/22/2015, 3:02 PM  [place] MAIN [facility] SERVICES [address] [place], [place], [address] Phone: [phone]   Fax:  [phone]  Name: Christina Mathis MRN: [id number] Date of Birth: 1935-07-19

## 2015-08-24 ENCOUNTER — Ambulatory Visit: Payer: Medicare Other | Admitting: Occupational Therapy

## 2015-08-24 DIAGNOSIS — I890 Lymphedema, not elsewhere classified: Secondary | ICD-10-CM

## 2015-08-24 NOTE — Patient Instructions (Signed)
LE instructions and precautions as established- see initial eval.   

## 2015-08-24 NOTE — Therapy (Signed)
[place] MAIN [facility] SERVICES [address] [place], [place], [address] Phone: [phone]   Fax:  [phone]  Occupational Therapy Treatment  Patient Details  Name: Christina Mathis MRN: [id number] Date of Birth: August 18, 1935 No Data Recorded  Encounter Date: 08/24/2015      OT End of Session - 08/24/15 1403    Visit Number 11   Number of Visits 36   Date for OT Re-Evaluation 10/20/15   OT Start Time 0108   OT Stop Time 0201   OT Time Calculation (min) 53 min   Activity Tolerance Patient tolerated treatment well;No increased pain   Behavior During Therapy WFL for tasks assessed/performed      Past Medical History:  Diagnosis Date  . Colon cancer ([place])   . History of shingles    twice  . Hypercholesteremia   . Hypertension   . Left carotid bruit   . Nephrolithiasis   . Neuropathy ([place])   . Urine incontinence 03/06/2013   h/o    Past Surgical History:  Procedure Laterality Date  . APPENDECTOMY  1979   colorectal  . CESAREAN SECTION    . COLONOSCOPY WITH PROPOFOL N/A 03/21/2015   Procedure: COLONOSCOPY WITH PROPOFOL;  Surgeon: Hulen Luster, MD;  Location: [facility] ENDOSCOPY;  Service: Gastroenterology;  Laterality: N/A;  . COLOSTOMY    . surgery for colorectal cancer    . VENTRAL HERNIA REPAIR Right [id number]    There were no vitals filed for this visit.      Subjective Assessment - 08/24/15 1402    Subjective  Pt presents for OT treatment visit 11 to address LLE LE . Pt has no new complaints today.  Pt requests recommended compression stockings have closed toe if possible.   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 S/p surgery and colonostomy 03/2013. 01/2014 bilateral hernia repairs. Post chemo neuropathy B feet. Hx frequent falls last yearnever smoker.   Limitations decreased sensation B feet, falls risk, LLE pain and swelling,  decreased standing tolerance and balance, difficulty walking   Patient Stated Goals decrease swelling and keep it from getting worse so it doesnt affect my doing things   Currently in Pain? No/denies   Pain Onset More than a month ago                      OT Treatments/Exercises (OP) - 08/24/15 0001      Manual Therapy   Manual Therapy Edema management;Manual Lymphatic Drainage (MLD);Other (comment)   Manual Lymphatic Drainage (MLD) LLE/LLQ manual lymph drainage (MLD) in supine and R side lying utilizing, in addition to groin and any in tact deep abdominal lymphatics, the  ipsilateral axillo-inguinal anastamosis and deep abdominal lymphatics.  No deep strokes to abdomen due to ostomy. Sequence included stationary J strokes to complete bilateral "short neck" sequence, patient directed diaphragmatic breathing, stationary strokes to L axilla, and then dynamic strokes along axillo-inguinal pathway from proximal to distal across transverse watershed. Dynamic strokes to the thigh, knee, leg and foot from proximal to distal, and then reverse along pathways back to terminus. No fibrosis techniques today.   Compression Bandaging No LLE wraps today. Continuing trial w/ loaned Juze ccl 2 AD, sz 3 over weekend.   Other Manual Therapy skin care w/ castor oil during MLD                OT Education -  08/24/15 1403    Education provided No             OT Long Term Goals - 07/22/15 1508      OT LONG TERM GOAL #1   Title Lymphedema (LE) self-care: Pt able to apply multi layered, gradient compression wraps independently using proper techniques within 2 weeks to achieve optimal limb volume reduction.   Baseline dependent   Time 2   Period Weeks   Status New     OT LONG TERM GOAL #2   Title Lymphedema (LE) self-care:  Pt to achieve at least 10% LLE limb volume reductions during Intensive CDT to limit LE progression, decrease infection risk, to reduce pain/ discomfort, and to  improve safe ambulation and functional mobility.   Baseline dependent   Time 12   Period Weeks   Status New     OT LONG TERM GOAL #3   Title Lymphedema (LE) self-care:  Pt >/= 85 % compliant with all daily, LE self-care protocols for home program, including simple self-manual lymphatic drainage (MLD), skin care, lymphatic pumping the ex, skin care, and donning/ doffing compression wraps and garments o limit LE progression and further functional decline.     Baseline dependent   Time 12   Period Weeks   Status New     OT LONG TERM GOAL #4   Title Lymphedema (LE) self-care:  Pt able to perform all lymphedema self-care protocols independently within 12 weeks to limit progression and to decrease associated pain to improve functional performance in all domains.   Baseline dependent   Time 12   Period Weeks   Status New     OT LONG TERM GOAL #5   Title Lymphedema (LE) self-care:  During Management Phase CDT Pt to sustain limb volume reductions achieved during Intensive Phase CDT within 5% utilizing LE self-care protocols, appropriate compression garments/ devices, and needed level of caregiver assistance.   Baseline dependent   Time 6   Period Months   Status New               Plan - 08/24/15 1448    Clinical Impression Statement Pt continues to manage well w/ LE self care between visits. Pt has not yet purchased recommended compression stockings, but plans to at the first of the month when she receives retirement income. Emphasis of CDT has been to LLE, to the full leg from toes to groin. L knee is somewhat bruised after fall last weekend, but swelling is not exacerbated as a result. Cont as per POC. Condition continues to improve.      Patient will benefit from skilled therapeutic intervention in order to improve the following deficits and impairments:     Visit Diagnosis: Lymphedema, not elsewhere classified    Problem List Patient Active Problem List   Diagnosis Date  Noted  . Blood glucose elevated 07/12/2015  . Carotid artery disease ([place]) 07/06/2015  . Subclavian arterial stenosis ([place]) 06/14/2015  . Left shoulder pain 02/14/2015  . Left knee pain 02/14/2015  . Gonalgia 02/14/2015  . Pain in shoulder 02/14/2015  . Cancer of rectum ([place]) 01/11/2015  . Malignant neoplasm of rectum ([place]) 01/11/2015  . Health care maintenance 06/13/2014  . Encounter for general adult medical examination without abnormal findings 06/13/2014  . Neuropathy ([place]) 03/07/2014  . Urinary incontinence 03/07/2014  . Left carotid bruit 03/07/2014  . Nephrolithiasis 03/07/2014  . Calculus of kidney 03/07/2014  . Other specified symptoms and signs involving the circulatory and  respiratory systems 03/07/2014  . Polyneuropathy ([place]) 03/07/2014  . Hypercholesterolemia 03/04/2014  . Essential hypertension 03/04/2014  . Colon cancer ([place]) 03/04/2014  . Essential (primary) hypertension 03/04/2014  . Malignant neoplasm of colon ([place]) 03/04/2014  . Pure hypercholesterolemia 03/04/2014  . Type 2 diabetes mellitus ([place]) 07/14/2013    Ansel Bong 08/24/2015, 2:52 PM  [place] MAIN [facility] SERVICES [address] [place], [place], [address] Phone: [phone]   Fax:  [phone]  Name: Christina Mathis MRN: [id number] Date of Birth: 20-Mar-1935

## 2015-08-26 ENCOUNTER — Ambulatory Visit: Payer: Medicare Other | Admitting: Occupational Therapy

## 2015-08-26 DIAGNOSIS — I890 Lymphedema, not elsewhere classified: Secondary | ICD-10-CM

## 2015-08-26 NOTE — Therapy (Signed)
[place] MAIN [facility] SERVICES [address] [place], [place], [address] Phone: [phone]   Fax:  [phone]  Occupational Therapy Treatment  Patient Details  Name: Christina Mathis MRN: [id number] Date of Birth: 1935/11/12 No Data Recorded  Encounter Date: 08/26/2015      OT End of Session - 08/26/15 1523    Visit Number 12   Number of Visits 36   Date for OT Re-Evaluation 10/20/15   OT Start Time 0103   OT Stop Time 0215   OT Time Calculation (min) 72 min   Activity Tolerance Patient tolerated treatment well;No increased pain   Behavior During Therapy WFL for tasks assessed/performed      Past Medical History:  Diagnosis Date  . Colon cancer ([place])   . History of shingles    twice  . Hypercholesteremia   . Hypertension   . Left carotid bruit   . Nephrolithiasis   . Neuropathy ([place])   . Urine incontinence 03/06/2013   h/o    Past Surgical History:  Procedure Laterality Date  . APPENDECTOMY  1979   colorectal  . CESAREAN SECTION    . COLONOSCOPY WITH PROPOFOL N/A 03/21/2015   Procedure: COLONOSCOPY WITH PROPOFOL;  Surgeon: Hulen Luster, MD;  Location: [facility] ENDOSCOPY;  Service: Gastroenterology;  Laterality: N/A;  . COLOSTOMY    . surgery for colorectal cancer    . VENTRAL HERNIA REPAIR Right [id number]    There were no vitals filed for this visit.      Subjective Assessment - 08/26/15 1314    Subjective  (P)  Pt presents for OT treatment visit 11 to address LLE LE . Pt has no new complaints today.  Pt requests recommended compression stockings have closed toe if possible.   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 S/p surgery and colonostomy 03/2013. 01/2014 bilateral hernia repairs. Post chemo neuropathy B feet. Hx frequent falls last yearnever smoker.   Limitations (P)  decreased sensation B feet, falls risk, LLE pain  and swelling, decreased standing tolerance and balance, difficulty walking   Patient Stated Goals (P)  decrease swelling and keep it from getting worse so it doesnt affect my doing things   Pain Onset (P)  More than a month ago             LYMPHEDEMA/ONCOLOGY QUESTIONNAIRE - 08/26/15 1518      Left Lower Extremity Lymphedema   Other LLE A-D= 3042.32 ml, which is decreased by 0.88 % since last measured on 08/15/15. Overall A-D reduction measures 8.56%.   Other LLE A-G = 9512.09 ml, which is decreased by 0.88% since last measured on 08/15/15. Overall A-G reduction measures 5.73%                 OT Treatments/Exercises (OP) - 08/26/15 0001      ADLs   ADL Education Given Yes     Manual Therapy   Manual Therapy Edema management;Manual Lymphatic Drainage (MLD);Other (comment)   Edema Management LLE comparative limb volumetrics   Manual Lymphatic Drainage (MLD) LLE/LLQ manual lymph drainage (MLD) in supine and R side lying utilizing, in addition to groin and any in tact deep abdominal lymphatics, the  ipsilateral axillo-inguinal anastamosis and deep abdominal lymphatics.  No deep strokes to abdomen due to ostomy. Sequence included stationary J strokes to complete bilateral "short neck" sequence, patient directed diaphragmatic breathing, stationary  strokes to L axilla, and then dynamic strokes along axillo-inguinal pathway from proximal to distal across transverse watershed. Dynamic strokes to the thigh, knee, leg and foot from proximal to distal, and then reverse along pathways back to terminus. No fibrosis techniques today.   Compression Bandaging No LLE wraps today. Continuing trial w/ loaned Juze ccl 2 AD, sz 3 over weekend.   Other Manual Therapy skin care w/ castor oil during MLD                OT Education - 08/26/15 1517    Education provided Yes   Education Details Continued skilled Pt/caregiver Education  And LE ADL training throughout visit for lymphedema self  care, including compression wrapping, compression garment and device wear/care, lymphatic pumping ther ex, simple self-MLD, and skin care. Discussed progress towards goals.   Person(s) Educated Patient   Methods Explanation   Comprehension Verbalized understanding             OT Long Term Goals - 07/22/15 1508      OT LONG TERM GOAL #1   Title Lymphedema (LE) self-care: Pt able to apply multi layered, gradient compression wraps independently using proper techniques within 2 weeks to achieve optimal limb volume reduction.   Baseline dependent   Time 2   Period Weeks   Status New     OT LONG TERM GOAL #2   Title Lymphedema (LE) self-care:  Pt to achieve at least 10% LLE limb volume reductions during Intensive CDT to limit LE progression, decrease infection risk, to reduce pain/ discomfort, and to improve safe ambulation and functional mobility.   Baseline dependent   Time 12   Period Weeks   Status New     OT LONG TERM GOAL #3   Title Lymphedema (LE) self-care:  Pt >/= 85 % compliant with all daily, LE self-care protocols for home program, including simple self-manual lymphatic drainage (MLD), skin care, lymphatic pumping the ex, skin care, and donning/ doffing compression wraps and garments o limit LE progression and further functional decline.     Baseline dependent   Time 12   Period Weeks   Status New     OT LONG TERM GOAL #4   Title Lymphedema (LE) self-care:  Pt able to perform all lymphedema self-care protocols independently within 12 weeks to limit progression and to decrease associated pain to improve functional performance in all domains.   Baseline dependent   Time 12   Period Weeks   Status New     OT LONG TERM GOAL #5   Title Lymphedema (LE) self-care:  During Management Phase CDT Pt to sustain limb volume reductions achieved during Intensive Phase CDT within 5% utilizing LE self-care protocols, appropriate compression garments/ devices, and needed level of  caregiver assistance.   Baseline dependent   Time 6   Period Months   Status New               Plan - 08/26/15 1524    Clinical Impression Statement Pt demonstrates continued progress towards limb volume reduction goals. The Rx limb, LLE, is further decreased in volume at both knee and groin landmarks. Although Pt as not yet achieved 10% reduction goal, she is approaching it with 8.56% reduction below the knee. Overall ankle to groin reduction measures 5.73% to date.  Cont as per POC.      Patient will benefit from skilled therapeutic intervention in order to improve the following deficits and impairments:     Visit  Diagnosis: Lymphedema, not elsewhere classified    Problem List Patient Active Problem List   Diagnosis Date Noted  . Blood glucose elevated 07/12/2015  . Carotid artery disease ([place]) 07/06/2015  . Subclavian arterial stenosis ([place]) 06/14/2015  . Left shoulder pain 02/14/2015  . Left knee pain 02/14/2015  . Gonalgia 02/14/2015  . Pain in shoulder 02/14/2015  . Cancer of rectum ([place]) 01/11/2015  . Malignant neoplasm of rectum ([place]) 01/11/2015  . Health care maintenance 06/13/2014  . Encounter for general adult medical examination without abnormal findings 06/13/2014  . Neuropathy ([place]) 03/07/2014  . Urinary incontinence 03/07/2014  . Left carotid bruit 03/07/2014  . Nephrolithiasis 03/07/2014  . Calculus of kidney 03/07/2014  . Other specified symptoms and signs involving the circulatory and respiratory systems 03/07/2014  . Polyneuropathy ([place]) 03/07/2014  . Hypercholesterolemia 03/04/2014  . Essential hypertension 03/04/2014  . Colon cancer ([place]) 03/04/2014  . Essential (primary) hypertension 03/04/2014  . Malignant neoplasm of colon ([place]) 03/04/2014  . Pure hypercholesterolemia 03/04/2014  . Type 2 diabetes mellitus ([place]) 07/14/2013    Andrey Spearman, MS, OTR/L, [facility] 08/26/15 3:27 PM   [place] MAIN  [facility] SERVICES [address] [place], [place], [address] Phone: [phone]   Fax:  [phone]  Name: Christina Mathis MRN: [id number] Date of Birth: Jan 21, 1936

## 2015-08-26 NOTE — Patient Instructions (Signed)
LE instructions and precautions as established- see initial eval.   

## 2015-08-29 ENCOUNTER — Ambulatory Visit: Payer: Medicare Other | Admitting: Occupational Therapy

## 2015-08-29 DIAGNOSIS — I890 Lymphedema, not elsewhere classified: Secondary | ICD-10-CM | POA: Diagnosis not present

## 2015-08-29 NOTE — Therapy (Signed)
[place] MAIN [facility] SERVICES [address] [place], [place], [address] Phone: [phone]   Fax:  [phone]  Occupational Therapy Treatment  Patient Details  Name: Christina Mathis MRN: [id number] Date of Birth: October 14, 1935 No Data Recorded  Encounter Date: 08/29/2015      OT End of Session - 08/29/15 1511    Visit Number 13   Number of Visits 36   Date for OT Re-Evaluation 10/20/15   OT Start Time 0107   OT Stop Time 0206   OT Time Calculation (min) 59 min   Activity Tolerance Patient tolerated treatment well;No increased pain   Behavior During Therapy WFL for tasks assessed/performed      Past Medical History:  Diagnosis Date  . Colon cancer ([place])   . History of shingles    twice  . Hypercholesteremia   . Hypertension   . Left carotid bruit   . Nephrolithiasis   . Neuropathy ([place])   . Urine incontinence 03/06/2013   h/o    Past Surgical History:  Procedure Laterality Date  . APPENDECTOMY  1979   colorectal  . CESAREAN SECTION    . COLONOSCOPY WITH PROPOFOL N/A 03/21/2015   Procedure: COLONOSCOPY WITH PROPOFOL;  Surgeon: Hulen Luster, MD;  Location: [facility] ENDOSCOPY;  Service: Gastroenterology;  Laterality: N/A;  . COLOSTOMY    . surgery for colorectal cancer    . VENTRAL HERNIA REPAIR Right [id number]    There were no vitals filed for this visit.      Subjective Assessment - 08/29/15 1323    Subjective  Pt presents for OT treatment visit 13to address LLE LE . Pt has no new complaints today.  Pt presents w/ compression garments in place.   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 S/p surgery and colonostomy 03/2013. 01/2014 bilateral hernia repairs. Post chemo neuropathy B feet. Hx frequent falls last yearnever smoker.   Limitations decreased sensation B feet, falls risk, LLE pain and swelling, decreased standing tolerance and  balance, difficulty walking   Patient Stated Goals decrease swelling and keep it from getting worse so it doesnt affect my doing things   Pain Onset More than a month ago                      OT Treatments/Exercises (OP) - 08/29/15 0001      ADLs   ADL Education Given Yes     Manual Therapy   Manual Therapy Edema management;Manual Lymphatic Drainage (MLD);Other (comment)   Manual Lymphatic Drainage (MLD) LLE/LLQ manual lymph drainage (MLD) in supine and R side lying utilizing, in addition to groin and any in tact deep abdominal lymphatics, the  ipsilateral axillo-inguinal anastamosis and deep abdominal lymphatics.  No deep strokes to abdomen due to ostomy. Sequence included stationary J strokes to complete bilateral "short neck" sequence, patient directed diaphragmatic breathing, stationary strokes to L axilla, and then dynamic strokes along axillo-inguinal pathway from proximal to distal across transverse watershed. Dynamic strokes to the thigh, knee, leg and foot from proximal to distal, and then reverse along pathways back to terminus. No fibrosis techniques today.   Compression Bandaging No LLE wraps today. Continuing trial w/ loaned Juze ccl 2 AD, sz 3 over weekend.   Other Manual Therapy skin care w/ castor oil during MLD                OT  Education - 08/29/15 1509    Education provided Yes   Education Details Continued skilled Pt/caregiver Education  And LE ADL training throughout visit for lymphedema self care, including compression wrapping, compression garment and device wear/care, lymphatic pumping ther ex, simple self-MLD, and skin care. Discussed progress towards goals.   Person(s) Educated Patient   Methods Explanation   Comprehension Verbalized understanding             OT Long Term Goals - 07/22/15 1508      OT LONG TERM GOAL #1   Title Lymphedema (LE) self-care: Pt able to apply multi layered, gradient compression wraps independently using  proper techniques within 2 weeks to achieve optimal limb volume reduction.   Baseline dependent   Time 2   Period Weeks   Status New     OT LONG TERM GOAL #2   Title Lymphedema (LE) self-care:  Pt to achieve at least 10% LLE limb volume reductions during Intensive CDT to limit LE progression, decrease infection risk, to reduce pain/ discomfort, and to improve safe ambulation and functional mobility.   Baseline dependent   Time 12   Period Weeks   Status New     OT LONG TERM GOAL #3   Title Lymphedema (LE) self-care:  Pt >/= 85 % compliant with all daily, LE self-care protocols for home program, including simple self-manual lymphatic drainage (MLD), skin care, lymphatic pumping the ex, skin care, and donning/ doffing compression wraps and garments o limit LE progression and further functional decline.     Baseline dependent   Time 12   Period Weeks   Status New     OT LONG TERM GOAL #4   Title Lymphedema (LE) self-care:  Pt able to perform all lymphedema self-care protocols independently within 12 weeks to limit progression and to decrease associated pain to improve functional performance in all domains.   Baseline dependent   Time 12   Period Weeks   Status New     OT LONG TERM GOAL #5   Title Lymphedema (LE) self-care:  During Management Phase CDT Pt to sustain limb volume reductions achieved during Intensive Phase CDT within 5% utilizing LE self-care protocols, appropriate compression garments/ devices, and needed level of caregiver assistance.   Baseline dependent   Time 6   Period Months   Status New               Plan - 08/29/15 1513    Clinical Impression Statement Pt tolerating CDT without difficulty. LLE remains somewhat spongey, despite volume reduction. This is largely fibrosis      resulting from long term swelling below the knee. and it is doubtful that this will change significantly at this stage of progression. Pt is approaching clinical plateay  for progress  and is ready for garment fitting. Pt verbalized understanding of plan to decrease OT frequency once garments are fit. OT provided internet resource for garments today where compression garments are on sale.      Patient will benefit from skilled therapeutic intervention in order to improve the following deficits and impairments:     Visit Diagnosis: Lymphedema, not elsewhere classified    Problem List Patient Active Problem List   Diagnosis Date Noted  . Blood glucose elevated 07/12/2015  . Carotid artery disease ([place]) 07/06/2015  . Subclavian arterial stenosis ([place]) 06/14/2015  . Left shoulder pain 02/14/2015  . Left knee pain 02/14/2015  . Gonalgia 02/14/2015  . Pain in shoulder 02/14/2015  . Cancer of  rectum ([place]) 01/11/2015  . Malignant neoplasm of rectum ([place]) 01/11/2015  . Health care maintenance 06/13/2014  . Encounter for general adult medical examination without abnormal findings 06/13/2014  . Neuropathy ([place]) 03/07/2014  . Urinary incontinence 03/07/2014  . Left carotid bruit 03/07/2014  . Nephrolithiasis 03/07/2014  . Calculus of kidney 03/07/2014  . Other specified symptoms and signs involving the circulatory and respiratory systems 03/07/2014  . Polyneuropathy ([place]) 03/07/2014  . Hypercholesterolemia 03/04/2014  . Essential hypertension 03/04/2014  . Colon cancer ([place]) 03/04/2014  . Essential (primary) hypertension 03/04/2014  . Malignant neoplasm of colon ([place]) 03/04/2014  . Pure hypercholesterolemia 03/04/2014  . Type 2 diabetes mellitus ([place]) 07/14/2013    Andrey Spearman, MS, OTR/L, [facility] 08/29/15 3:18 PM  [place] MAIN [facility] SERVICES [address] [place], [place], [address] Phone: [phone]   Fax:  [phone]  Name: Kahliah Axelrod MRN: [id number] Date of Birth: [birth year]

## 2015-08-31 ENCOUNTER — Ambulatory Visit: Payer: Medicare Other | Attending: Internal Medicine | Admitting: Occupational Therapy

## 2015-08-31 DIAGNOSIS — I890 Lymphedema, not elsewhere classified: Secondary | ICD-10-CM | POA: Diagnosis not present

## 2015-08-31 NOTE — Therapy (Signed)
[place] MAIN [facility] SERVICES [address] [place], [place], [address] Phone: [phone]   Fax:  [phone]  Occupational Therapy Treatment  Patient Details  Name: Christina Mathis MRN: [id number] Date of Birth: 1935/07/21 No Data Recorded  Encounter Date: 08/31/2015      OT End of Session - 08/31/15 1641    Visit Number 14   Number of Visits 36   Date for OT Re-Evaluation 10/20/15   OT Start Time 0335   OT Stop Time 0449   OT Time Calculation (min) 74 min   Activity Tolerance Patient tolerated treatment well;No increased pain   Behavior During Therapy WFL for tasks assessed/performed      Past Medical History:  Diagnosis Date  . Colon cancer ([place])   . History of shingles    twice  . Hypercholesteremia   . Hypertension   . Left carotid bruit   . Nephrolithiasis   . Neuropathy ([place])   . Urine incontinence 03/06/2013   h/o    Past Surgical History:  Procedure Laterality Date  . APPENDECTOMY  1979   colorectal  . CESAREAN SECTION    . COLONOSCOPY WITH PROPOFOL N/A 03/21/2015   Procedure: COLONOSCOPY WITH PROPOFOL;  Surgeon: Hulen Luster, MD;  Location: [facility] ENDOSCOPY;  Service: Gastroenterology;  Laterality: N/A;  . COLOSTOMY    . surgery for colorectal cancer    . VENTRAL HERNIA REPAIR Right [id number]    There were no vitals filed for this visit.      Subjective Assessment - 08/31/15 1555    Subjective  Pt presents for OT treatment visit 14 to address LLE LE . Pt requests review of compression garment recommendations before she orders.   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 S/p surgery and colonostomy 03/2013. 01/2014 bilateral hernia repairs. Post chemo neuropathy B feet. Hx frequent falls last yearnever smoker.   Limitations decreased sensation B feet, falls risk, LLE pain and swelling, decreased standing tolerance and  balance, difficulty walking   Patient Stated Goals decrease swelling and keep it from getting worse so it doesnt affect my doing things   Pain Onset More than a month ago                      OT Treatments/Exercises (OP) - 08/31/15 0001      ADLs   ADL Education Given Yes     Manual Therapy   Manual Therapy Edema management;Manual Lymphatic Drainage (MLD);Other (comment)   Manual Lymphatic Drainage (MLD) LLE/LLQ manual lymph drainage (MLD) in supine and R side lying utilizing, in addition to groin and any in tact deep abdominal lymphatics, the  ipsilateral axillo-inguinal anastamosis and deep abdominal lymphatics.  No deep strokes to abdomen due to ostomy. Sequence included stationary J strokes to complete bilateral "short neck" sequence, patient directed diaphragmatic breathing, stationary strokes to L axilla, and then dynamic strokes along axillo-inguinal pathway from proximal to distal across transverse watershed. Dynamic strokes to the thigh, knee, leg and foot from proximal to distal, and then reverse along pathways back to terminus. No fibrosis techniques today.   Compression Bandaging No LLE wraps today. Continuing trial w/ loaned Juze ccl 2 AD, sz 3 over weekend.   Other Manual Therapy skin care w/ castor oil during MLD                OT Education - 08/31/15  1640    Education provided Yes   Education Details Reviewed LE precautions and cellulitis precautions   Person(s) Educated Patient   Methods Explanation;Demonstration;Handout   Comprehension Verbalized understanding             OT Long Term Goals - 07/22/15 1508      OT LONG TERM GOAL #1   Title Lymphedema (LE) self-care: Pt able to apply multi layered, gradient compression wraps independently using proper techniques within 2 weeks to achieve optimal limb volume reduction.   Baseline dependent   Time 2   Period Weeks   Status New     OT LONG TERM GOAL #2   Title Lymphedema (LE) self-care:   Pt to achieve at least 10% LLE limb volume reductions during Intensive CDT to limit LE progression, decrease infection risk, to reduce pain/ discomfort, and to improve safe ambulation and functional mobility.   Baseline dependent   Time 12   Period Weeks   Status New     OT LONG TERM GOAL #3   Title Lymphedema (LE) self-care:  Pt >/= 85 % compliant with all daily, LE self-care protocols for home program, including simple self-manual lymphatic drainage (MLD), skin care, lymphatic pumping the ex, skin care, and donning/ doffing compression wraps and garments o limit LE progression and further functional decline.     Baseline dependent   Time 12   Period Weeks   Status New     OT LONG TERM GOAL #4   Title Lymphedema (LE) self-care:  Pt able to perform all lymphedema self-care protocols independently within 12 weeks to limit progression and to decrease associated pain to improve functional performance in all domains.   Baseline dependent   Time 12   Period Weeks   Status New     OT LONG TERM GOAL #5   Title Lymphedema (LE) self-care:  During Management Phase CDT Pt to sustain limb volume reductions achieved during Intensive Phase CDT within 5% utilizing LE self-care protocols, appropriate compression garments/ devices, and needed level of caregiver assistance.   Baseline dependent   Time 6   Period Months   Status New               Plan - 08/31/15 1642    Clinical Impression Statement Pt verbalizes understanding of LE precautions and cellulitis signs and symptoms by end of skilled teaching and review. Limb volume is decreased sufficiently in LLE to fit w/ garments. Pt to order before next visit.      Patient will benefit from skilled therapeutic intervention in order to improve the following deficits and impairments:     Visit Diagnosis: Lymphedema, not elsewhere classified    Problem List Patient Active Problem List   Diagnosis Date Noted  . Blood glucose elevated  07/12/2015  . Carotid artery disease ([place]) 07/06/2015  . Subclavian arterial stenosis ([place]) 06/14/2015  . Left shoulder pain 02/14/2015  . Left knee pain 02/14/2015  . Gonalgia 02/14/2015  . Pain in shoulder 02/14/2015  . Cancer of rectum ([place]) 01/11/2015  . Malignant neoplasm of rectum ([place]) 01/11/2015  . Health care maintenance 06/13/2014  . Encounter for general adult medical examination without abnormal findings 06/13/2014  . Neuropathy ([place]) 03/07/2014  . Urinary incontinence 03/07/2014  . Left carotid bruit 03/07/2014  . Nephrolithiasis 03/07/2014  . Calculus of kidney 03/07/2014  . Other specified symptoms and signs involving the circulatory and respiratory systems 03/07/2014  . Polyneuropathy ([place]) 03/07/2014  . Hypercholesterolemia 03/04/2014  . Essential  hypertension 03/04/2014  . Colon cancer ([place]) 03/04/2014  . Essential (primary) hypertension 03/04/2014  . Malignant neoplasm of colon ([place]) 03/04/2014  . Pure hypercholesterolemia 03/04/2014  . Type 2 diabetes mellitus ([place]) 07/14/2013   Andrey Spearman, MS, OTR/L, [facility] 08/31/15 4:59 PM   [place] MAIN [facility] SERVICES [address] [place], [place], [address] Phone: [phone]   Fax:  [phone]  Name: Christina Mathis MRN: [id number] Date of Birth: Apr 25, 1935

## 2015-09-02 ENCOUNTER — Ambulatory Visit: Payer: Medicare Other | Admitting: Occupational Therapy

## 2015-09-02 DIAGNOSIS — I890 Lymphedema, not elsewhere classified: Secondary | ICD-10-CM

## 2015-09-02 NOTE — Therapy (Signed)
[place] MAIN [facility] SERVICES [address] [place], [place], [address] Phone: [phone]   Fax:  [phone]  Occupational Therapy Treatment  Patient Details  Name: Christina Mathis MRN: [id number] Date of Birth: 1935-06-13 No Data Recorded  Encounter Date: 09/02/2015      OT End of Session - 09/02/15 1012    Visit Number 15   Number of Visits 36   Date for OT Re-Evaluation 10/20/15   OT Start Time 0905   OT Stop Time 1008   OT Time Calculation (min) 63 min      Past Medical History:  Diagnosis Date  . Colon cancer ([place])   . History of shingles    twice  . Hypercholesteremia   . Hypertension   . Left carotid bruit   . Nephrolithiasis   . Neuropathy ([place])   . Urine incontinence 03/06/2013   h/o    Past Surgical History:  Procedure Laterality Date  . APPENDECTOMY  1979   colorectal  . CESAREAN SECTION    . COLONOSCOPY WITH PROPOFOL N/A 03/21/2015   Procedure: COLONOSCOPY WITH PROPOFOL;  Surgeon: Hulen Luster, MD;  Location: [facility] ENDOSCOPY;  Service: Gastroenterology;  Laterality: N/A;  . COLOSTOMY    . surgery for colorectal cancer    . VENTRAL HERNIA REPAIR Right [id number]    There were no vitals filed for this visit.      Subjective Assessment - 09/02/15 1011    Subjective  Pt presents for OT treatment visit 15 to address LLE LE . Pt reports that she ordered recommended compression garments yesterday after her appointment. She expects them to arrive next week. We discussed plan to schedule both 1 week and 1 month follow ups after garments are fitted., then DC from OT for LE care is volumes are stable. Pt in agreement with that plan.   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 S/p surgery and colonostomy 03/2013. 01/2014 bilateral hernia repairs. Post chemo neuropathy B feet. Hx frequent falls last yearnever smoker.   Limitations  decreased sensation B feet, falls risk, LLE pain and swelling, decreased standing tolerance and balance, difficulty walking   Patient Stated Goals decrease swelling and keep it from getting worse so it doesnt affect my doing things   Pain Onset More than a month ago                      OT Treatments/Exercises (OP) - 09/02/15 0001      Manual Therapy   Manual Therapy Edema management;Manual Lymphatic Drainage (MLD);Other (comment)   Manual Lymphatic Drainage (MLD) LLE/LLQ manual lymph drainage (MLD) in supine and R side lying utilizing, in addition to groin and any in tact deep abdominal lymphatics, the  ipsilateral axillo-inguinal anastamosis and deep abdominal lymphatics.  No deep strokes to abdomen due to ostomy. Sequence included stationary J strokes to complete bilateral "short neck" sequence, patient directed diaphragmatic breathing, stationary strokes to L axilla, and then dynamic strokes along axillo-inguinal pathway from proximal to distal across transverse watershed. Dynamic strokes to the thigh, knee, leg and foot from proximal to distal, and then reverse along pathways back to terminus. No fibrosis techniques today.   Compression Bandaging No LLE wraps today. Continuing trial w/ loaned Juze ccl 2 AD, sz 3 over weekend.   Other Manual Therapy skin care w/ castor oil during MLD  OT Education - 09/02/15 1015    Education provided Yes   Education Details Reviewed POC and progress towards goals.   Methods Explanation   Comprehension Verbalized understanding             OT Long Term Goals - 07/22/15 1508      OT LONG TERM GOAL #1   Title Lymphedema (LE) self-care: Pt able to apply multi layered, gradient compression wraps independently using proper techniques within 2 weeks to achieve optimal limb volume reduction.   Baseline dependent   Time 2   Period Weeks   Status New     OT LONG TERM GOAL #2   Title Lymphedema (LE) self-care:  Pt  to achieve at least 10% LLE limb volume reductions during Intensive CDT to limit LE progression, decrease infection risk, to reduce pain/ discomfort, and to improve safe ambulation and functional mobility.   Baseline dependent   Time 12   Period Weeks   Status New     OT LONG TERM GOAL #3   Title Lymphedema (LE) self-care:  Pt >/= 85 % compliant with all daily, LE self-care protocols for home program, including simple self-manual lymphatic drainage (MLD), skin care, lymphatic pumping the ex, skin care, and donning/ doffing compression wraps and garments o limit LE progression and further functional decline.     Baseline dependent   Time 12   Period Weeks   Status New     OT LONG TERM GOAL #4   Title Lymphedema (LE) self-care:  Pt able to perform all lymphedema self-care protocols independently within 12 weeks to limit progression and to decrease associated pain to improve functional performance in all domains.   Baseline dependent   Time 12   Period Weeks   Status New     OT LONG TERM GOAL #5   Title Lymphedema (LE) self-care:  During Management Phase CDT Pt to sustain limb volume reductions achieved during Intensive Phase CDT within 5% utilizing LE self-care protocols, appropriate compression garments/ devices, and needed level of caregiver assistance.   Baseline dependent   Time 6   Period Months   Status New               Plan - 09/02/15 1015    Rehab Potential Good   Clinical Impairments Affecting Rehab Potential Pt continues to make progress towards OT goals. Limb swelling continues to reduce, although Pt is approaching clinical plateau. Skin is well hydrated with slight lightening of hemociderine stain since commencing OT. Pt has had no falls since commencing OT and she is diligent w/ LE self care.   OT Frequency 3x / week   OT Duration 12 weeks   OT Treatment/Interventions Self-care/ADL training;DME and/or AE instruction;Manual lymph drainage;Patient/family  education;Compression bandaging;Therapeutic exercises;Therapeutic activities;Manual Therapy      Patient will benefit from skilled therapeutic intervention in order to improve the following deficits and impairments:  Decreased knowledge of use of DME, Decreased skin integrity, Increased edema, Impaired flexibility, Pain, Decreased mobility, Impaired sensation, Decreased activity tolerance, Decreased range of motion, Decreased balance, Decreased knowledge of precautions, Difficulty walking  Visit Diagnosis: Lymphedema, not elsewhere classified    Problem List Patient Active Problem List   Diagnosis Date Noted  . Blood glucose elevated 07/12/2015  . Carotid artery disease ([place]) 07/06/2015  . Subclavian arterial stenosis ([place]) 06/14/2015  . Left shoulder pain 02/14/2015  . Left knee pain 02/14/2015  . Gonalgia 02/14/2015  . Pain in shoulder 02/14/2015  . Cancer of rectum ([place])  01/11/2015  . Malignant neoplasm of rectum ([place]) 01/11/2015  . Health care maintenance 06/13/2014  . Encounter for general adult medical examination without abnormal findings 06/13/2014  . Neuropathy ([place]) 03/07/2014  . Urinary incontinence 03/07/2014  . Left carotid bruit 03/07/2014  . Nephrolithiasis 03/07/2014  . Calculus of kidney 03/07/2014  . Other specified symptoms and signs involving the circulatory and respiratory systems 03/07/2014  . Polyneuropathy ([place]) 03/07/2014  . Hypercholesterolemia 03/04/2014  . Essential hypertension 03/04/2014  . Colon cancer ([place]) 03/04/2014  . Essential (primary) hypertension 03/04/2014  . Malignant neoplasm of colon ([place]) 03/04/2014  . Pure hypercholesterolemia 03/04/2014  . Type 2 diabetes mellitus ([place]) 07/14/2013    Andrey Spearman, MS, OTR/L, [facility] 09/02/15 10:19 AM   [place] MAIN [facility] SERVICES [address] [place], [place], [address] Phone: [phone]   Fax:  [phone]  Name: Christina Mathis MRN:  [id number] Date of Birth: 16-Jan-1936

## 2015-09-05 ENCOUNTER — Ambulatory Visit: Payer: Medicare Other | Admitting: Occupational Therapy

## 2015-09-05 DIAGNOSIS — Z452 Encounter for adjustment and management of vascular access device: Secondary | ICD-10-CM | POA: Diagnosis not present

## 2015-09-07 ENCOUNTER — Ambulatory Visit: Payer: Medicare Other | Admitting: Occupational Therapy

## 2015-09-07 DIAGNOSIS — I890 Lymphedema, not elsewhere classified: Secondary | ICD-10-CM

## 2015-09-07 NOTE — Patient Instructions (Signed)

## 2015-09-07 NOTE — Therapy (Signed)
[place] MAIN [facility] SERVICES [address] [place], [place], [address] Phone: [phone]   Fax:  [phone]  Occupational Therapy Treatment  Patient Details  Name: Christina Mathis MRN: [id number] Date of Birth: [birth year] No Data Recorded  Encounter Date: 09/07/2015      OT End of Session - 09/07/15 1053    Visit Number 16   Number of Visits 36   Date for OT Re-Evaluation 10/20/15   OT Start Time 0908   OT Stop Time 1007   OT Time Calculation (min) 59 min      Past Medical History:  Diagnosis Date  . Colon cancer ([place])   . History of shingles    twice  . Hypercholesteremia   . Hypertension   . Left carotid bruit   . Nephrolithiasis   . Neuropathy ([place])   . Urine incontinence 03/06/2013   h/o    Past Surgical History:  Procedure Laterality Date  . APPENDECTOMY  1979   colorectal  . CESAREAN SECTION    . COLONOSCOPY WITH PROPOFOL N/A 03/21/2015   Procedure: COLONOSCOPY WITH PROPOFOL;  Surgeon: Hulen Luster, MD;  Location: [facility] ENDOSCOPY;  Service: Gastroenterology;  Laterality: N/A;  . COLOSTOMY    . surgery for colorectal cancer    . VENTRAL HERNIA REPAIR Right [id number]    There were no vitals filed for this visit.      Subjective Assessment - 09/07/15 1044    Subjective  Pt presents for OT treatment visit 16 to address LLE LE . Pt states she missed last scheduled OT session due to pianful pulled groin muscle. Pt is walking slowly and guarding movement today during transfers.   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 S/p surgery and colonostomy 03/2013. 01/2014 bilateral hernia repairs. Post chemo neuropathy B feet. Hx frequent falls last yearnever smoker.   Limitations decreased sensation B feet, falls risk, LLE pain and swelling, decreased standing tolerance and balance, difficulty walking   Patient Stated Goals decrease  swelling and keep it from getting worse so it doesnt affect my doing things   Currently in Pain? Yes  R groin and L shoulder. Not numerically rated today.   Pain Onset In the past 7 days   Pain Frequency Other (Comment)  with weight shifting during transfers, bed mobility, ambulation                       OT Treatments/Exercises (OP) - 09/07/15 0001      ADLs   ADL Education Given Yes     Manual Therapy   Manual Therapy Edema management;Manual Lymphatic Drainage (MLD);Other (comment)   Manual Lymphatic Drainage (MLD) LLE/LLQ manual lymph drainage (MLD) in supine and R side lying utilizing, in addition to groin and any in tact deep abdominal lymphatics, the  ipsilateral axillo-inguinal anastamosis and deep abdominal lymphatics.  No deep strokes to abdomen due to ostomy. Sequence included stationary J strokes to complete bilateral "short neck" sequence, patient directed diaphragmatic breathing, stationary strokes to L axilla, and then dynamic strokes along axillo-inguinal pathway from proximal to distal across transverse watershed. Dynamic strokes to the thigh, knee, leg and foot from proximal to distal, and then reverse along pathways back to terminus. No fibrosis techniques today.   Compression Bandaging No LLE wraps today. Continuing trial w/ loaned Juze ccl 2 AD, sz 3 over weekend.   Other Manual  Therapy skin care w/ castor oil during MLD                OT Education - 09/07/15 1048    Education provided Yes   Education Details Skilled Pt edu re benefits of PT screening to assess any strength and balance limitations that may be affecting increased falls risk and frequent muscle pulls/ injuries over last several months. Pt educated that functional decline and decreased endurance are often common s/p cancer treatment. Also provided Pt edu re correctiong postural alignment to check forward head and center of gravity, which frequently contributes to increased falls risk. Pt  able to correct posture w/ cues .    Person(s) Educated Patient   Methods Explanation;Demonstration;Tactile cues;Verbal cues   Comprehension Returned demonstration;Verbal cues required;Tactile cues required;Need further instruction             OT Long Term Goals - 07/22/15 1508      OT LONG TERM GOAL #1   Title Lymphedema (LE) self-care: Pt able to apply multi layered, gradient compression wraps independently using proper techniques within 2 weeks to achieve optimal limb volume reduction.   Baseline dependent   Time 2   Period Weeks   Status New     OT LONG TERM GOAL #2   Title Lymphedema (LE) self-care:  Pt to achieve at least 10% LLE limb volume reductions during Intensive CDT to limit LE progression, decrease infection risk, to reduce pain/ discomfort, and to improve safe ambulation and functional mobility.   Baseline dependent   Time 12   Period Weeks   Status New     OT LONG TERM GOAL #3   Title Lymphedema (LE) self-care:  Pt >/= 85 % compliant with all daily, LE self-care protocols for home program, including simple self-manual lymphatic drainage (MLD), skin care, lymphatic pumping the ex, skin care, and donning/ doffing compression wraps and garments o limit LE progression and further functional decline.     Baseline dependent   Time 12   Period Weeks   Status New     OT LONG TERM GOAL #4   Title Lymphedema (LE) self-care:  Pt able to perform all lymphedema self-care protocols independently within 12 weeks to limit progression and to decrease associated pain to improve functional performance in all domains.   Baseline dependent   Time 12   Period Weeks   Status New     OT LONG TERM GOAL #5   Title Lymphedema (LE) self-care:  During Management Phase CDT Pt to sustain limb volume reductions achieved during Intensive Phase CDT within 5% utilizing LE self-care protocols, appropriate compression garments/ devices, and needed level of caregiver assistance.   Baseline  dependent   Time 6   Period Months   Status New             Patient will benefit from skilled therapeutic intervention in order to improve the following deficits and impairments:     Visit Diagnosis: Lymphedema, not elsewhere classified    Problem List Patient Active Problem List   Diagnosis Date Noted  . Blood glucose elevated 07/12/2015  . Carotid artery disease ([place]) 07/06/2015  . Subclavian arterial stenosis ([place]) 06/14/2015  . Left shoulder pain 02/14/2015  . Left knee pain 02/14/2015  . Gonalgia 02/14/2015  . Pain in shoulder 02/14/2015  . Cancer of rectum ([place]) 01/11/2015  . Malignant neoplasm of rectum ([place]) 01/11/2015  . Health care maintenance 06/13/2014  . Encounter for general adult medical examination without abnormal  findings 06/13/2014  . Neuropathy ([place]) 03/07/2014  . Urinary incontinence 03/07/2014  . Left carotid bruit 03/07/2014  . Nephrolithiasis 03/07/2014  . Calculus of kidney 03/07/2014  . Other specified symptoms and signs involving the circulatory and respiratory systems 03/07/2014  . Polyneuropathy ([place]) 03/07/2014  . Hypercholesterolemia 03/04/2014  . Essential hypertension 03/04/2014  . Colon cancer ([place]) 03/04/2014  . Essential (primary) hypertension 03/04/2014  . Malignant neoplasm of colon ([place]) 03/04/2014  . Pure hypercholesterolemia 03/04/2014  . Type 2 diabetes mellitus ([place]) 07/14/2013    Andrey Spearman, MS, OTR/L, [facility] 09/07/15 11:01 AM  [place] MAIN [facility] SERVICES [address] [place], [place], [address] Phone: [phone]   Fax:  [phone]  Name: Christina Mathis MRN: [id number] Date of Birth: 02/08/1935

## 2015-09-09 ENCOUNTER — Ambulatory Visit: Payer: Medicare Other | Admitting: Occupational Therapy

## 2015-09-09 ENCOUNTER — Encounter: Payer: Self-pay | Admitting: Internal Medicine

## 2015-09-09 DIAGNOSIS — I890 Lymphedema, not elsewhere classified: Secondary | ICD-10-CM

## 2015-09-09 NOTE — Therapy (Signed)
[place] MAIN [facility] SERVICES [address] [place], [place], [address] Phone: [phone]   Fax:  [phone]  Occupational Therapy Treatment  Patient Details  Name: Christina Mathis MRN: [id number] Date of Birth: July 03, 1935 No Data Recorded  Encounter Date: 09/09/2015      OT End of Session - 09/09/15 1421    Visit Number 17   Number of Visits 36   Date for OT Re-Evaluation 10/20/15   OT Start Time 0105   OT Stop Time 0210   OT Time Calculation (min) 65 min      Past Medical History:  Diagnosis Date  . Colon cancer ([place])   . History of shingles    twice  . Hypercholesteremia   . Hypertension   . Left carotid bruit   . Nephrolithiasis   . Neuropathy ([place])   . Urine incontinence 03/06/2013   h/o    Past Surgical History:  Procedure Laterality Date  . APPENDECTOMY  1979   colorectal  . CESAREAN SECTION    . COLONOSCOPY WITH PROPOFOL N/A 03/21/2015   Procedure: COLONOSCOPY WITH PROPOFOL;  Surgeon: Hulen Luster, MD;  Location: [facility] ENDOSCOPY;  Service: Gastroenterology;  Laterality: N/A;  . COLOSTOMY    . surgery for colorectal cancer    . VENTRAL HERNIA REPAIR Right [id number]    There were no vitals filed for this visit.      Subjective Assessment - 09/09/15 1401    Subjective  Pt presents for OT treatment visit 17 to address BLE LE . Pt is feeling better today with less groin pain.    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 S/p surgery and colonostomy 03/2013. 01/2014 bilateral hernia repairs. Post chemo neuropathy B feet. Hx frequent falls last yearnever smoker.   Limitations decreased sensation B feet, falls risk, LLE pain and swelling, decreased standing tolerance and balance, difficulty walking   Patient Stated Goals decrease swelling and keep it from getting worse so it doesnt affect my doing things   Currently in Pain? Yes    Pain Onset In the past 7 days             LYMPHEDEMA/ONCOLOGY QUESTIONNAIRE - 09/09/15 1412      Right Lower Extremity Lymphedema   Other RLE Limb volume from Ankle to below knee (A-D) =2876.45 ml.    Other RLE Ankle to groin (A_G) = 9421.10 ml   Other RLE A-D is decreased by 3.65% since commencing OT for CDT on 07/29/15. RLE A-G volume is decreased by 6.54% overall. LVD for A-D = 4.33%, R>L, and 14.13%, R>L for A-G.     Left Lower Extremity Lymphedema   Other LLE A-D volume = 2756.96 ml   Other LLE A-G = 8254.94 ml.     Other LLE A-D is decreased by 9.37% since last measured on 7/28. It is decreased since initally measured on 6/30 by 17.13%. LLE A-G is decreased by 13.22% since 7/28, and decreased by 18.195% overall                 OT Treatments/Exercises (OP) - 09/09/15 0001      ADLs   ADL Education Given Yes     Manual Therapy   Manual Therapy Edema management;Other (comment)   Edema Management BLE comparative volumetrics                OT Education - 09/09/15 1420  Education provided Yes   Education Details Reviewed results of BLE comparative limb volumetrics and interpreted results in regard to progress towards limb volume reduction goals. Reviewed importance of ongoing long term LE self management to retain clinical and functional gains/.   Person(s) Educated Patient   Methods Explanation   Comprehension Verbalized understanding             OT Long Term Goals - 07/22/15 1508      OT LONG TERM GOAL #1   Title Lymphedema (LE) self-care: Pt able to apply multi layered, gradient compression wraps independently using proper techniques within 2 weeks to achieve optimal limb volume reduction.   Baseline dependent   Time 2   Period Weeks   Status New     OT LONG TERM GOAL #2   Title Lymphedema (LE) self-care:  Pt to achieve at least 10% LLE limb volume reductions during Intensive CDT to limit LE progression, decrease infection risk, to  reduce pain/ discomfort, and to improve safe ambulation and functional mobility.   Baseline dependent   Time 12   Period Weeks   Status New     OT LONG TERM GOAL #3   Title Lymphedema (LE) self-care:  Pt >/= 85 % compliant with all daily, LE self-care protocols for home program, including simple self-manual lymphatic drainage (MLD), skin care, lymphatic pumping the ex, skin care, and donning/ doffing compression wraps and garments o limit LE progression and further functional decline.     Baseline dependent   Time 12   Period Weeks   Status New     OT LONG TERM GOAL #4   Title Lymphedema (LE) self-care:  Pt able to perform all lymphedema self-care protocols independently within 12 weeks to limit progression and to decrease associated pain to improve functional performance in all domains.   Baseline dependent   Time 12   Period Weeks   Status New     OT LONG TERM GOAL #5   Title Lymphedema (LE) self-care:  During Management Phase CDT Pt to sustain limb volume reductions achieved during Intensive Phase CDT within 5% utilizing LE self-care protocols, appropriate compression garments/ devices, and needed level of caregiver assistance.   Baseline dependent   Time 6   Period Months   Status New               Plan - 09/09/15 1425    Clinical Impression Statement BLE limb volumetrics reveal significant volume reductions in LLE Rx leg and also notable reductions in RLE, the non Rx limb. LLE (Rx) is decreased by 17.13% below the knee (A-D) since commencing OT for CDT on 6/30. The ankle to groin (A-G) volume is decreased by 18.19% overall. Both values meet and exceed the initial 10% goal for limb volume reductions. The non-Rx , dominant RLE is decreased by 3.65% A-D, and by 6.54% for A-G. Limb volume differential (LVD) at both landmarks reveal 4.33% R.L below the knee, and 14.13% R>L for full leg. Essentially non Rx RLL responded to ymphatic stimulation  in deep abdominals and LLQ MLD,  which is a secondary gain. Compression garments are on order. We'll fit them next week hopefully than move in to Management Phase surveilance.   Rehab Potential Good   Clinical Impairments Affecting Rehab Potential Pt continues to make progress towards OT goals. Limb swelling continues to reduce, although Pt is approaching clinical plateau. Skin is well hydrated with slight lightening of hemociderine stain since commencing OT. Pt has had no falls  since commencing OT and she is diligent w/ LE self care.   OT Frequency 3x / week   OT Duration 12 weeks   OT Treatment/Interventions Self-care/ADL training;DME and/or AE instruction;Manual lymph drainage;Patient/family education;Compression bandaging;Therapeutic exercises;Therapeutic activities;Manual Therapy      Patient will benefit from skilled therapeutic intervention in order to improve the following deficits and impairments:  Decreased knowledge of use of DME, Decreased skin integrity, Increased edema, Impaired flexibility, Pain, Decreased mobility, Impaired sensation, Decreased activity tolerance, Decreased range of motion, Decreased balance, Decreased knowledge of precautions, Difficulty walking  Visit Diagnosis: Lymphedema, not elsewhere classified    Problem List Patient Active Problem List   Diagnosis Date Noted  . Blood glucose elevated 07/12/2015  . Carotid artery disease ([place]) 07/06/2015  . Subclavian arterial stenosis ([place]) 06/14/2015  . Left shoulder pain 02/14/2015  . Left knee pain 02/14/2015  . Gonalgia 02/14/2015  . Pain in shoulder 02/14/2015  . Cancer of rectum ([place]) 01/11/2015  . Malignant neoplasm of rectum ([place]) 01/11/2015  . Health care maintenance 06/13/2014  . Encounter for general adult medical examination without abnormal findings 06/13/2014  . Neuropathy ([place]) 03/07/2014  . Urinary incontinence 03/07/2014  . Left carotid bruit 03/07/2014  . Nephrolithiasis 03/07/2014  . Calculus of kidney 03/07/2014  . Other  specified symptoms and signs involving the circulatory and respiratory systems 03/07/2014  . Polyneuropathy ([place]) 03/07/2014  . Hypercholesterolemia 03/04/2014  . Essential hypertension 03/04/2014  . Colon cancer ([place]) 03/04/2014  . Essential (primary) hypertension 03/04/2014  . Malignant neoplasm of colon ([place]) 03/04/2014  . Pure hypercholesterolemia 03/04/2014  . Type 2 diabetes mellitus ([place]) 07/14/2013    Andrey Spearman, MS, OTR/L, [facility] 09/09/15 2:32 PM  [place] MAIN [facility] SERVICES [address] [place], [place], [address] Phone: [phone]   Fax:  [phone]  Name: Kimberlyn Loftus MRN: [id number] Date of Birth: 05/29/1935

## 2015-09-09 NOTE — Patient Instructions (Signed)
LE instructions and precautions as established- see initial eval.   

## 2015-09-12 ENCOUNTER — Ambulatory Visit: Payer: Medicare Other | Admitting: Occupational Therapy

## 2015-09-12 DIAGNOSIS — I890 Lymphedema, not elsewhere classified: Secondary | ICD-10-CM

## 2015-09-12 NOTE — Patient Instructions (Signed)
LE instructions and precautions as established- see initial eval.   

## 2015-09-12 NOTE — Therapy (Signed)
[place] MAIN [facility] SERVICES [address] [place], [place], [address] Phone: [phone]   Fax:  [phone]  Occupational Therapy Treatment  Patient Details  Name: Christina Mathis MRN: [id number] Date of Birth: 1935/06/03 No Data Recorded  Encounter Date: 09/12/2015      OT End of Session - 09/12/15 1417    Visit Number 18   Number of Visits 36   Date for OT Re-Evaluation 10/20/15   OT Start Time 0110   OT Stop Time 0210   OT Time Calculation (min) 60 min   Equipment Utilized During Treatment Juzo friction pad, mediven doffing aide, Juzo slippiegator, friction gloves   Activity Tolerance Patient tolerated treatment well;No increased pain   Behavior During Therapy WFL for tasks assessed/performed      Past Medical History:  Diagnosis Date  . Colon cancer ([place])   . History of shingles    twice  . Hypercholesteremia   . Hypertension   . Left carotid bruit   . Nephrolithiasis   . Neuropathy ([place])   . Urine incontinence 03/06/2013   h/o    Past Surgical History:  Procedure Laterality Date  . APPENDECTOMY  1979   colorectal  . CESAREAN SECTION    . COLONOSCOPY WITH PROPOFOL N/A 03/21/2015   Procedure: COLONOSCOPY WITH PROPOFOL;  Surgeon: Hulen Luster, MD;  Location: [facility] ENDOSCOPY;  Service: Gastroenterology;  Laterality: N/A;  . COLOSTOMY    . surgery for colorectal cancer    . VENTRAL HERNIA REPAIR Right [id number]    There were no vitals filed for this visit.      Subjective Assessment - 09/12/15 1412    Subjective  Pt presents for OT treatment visit 17 to address BLE LE . Pt brings OTS , Juzo Dynamic, ccl 2 ( 30-40 mmHg) knee length , closed toe compression garments to clinic for fitting.   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 S/p surgery and colonostomy 03/2013. 01/2014 bilateral hernia repairs. Post chemo neuropathy B feet.  Hx frequent falls last yearnever smoker.   Limitations decreased sensation B feet, falls risk, LLE pain and swelling, decreased standing tolerance and balance, difficulty walking   Patient Stated Goals decrease swelling and keep it from getting worse so it doesnt affect my doing things   Currently in Pain? Yes  R groin- injured last week doing yard work. No better today   Pain Location Groin   Pain Onset In the past 7 days                      OT Treatments/Exercises (OP) - 09/12/15 0001      ADLs   ADL Education Given Yes     Manual Therapy   Manual Therapy Edema management   Manual therapy comments Fitting OTS Juzo 30-40 mmHg A-d, closed toe, sz 2, SHORT                OT Education - 09/12/15 1416    Education provided Yes   Education Details emphasis of LE ADL training today on donning / doffing elastic compression garments using assistive devices and specialized techniques   Person(s) Educated Patient   Methods Explanation;Demonstration;Tactile cues;Verbal cues;Handout   Comprehension Verbalized understanding;Returned demonstration;Verbal cues required;Tactile cues required             OT Long Term Goals - 07/22/15 1508  OT LONG TERM GOAL #1   Title Lymphedema (LE) self-care: Pt able to apply multi layered, gradient compression wraps independently using proper techniques within 2 weeks to achieve optimal limb volume reduction.   Baseline dependent   Time 2   Period Weeks   Status New     OT LONG TERM GOAL #2   Title Lymphedema (LE) self-care:  Pt to achieve at least 10% LLE limb volume reductions during Intensive CDT to limit LE progression, decrease infection risk, to reduce pain/ discomfort, and to improve safe ambulation and functional mobility.   Baseline dependent   Time 12   Period Weeks   Status New     OT LONG TERM GOAL #3   Title Lymphedema (LE) self-care:  Pt >/= 85 % compliant with all daily, LE self-care protocols for home  program, including simple self-manual lymphatic drainage (MLD), skin care, lymphatic pumping the ex, skin care, and donning/ doffing compression wraps and garments o limit LE progression and further functional decline.     Baseline dependent   Time 12   Period Weeks   Status New     OT LONG TERM GOAL #4   Title Lymphedema (LE) self-care:  Pt able to perform all lymphedema self-care protocols independently within 12 weeks to limit progression and to decrease associated pain to improve functional performance in all domains.   Baseline dependent   Time 12   Period Weeks   Status New     OT LONG TERM GOAL #5   Title Lymphedema (LE) self-care:  During Management Phase CDT Pt to sustain limb volume reductions achieved during Intensive Phase CDT within 5% utilizing LE self-care protocols, appropriate compression garments/ devices, and needed level of caregiver assistance.   Baseline dependent   Time 6   Period Months   Status New               Plan - 09/12/15 1418    Clinical Impression Statement Emphasis of OT session today on fitting compression garments  (Juzo Dynamic ( 3512) ccl 2 (30-40 mmHg) sz 2, SHORT, closed toe, no SB) and on skilled LE ADL training for donning and doffomg garments using assistive devices and special techniques. Garments appear to fit and function well. Initial assessment reveals swelling appears well controlled and garments fet very well at all landmarks. By end of session Pt able to don and doff stockings with a little extra time using friction gloves only. Pt agrees with plan to call PRN. We;ll see her again in 1 week to check limb volumes, then if stable will f/u again in 1 month.   Rehab Potential Good   Clinical Impairments Affecting Rehab Potential Pt continues to make progress towards OT goals. Limb swelling continues to reduce, although Pt is approaching clinical plateau. Skin is well hydrated with slight lightening of hemociderine stain since commencing  OT. Pt has had no falls since commencing OT and she is diligent w/ LE self care.   OT Frequency 3x / week   OT Duration 12 weeks   OT Treatment/Interventions Self-care/ADL training;DME and/or AE instruction;Manual lymph drainage;Patient/family education;Compression bandaging;Therapeutic exercises;Therapeutic activities;Manual Therapy      Patient will benefit from skilled therapeutic intervention in order to improve the following deficits and impairments:  Decreased knowledge of use of DME, Decreased skin integrity, Increased edema, Impaired flexibility, Pain, Decreased mobility, Impaired sensation, Decreased activity tolerance, Decreased range of motion, Decreased balance, Decreased knowledge of precautions, Difficulty walking  Visit Diagnosis: Lymphedema, not elsewhere classified  Problem List Patient Active Problem List   Diagnosis Date Noted  . Blood glucose elevated 07/12/2015  . Carotid artery disease ([place]) 07/06/2015  . Subclavian arterial stenosis ([place]) 06/14/2015  . Left shoulder pain 02/14/2015  . Left knee pain 02/14/2015  . Gonalgia 02/14/2015  . Pain in shoulder 02/14/2015  . Cancer of rectum ([place]) 01/11/2015  . Malignant neoplasm of rectum ([place]) 01/11/2015  . Health care maintenance 06/13/2014  . Encounter for general adult medical examination without abnormal findings 06/13/2014  . Neuropathy ([place]) 03/07/2014  . Urinary incontinence 03/07/2014  . Left carotid bruit 03/07/2014  . Nephrolithiasis 03/07/2014  . Calculus of kidney 03/07/2014  . Other specified symptoms and signs involving the circulatory and respiratory systems 03/07/2014  . Polyneuropathy ([place]) 03/07/2014  . Hypercholesterolemia 03/04/2014  . Essential hypertension 03/04/2014  . Colon cancer ([place]) 03/04/2014  . Essential (primary) hypertension 03/04/2014  . Malignant neoplasm of colon ([place]) 03/04/2014  . Pure hypercholesterolemia 03/04/2014  . Type 2 diabetes mellitus ([place]) 07/14/2013     Andrey Spearman, MS, OTR/L, [facility] 09/12/15 2:25 PM   [place] MAIN [facility] SERVICES [address] [place], [place], [address] Phone: [phone]   Fax:  [phone]  Name: Christina Mathis MRN: [id number] Date of Birth: 08/29/1935

## 2015-09-13 ENCOUNTER — Encounter: Payer: Medicare Other | Admitting: Occupational Therapy

## 2015-09-14 ENCOUNTER — Ambulatory Visit: Payer: Medicare Other | Admitting: Occupational Therapy

## 2015-09-16 ENCOUNTER — Ambulatory Visit: Payer: Medicare Other | Admitting: Occupational Therapy

## 2015-09-19 ENCOUNTER — Ambulatory Visit: Payer: Medicare Other | Admitting: Occupational Therapy

## 2015-09-21 ENCOUNTER — Ambulatory Visit: Payer: Medicare Other | Attending: Internal Medicine | Admitting: Physical Therapy

## 2015-09-21 ENCOUNTER — Ambulatory Visit: Payer: Medicare Other | Admitting: Occupational Therapy

## 2015-09-21 DIAGNOSIS — I890 Lymphedema, not elsewhere classified: Secondary | ICD-10-CM | POA: Diagnosis not present

## 2015-09-21 DIAGNOSIS — M6281 Muscle weakness (generalized): Secondary | ICD-10-CM | POA: Insufficient documentation

## 2015-09-21 NOTE — Patient Instructions (Signed)
LE instructions and precautions as established- see initial eval.   

## 2015-09-21 NOTE — Therapy (Signed)
[place] MAIN [facility] SERVICES [address] [place], [place], [address] Phone: [phone]   Fax:  [phone]  Occupational Therapy Treatment  Patient Details  Name: Christina Mathis MRN: [id number] Date of Birth: 1935/09/10 No Data Recorded  Encounter Date: 09/21/2015      OT End of Session - 09/21/15 1013    Visit Number 19   Number of Visits 36   Date for OT Re-Evaluation 10/20/15   OT Start Time 0909   OT Stop Time 1010   OT Time Calculation (min) 61 min   Equipment Utilized During Treatment Juzo friction pad, mediven doffing aide, Juzo slippiegator, friction gloves   Activity Tolerance Patient tolerated treatment well;No increased pain   Behavior During Therapy WFL for tasks assessed/performed      Past Medical History:  Diagnosis Date  . Colon cancer ([place])   . History of shingles    twice  . Hypercholesteremia   . Hypertension   . Left carotid bruit   . Nephrolithiasis   . Neuropathy ([place])   . Urine incontinence 03/06/2013   h/o    Past Surgical History:  Procedure Laterality Date  . APPENDECTOMY  1979   colorectal  . CESAREAN SECTION    . COLONOSCOPY WITH PROPOFOL N/A 03/21/2015   Procedure: COLONOSCOPY WITH PROPOFOL;  Surgeon: Hulen Luster, MD;  Location: [facility] ENDOSCOPY;  Service: Gastroenterology;  Laterality: N/A;  . COLOSTOMY    . surgery for colorectal cancer    . VENTRAL HERNIA REPAIR Right [phone]    There were no vitals filed for this visit.      Subjective Assessment - 09/21/15 1449    Subjective  Pt presents for OT treatment visit 18 to address BLE LE . Pt arrives wearing, ccl 2 ( 30-40 mmHg) knee length , closed toe compression garments to clinic. Pt reports donning garments continues to be challenging for her. "I don't wear the gloves like you showed me. I may need a bigger size." Pt also reports that she has PT eval/ screening after OT today.   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 S/p surgery and colonostomy 03/2013. 01/2014 bilateral hernia repairs. Post chemo neuropathy B feet. Hx frequent falls last yearnever smoker.   Limitations decreased sensation B feet, falls risk, LLE pain and swelling, decreased standing tolerance and balance, difficulty walking   Patient Stated Goals decrease swelling and keep it from getting worse so it doesnt affect my doing things   Pain Onset In the past 7 days             LYMPHEDEMA/ONCOLOGY QUESTIONNAIRE - 09/21/15 1451      Right Lower Extremity Lymphedema   Other RLE ( non treatment leg) Limb volume from Ankle to below knee (A-D) = 2720.67 ml.    Other RLE Ankle to groin (A_G) = 8864.39 ml   Other RLE A-D limb volume is decreased by 8.87 % since commencing OT for CDT on 07/29/15. RLE A-G limb volume is decreased by 12% overall since 07/29/15.      Left Lower Extremity Lymphedema   Other LLE A-D volume = 2858.23 ml   Other LLE A-G = 8254.94 ml.     Other Overall A-D limb volume reduction for LLE measures 14.1%. Overall A-G limb volume reduction measures 12.15%. LVD for A-D landmarks measures 4.81%, L>R today, compared w/ initial  LVD measuring 10.27%, L>R. A-G LVD  is WNL today at 0.14%, R>L,.                 OT Treatments/Exercises (OP) - 09/21/15 0001      ADLs   ADL Education Given Yes     Manual Therapy   Manual Therapy Edema management;Taping   Edema Management BLE comparative volumetrics                     OT Long Term Goals - 07/22/15 1508      OT LONG TERM GOAL #1   Title Lymphedema (LE) self-care: Pt able to apply multi layered, gradient compression wraps independently using proper techniques within 2 weeks to achieve optimal limb volume reduction.   Baseline dependent   Time 2   Period Weeks   Status New     OT LONG TERM GOAL #2   Title Lymphedema (LE) self-care:  Pt to achieve at least 10% LLE limb  volume reductions during Intensive CDT to limit LE progression, decrease infection risk, to reduce pain/ discomfort, and to improve safe ambulation and functional mobility.   Baseline dependent   Time 12   Period Weeks   Status New     OT LONG TERM GOAL #3   Title Lymphedema (LE) self-care:  Pt >/= 85 % compliant with all daily, LE self-care protocols for home program, including simple self-manual lymphatic drainage (MLD), skin care, lymphatic pumping the ex, skin care, and donning/ doffing compression wraps and garments o limit LE progression and further functional decline.     Baseline dependent   Time 12   Period Weeks   Status New     OT LONG TERM GOAL #4   Title Lymphedema (LE) self-care:  Pt able to perform all lymphedema self-care protocols independently within 12 weeks to limit progression and to decrease associated pain to improve functional performance in all domains.   Baseline dependent   Time 12   Period Weeks   Status New     OT LONG TERM GOAL #5   Title Lymphedema (LE) self-care:  During Management Phase CDT Pt to sustain limb volume reductions achieved during Intensive Phase CDT within 5% utilizing LE self-care protocols, appropriate compression garments/ devices, and needed level of caregiver assistance.   Baseline dependent   Time 6   Period Months   Status New               Plan - 09/21/15 1457    Clinical Impression Statement Pt returns for one week OT F/U today after fitting BLE Juzo ccl 2, ( 3512), sz 2, A-D ( closed toe w/o SB) o8/14. Pt able to don and doff compression garments with much less physical effort and joint strain in hands using friction gloves today. Pt encouraged to utilize thses techniques and ADs to conserve hand and finger joints. Garments appear to fit and function well. RLE ( non-treatment leg) A-D volume is decreased  by 8.87% since commencing OT for CDT on 07/29/15. RLE A-G volume is decreased overall by 12%.  Overall A-D limb volume  reduction for LLE measures 14.1%. Overall A-G limb volume reduction measures 12.15%. LVD for A-D landmarks measures 4.81%, L>R today, compared w/ initial  LVD measuring 10.27%, L>R. A-G LVD is WNL today at 0.14%, R>L,. Volumetric goals met for LLE treatment limb. Pt will return for one month OT f/u for volumetrics check and review of self care compliance with LE self care. If symptoms remain stable we'll DC OT next  visit. Pt will call PRN.   Rehab Potential Good   Clinical Impairments Affecting Rehab Potential Pt continues to make progress towards OT goals. Limb swelling continues to reduce, although Pt is approaching clinical plateau. Skin is well hydrated with slight lightening of hemociderine stain since commencing OT. Pt has had no falls since commencing OT and she is diligent w/ LE self care.   OT Frequency 3x / week   OT Duration 12 weeks   OT Treatment/Interventions Self-care/ADL training;DME and/or AE instruction;Manual lymph drainage;Patient/family education;Compression bandaging;Therapeutic exercises;Therapeutic activities;Manual Therapy      Patient will benefit from skilled therapeutic intervention in order to improve the following deficits and impairments:  Decreased knowledge of use of DME, Decreased skin integrity, Increased edema, Impaired flexibility, Pain, Decreased mobility, Impaired sensation, Decreased activity tolerance, Decreased range of motion, Decreased balance, Decreased knowledge of precautions, Difficulty walking  Visit Diagnosis: Lymphedema, not elsewhere classified    Problem List Patient Active Problem List   Diagnosis Date Noted  . Blood glucose elevated 07/12/2015  . Carotid artery disease ([place]) 07/06/2015  . Subclavian arterial stenosis ([place]) 06/14/2015  . Left shoulder pain 02/14/2015  . Left knee pain 02/14/2015  . Gonalgia 02/14/2015  . Pain in shoulder 02/14/2015  . Cancer of rectum ([place]) 01/11/2015  . Malignant neoplasm of rectum ([place]) 01/11/2015  .  Health care maintenance 06/13/2014  . Encounter for general adult medical examination without abnormal findings 06/13/2014  . Neuropathy ([place]) 03/07/2014  . Urinary incontinence 03/07/2014  . Left carotid bruit 03/07/2014  . Nephrolithiasis 03/07/2014  . Calculus of kidney 03/07/2014  . Other specified symptoms and signs involving the circulatory and respiratory systems 03/07/2014  . Polyneuropathy ([place]) 03/07/2014  . Hypercholesterolemia 03/04/2014  . Essential hypertension 03/04/2014  . Colon cancer ([place]) 03/04/2014  . Essential (primary) hypertension 03/04/2014  . Malignant neoplasm of colon ([place]) 03/04/2014  . Pure hypercholesterolemia 03/04/2014  . Type 2 diabetes mellitus ([place]) 07/14/2013    Andrey Spearman, MS, OTR/L, [facility] 09/21/15 3:15 PM   [place] MAIN [facility] SERVICES [address] [place], [place], [address] Phone: [phone]   Fax:  [phone]  Name: Christina Mathis MRN: [id number] Date of Birth: October 05, 1935

## 2015-09-21 NOTE — Therapy (Signed)
[place] MAIN [facility] SERVICES [address] [place], [place], [address] Phone: [phone]   Fax:  [phone]  Physical Therapy Evaluation  Patient Details  Name: Christina Mathis MRN: [id number] Date of Birth: Sep 12, 1935 No Data Recorded  Encounter Date: 09/21/2015    Past Medical History:  Diagnosis Date  . Colon cancer ([place])   . History of shingles    twice  . Hypercholesteremia   . Hypertension   . Left carotid bruit   . Nephrolithiasis   . Neuropathy ([place])   . Urine incontinence 03/06/2013   h/o    Past Surgical History:  Procedure Laterality Date  . APPENDECTOMY  1979   colorectal  . CESAREAN SECTION    . COLONOSCOPY WITH PROPOFOL N/A 03/21/2015   Procedure: COLONOSCOPY WITH PROPOFOL;  Surgeon: Hulen Luster, MD;  Location: [facility] ENDOSCOPY;  Service: Gastroenterology;  Laterality: N/A;  . COLOSTOMY    . surgery for colorectal cancer    . VENTRAL HERNIA REPAIR Right [phone]    There were no vitals filed for this visit.    PT/OT/SLP Screening Form   Time: in_11:10_____     Time out_11:30____   Complaint _frequent falls, weakness BLE__________________ Past Medical Hx:  Neuropathy,colon cancer, lymphedema BLE________________ Injury Date:_weakness ______________________________  Pain Scale: __right hip pain from recent fall 5/10 __________ Patient's phone number: [phone]  Hx (this occurrence):   Patient has had several falls. She had chemo and finished Sep 01, 2015.     Assessment: BLE hip weakness 3/5 with -3/5 BLE hip add, BLE ankle PF is 4/5 strength. Patient has deficits with single leg stand and tandem stand and has decreased dynamic standing balance     Recommendations:  Get PT order for evaluation for strengthening and balance.   Comments:    []  Patient would benefit from an MD referral [x]  Patient would benefit from a full PT/OT/ SLP evaluation and treatment. []  No intervention recommended at this  time.                                     Patient will benefit from skilled therapeutic intervention in order to improve the following deficits and impairments:     Visit Diagnosis: Muscle weakness (generalized)     Problem List Patient Active Problem List   Diagnosis Date Noted  . Blood glucose elevated 07/12/2015  . Carotid artery disease ([place]) 07/06/2015  . Subclavian arterial stenosis ([place]) 06/14/2015  . Left shoulder pain 02/14/2015  . Left knee pain 02/14/2015  . Gonalgia 02/14/2015  . Pain in shoulder 02/14/2015  . Cancer of rectum ([place]) 01/11/2015  . Malignant neoplasm of rectum ([place]) 01/11/2015  . Health care maintenance 06/13/2014  . Encounter for general adult medical examination without abnormal findings 06/13/2014  . Neuropathy ([place]) 03/07/2014  . Urinary incontinence 03/07/2014  . Left carotid bruit 03/07/2014  . Nephrolithiasis 03/07/2014  . Calculus of kidney 03/07/2014  . Other specified symptoms and signs involving the circulatory and respiratory systems 03/07/2014  . Polyneuropathy ([place]) 03/07/2014  . Hypercholesterolemia 03/04/2014  . Essential hypertension 03/04/2014  . Colon cancer ([place]) 03/04/2014  . Essential (primary) hypertension 03/04/2014  . Malignant neoplasm of colon ([place]) 03/04/2014  . Pure hypercholesterolemia 03/04/2014  . Type 2 diabetes mellitus ([place]) 07/14/2013   Alanson Puls, PT, DPT [place], [place] S 09/21/2015, 11:10 AM  [place] MAIN REHAB  [place], [place], [address] Phone: [phone]   Fax:  [phone]  Name: Christina Mathis MRN: [id number] Date of Birth: [birth year]

## 2015-09-23 ENCOUNTER — Ambulatory Visit: Payer: Medicare Other | Admitting: Occupational Therapy

## 2015-09-26 ENCOUNTER — Ambulatory Visit: Payer: Medicare Other | Admitting: Occupational Therapy

## 2015-09-26 DIAGNOSIS — M7552 Bursitis of left shoulder: Secondary | ICD-10-CM | POA: Diagnosis not present

## 2015-09-26 DIAGNOSIS — M7071 Other bursitis of hip, right hip: Secondary | ICD-10-CM | POA: Diagnosis not present

## 2015-09-26 DIAGNOSIS — M7550 Bursitis of unspecified shoulder: Secondary | ICD-10-CM | POA: Insufficient documentation

## 2015-09-28 ENCOUNTER — Ambulatory Visit: Payer: Medicare Other | Admitting: Occupational Therapy

## 2015-09-30 ENCOUNTER — Ambulatory Visit: Payer: Medicare Other | Admitting: Occupational Therapy

## 2015-10-04 ENCOUNTER — Ambulatory Visit
Admission: RE | Admit: 2015-10-04 | Discharge: 2015-10-04 | Disposition: A | Discharge status: Home / Self Care | Payer: Medicare Other | Source: Ambulatory Visit | Attending: Internal Medicine | Admitting: Internal Medicine

## 2015-10-04 DIAGNOSIS — M85832 Other specified disorders of bone density and structure, left forearm: Secondary | ICD-10-CM | POA: Insufficient documentation

## 2015-10-04 DIAGNOSIS — M85861 Other specified disorders of bone density and structure, right lower leg: Secondary | ICD-10-CM | POA: Diagnosis not present

## 2015-10-04 DIAGNOSIS — Z780 Asymptomatic menopausal state: Secondary | ICD-10-CM | POA: Insufficient documentation

## 2015-10-04 DIAGNOSIS — M85851 Other specified disorders of bone density and structure, right thigh: Secondary | ICD-10-CM | POA: Diagnosis not present

## 2015-10-04 DIAGNOSIS — E2839 Other primary ovarian failure: Secondary | ICD-10-CM | POA: Diagnosis not present

## 2015-10-05 ENCOUNTER — Encounter: Payer: Medicare Other | Admitting: Occupational Therapy

## 2015-10-05 ENCOUNTER — Encounter: Payer: Self-pay | Admitting: *Deleted

## 2015-10-05 DIAGNOSIS — Z933 Colostomy status: Secondary | ICD-10-CM | POA: Diagnosis not present

## 2015-10-05 DIAGNOSIS — Z85038 Personal history of other malignant neoplasm of large intestine: Secondary | ICD-10-CM | POA: Diagnosis not present

## 2015-10-07 ENCOUNTER — Ambulatory Visit: Payer: Medicare Other | Attending: Internal Medicine | Admitting: Occupational Therapy

## 2015-10-07 DIAGNOSIS — I890 Lymphedema, not elsewhere classified: Secondary | ICD-10-CM | POA: Diagnosis not present

## 2015-10-07 NOTE — Patient Instructions (Signed)

## 2015-10-07 NOTE — Therapy (Signed)
[place] MAIN [facility] SERVICES [address] [place], [place], [address] Phone: [phone]   Fax:  [phone]  Occupational Therapy Treatment, Progress Note & Discharge Summary  Patient Details  Name: Christina Mathis MRN: [id number] Date of Birth: Nov 18, 1935 No Data Recorded  Encounter Date: 10/07/2015      OT End of Session - 10/07/15 1229    Visit Number 20   Number of Visits 36   Date for OT Re-Evaluation 10/20/15   OT Start Time 0958   OT Stop Time 1102   OT Time Calculation (min) 64 min   Equipment Utilized During Treatment Juzo friction pad, mediven doffing aide, Juzo slippiegator, friction gloves   Activity Tolerance Patient tolerated treatment well;No increased pain   Behavior During Therapy WFL for tasks assessed/performed      Past Medical History:  Diagnosis Date  . Colon cancer ([place])   . History of shingles    twice  . Hypercholesteremia   . Hypertension   . Left carotid bruit   . Nephrolithiasis   . Neuropathy ([place])   . Urine incontinence 03/06/2013   h/o    Past Surgical History:  Procedure Laterality Date  . APPENDECTOMY  1979   colorectal  . CESAREAN SECTION    . COLONOSCOPY WITH PROPOFOL N/A 03/21/2015   Procedure: COLONOSCOPY WITH PROPOFOL;  Surgeon: Hulen Luster, MD;  Location: [facility] ENDOSCOPY;  Service: Gastroenterology;  Laterality: N/A;  . COLOSTOMY    . surgery for colorectal cancer    . VENTRAL HERNIA REPAIR Right [phone]    There were no vitals filed for this visit.      Subjective Assessment - 10/07/15 1000    Subjective  (P)  Pt presents for OT treatment visit  19 for follow up after CDT to LLE lymphedema. Pt comes to clinic wearing perscribed compression garments and has no new complaints. "I feel like I'm doing pretty good with my legs even though the stockings are still hard to get on."   Patient is accompained by: (P)  Family member   Pertinent History (P)  Hx stage III adenocarcinoma of  rectum w/ preop chemoradiato completed 12/2012. S/p surgery and colonostomy 03/2013. 01/2014 bilateral hernia repairs. Post chemo neuropathy B feet. Hx frequent falls last yearnever smoker.   Limitations (P)  decreased sensation B feet, falls risk, LLE pain and swelling, decreased standing tolerance and balance, difficulty walking   Patient Stated Goals (P)  decrease swelling and keep it from getting worse so it doesnt affect my doing things   Pain Onset (P)  In the past 7 days             LYMPHEDEMA/ONCOLOGY QUESTIONNAIRE - 10/07/15 1308      Right Lower Extremity Lymphedema   Other (P)  RLE ( non treatment leg) Limb volume from Ankle to below knee (A-D) = 2735.45 ml.    Other (P)  RLE Ankle to groin (A_G) = 9091.69 ml   Other (P)  RLE A-D volume is increased by 0.54% since last measured on 8/28. A-D limb volume is decreased by 8.37 % since commencing OT for CDT on 07/29/15. RLE A-G limb volume is decreased by 9.8% ioverall since 07/29/15.      Left Lower Extremity Lymphedema   Other (P)  LLE A-D volume = 2858.23 ml   Other (P)  LLE A-G = 8254.94 ml.  OT Treatments/Exercises (OP) - 10/07/15 0001      ADLs   ADL Education Given Yes     Manual Therapy   Manual Therapy Edema management;Taping   Edema Management BLE comparative volumetrics   Compression Bandaging Compression garment assessment                OT Education - 10/07/15 1306    Education provided Yes   Education Details Reviewed progress towards goals and DC plan. Reviewed Management Phase  self care regimes. Reviewed proper garment donning techniques using assitive devices intended to conserve physical effort and limit joint strain. Pt instructed on these techniques several times, but reverts back to her original techniques, which require extra time and increased effort.,   Person(s) Educated Patient   Methods Explanation;Demonstration;Tactile cues;Verbal cues   Comprehension  Verbalized understanding;Returned demonstration;Verbal cues required;Tactile cues required             OT Long Term Goals - 10/07/15 1427      OT LONG TERM GOAL #1   Title Lymphedema (LE) self-care: Pt able to apply multi layered, gradient compression wraps independently using proper techniques within 2 weeks to achieve optimal limb volume reduction.   Baseline dependent   Time 2   Period Weeks   Status Achieved     OT LONG TERM GOAL #2   Title Lymphedema (LE) self-care:  Pt to achieve at least 10% LLE limb volume reductions during Intensive CDT to limit LE progression, decrease infection risk, to reduce pain/ discomfort, and to improve safe ambulation and functional mobility. MET W LLE 11% A-D REDUCTION (BELOW l KNEE) AND 9.8% RA-G REDUCTION  LEG ( ANKLE-GROIN)   Baseline dependent   Time 12   Period Weeks   Status Partially Met     OT LONG TERM GOAL #3   Title Lymphedema (LE) self-care:  Pt >/= 85 % compliant with all daily, LE self-care protocols for home program, including simple self-manual lymphatic drainage (MLD), skin care, lymphatic pumping the ex, skin care, and donning/ doffing compression wraps and garments o limit LE progression and further functional decline.     Baseline dependent   Time 12   Status Achieved     OT LONG TERM GOAL #4   Title Lymphedema (LE) self-care:  Pt able to perform all lymphedema self-care protocols independently within 12 weeks to limit progression and to decrease associated pain to improve functional performance in all domains.   Baseline dependent   Time 12   Status Achieved     OT LONG TERM GOAL #5   Title Lymphedema (LE) self-care:  During Management Phase CDT Pt to sustain limb volume reductions achieved during Intensive Phase CDT within 5% utilizing LE self-care protocols, appropriate compression garments/ devices, and needed level of caregiver assistance.   Baseline dependent   Time 6   Period Months   Status Partially Met                Plan - 10/07/15 1433    Clinical Impression Statement Pt returns for 1 month f/u for lymphedema care. Compression garments are in good condition and swelling is very well managed. BLE limb volumetrics reveal LLE below the knee (A-D) limb volume reduction of 11% since comencing OT for CDT on 07/29/15. LLE A-G ( ankle to groin) limb volume is decreased overall by 8.59%. RLE A-D volume is decreased overall by 8.37%. A-G volume reduction measures 9.8% overall. While 10% limb volume reduction goal was not met at LLE  A-G landmark, it was achieved below the knee. It was also achieved for full RLE, which was not treated. All other goals met. Pt agrees w/ plan to DC OT for LE care. I assisted her today with ordering a second pair of compresion garments and some silicone donning lotion. Pt will call PRN. I wish her veryty well.    Rehab Potential Good   Clinical Impairments Affecting Rehab Potential Pt continues to make progress towards OT goals. Limb swelling continues to reduce, although Pt is approaching clinical plateau. Skin is well hydrated with slight lightening of hemociderine stain since commencing OT. Pt has had no falls since commencing OT and she is diligent w/ LE self care.   OT Frequency 3x / week   OT Duration 12 weeks   OT Treatment/Interventions Self-care/ADL training;DME and/or AE instruction;Manual lymph drainage;Patient/family education;Compression bandaging;Therapeutic exercises;Therapeutic activities;Manual Therapy      Patient will benefit from skilled therapeutic intervention in order to improve the following deficits and impairments:  Decreased knowledge of use of DME, Decreased skin integrity, Increased edema, Impaired flexibility, Pain, Decreased mobility, Impaired sensation, Decreased activity tolerance, Decreased range of motion, Decreased balance, Decreased knowledge of precautions, Difficulty walking  Visit Diagnosis: Lymphedema, not elsewhere classified       G-Codes - October 26, 2015 1432    Functional Assessment Tool Used Clinical observation and examination, medical records review, Pt and caregiver interview, comparative limb volumetrics   Functional Limitation Self care   Self Care Current Status (X0940) At least 1 percent but less than 20 percent impaired, limited or restricted   Self Care Discharge Status [phone]) At least 1 percent but less than 20 percent impaired, limited or restricted      Problem List Patient Active Problem List   Diagnosis Date Noted  . Blood glucose elevated 07/12/2015  . Carotid artery disease ([place]) 07/06/2015  . Subclavian arterial stenosis ([place]) 06/14/2015  . Left shoulder pain 02/14/2015  . Left knee pain 02/14/2015  . Gonalgia 02/14/2015  . Pain in shoulder 02/14/2015  . Cancer of rectum ([place]) 01/11/2015  . Malignant neoplasm of rectum ([place]) 01/11/2015  . Health care maintenance 06/13/2014  . Encounter for general adult medical examination without abnormal findings 06/13/2014  . Neuropathy ([place]) 03/07/2014  . Urinary incontinence 03/07/2014  . Left carotid bruit 03/07/2014  . Nephrolithiasis 03/07/2014  . Calculus of kidney 03/07/2014  . Other specified symptoms and signs involving the circulatory and respiratory systems 03/07/2014  . Polyneuropathy ([place]) 03/07/2014  . Hypercholesterolemia 03/04/2014  . Essential hypertension 03/04/2014  . Colon cancer ([place]) 03/04/2014  . Essential (primary) hypertension 03/04/2014  . Malignant neoplasm of colon ([place]) 03/04/2014  . Pure hypercholesterolemia 03/04/2014  . Type 2 diabetes mellitus ([place]) 07/14/2013    Andrey Spearman, MS, OTR/L, [facility] October 26, 2015 2:42 PM  [place] MAIN [facility] SERVICES [address] [place], [place], [address] Phone: [phone]   Fax:  [phone]  Name: Christina Mathis MRN: [id number] Date of Birth: 09/05/1935

## 2015-10-18 ENCOUNTER — Encounter: Payer: Self-pay | Admitting: Internal Medicine

## 2015-10-18 ENCOUNTER — Ambulatory Visit (INDEPENDENT_AMBULATORY_CARE_PROVIDER_SITE_OTHER): Payer: Medicare Other | Admitting: Internal Medicine

## 2015-10-18 VITALS — BP 112/60 | HR 70 | Temp 97.3°F | Ht 62.5 in | Wt 158.8 lb

## 2015-10-18 DIAGNOSIS — G629 Polyneuropathy, unspecified: Secondary | ICD-10-CM | POA: Diagnosis not present

## 2015-10-18 DIAGNOSIS — E119 Type 2 diabetes mellitus without complications: Secondary | ICD-10-CM

## 2015-10-18 DIAGNOSIS — C189 Malignant neoplasm of colon, unspecified: Secondary | ICD-10-CM

## 2015-10-18 DIAGNOSIS — Z23 Encounter for immunization: Secondary | ICD-10-CM

## 2015-10-18 DIAGNOSIS — Z1239 Encounter for other screening for malignant neoplasm of breast: Secondary | ICD-10-CM | POA: Diagnosis not present

## 2015-10-18 DIAGNOSIS — Z0000 Encounter for general adult medical examination without abnormal findings: Secondary | ICD-10-CM

## 2015-10-18 DIAGNOSIS — E7800 Pure hypercholesterolemia, unspecified: Secondary | ICD-10-CM

## 2015-10-18 DIAGNOSIS — M25512 Pain in left shoulder: Secondary | ICD-10-CM

## 2015-10-18 DIAGNOSIS — M8580 Other specified disorders of bone density and structure, unspecified site: Secondary | ICD-10-CM

## 2015-10-18 DIAGNOSIS — I10 Essential (primary) hypertension: Secondary | ICD-10-CM

## 2015-10-18 LAB — BASIC METABOLIC PANEL
BUN: 23 mg/dL (ref 6–23)
CHLORIDE: 104 meq/L (ref 96–112)
CO2: 32 meq/L (ref 19–32)
Calcium: 9.1 mg/dL (ref 8.4–10.5)
Creatinine, Ser: 1.01 mg/dL (ref 0.40–1.20)
GFR: 56.33 mL/min — AB (ref >60.00)
GLUCOSE: 128 mg/dL — AB (ref 70–99)
POTASSIUM: 5.1 meq/L (ref 3.5–5.1)
SODIUM: 141 meq/L (ref 135–145)

## 2015-10-18 LAB — HEPATIC FUNCTION PANEL
ALK PHOS: 70 U/L (ref 39–117)
ALT: 20 U/L (ref 0–35)
AST: 19 U/L (ref 0–37)
Albumin: 3.8 g/dL (ref 3.5–5.2)
BILIRUBIN DIRECT: 0 mg/dL (ref 0.0–0.3)
BILIRUBIN TOTAL: 0.8 mg/dL (ref 0.2–1.2)
TOTAL PROTEIN: 6.8 g/dL (ref 6.0–8.3)

## 2015-10-18 LAB — LIPID PANEL
CHOL/HDL RATIO: 3
Cholesterol: 123 mg/dL (ref 0–200)
HDL: 39.5 mg/dL (ref >39.00)
LDL CALC: 66 mg/dL (ref 0–99)
NONHDL: 83.11
TRIGLYCERIDES: 88 mg/dL (ref 0.0–149.0)
VLDL: 17.6 mg/dL (ref 0.0–40.0)

## 2015-10-18 LAB — MICROALBUMIN / CREATININE URINE RATIO
Creatinine,U: 183.8 mg/dL
MICROALB/CREAT RATIO: 4.1 mg/g (ref 0.0–30.0)
Microalb, Ur: 7.5 mg/dL — ABNORMAL HIGH (ref 0.0–1.9)

## 2015-10-18 LAB — HEMOGLOBIN A1C: Hgb A1c MFr Bld: 6 % (ref 4.6–6.5)

## 2015-10-18 MED ORDER — ALENDRONATE SODIUM 70 MG PO TABS: 70.0000 mg | ORAL_TABLET | ORAL | 11 refills | Status: DC

## 2015-10-18 NOTE — Assessment & Plan Note (Signed)
Physical today 10/18/15.  PAP 06/09/14.  Mammogram 12/06/14 - Birads I.

## 2015-10-18 NOTE — Progress Notes (Signed)
[name], female   DOB: 29-Oct-1935, 80 y.o.   MRN: [id number]   Subjective:    [name], female    DOB: Sep 30, 1935, 80 y.o.   MRN: [id number]  HPI  Patient here for her physical exam.  Has a history of colon cancer.  Followed by GI and oncology.  S/p XRT and chemo.  Last colonoscopy 03/2015 and reported as normal.   She is eating.  Appetite is good.  No chest pain. No sob.  No acid reflux.  Had therapy for lymphedema.  Wearing hose.  Better.  Has seen Dr Sabra Heck for her right him and left shoulder pain.  She is having persistent pain in her right hip.  He did an Insurance account manager.  She plant to f/u with him. She is watching her diet.  Has cut out snacks.  Has lost weight.  Feels good.  Discussed bone density results and discussed treatment options.     Past Medical History:  Diagnosis Date  . Colon cancer ([place])   . History of shingles    twice  . Hypercholesteremia   . Hypertension   . Left carotid bruit   . Nephrolithiasis   . Neuropathy ([place])   . Urine incontinence 03/06/2013   h/o   Past Surgical History:  Procedure Laterality Date  . APPENDECTOMY  1979   colorectal  . CESAREAN SECTION    . COLONOSCOPY WITH PROPOFOL N/A 03/21/2015   Procedure: COLONOSCOPY WITH PROPOFOL;  Surgeon: Hulen Luster, MD;  Location: [facility] ENDOSCOPY;  Service: Gastroenterology;  Laterality: N/A;  . COLOSTOMY    . surgery for colorectal cancer    . VENTRAL HERNIA REPAIR Right [id number]   Family History  Problem Relation Age of Onset  . Hypertension Mother   . Hypertension Father   . Breast cancer Paternal Aunt    Social History   Social History  . Marital status: Widowed    Spouse name: N/A  . Number of children: N/A  . Years of education: N/A   Social History Main Topics  . Smoking status: Never Smoker  . Smokeless tobacco: Never Used  . Alcohol use No  . Drug use: No  . Sexual activity: Not Asked   Other Topics Concern  . None   Social History Narrative  . None     Outpatient Encounter Prescriptions as of 10/18/2015  Medication Sig  . aspirin EC 81 MG tablet Take 81 mg by mouth daily.  Marland Kitchen b complex vitamins tablet Take 1 tablet by mouth daily.  . calcium citrate-vitamin D (CITRACAL+D) 315-200 MG-UNIT per tablet Take 1 tablet by mouth 2 (two) times daily.  Marland Kitchen glucosamine-chondroitin 500-400 MG tablet Take 1 tablet by mouth 2 (two) times daily.  Marland Kitchen loratadine (CLARITIN) 10 MG tablet Take 10 mg by mouth daily as needed.   Marland Kitchen losartan-hydrochlorothiazide (HYZAAR) 50-12.5 MG tablet Take 1 tablet by mouth daily.  Marland Kitchen lovastatin (MEVACOR) 20 MG tablet Take 1 tablet (20 mg total) by mouth at bedtime. (Patient taking differently: Take 10 mg by mouth. Take half of 68m tablet daily)  . Multiple Vitamin (MULTI-VITAMINS) TABS Take by mouth.  . pregabalin (LYRICA) 75 MG capsule Take 1 capsule (75 mg total) by mouth at bedtime.  . [DISCONTINUED] lidocaine-prilocaine (EMLA) cream Apply 1 application topically as needed.  .Marland Kitchenalendronate (FOSAMAX) 70 MG tablet Take 1 tablet (70 mg total) by mouth every 7 (seven) days. Take with a full glass of water on an empty stomach.  .Marland Kitchen  meloxicam (MOBIC) 7.5 MG tablet    Facility-Administered Encounter Medications as of 10/18/2015  Medication  . sodium chloride flush (NS) 0.9 % injection 10 mL  . sodium chloride flush (NS) 0.9 % injection 10 mL    Review of Systems  Constitutional: Negative for appetite change.       She has cut out her snacks.  Lost weight.   HENT: Negative for congestion and sinus pressure.   Eyes: Negative for pain and visual disturbance.  Respiratory: Negative for cough, chest tightness and shortness of breath.   Cardiovascular: Negative for chest pain and palpitations.       Lymphedema therapy - wearing hose - improved.   Gastrointestinal: Negative for abdominal pain, diarrhea and nausea.  Genitourinary: Negative for difficulty urinating and dysuria.  Musculoskeletal: Negative for back pain and joint  swelling.  Skin: Negative for color change and rash.  Neurological: Negative for dizziness, light-headedness and headaches.  Hematological: Negative for adenopathy. Does not bruise/bleed easily.  Psychiatric/Behavioral: Negative for agitation and dysphoric mood.       Objective:     Blood pressure rechecked by me:  132/70  Physical Exam  Constitutional: She is oriented to person, place, and time. She appears well-developed and well-nourished. No distress.  HENT:  Nose: Nose normal.  Mouth/Throat: Oropharynx is clear and moist.  Eyes: Right eye exhibits no discharge. Left eye exhibits no discharge. No scleral icterus.  Neck: Neck supple. No thyromegaly present.  Cardiovascular: Normal rate and regular rhythm.   Pulmonary/Chest: Breath sounds normal. No accessory muscle usage. No tachypnea. No respiratory distress. She has no decreased breath sounds. She has no wheezes. She has no rhonchi. Right breast exhibits no inverted nipple, no mass, no nipple discharge and no tenderness (no axillary adenopathy). Left breast exhibits no inverted nipple, no mass, no nipple discharge and no tenderness (no axilarry adenopathy).  Abdominal: Soft. Bowel sounds are normal. There is no tenderness.  Musculoskeletal: She exhibits no edema or tenderness.  Lymphadenopathy:    She has no cervical adenopathy.  Neurological: She is alert and oriented to person, place, and time.  Skin: Skin is warm. No rash noted. No erythema.  Psychiatric: She has a normal mood and affect. Her behavior is normal.    BP 112/60   Pulse 70   Temp 97.3 F (36.3 C) (Oral)   Ht 5' 2.5" (1.588 m)   Wt 158 lb 12.8 oz (72 kg)   SpO2 95%   BMI 28.58 kg/m  Wt Readings from Last 3 Encounters:  10/18/15 158 lb 12.8 oz (72 kg)  07/12/15 168 lb 6.4 oz (76.4 kg)  07/06/15 169 lb (76.7 kg)     Lab Results  Component Value Date   WBC 6.7 07/12/2015   HGB 12.6 07/12/2015   HCT 36.1 07/12/2015   PLT 252 07/12/2015   GLUCOSE 128  (H) 10/18/2015   CHOL 123 10/18/2015   TRIG 88.0 10/18/2015   HDL 39.50 10/18/2015   LDLCALC 66 10/18/2015   ALT 20 10/18/2015   AST 19 10/18/2015   NA 141 10/18/2015   K 5.1 10/18/2015   CL 104 10/18/2015   CREATININE 1.01 10/18/2015   BUN 23 10/18/2015   CO2 32 10/18/2015   TSH 2.88 02/15/2015   INR 1.1 10/21/2012   HGBA1C 6.0 10/18/2015   MICROALBUR 7.5 (H) 10/18/2015    Dg Bone Density  Result Date: 10/04/2015 EXAM: DUAL X-RAY ABSORPTIOMETRY (DXA) FOR BONE MINERAL DENSITY IMPRESSION: Dear Dr. Nicki Reaper, Your patient Christina Mathis  completed a BMD test on 10/04/2015 using the Lunar iDXA DXA System (analysis version: 14.10) manufactured by [employer]. The following summarizes the results of our evaluation. PATIENT BIOGRAPHICAL: Name: Brotz, Velda Patient ID: [id number] Birth Date: 09/22/1935 Height: 63.0 in. Gender: Female Exam Date: 10/04/2015 Weight: 162.0 lbs. Indications: Caucasian, Height Loss, Postmenopausal Fractures:  Right humerus Treatments: Calcium, Multi-Vitamin with calcium ASSESSMENT: The BMD measured at Femur Neck Right is 0.714 g/cm2 with a T-score of -2.3. This patient is considered osteopenic according to World Health Organization (WHO) criteria. Lumbar spine was not utilized due to advanced degenerative changes. Site Region Measured Date Measured Age WHO Classification Young Adult T-score BMD DualFemur Neck Right 10/04/2015 80.0 Osteopenia -2.3 0.714 g/cm2 Left Forearm Radius 33% 10/04/2015 78.0 Osteopenia -1.2 0.772 g/cm2 World Health Organization (WHO) criteria for post-menopausal, Caucasian Women: Normal:       T-score at or above -1 SD Osteopenia:   T-score between -1 and -2.5 SD Osteoporosis: T-score at or below -2.5 SD RECOMMENDATIONS: National Osteoporosis Foundation recommends that FDA-approved medical therapies be considered in postmenopausal women and men age 50 or older with a: 1. Hip or vertebral (clinical or morphometric) fracture. 2. T-score of < -2.5 at the  spine or hip. 3. Ten-year fracture probability by FRAX of 3% or greater for hip fracture or 20% or greater for major osteoporotic fracture. All treatment decisions require clinical judgment and consideration of individual patient factors, including patient preferences, co-morbidities, previous drug use, risk factors not captured in the FRAX model (e.g. falls, vitamin D deficiency, increased bone turnover, interval significant decline in bone density) and possible under - or over-estimation of fracture risk by FRAX. All patients should ensure an adequate intake of dietary calcium (1200 mg/d) and vitamin D (800 IU daily) unless contraindicated. FOLLOW-UP: People with diagnosed cases of osteoporosis or at high risk for fracture should have regular bone mineral density tests. For patients eligible for Medicare, routine testing is allowed once every 2 years. The testing frequency can be increased to one year for patients who have rapidly progressing disease, those who are receiving or discontinuing medical therapy to restore bone mass, or have additional risk factors. Your patient Australia Pousson completed a FRAX assessment on 10/04/2015 using the Lunar iDXA DXA System (analysis version: 14.10) manufactured by [employer]. The following summarizes the results of our evaluation. PATIENT BIOGRAPHICAL: Name: Weger, Anjeanette Patient ID: [id number] Birth Date: 04/15/1935 Height:    63.0 in. Gender:  [age]    [age]       Weight: 162.0 lbs. Ethnicity:  White       Exam Date: 10/04/2015 FRAX* RESULTS:  (version: 3.5) 10-year Probability of Fracture 1 Major Osteoporotic Fracture 2:  16.9% Hip Fracture:  5.4% Population:  [place] (Caucasian) Risk Factors:  Non Based on Femur (Right) Neck BMD 1 -The 10-year probability of fracture may be lower than reported if the patient has received treatment. 2 -Major Osteoporotic Fracture: Clinical Spine, Forearm, Hip or Shoulder *FRAX is a trademark of the [employer] for Metabolic Bone Disease, a World Health Organization (WHO) Collaborating Centre. ASSESSMENT: The probability of a major osteoporotic fracture is 16.9 within the next ten years. The probability of a hip fracture is 5.4 within the next ten years. Interpreted by: Tommy Lawrence, MD, CCD (Certified Clinical Densitometrist) Electronically Signed   By: Thomas  Lawrence M.D.   On: 10/04/2015 14:26       Assessment & Plan:   Problem List Items Addressed This   Visit    Essential hypertension    Blood pressure under good control.  Continue same medication regimen.  Follow pressures.  Follow metabolic panel.        Health care maintenance    Physical today 10/18/15.  PAP 06/09/14.  Mammogram 12/06/14 - [facility] I.       Hypercholesterolemia    On lovastatin.  Low cholesterol diet and exercise.  Follow lipid panel and liver function tests.        Malignant neoplasm of colon ([place])    Has ostomy.  Followed by oncology.        Osteopenia    Bone density as outlined.  Discussed treatment options.  Start fosamax as directed.  Discussed possible side effects and risk of medication.  Follow.       Pain in shoulder    Saw Dr Sabra Heck.  Shoulder is better.        Polyneuropathy ([place])    On lyrica.        Pure hypercholesterolemia    Low cholesterol diet and exercise.  Follow lipid panel.        Relevant Orders   Lipid panel (Completed)   Hepatic function panel (Completed)   Lipid panel   Hepatic function panel   Type 2 diabetes mellitus (HCC)    Low carb diet and exercise.  She has adjusted her diet.  Cut out snacks.  Follow met b and a1c.       Relevant Orders   Hemoglobin A1c (Completed)   Basic metabolic panel (Completed)   Microalbumin / creatinine urine ratio (Completed)   Hemoglobin H0Q   Basic metabolic panel    Other Visit Diagnoses    Routine general medical examination at a health care facility    -  Primary   Screening breast examination       Relevant  Orders   MM Digital Screening   Encounter for immunization       Relevant Orders   Flu vaccine HIGH DOSE PF (Completed)       Einar Pheasant, MD

## 2015-10-18 NOTE — Progress Notes (Signed)
Pre visit review using our clinic review tool, if applicable. No additional management support is needed unless otherwise documented below in the visit note. 

## 2015-10-19 ENCOUNTER — Telehealth: Payer: Self-pay | Admitting: *Deleted

## 2015-10-19 ENCOUNTER — Other Ambulatory Visit: Payer: Self-pay | Admitting: Internal Medicine

## 2015-10-19 DIAGNOSIS — N289 Disorder of kidney and ureter, unspecified: Secondary | ICD-10-CM

## 2015-10-19 NOTE — Telephone Encounter (Signed)
Patient was informed of results.  Patient understood and no questions, comments, or concerns at this time.  

## 2015-10-19 NOTE — Telephone Encounter (Signed)
Patient requested lab results Pt contact [phone]

## 2015-10-23 ENCOUNTER — Encounter: Payer: Self-pay | Admitting: Internal Medicine

## 2015-10-23 DIAGNOSIS — M8580 Other specified disorders of bone density and structure, unspecified site: Secondary | ICD-10-CM | POA: Insufficient documentation

## 2015-10-23 NOTE — Assessment & Plan Note (Signed)
Bone density as outlined.  Discussed treatment options.  Start fosamax as directed.  Discussed possible side effects and risk of medication.  Follow.

## 2015-10-23 NOTE — Assessment & Plan Note (Signed)
Saw Dr Sabra Heck.  Shoulder is better.

## 2015-10-23 NOTE — Assessment & Plan Note (Signed)
Low cholesterol diet and exercise.  Follow lipid panel.   

## 2015-10-23 NOTE — Assessment & Plan Note (Signed)
S/p XRT and chemo.  Continues to f/u at the cancer center.  Colonoscopy 03/21/15 - as outlined.

## 2015-10-23 NOTE — Assessment & Plan Note (Signed)
On lyrica

## 2015-10-23 NOTE — Assessment & Plan Note (Signed)
Blood pressure under good control.  Continue same medication regimen.  Follow pressures.  Follow metabolic panel.   

## 2015-10-23 NOTE — Assessment & Plan Note (Signed)
On lovastatin.  Low cholesterol diet and exercise.  Follow lipid panel and liver function tests.   

## 2015-10-23 NOTE — Assessment & Plan Note (Signed)
Has ostomy.  Followed by oncology.

## 2015-10-23 NOTE — Assessment & Plan Note (Signed)
Low carb diet and exercise.  She has adjusted her diet.  Cut out snacks.  Follow met b and a1c.

## 2015-10-26 DIAGNOSIS — Z85048 Personal history of other malignant neoplasm of rectum, rectosigmoid junction, and anus: Secondary | ICD-10-CM | POA: Diagnosis not present

## 2015-11-11 LAB — HM DIABETES EYE EXAM

## 2015-11-14 ENCOUNTER — Other Ambulatory Visit (INDEPENDENT_AMBULATORY_CARE_PROVIDER_SITE_OTHER): Payer: Medicare Other

## 2015-11-14 DIAGNOSIS — E119 Type 2 diabetes mellitus without complications: Secondary | ICD-10-CM | POA: Diagnosis not present

## 2015-11-14 DIAGNOSIS — E7800 Pure hypercholesterolemia, unspecified: Secondary | ICD-10-CM

## 2015-11-14 LAB — LIPID PANEL
CHOLESTEROL: 103 mg/dL (ref 0–200)
HDL: 38.1 mg/dL — ABNORMAL LOW (ref >39.00)
LDL Cholesterol: 51 mg/dL (ref 0–99)
NonHDL: 65.11
TRIGLYCERIDES: 71 mg/dL (ref 0.0–149.0)
Total CHOL/HDL Ratio: 3
VLDL: 14.2 mg/dL (ref 0.0–40.0)

## 2015-11-14 LAB — BASIC METABOLIC PANEL
BUN: 18 mg/dL (ref 6–23)
CALCIUM: 8.8 mg/dL (ref 8.4–10.5)
CO2: 32 meq/L (ref 19–32)
CREATININE: 0.87 mg/dL (ref 0.40–1.20)
Chloride: 105 mEq/L (ref 96–112)
GFR: 66.9 mL/min (ref >60.00)
GLUCOSE: 123 mg/dL — AB (ref 70–99)
Potassium: 4.6 mEq/L (ref 3.5–5.1)
Sodium: 143 mEq/L (ref 135–145)

## 2015-11-14 LAB — HEPATIC FUNCTION PANEL
ALT: 22 U/L (ref 0–35)
AST: 21 U/L (ref 0–37)
Albumin: 3.8 g/dL (ref 3.5–5.2)
Alkaline Phosphatase: 53 U/L (ref 39–117)
BILIRUBIN DIRECT: 0.1 mg/dL (ref 0.0–0.3)
BILIRUBIN TOTAL: 0.6 mg/dL (ref 0.2–1.2)
Total Protein: 6.5 g/dL (ref 6.0–8.3)

## 2015-11-14 LAB — HEMOGLOBIN A1C: Hgb A1c MFr Bld: 5.9 % (ref 4.6–6.5)

## 2015-11-16 DIAGNOSIS — H40003 Preglaucoma, unspecified, bilateral: Secondary | ICD-10-CM | POA: Diagnosis not present

## 2015-12-08 ENCOUNTER — Ambulatory Visit
Admission: RE | Admit: 2015-12-08 | Discharge: 2015-12-08 | Disposition: A | Discharge status: Home / Self Care | Payer: Medicare Other | Source: Ambulatory Visit | Attending: Internal Medicine | Admitting: Internal Medicine

## 2015-12-08 ENCOUNTER — Other Ambulatory Visit: Payer: Self-pay | Admitting: Internal Medicine

## 2015-12-08 DIAGNOSIS — Z1239 Encounter for other screening for malignant neoplasm of breast: Secondary | ICD-10-CM

## 2015-12-08 DIAGNOSIS — Z1231 Encounter for screening mammogram for malignant neoplasm of breast: Secondary | ICD-10-CM | POA: Insufficient documentation

## 2016-01-10 ENCOUNTER — Other Ambulatory Visit: Payer: Self-pay | Admitting: *Deleted

## 2016-01-10 DIAGNOSIS — C189 Malignant neoplasm of colon, unspecified: Secondary | ICD-10-CM

## 2016-01-10 NOTE — Progress Notes (Signed)
Christina Mathis  [phone]  [phone]     Christina Mathis DOB: Sep 15, 1935  MR#: [id number]  JHE#:[id number]  Patient Care Team: Einar Pheasant, MD as PCP - General (Internal Medicine) Forest Gleason, MD (Oncology) Wallene Huh, DPM as Consulting Physician (Podiatry)  CHIEF COMPLAINT: Adenocarcinoma of the rectum.  INTERVAL HISTORY:   Patient returns to clinic today for routine follow-up. She was originally diagnosed in 2014 and underwent chemotherapy with radiation as well as a colectomy with colostomy. Patient overall reports feeling very well. Port-A-cath removed in September. She most recently had a colonoscopy in February 2017 which revealed no abnormality, her next will be due in 5 years. Patient denies any blood in her stool, melena, nausea, vomiting, or diarrhea. Patient stopped taking Lyrica daily at bedtime for peripheral neuropathy due to making her head feel "foggy". She also has tried gabapentin which had the same side effect. Peripheral neuropathy is not interfering with activities of daily living. Patient offers no further specific complaints today.  REVIEW OF SYSTEMS:   Review of Systems  Constitutional: Negative.  Negative for fever, malaise/fatigue and weight loss.  Respiratory: Negative.  Negative for cough and shortness of breath.   Cardiovascular: Negative.  Negative for chest pain and leg swelling.  Gastrointestinal: Negative for abdominal pain and blood in stool.  Genitourinary: Negative.   Musculoskeletal: Negative.   Neurological: Positive for sensory change. Negative for weakness.  Psychiatric/Behavioral: Negative.  The patient is not nervous/anxious.     As per HPI. Otherwise, a complete review of systems is negative.  ONCOLOGY HISTORY: Oncology History   44. 80 year old female with stage III (T3, N1, M0) adenocarcinoma of the rectum for  preop chemoradiation 2. Starting 5-FU by continuous infusion and radiation therapy November 24, 2012 3. Patient is finishing up radiation and chemotherapy on December 15 of 2014 4. Status postsurgery and colostomyFebruary of 2015, pT0 pNO tumor stage 0 5. Post operative adjuvant with FOLFOX march 2015 6.patient finished total 6 cycles of chemotherapy with FOLFOX on August 31, 2013. 7.  January 2 016 [name] had hernia repair     Colon cancer ([place])   11/26/2011 Initial Diagnosis    Colon cancer       PAST MEDICAL HISTORY: Past Medical History:  Diagnosis Date  . Colon cancer ([place])   . History of shingles    twice  . Hypercholesteremia   . Hypertension   . Left carotid bruit   . Nephrolithiasis   . Neuropathy ([place])   . Urine incontinence 03/06/2013   h/o    PAST SURGICAL HISTORY: Past Surgical History:  Procedure Laterality Date  . APPENDECTOMY  1979   colorectal  . CESAREAN SECTION    . COLONOSCOPY WITH PROPOFOL N/A 03/21/2015   Procedure: COLONOSCOPY WITH PROPOFOL;  Surgeon: Hulen Luster, MD;  Location: [facility] ENDOSCOPY;  Service: Gastroenterology;  Laterality: N/A;  . COLOSTOMY    . surgery for colorectal cancer    . VENTRAL HERNIA REPAIR Right [phone]    FAMILY HISTORY Family History  Problem Relation Age of Onset  . Hypertension Mother   . Hypertension Father   . Breast cancer Paternal Aunt     GYNECOLOGIC HISTORY:  No LMP recorded. Patient is postmenopausal.     ADVANCED DIRECTIVES:    HEALTH MAINTENANCE: Social History  Substance Use Topics  . Smoking status: Never Smoker  . Smokeless tobacco: Never Used  . Alcohol use No     No Known Allergies  Current Outpatient  Prescriptions  Medication Sig Dispense Refill  . alendronate (FOSAMAX) 70 MG tablet Take 1 tablet (70 mg total) by mouth every 7 (seven) days. Take with a full glass of water on an empty stomach. 4 tablet 11  . aspirin EC 81 MG tablet Take 81 mg by mouth daily.    Marland Kitchen b complex vitamins tablet Take 1 tablet by mouth daily.    . calcium citrate-vitamin D (CITRACAL+D) 315-200  MG-UNIT per tablet Take 1 tablet by mouth 2 (two) times daily.    Marland Kitchen glucosamine-chondroitin 500-400 MG tablet Take 1 tablet by mouth 2 (two) times daily.    Marland Kitchen loratadine (CLARITIN) 10 MG tablet Take 10 mg by mouth daily as needed.     Marland Kitchen losartan-hydrochlorothiazide (HYZAAR) 50-12.5 MG tablet Take 1 tablet by mouth daily. 90 tablet 1  . lovastatin (MEVACOR) 20 MG tablet Take 1 tablet (20 mg total) by mouth at bedtime. (Patient taking differently: Take 10 mg by mouth. Take half of 38m tablet daily) 90 tablet 3  . meloxicam (MOBIC) 7.5 MG tablet Take 7.5 mg by mouth daily.   0  . Multiple Vitamin (MULTI-VITAMINS) TABS Take 1 tablet by mouth daily.      No current facility-administered medications for this visit.    Facility-Administered Medications Ordered in Other Visits  Medication Dose Route Frequency Provider Last Rate Last Dose  . sodium chloride flush (NS) 0.9 % injection 10 mL  10 mL Intravenous PRN JForest Gleason MD   10 mL at 05/31/15 1040  . sodium chloride flush (NS) 0.9 % injection 10 mL  10 mL Intravenous PRN JForest Gleason MD   10 mL at 07/12/15 1018    OBJECTIVE: BP (!) 155/74 (BP Location: Left Arm, Patient Position: Sitting)   Pulse 76   Temp 97.1 F (36.2 C) (Tympanic)   Resp 18   Wt 153 lb (69.4 kg)   BMI 27.54 kg/m    Body mass index is 27.54 kg/m.    ECOG FS:0 - Asymptomatic  General: Well-developed, well-nourished, no acute distress. Eyes: Pink conjunctiva, anicteric sclera. HEENT: Normocephalic, moist mucous membranes, clear oropharnyx. Lungs: Clear to auscultation bilaterally. Heart: Regular rate and rhythm. No rubs, murmurs, or gallops. Abdomen: Soft, nontender, nondistended. No organomegaly noted, normoactive bowel sounds. Colostomy in place and functioning. Musculoskeletal: No edema, cyanosis, or clubbing. Neuro: Alert, answering all questions appropriately. Cranial nerves grossly intact. Skin: No rashes or petechiae noted. Psych: Normal affect. Lymphatics:  No cervical, clavicular, or axillary LAD.   LAB RESULTS:  Appointment on 01/11/2016  Component Date Value Ref Range Status  . WBC 01/11/2016 7.4  3.6 - 11.0 K/uL Final  . RBC 01/11/2016 3.84  3.80 - 5.20 MIL/uL Final  . Hemoglobin 01/11/2016 13.6  12.0 - 16.0 g/dL Final  . HCT 01/11/2016 39.0  35.0 - 47.0 % Final  . MCV 01/11/2016 101.5* 80.0 - 100.0 fL Final  . MCH 01/11/2016 35.4* 26.0 - 34.0 pg Final  . MCHC 01/11/2016 34.9  32.0 - 36.0 g/dL Final  . RDW 01/11/2016 12.9  11.5 - 14.5 % Final  . Platelets 01/11/2016 319  150 - 440 K/uL Final  . Neutrophils Relative % 01/11/2016 77  % Final  . Neutro Abs 01/11/2016 5.8  1.4 - 6.5 K/uL Final  . Lymphocytes Relative 01/11/2016 12  % Final  . Lymphs Abs 01/11/2016 0.9* 1.0 - 3.6 K/uL Final  . Monocytes Relative 01/11/2016 9  % Final  . Monocytes Absolute 01/11/2016 0.6  0.2 - 0.9 K/uL  Final  . Eosinophils Relative 01/11/2016 1  % Final  . Eosinophils Absolute 01/11/2016 0.1  0 - 0.7 K/uL Final  . Basophils Relative 01/11/2016 1  % Final  . Basophils Absolute 01/11/2016 0.1  0 - 0.1 K/uL Final  . Sodium 01/11/2016 139  135 - 145 mmol/L Final  . Potassium 01/11/2016 4.9  3.5 - 5.1 mmol/L Final  . Chloride 01/11/2016 101  101 - 111 mmol/L Final  . CO2 01/11/2016 30  22 - 32 mmol/L Final  . Glucose, Bld 01/11/2016 145* 65 - 99 mg/dL Final  . BUN 01/11/2016 22* 6 - 20 mg/dL Final  . Creatinine, Ser 01/11/2016 0.89  0.44 - 1.00 mg/dL Final  . Calcium 01/11/2016 9.3  8.9 - 10.3 mg/dL Final  . Total Protein 01/11/2016 7.1  6.5 - 8.1 g/dL Final  . Albumin 01/11/2016 4.0  3.5 - 5.0 g/dL Final  . AST 01/11/2016 28  15 - 41 U/L Final  . ALT 01/11/2016 26  14 - 54 U/L Final  . Alkaline Phosphatase 01/11/2016 61  38 - 126 U/L Final  . Total Bilirubin 01/11/2016 0.8  0.3 - 1.2 mg/dL Final  . GFR calc non Af Amer 01/11/2016 >60  >60 mL/min Final  . GFR calc Af Amer 01/11/2016 >60  >60 mL/min Final   Comment: (NOTE) The eGFR has been  calculated using the CKD EPI equation. This calculation has not been validated in all clinical situations. eGFR's persistently <60 mL/min signify possible Chronic Kidney Disease.   . Anion gap 01/11/2016 8  5 - 15 Final   Lab Results  Component Value Date   CEA 2.3 01/11/2016    STUDIES: No results found.  ASSESSMENT:  Adenocarcinoma of the rectum, stage III, T3 N1 M0.  PLAN:    1. Rectal cancer: Clinically there is no evidence of recurrent or progressive disease. Patient's most recent colonoscopy was in February 2017 and reported as normal, her next colonoscopy will be due in 5 years. Port-A-Cath removed in September by Dr. Lavone Neri. 2. Lymphedema in the left lower extremity: Continue compression stockings as instructed by lymphedema clinic. Per patient. swelling his much better. 3. Peripheral neuropathy. Related to use of chemotherapy agents. Stopped Lyrica due to making her head feel "fuzzy". Currently not taking anything. Neuropathy is tolerable per patient.   Patient will continue with routine follow-up in approximately 6 months.  Patient expressed understanding and was in agreement with this plan. She also understands that She can call clinic at any time with any questions, concerns, or complaints.   Faythe Casa, NP 01/11/2016 01:00 PM  Patient was seen and evaluated independently and I agree with the assessment and plan as dictated above. CEA continues to be within normal limits. Patient completed 6 cycles of adjuvant FOLFOX in August 2015.  Lloyd Huger, MD 01/14/16 10:30 AM

## 2016-01-11 ENCOUNTER — Inpatient Hospital Stay (HOSPITAL_BASED_OUTPATIENT_CLINIC_OR_DEPARTMENT_OTHER): Payer: Medicare Other | Admitting: Oncology

## 2016-01-11 ENCOUNTER — Inpatient Hospital Stay: Payer: Medicare Other | Attending: Hematology and Oncology

## 2016-01-11 ENCOUNTER — Inpatient Hospital Stay: Payer: Medicare Other

## 2016-01-11 VITALS — BP 155/74 | HR 76 | Temp 97.1°F | Resp 18 | Wt 153.0 lb

## 2016-01-11 DIAGNOSIS — Z933 Colostomy status: Secondary | ICD-10-CM | POA: Diagnosis not present

## 2016-01-11 DIAGNOSIS — Z87442 Personal history of urinary calculi: Secondary | ICD-10-CM | POA: Insufficient documentation

## 2016-01-11 DIAGNOSIS — Z9049 Acquired absence of other specified parts of digestive tract: Secondary | ICD-10-CM | POA: Insufficient documentation

## 2016-01-11 DIAGNOSIS — Z85048 Personal history of other malignant neoplasm of rectum, rectosigmoid junction, and anus: Secondary | ICD-10-CM | POA: Diagnosis not present

## 2016-01-11 DIAGNOSIS — Z8619 Personal history of other infectious and parasitic diseases: Secondary | ICD-10-CM

## 2016-01-11 DIAGNOSIS — Z79899 Other long term (current) drug therapy: Secondary | ICD-10-CM

## 2016-01-11 DIAGNOSIS — G629 Polyneuropathy, unspecified: Secondary | ICD-10-CM

## 2016-01-11 DIAGNOSIS — Z9221 Personal history of antineoplastic chemotherapy: Secondary | ICD-10-CM | POA: Diagnosis not present

## 2016-01-11 DIAGNOSIS — E7800 Pure hypercholesterolemia, unspecified: Secondary | ICD-10-CM | POA: Insufficient documentation

## 2016-01-11 DIAGNOSIS — I10 Essential (primary) hypertension: Secondary | ICD-10-CM | POA: Insufficient documentation

## 2016-01-11 DIAGNOSIS — C20 Malignant neoplasm of rectum: Secondary | ICD-10-CM

## 2016-01-11 DIAGNOSIS — Z803 Family history of malignant neoplasm of breast: Secondary | ICD-10-CM | POA: Insufficient documentation

## 2016-01-11 DIAGNOSIS — Z7982 Long term (current) use of aspirin: Secondary | ICD-10-CM | POA: Diagnosis not present

## 2016-01-11 DIAGNOSIS — I890 Lymphedema, not elsewhere classified: Secondary | ICD-10-CM | POA: Diagnosis not present

## 2016-01-11 DIAGNOSIS — R011 Cardiac murmur, unspecified: Secondary | ICD-10-CM | POA: Insufficient documentation

## 2016-01-11 DIAGNOSIS — Z923 Personal history of irradiation: Secondary | ICD-10-CM | POA: Diagnosis not present

## 2016-01-11 DIAGNOSIS — C189 Malignant neoplasm of colon, unspecified: Secondary | ICD-10-CM

## 2016-01-11 LAB — CBC WITH DIFFERENTIAL/PLATELET
BASOS ABS: 0.1 10*3/uL (ref 0–0.1)
BASOS PCT: 1 %
EOS PCT: 1 %
Eosinophils Absolute: 0.1 10*3/uL (ref 0–0.7)
HCT: 39 % (ref 35.0–47.0)
Hemoglobin: 13.6 g/dL (ref 12.0–16.0)
LYMPHS PCT: 12 %
Lymphs Abs: 0.9 10*3/uL — ABNORMAL LOW (ref 1.0–3.6)
MCH: 35.4 pg — ABNORMAL HIGH (ref 26.0–34.0)
MCHC: 34.9 g/dL (ref 32.0–36.0)
MCV: 101.5 fL — AB (ref 80.0–100.0)
Monocytes Absolute: 0.6 10*3/uL (ref 0.2–0.9)
Monocytes Relative: 9 %
Neutro Abs: 5.8 10*3/uL (ref 1.4–6.5)
Neutrophils Relative %: 77 %
PLATELETS: 319 10*3/uL (ref 150–440)
RBC: 3.84 MIL/uL (ref 3.80–5.20)
RDW: 12.9 % (ref 11.5–14.5)
WBC: 7.4 10*3/uL (ref 3.6–11.0)

## 2016-01-11 LAB — COMPREHENSIVE METABOLIC PANEL
ALBUMIN: 4 g/dL (ref 3.5–5.0)
ALT: 26 U/L (ref 14–54)
AST: 28 U/L (ref 15–41)
Alkaline Phosphatase: 61 U/L (ref 38–126)
Anion gap: 8 (ref 5–15)
BUN: 22 mg/dL — AB (ref 6–20)
CO2: 30 mmol/L (ref 22–32)
CREATININE: 0.89 mg/dL (ref 0.44–1.00)
Calcium: 9.3 mg/dL (ref 8.9–10.3)
Chloride: 101 mmol/L (ref 101–111)
GFR calc Af Amer: >60 mL/min (ref >60)
GLUCOSE: 145 mg/dL — AB (ref 65–99)
Potassium: 4.9 mmol/L (ref 3.5–5.1)
SODIUM: 139 mmol/L (ref 135–145)
Total Bilirubin: 0.8 mg/dL (ref 0.3–1.2)
Total Protein: 7.1 g/dL (ref 6.5–8.1)

## 2016-01-11 NOTE — Progress Notes (Signed)
Offers no complaints. Feeling well. 

## 2016-01-12 LAB — CEA: CEA: 2.3 ng/mL (ref 0.0–4.7)

## 2016-01-17 DIAGNOSIS — Z933 Colostomy status: Secondary | ICD-10-CM | POA: Diagnosis not present

## 2016-01-17 DIAGNOSIS — Z85038 Personal history of other malignant neoplasm of large intestine: Secondary | ICD-10-CM | POA: Diagnosis not present

## 2016-01-26 ENCOUNTER — Other Ambulatory Visit: Payer: Self-pay | Admitting: Surgical

## 2016-01-26 MED ORDER — LOVASTATIN 20 MG PO TABS: 20.0000 mg | ORAL_TABLET | Freq: Every day | ORAL | 1 refills | Status: DC

## 2016-03-07 ENCOUNTER — Ambulatory Visit (INDEPENDENT_AMBULATORY_CARE_PROVIDER_SITE_OTHER): Payer: Medicare Other

## 2016-03-07 VITALS — BP 124/64 | HR 62 | Temp 97.8°F | Resp 12 | Ht 63.0 in | Wt 158.0 lb

## 2016-03-07 DIAGNOSIS — Z0000 Encounter for general adult medical examination without abnormal findings: Secondary | ICD-10-CM | POA: Diagnosis not present

## 2016-03-07 NOTE — Patient Instructions (Addendum)
  Ms. Sherlin , Thank you for taking time to come for your Medicare Wellness Visit. I appreciate your ongoing commitment to your health goals. Please review the following plan we discussed and let me know if I can assist you in the future.   Follow up with Dr. Nicki Reaper as needed.  These are the goals we discussed: Goals    . Healthy Lifestyle          Stay hydrated and drink plenty of water Stay active and continue walking for exercise Eat 5-6 small meals, portion control       This is a list of the screening recommended for you and due dates:  Health Maintenance  Topic Date Due  . Complete foot exam   09/19/1947  . Tetanus Vaccine  09/18/1956  . Pneumonia vaccines (2 of 2 - PCV13) 01/29/2005  . Shingles Vaccine  06/08/2016*  . Hemoglobin A1C  05/14/2016  . Eye exam for diabetics  11/08/2016  . Mammogram  12/07/2016  . Colon Cancer Screening  03/20/2020  . Flu Shot  Completed  . DEXA scan (bone density measurement)  Completed  *Topic was postponed. The date shown is not the original due date.

## 2016-03-07 NOTE — Progress Notes (Addendum)
Subjective:   Fronnie Scheulen is a 81 y.o. female who presents for an Initial Medicare Annual Wellness Visit.  Review of Systems    No ROS.  Medicare Wellness Visit.  Cardiac Risk Factors include: advanced age (>33men, >75 women);diabetes mellitus;hypertension     Objective:    Today's Vitals   03/07/16 0911  BP: 124/64  Pulse: 62  Resp: 12  Temp: 97.8 F (36.6 C)  TempSrc: Oral  SpO2: 98%  Weight: 158 lb (71.7 kg)  Height: 5\' 3"  (1.6 m)   Body mass index is 27.99 kg/m.   Current Medications (verified) Outpatient Encounter Prescriptions as of 03/07/2016  Medication Sig  . alendronate (FOSAMAX) 70 MG tablet Take 1 tablet (70 mg total) by mouth every 7 (seven) days. Take with a full glass of water on an empty stomach.  Marland Kitchen aspirin EC 81 MG tablet Take 81 mg by mouth daily.  Marland Kitchen b complex vitamins tablet Take 1 tablet by mouth daily.  . B Complex-C (SUPER B COMPLEX PO) Take 1 tablet by mouth daily.  . calcium citrate-vitamin D (CITRACAL+D) 315-200 MG-UNIT per tablet Take 1 tablet by mouth 2 (two) times daily.  . Cholecalciferol (VITAMIN D3) 5000 units CAPS Take 1 capsule by mouth daily.  Marland Kitchen glucosamine-chondroitin 500-400 MG tablet Take 1 tablet by mouth 2 (two) times daily.  Marland Kitchen loratadine (CLARITIN) 10 MG tablet Take 10 mg by mouth daily as needed.   Marland Kitchen losartan-hydrochlorothiazide (HYZAAR) 50-12.5 MG tablet Take 1 tablet by mouth daily.  Marland Kitchen lovastatin (MEVACOR) 20 MG tablet Take 1 tablet (20 mg total) by mouth at bedtime.  . meloxicam (MOBIC) 7.5 MG tablet Take 7.5 mg by mouth daily.   . Multiple Vitamin (MULTI-VITAMINS) TABS Take 1 tablet by mouth daily.    Facility-Administered Encounter Medications as of 03/07/2016  Medication  . sodium chloride flush (NS) 0.9 % injection 10 mL  . sodium chloride flush (NS) 0.9 % injection 10 mL    Allergies (verified) Patient has no known allergies.   History: Past Medical History:  Diagnosis Date  . Colon cancer ([place])   . History of  shingles    twice  . Hypercholesteremia   . Hypertension   . Left carotid bruit   . Nephrolithiasis   . Neuropathy ([place])   . Urine incontinence 03/06/2013   h/o   Past Surgical History:  Procedure Laterality Date  . APPENDECTOMY  1979   colorectal  . CESAREAN SECTION    . COLONOSCOPY WITH PROPOFOL N/A 03/21/2015   Procedure: COLONOSCOPY WITH PROPOFOL;  Surgeon: Hulen Luster, MD;  Location: [facility] ENDOSCOPY;  Service: Gastroenterology;  Laterality: N/A;  . COLOSTOMY    . surgery for colorectal cancer    . VENTRAL HERNIA REPAIR Right [id number]   Family History  Problem Relation Age of Onset  . Hypertension Mother   . Hypertension Father   . Breast cancer Paternal Aunt    Social History   Occupational History  . Not on file.   Social History Main Topics  . Smoking status: Never Smoker  . Smokeless tobacco: Never Used  . Alcohol use No  . Drug use: No  . Sexual activity: No    Tobacco Counseling Counseling given: Not Answered   Activities of Daily Living In your present state of health, do you have any difficulty performing the following activities: 03/07/2016  Hearing? N  Vision? N  Difficulty concentrating or making decisions? N  Walking or climbing stairs? N  Dressing or bathing?  N  Doing errands, shopping? N  Preparing Food and eating ? N  Using the Toilet? N  In the past six months, have you accidently leaked urine? Y  Do you have problems with loss of bowel control? N  Managing your Medications? N  Managing your Finances? N  Housekeeping or managing your Housekeeping? N  Some recent data might be hidden    Immunizations and Health Maintenance Immunization History  Administered Date(s) Administered  . Influenza, High Dose Seasonal PF 10/18/2015  . Influenza,inj,Quad PF,36+ Mos 10/12/2014  . Influenza-Unspecified 11/30/2013  . Pneumococcal Polysaccharide-23 01/30/2004   Health Maintenance Due  Topic Date Due  . FOOT EXAM  09/19/1947  . TETANUS/TDAP   09/18/1956  . PNA vac Low Risk Adult (2 of 2 - PCV13) 01/29/2005    Patient Care Team: Einar Pheasant, MD as PCP - General (Internal Medicine) Forest Gleason, MD (Oncology) Wallene Huh, DPM as Consulting Physician (Podiatry)  Indicate any recent Medical Services you may have received from other than [facility] providers in the past year (date may be approximate).     Assessment:   This is a routine wellness examination for Niloufar. The goal of the wellness visit is to assist the patient how to close the gaps in care and create a preventative care plan for the patient.   Taking calcium VIT D3 as appropriate/Osteoporosis risk reviewed.  Medications reviewed; taking without issues or barriers.  Safety issues reviewed; smoke detectors in the home. No firearms in the home. Wears seatbelts when driving or riding with others. No violence in the home.  No identified risk were noted; The patient was oriented x 3; appropriate in dress and manner and no objective failures at ADL's or IADL's.   BMI; discussed the importance of a healthy diet, water intake and exercise. Educational material provided.  HTN; followed by PCP.  TDAP vaccine deferred for follow up with insurance.  Educational material provided.  Patient Concerns: None at this time. Follow up with PCP as needed.  Hearing/Vision screen Hearing Screening Comments: Patient passes the whisper test Vision Screening Comments: Followed by [facility] Wears glasses Last 10/2015 Visual acuity not assessed per patient preference since she has regular follow up with her ophthalmologist  Dietary issues and exercise activities discussed: Current Exercise Habits: Home exercise routine (Chair exercises), Type of exercise: walking, Time (Minutes): 20, Frequency (Times/Week): 3, Weekly Exercise (Minutes/Week): 60, Intensity: Mild  Goals    . Healthy Lifestyle          Stay hydrated and drink plenty of water Stay active and  continue walking for exercise Eat 5-6 small meals, portion control      Depression Screen PHQ 2/9 Scores 03/07/2016 10/18/2015 06/09/2014 03/04/2014  PHQ - 2 Score 0 0 0 0    Fall Risk Fall Risk  03/07/2016 10/18/2015 06/09/2014 03/04/2014  Falls in the past year? Yes Yes No Yes  Number falls in past yr: 1 1 - 1  Injury with Fall? No No - No  Follow up Falls prevention discussed - - -    Cognitive Function:     6CIT Screen 03/07/2016  What Year? 0 points  What month? 0 points  What time? 0 points  Count back from 20 0 points  Months in reverse 0 points  Repeat phrase 0 points  Total Score 0    Screening Tests Health Maintenance  Topic Date Due  . FOOT EXAM  09/19/1947  . TETANUS/TDAP  09/18/1956  . PNA vac  Low Risk Adult (2 of 2 - PCV13) 01/29/2005  . ZOSTAVAX  06/08/2016 (Originally 09/18/1997)  . HEMOGLOBIN A1C  05/14/2016  . OPHTHALMOLOGY EXAM  11/08/2016  . MAMMOGRAM  12/07/2016  . COLONOSCOPY  03/20/2020  . INFLUENZA VACCINE  Completed  . DEXA SCAN  Completed      Plan:    End of life planning; Advance aging; Advanced directives discussed. No HCPOA/Living Will.  Additional information provided to help her start the conversation with her family.  Copy of HCPOA/Living Will requested upon completion.  Time spent on this topic is 16 minutes.   Medicare Attestation I have personally reviewed: The patient's medical and social history Their use of alcohol, tobacco or illicit drugs Their current medications and supplements The patient's functional ability including ADLs,fall risks, home safety risks, cognitive, and hearing and visual impairment Diet and physical activities Evidence for depression   The patient's weight, height, BMI, and visual acuity have been recorded in the chart.  I have made referrals and provided education to the patient based on review of the above and I have provided the patient with a written personalized care plan for preventive services.     During the course of the visit, Coyla was educated and counseled about the following appropriate screening and preventive services:   Vaccines to include Pneumoccal, Influenza, Hepatitis B, Td, Zostavax, HCV  Electrocardiogram  Cardiovascular disease screening  Colorectal cancer screening  Bone density screening  Diabetes screening  Glaucoma screening  Mammography/PAP  Nutrition counseling  Smoking cessation counseling  Patient Instructions (the written plan) were given to the patient.    Varney Biles, LPN   [id number]    Reviewed above information.  Agree with plan.  Dr Nicki Reaper

## 2016-03-13 ENCOUNTER — Encounter: Payer: Self-pay | Admitting: Internal Medicine

## 2016-03-13 ENCOUNTER — Ambulatory Visit (INDEPENDENT_AMBULATORY_CARE_PROVIDER_SITE_OTHER): Payer: Medicare Other | Admitting: Internal Medicine

## 2016-03-13 DIAGNOSIS — E7800 Pure hypercholesterolemia, unspecified: Secondary | ICD-10-CM

## 2016-03-13 DIAGNOSIS — C20 Malignant neoplasm of rectum: Secondary | ICD-10-CM | POA: Diagnosis not present

## 2016-03-13 DIAGNOSIS — R0989 Other specified symptoms and signs involving the circulatory and respiratory systems: Secondary | ICD-10-CM

## 2016-03-13 DIAGNOSIS — I10 Essential (primary) hypertension: Secondary | ICD-10-CM

## 2016-03-13 DIAGNOSIS — E119 Type 2 diabetes mellitus without complications: Secondary | ICD-10-CM | POA: Diagnosis not present

## 2016-03-13 DIAGNOSIS — G629 Polyneuropathy, unspecified: Secondary | ICD-10-CM | POA: Diagnosis not present

## 2016-03-13 NOTE — Progress Notes (Signed)
[name], female   DOB: 1935-10-09, 81 y.o.   MRN: [id number]   Subjective:    [name], female    DOB: [birth year], 81 y.o.   MRN: [id number]  HPI  Patient here for a scheduled follow up.  She has a history of rectal cancer s/p colectomy with colostomy.  Just saw Dr Grayland Ormond.  Felt things were stable.  Last colonoscopy 2017. Recommended f/u in 5 years.  Tries to stay active.  No chest pain.  No sob.  No acid reflux.  No abdominal pain or cramping.  Bowels stable.  Trying to watch her diet.     Past Medical History:  Diagnosis Date  . Colon cancer ([place])   . History of shingles    twice  . Hypercholesteremia   . Hypertension   . Left carotid bruit   . Nephrolithiasis   . Neuropathy ([place])   . Urine incontinence 03/06/2013   h/o   Past Surgical History:  Procedure Laterality Date  . APPENDECTOMY  1979   colorectal  . CESAREAN SECTION    . COLONOSCOPY WITH PROPOFOL N/A 03/21/2015   Procedure: COLONOSCOPY WITH PROPOFOL;  Surgeon: Hulen Luster, MD;  Location: [facility] ENDOSCOPY;  Service: Gastroenterology;  Laterality: N/A;  . COLOSTOMY    . surgery for colorectal cancer    . VENTRAL HERNIA REPAIR Right [phone]   Family History  Problem Relation Age of Onset  . Hypertension Mother   . Hypertension Father   . Breast cancer Paternal Aunt    Social History   Social History  . Marital status: Widowed    Spouse name: N/A  . Number of children: N/A  . Years of education: N/A   Social History Main Topics  . Smoking status: Never Smoker  . Smokeless tobacco: Never Used  . Alcohol use No  . Drug use: No  . Sexual activity: No   Other Topics Concern  . None   Social History Narrative  . None    Outpatient Encounter Prescriptions as of 03/13/2016  Medication Sig  . alendronate (FOSAMAX) 70 MG tablet Take 1 tablet (70 mg total) by mouth every 7 (seven) days. Take with a full glass of water on an empty stomach.  Marland Kitchen aspirin EC 81 MG tablet Take 81  mg by mouth daily.  . B Complex-C (SUPER B COMPLEX PO) Take 1 tablet by mouth daily.  . calcium citrate-vitamin D (CITRACAL+D) 315-200 MG-UNIT per tablet Take 1 tablet by mouth 2 (two) times daily.  . Cholecalciferol (VITAMIN D3) 5000 units CAPS Take 1 capsule by mouth daily.  Marland Kitchen glucosamine-chondroitin 500-400 MG tablet Take 1 tablet by mouth 2 (two) times daily.  Marland Kitchen loratadine (CLARITIN) 10 MG tablet Take 10 mg by mouth daily as needed.   Marland Kitchen losartan-hydrochlorothiazide (HYZAAR) 50-12.5 MG tablet Take 1 tablet by mouth daily.  Marland Kitchen lovastatin (MEVACOR) 20 MG tablet Take 1 tablet (20 mg total) by mouth at bedtime.  . Multiple Vitamin (MULTI-VITAMINS) TABS Take 1 tablet by mouth daily.   . [DISCONTINUED] b complex vitamins tablet Take 1 tablet by mouth daily.  . [DISCONTINUED] meloxicam (MOBIC) 7.5 MG tablet Take 7.5 mg by mouth daily.    Facility-Administered Encounter Medications as of 03/13/2016  Medication  . sodium chloride flush (NS) 0.9 % injection 10 mL  . sodium chloride flush (NS) 0.9 % injection 10 mL    Review of Systems  Constitutional: Negative for appetite change and unexpected weight change.  HENT: Negative  for congestion and sinus pressure.   Respiratory: Negative for cough, chest tightness and shortness of breath.   Cardiovascular: Negative for chest pain, palpitations and leg swelling.  Gastrointestinal: Negative for abdominal pain, diarrhea, nausea and vomiting.  Genitourinary: Negative for difficulty urinating and dysuria.  Musculoskeletal: Negative for back pain and joint swelling.  Skin: Negative for color change and rash.  Neurological: Negative for dizziness, light-headedness and headaches.  Psychiatric/Behavioral: Negative for agitation and dysphoric mood.       Objective:    Physical Exam  Constitutional: She appears well-developed and well-nourished. No distress.  HENT:  Nose: Nose normal.  Mouth/Throat: Oropharynx is clear and moist.  Neck: Neck supple. No  thyromegaly present.  Cardiovascular: Normal rate and regular rhythm.   Pulmonary/Chest: Breath sounds normal. No respiratory distress. She has no wheezes.  Abdominal: Soft. Bowel sounds are normal. There is no tenderness.  Musculoskeletal: She exhibits no edema or tenderness.  Lymphadenopathy:    She has no cervical adenopathy.  Skin: No rash noted. No erythema.  Psychiatric: She has a normal mood and affect. Her behavior is normal.    BP 120/62 (BP Location: Left Arm, Patient Position: Sitting, Cuff Size: Large)   Pulse 67   Temp 97.8 F (36.6 C) (Oral)   Resp 18   Ht _0  (1.6 m)   Wt 157 lb (71.2 kg)   BMI 27.81 kg/m  Wt Readings from Last 3 Encounters:  03/13/16 157 lb (71.2 kg)  03/07/16 158 lb (71.7 kg)  01/11/16 153 lb (69.4 kg)     Lab Results  Component Value Date   WBC 7.4 01/11/2016   HGB 13.6 01/11/2016   HCT 39.0 01/11/2016   PLT 319 01/11/2016   GLUCOSE 145 (H) 01/11/2016   CHOL 103 11/14/2015   TRIG 71.0 11/14/2015   HDL 38.10 (L) 11/14/2015   LDLCALC 51 11/14/2015   ALT 26 01/11/2016   AST 28 01/11/2016   NA 139 01/11/2016   K 4.9 01/11/2016   CL 101 01/11/2016   CREATININE 0.89 01/11/2016   BUN 22 (H) 01/11/2016   CO2 30 01/11/2016   TSH 2.88 02/15/2015   INR 1.1 10/21/2012   HGBA1C 5.9 11/14/2015   MICROALBUR 7.5 (H) 10/18/2015    Mm Screening Breast Tomo Bilateral  Result Date: 12/08/2015 CLINICAL DATA:  Screening. EXAM: 2D DIGITAL SCREENING BILATERAL MAMMOGRAM WITH CAD AND ADJUNCT TOMO COMPARISON:  Previous exam(s). ACR Breast Density Category c: The breast tissue is heterogeneously dense, which may obscure small masses. FINDINGS: There are no findings suspicious for malignancy. Images were processed with CAD. IMPRESSION: No mammographic evidence of malignancy. A result letter of this screening mammogram will be mailed directly to the patient. RECOMMENDATION: Screening mammogram in one year. (Code:SM-B-01Y) BI-RADS CATEGORY  1: Negative.  Electronically Signed   By: Dorise Bullion III M.D   On: 12/08/2015 15:54       Assessment & Plan:   Problem List Items Addressed This Visit    Essential hypertension    Blood pressure under good control.  Continue same medication regimen.  Follow pressures.  Follow metabolic panel.        Relevant Orders   TSH   Basic metabolic panel   Hypercholesterolemia    On mevacor.  Low cholesterol diet and exercise.  Follow lipid panel and liver function tests.        Relevant Orders   Lipid panel   Hepatic function panel   Left carotid bruit    Continue  lovastatin and risk factor modification.  Saw vascular surgery.        Malignant neoplasm of rectum (HCC)    H/p rectal cancer.  S/p chemo and XRT and colectomy.  Had colonoscopy 03/2015.  Recommended f/u in 5 years.        Polyneuropathy ([place])    On no medication now.  Follow.       Type 2 diabetes mellitus (HCC)    Low carb diet and exercise.  Follow met b and a1c.  Keep up to date with eye checks.        Relevant Orders   Hemoglobin A1c       Einar Pheasant, MD

## 2016-03-13 NOTE — Progress Notes (Signed)
Pre-visit discussion using our clinic review tool. No additional management support is needed unless otherwise documented below in the visit note.  

## 2016-03-25 ENCOUNTER — Encounter: Payer: Self-pay | Admitting: Internal Medicine

## 2016-03-25 NOTE — Assessment & Plan Note (Signed)
On mevacor.  Low cholesterol diet and exercise.  Follow lipid panel and liver function tests.  

## 2016-03-25 NOTE — Assessment & Plan Note (Signed)
On no medication now.  Follow.

## 2016-03-25 NOTE — Assessment & Plan Note (Signed)
Blood pressure under good control.  Continue same medication regimen.  Follow pressures.  Follow metabolic panel.   

## 2016-03-25 NOTE — Assessment & Plan Note (Signed)
Continue lovastatin and risk factor modification.  Saw vascular surgery.

## 2016-03-25 NOTE — Assessment & Plan Note (Signed)
Low carb diet and exercise.  Follow met b and a1c.  Keep up to date with eye checks.

## 2016-03-25 NOTE — Assessment & Plan Note (Signed)
H/p rectal cancer.  S/p chemo and XRT and colectomy.  Had colonoscopy 03/2015.  Recommended f/u in 5 years.

## 2016-03-28 DIAGNOSIS — Z85048 Personal history of other malignant neoplasm of rectum, rectosigmoid junction, and anus: Secondary | ICD-10-CM | POA: Diagnosis not present

## 2016-04-13 ENCOUNTER — Other Ambulatory Visit: Payer: Self-pay

## 2016-04-13 DIAGNOSIS — I10 Essential (primary) hypertension: Secondary | ICD-10-CM

## 2016-04-13 MED ORDER — LOSARTAN POTASSIUM-HCTZ 50-12.5 MG PO TABS: 1.0000 | ORAL_TABLET | Freq: Every day | ORAL | 1 refills | Status: DC

## 2016-04-19 ENCOUNTER — Telehealth: Payer: Self-pay | Admitting: Internal Medicine

## 2016-04-19 NOTE — Telephone Encounter (Signed)
Pt had lvm asking for receptionist to call her back. I called back and had to lvm to return our call.

## 2016-04-25 DIAGNOSIS — Z933 Colostomy status: Secondary | ICD-10-CM | POA: Diagnosis not present

## 2016-04-25 DIAGNOSIS — Z85038 Personal history of other malignant neoplasm of large intestine: Secondary | ICD-10-CM | POA: Diagnosis not present

## 2016-05-25 DIAGNOSIS — D225 Melanocytic nevi of trunk: Secondary | ICD-10-CM | POA: Diagnosis not present

## 2016-05-25 DIAGNOSIS — D2262 Melanocytic nevi of left upper limb, including shoulder: Secondary | ICD-10-CM | POA: Diagnosis not present

## 2016-05-25 DIAGNOSIS — D2261 Melanocytic nevi of right upper limb, including shoulder: Secondary | ICD-10-CM | POA: Diagnosis not present

## 2016-05-25 DIAGNOSIS — L820 Inflamed seborrheic keratosis: Secondary | ICD-10-CM | POA: Diagnosis not present

## 2016-05-25 DIAGNOSIS — L821 Other seborrheic keratosis: Secondary | ICD-10-CM | POA: Diagnosis not present

## 2016-06-04 ENCOUNTER — Other Ambulatory Visit (INDEPENDENT_AMBULATORY_CARE_PROVIDER_SITE_OTHER): Payer: Medicare Other

## 2016-06-04 DIAGNOSIS — E119 Type 2 diabetes mellitus without complications: Secondary | ICD-10-CM | POA: Diagnosis not present

## 2016-06-04 DIAGNOSIS — E7800 Pure hypercholesterolemia, unspecified: Secondary | ICD-10-CM | POA: Diagnosis not present

## 2016-06-04 DIAGNOSIS — I10 Essential (primary) hypertension: Secondary | ICD-10-CM | POA: Diagnosis not present

## 2016-06-04 LAB — LIPID PANEL
Cholesterol: 109 mg/dL (ref 0–200)
HDL: 36.6 mg/dL — ABNORMAL LOW (ref >39.00)
LDL Cholesterol: 53 mg/dL (ref 0–99)
NonHDL: 72.31
TRIGLYCERIDES: 96 mg/dL (ref 0.0–149.0)
Total CHOL/HDL Ratio: 3
VLDL: 19.2 mg/dL (ref 0.0–40.0)

## 2016-06-04 LAB — BASIC METABOLIC PANEL
BUN: 22 mg/dL (ref 6–23)
CO2: 32 mEq/L (ref 19–32)
Calcium: 9.3 mg/dL (ref 8.4–10.5)
Chloride: 105 mEq/L (ref 96–112)
Creatinine, Ser: 0.99 mg/dL (ref 0.40–1.20)
GFR: 57.55 mL/min — AB (ref >60.00)
Glucose, Bld: 124 mg/dL — ABNORMAL HIGH (ref 70–99)
POTASSIUM: 4.9 meq/L (ref 3.5–5.1)
SODIUM: 141 meq/L (ref 135–145)

## 2016-06-04 LAB — HEPATIC FUNCTION PANEL
ALT: 19 U/L (ref 0–35)
AST: 21 U/L (ref 0–37)
Albumin: 3.8 g/dL (ref 3.5–5.2)
Alkaline Phosphatase: 45 U/L (ref 39–117)
BILIRUBIN DIRECT: 0.2 mg/dL (ref 0.0–0.3)
TOTAL PROTEIN: 6.5 g/dL (ref 6.0–8.3)
Total Bilirubin: 0.7 mg/dL (ref 0.2–1.2)

## 2016-06-04 LAB — HEMOGLOBIN A1C: Hgb A1c MFr Bld: 5.9 % (ref 4.6–6.5)

## 2016-06-04 LAB — TSH: TSH: 2.78 u[IU]/mL (ref 0.35–4.50)

## 2016-06-05 ENCOUNTER — Encounter: Payer: Self-pay | Admitting: Internal Medicine

## 2016-06-05 ENCOUNTER — Ambulatory Visit (INDEPENDENT_AMBULATORY_CARE_PROVIDER_SITE_OTHER): Payer: Medicare Other | Admitting: Internal Medicine

## 2016-06-05 DIAGNOSIS — I10 Essential (primary) hypertension: Secondary | ICD-10-CM | POA: Diagnosis not present

## 2016-06-05 DIAGNOSIS — E7800 Pure hypercholesterolemia, unspecified: Secondary | ICD-10-CM

## 2016-06-05 DIAGNOSIS — C189 Malignant neoplasm of colon, unspecified: Secondary | ICD-10-CM | POA: Diagnosis not present

## 2016-06-05 DIAGNOSIS — I771 Stricture of artery: Secondary | ICD-10-CM

## 2016-06-05 DIAGNOSIS — I779 Disorder of arteries and arterioles, unspecified: Secondary | ICD-10-CM

## 2016-06-05 DIAGNOSIS — E119 Type 2 diabetes mellitus without complications: Secondary | ICD-10-CM

## 2016-06-05 DIAGNOSIS — I739 Peripheral vascular disease, unspecified: Secondary | ICD-10-CM

## 2016-06-05 LAB — HM DIABETES FOOT EXAM

## 2016-06-05 NOTE — Progress Notes (Signed)
[name], female   DOB: [birth year], 81 y.o.   MRN: [id number]   Subjective:    [name], female    DOB: [birth year], 81 y.o.   MRN: [id number]  HPI  Patient here for a scheduled follow up.  Sees Dr Grayland Ormond and Dr Tamala Julian for her history of rectal cancer.  Last saw Dr Tamala Julian 03/2016.  Recommended f/u in one year.  Last colonoscopy 2017.  Recommended f/u in 5 years.  She reports she is doing well.  Feels good.  Good appetite.  No chest pain.  No sob.  No acid reflux.  No abdominal pain.  Bowels moving.  No urine change.     Past Medical History:  Diagnosis Date  . Colon cancer ([place])   . History of shingles    twice  . Hypercholesteremia   . Hypertension   . Left carotid bruit   . Nephrolithiasis   . Neuropathy   . Urine incontinence 03/06/2013   h/o   Past Surgical History:  Procedure Laterality Date  . APPENDECTOMY  1979   colorectal  . CESAREAN SECTION    . COLONOSCOPY WITH PROPOFOL N/A 03/21/2015   Procedure: COLONOSCOPY WITH PROPOFOL;  Surgeon: Hulen Luster, MD;  Location: [facility] ENDOSCOPY;  Service: Gastroenterology;  Laterality: N/A;  . COLOSTOMY    . surgery for colorectal cancer    . VENTRAL HERNIA REPAIR Right [phone]   Family History  Problem Relation Age of Onset  . Hypertension Mother   . Hypertension Father   . Breast cancer Paternal Aunt    Social History   Social History  . Marital status: Widowed    Spouse name: N/A  . Number of children: N/A  . Years of education: N/A   Social History Main Topics  . Smoking status: Never Smoker  . Smokeless tobacco: Never Used  . Alcohol use No  . Drug use: No  . Sexual activity: No   Other Topics Concern  . None   Social History Narrative  . None    Outpatient Encounter Prescriptions as of 06/05/2016  Medication Sig  . alendronate (FOSAMAX) 70 MG tablet Take 1 tablet (70 mg total) by mouth every 7 (seven) days. Take with a full glass of water on an empty stomach.  . Alpha-Lipoic  Acid 100 MG CAPS Take by mouth.  Marland Kitchen aspirin EC 81 MG tablet Take 81 mg by mouth daily.  . B Complex-C (SUPER B COMPLEX PO) Take 1 tablet by mouth daily.  . Biotin 1000 MCG tablet Take 1,000 mcg by mouth 3 (three) times daily.  . calcium citrate-vitamin D (CITRACAL+D) 315-200 MG-UNIT per tablet Take 1 tablet by mouth 2 (two) times daily.  . Cholecalciferol (VITAMIN D3) 5000 units CAPS Take 1 capsule by mouth daily.  Marland Kitchen glucosamine-chondroitin 500-400 MG tablet Take 1 tablet by mouth 2 (two) times daily.  Marland Kitchen loratadine (CLARITIN) 10 MG tablet Take 10 mg by mouth daily as needed.   Marland Kitchen losartan-hydrochlorothiazide (HYZAAR) 50-12.5 MG tablet Take 1 tablet by mouth daily.  Marland Kitchen lovastatin (MEVACOR) 20 MG tablet Take 1 tablet (20 mg total) by mouth at bedtime.  . Multiple Vitamin (MULTI-VITAMINS) TABS Take 1 tablet by mouth daily.    Facility-Administered Encounter Medications as of 06/05/2016  Medication  . sodium chloride flush (NS) 0.9 % injection 10 mL  . sodium chloride flush (NS) 0.9 % injection 10 mL    Review of Systems  Constitutional: Negative for appetite change and unexpected weight  change.  HENT: Negative for congestion and sinus pressure.   Respiratory: Negative for cough, chest tightness and shortness of breath.   Cardiovascular: Negative for chest pain, palpitations and leg swelling.  Gastrointestinal: Negative for abdominal pain, nausea and vomiting.  Genitourinary: Negative for difficulty urinating and dysuria.  Musculoskeletal: Negative for back pain and joint swelling.  Skin: Negative for color change and rash.  Neurological: Negative for dizziness, light-headedness and headaches.  Psychiatric/Behavioral: Negative for agitation and dysphoric mood.       Objective:    Physical Exam  Constitutional: She appears well-developed and well-nourished. No distress.  HENT:  Nose: Nose normal.  Mouth/Throat: Oropharynx is clear and moist.  Neck: Neck supple. No thyromegaly present.    Cardiovascular: Normal rate and regular rhythm.   Pulmonary/Chest: Breath sounds normal. No respiratory distress. She has no wheezes.  Abdominal: Soft. Bowel sounds are normal. There is no tenderness.  Musculoskeletal: She exhibits no edema or tenderness.  Foot exam - no cuts or lesions.  DP pulses palpable and equal bilaterally.    Lymphadenopathy:    She has no cervical adenopathy.  Skin: No rash noted. No erythema.  Psychiatric: She has a normal mood and affect. Her behavior is normal.    BP 130/62   Pulse 64   Temp 97.7 F (36.5 C) (Oral)   Wt 161 lb 3.2 oz (73.1 kg)   SpO2 98%   BMI 28.56 kg/m  Wt Readings from Last 3 Encounters:  06/05/16 161 lb 3.2 oz (73.1 kg)  03/13/16 157 lb (71.2 kg)  03/07/16 158 lb (71.7 kg)     Lab Results  Component Value Date   WBC 7.4 01/11/2016   HGB 13.6 01/11/2016   HCT 39.0 01/11/2016   PLT 319 01/11/2016   GLUCOSE 124 (H) 06/04/2016   CHOL 109 06/04/2016   TRIG 96.0 06/04/2016   HDL 36.60 (L) 06/04/2016   LDLCALC 53 06/04/2016   ALT 19 06/04/2016   AST 21 06/04/2016   NA 141 06/04/2016   K 4.9 06/04/2016   CL 105 06/04/2016   CREATININE 0.99 06/04/2016   BUN 22 06/04/2016   CO2 32 06/04/2016   TSH 2.78 06/04/2016   INR 1.1 10/21/2012   HGBA1C 5.9 06/04/2016   MICROALBUR 7.5 (H) 10/18/2015    Mm Screening Breast Tomo Bilateral  Result Date: 12/08/2015 CLINICAL DATA:  Screening. EXAM: 2D DIGITAL SCREENING BILATERAL MAMMOGRAM WITH CAD AND ADJUNCT TOMO COMPARISON:  Previous exam(s). ACR Breast Density Category c: The breast tissue is heterogeneously dense, which may obscure small masses. FINDINGS: There are no findings suspicious for malignancy. Images were processed with CAD. IMPRESSION: No mammographic evidence of malignancy. A result letter of this screening mammogram will be mailed directly to the patient. RECOMMENDATION: Screening mammogram in one year. (Code:SM-B-01Y) BI-RADS CATEGORY  1: Negative. Electronically Signed    By: Dorise Bullion III M.D   On: 12/08/2015 15:54       Assessment & Plan:   Problem List Items Addressed This Visit    Carotid artery disease ([place])    09/2014 - carotid ultrasound - < 50% bilaterally.  Continue risk factor modification.        Colon cancer ([place])    s/p XRT and chemo.  Colonoscopy 03/21/15 as outlined.  Follow.  Continues to be followed at the cancer center.        Essential hypertension    Blood pressure under good control.  Continue same medication regimen.  Follow pressures.  Follow metabolic  panel.        Hypercholesterolemia    On lovastatin.  Low cholesterol diet and exercise.  Follow lipid panel and liver function tests.        Relevant Orders   Hepatic function panel   Lipid panel   Subclavian arterial stenosis (HCC)    Saw vascular surgery.  Asymptomatic.        Type 2 diabetes mellitus (HCC)    Low carb diet and exercise.  Follow met b and a1c.        Relevant Orders   Hemoglobin Q7R   Basic metabolic panel   Microalbumin / creatinine urine ratio       Einar Pheasant, MD

## 2016-06-05 NOTE — Progress Notes (Signed)
Pre visit review using our clinic review tool, if applicable. No additional management support is needed unless otherwise documented below in the visit note. 

## 2016-06-10 ENCOUNTER — Encounter: Payer: Self-pay | Admitting: Internal Medicine

## 2016-06-10 NOTE — Assessment & Plan Note (Signed)
Saw vascular surgery.  Asymptomatic.

## 2016-06-10 NOTE — Assessment & Plan Note (Signed)
Low carb diet and exercise.  Follow met b and a1c.   

## 2016-06-10 NOTE — Assessment & Plan Note (Signed)
Blood pressure under good control.  Continue same medication regimen.  Follow pressures.  Follow metabolic panel.   

## 2016-06-10 NOTE — Assessment & Plan Note (Signed)
s/p XRT and chemo.  Colonoscopy 03/21/15 as outlined.  Follow.  Continues to be followed at the cancer center.

## 2016-06-10 NOTE — Assessment & Plan Note (Signed)
09/2014 - carotid ultrasound - < 50% bilaterally.  Continue risk factor modification.

## 2016-06-10 NOTE — Assessment & Plan Note (Signed)
On lovastatin.  Low cholesterol diet and exercise.  Follow lipid panel and liver function tests.   

## 2016-07-02 ENCOUNTER — Telehealth: Payer: Self-pay | Admitting: Family

## 2016-07-02 ENCOUNTER — Other Ambulatory Visit: Payer: Self-pay

## 2016-07-02 DIAGNOSIS — I779 Disorder of arteries and arterioles, unspecified: Secondary | ICD-10-CM

## 2016-07-02 DIAGNOSIS — I739 Peripheral vascular disease, unspecified: Principal | ICD-10-CM

## 2016-07-02 NOTE — Telephone Encounter (Signed)
LVM for pt to call and confirm appt

## 2016-07-03 ENCOUNTER — Encounter: Payer: Self-pay | Admitting: Family

## 2016-07-06 ENCOUNTER — Ambulatory Visit (INDEPENDENT_AMBULATORY_CARE_PROVIDER_SITE_OTHER): Payer: Medicare Other | Admitting: Family

## 2016-07-06 ENCOUNTER — Encounter: Payer: Self-pay | Admitting: Family

## 2016-07-06 ENCOUNTER — Ambulatory Visit (HOSPITAL_COMMUNITY)
Admission: RE | Admit: 2016-07-06 | Discharge: 2016-07-06 | Disposition: A | Discharge status: Home / Self Care | Payer: Medicare Other | Source: Ambulatory Visit | Attending: Family | Admitting: Family

## 2016-07-06 VITALS — BP 149/68 | HR 57 | Temp 97.3°F | Resp 20 | Ht 63.0 in | Wt 161.0 lb

## 2016-07-06 DIAGNOSIS — I739 Peripheral vascular disease, unspecified: Principal | ICD-10-CM

## 2016-07-06 DIAGNOSIS — I779 Disorder of arteries and arterioles, unspecified: Secondary | ICD-10-CM

## 2016-07-06 DIAGNOSIS — I771 Stricture of artery: Secondary | ICD-10-CM

## 2016-07-06 LAB — VAS US CAROTID
LCCADDIAS: 22 cm/s
LCCADSYS: 83 cm/s
LCCAPDIAS: 17 cm/s
LEFT ECA DIAS: -3 cm/s
LEFT VERTEBRAL DIAS: -20 cm/s
LICAPDIAS: -13 cm/s
LICAPSYS: -66 cm/s
Left CCA prox sys: 86 cm/s
RCCAPDIAS: 14 cm/s
RCCAPSYS: 86 cm/s
RIGHT CCA MID DIAS: 16 cm/s
RIGHT ECA DIAS: -4 cm/s
Right cca dist sys: -140 cm/s

## 2016-07-06 NOTE — Patient Instructions (Signed)
Stroke Prevention Some medical conditions and behaviors are associated with an increased chance of having a stroke. You may prevent a stroke by making healthy choices and managing medical conditions. How can I reduce my risk of having a stroke?  Stay physically active. Get at least 30 minutes of activity on most or all days.  Do not smoke. It may also be helpful to avoid exposure to secondhand smoke.  Limit alcohol use. Moderate alcohol use is considered to be:  No more than 2 drinks per day for men.  No more than 1 drink per day for nonpregnant women.  Eat healthy foods. This involves:  Eating 5 or more servings of fruits and vegetables a day.  Making dietary changes that address high blood pressure (hypertension), high cholesterol, diabetes, or obesity.  Manage your cholesterol levels.  Making food choices that are high in fiber and low in saturated fat, trans fat, and cholesterol may control cholesterol levels.  Take any prescribed medicines to control cholesterol as directed by your health care provider.  Manage your diabetes.  Controlling your carbohydrate and sugar intake is recommended to manage diabetes.  Take any prescribed medicines to control diabetes as directed by your health care provider.  Control your hypertension.  Making food choices that are low in salt (sodium), saturated fat, trans fat, and cholesterol is recommended to manage hypertension.  Ask your health care provider if you need treatment to lower your blood pressure. Take any prescribed medicines to control hypertension as directed by your health care provider.  If you are 18-39 years of age, have your blood pressure checked every 3-5 years. If you are 40 years of age or older, have your blood pressure checked every year.  Maintain a healthy weight.  Reducing calorie intake and making food choices that are low in sodium, saturated fat, trans fat, and cholesterol are recommended to manage  weight.  Stop drug abuse.  Avoid taking birth control pills.  Talk to your health care provider about the risks of taking birth control pills if you are over 35 years old, smoke, get migraines, or have ever had a blood clot.  Get evaluated for sleep disorders (sleep apnea).  Talk to your health care provider about getting a sleep evaluation if you snore a lot or have excessive sleepiness.  Take medicines only as directed by your health care provider.  For some people, aspirin or blood thinners (anticoagulants) are helpful in reducing the risk of forming abnormal blood clots that can lead to stroke. If you have the irregular heart rhythm of atrial fibrillation, you should be on a blood thinner unless there is a good reason you cannot take them.  Understand all your medicine instructions.  Make sure that other conditions (such as anemia or atherosclerosis) are addressed. Get help right away if:  You have sudden weakness or numbness of the face, arm, or leg, especially on one side of the body.  Your face or eyelid droops to one side.  You have sudden confusion.  You have trouble speaking (aphasia) or understanding.  You have sudden trouble seeing in one or both eyes.  You have sudden trouble walking.  You have dizziness.  You have a loss of balance or coordination.  You have a sudden, severe headache with no known cause.  You have new chest pain or an irregular heartbeat. Any of these symptoms may represent a serious problem that is an emergency. Do not wait to see if the symptoms will go away.   Get medical help at once. Call your local emergency services (911 in [place] Do not drive yourself to the hospital. This information is not intended to replace advice given to you by your health care provider. Make sure you discuss any questions you have with your health care provider. Document Released: 02/23/2004 Document Revised: 06/23/2015 Document Reviewed: 07/18/2012 Elsevier  Interactive Patient Education  2017 [employer]  

## 2016-07-06 NOTE — Progress Notes (Signed)
Chief Complaint: Follow up Extracranial Carotid Artery Stenosis   History of Present Illness  Christina Mathis is a 81 y.o. female evaluated by Dr. Bridgett Larsson on 07-06-15 as a new referral. Her new PCP noticed a L carotid bruit.  Pt also has a history prior R SCA stenosis. The patient's risks factors for carotid disease include: HTN, HLD.  Pt has known LLE lymphedema after her colorectal cancer resection with lymph node dissection. 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    She denies any known history of stroke or TIA. Specifically he denies a history of amaurosis fugax or monocular blindness, unilateral facial drooping, hemiplegia, or receptive or expressive aphasia.   She denies claudication symptoms with walking.   Pt Diabetic: no Pt smoker: non-smoker  Pt meds include: Statin : yes ASA: yes Other anticoagulants/antiplatelets: no   Past Medical History:  Diagnosis Date  . Colon cancer ([place])   . History of shingles    twice  . Hypercholesteremia   . Hypertension   . Left carotid bruit   . Nephrolithiasis   . Neuropathy   . Urine incontinence 03/06/2013   h/o    Social History Social History  Substance Use Topics  . Smoking status: Never Smoker  . Smokeless tobacco: Never Used  . Alcohol use No    Family History Family History  Problem Relation Age of Onset  . Hypertension Mother   . Hypertension Father   . Breast cancer Paternal Aunt     Surgical History Past Surgical History:  Procedure Laterality Date  . APPENDECTOMY  1979   colorectal  . CESAREAN SECTION    . COLONOSCOPY WITH PROPOFOL N/A 03/21/2015   Procedure: COLONOSCOPY WITH PROPOFOL;  Surgeon: Hulen Luster, MD;  Location: [facility] ENDOSCOPY;  Service: Gastroenterology;  Laterality: N/A;  . COLOSTOMY    . surgery for colorectal cancer    . VENTRAL HERNIA REPAIR Right [phone]    No Known Allergies  Current Outpatient Prescriptions  Medication Sig Dispense Refill  . alendronate (FOSAMAX) 70  MG tablet Take 1 tablet (70 mg total) by mouth every 7 (seven) days. Take with a full glass of water on an empty stomach. 4 tablet 11  . Alpha-Lipoic Acid 100 MG CAPS Take by mouth.    Marland Kitchen aspirin EC 81 MG tablet Take 81 mg by mouth daily.    . B Complex-C (SUPER B COMPLEX PO) Take 1 tablet by mouth daily.    . Biotin 1000 MCG tablet Take 1,000 mcg by mouth 3 (three) times daily.    . calcium citrate-vitamin D (CITRACAL+D) 315-200 MG-UNIT per tablet Take 1 tablet by mouth 2 (two) times daily.    . Cholecalciferol (VITAMIN D3) 5000 units CAPS Take 1 capsule by mouth daily.    Marland Kitchen glucosamine-chondroitin 500-400 MG tablet Take 1 tablet by mouth 2 (two) times daily.    Marland Kitchen loratadine (CLARITIN) 10 MG tablet Take 10 mg by mouth daily as needed.     Marland Kitchen losartan-hydrochlorothiazide (HYZAAR) 50-12.5 MG tablet Take 1 tablet by mouth daily. 90 tablet 1  . lovastatin (MEVACOR) 20 MG tablet Take 1 tablet (20 mg total) by mouth at bedtime. (Patient taking differently: Take 10 mg by mouth at bedtime. ) 90 tablet 1  . Multiple Vitamin (MULTI-VITAMINS) TABS Take 1 tablet by mouth daily.      No current facility-administered medications for this visit.    Facility-Administered Medications Ordered in Other Visits  Medication Dose Route Frequency Provider Last Rate Last  Dose  . sodium chloride flush (NS) 0.9 % injection 10 mL  10 mL Intravenous PRN Choksi, Delorise Shiner, MD   10 mL at 05/31/15 1040  . sodium chloride flush (NS) 0.9 % injection 10 mL  10 mL Intravenous PRN Forest Gleason, MD   10 mL at 07/12/15 1018    Review of Systems : See HPI for pertinent positives and negatives.  Physical Examination  Vitals:   07/06/16 1101 07/06/16 1104  BP: (!) 160/71 (!) 149/68  Pulse: (!) 57   Resp: 20   Temp: 97.3 F (36.3 C)   TempSrc: Oral   SpO2: 98%   Weight: 161 lb (73 kg)   Height: 5\' 3"  (1.6 m)    Body mass index is 28.52 kg/m.  General: A&O x 3, WDWN female  Head: [place]/AT  Eyes: PERRLA  Neck: Supple,  mild kyphosis, no thyromegaly.   Pulmonary: Sym exp, respirations are non labored, good air movt, CTAB, no rales, rhonchi, & wheezing, healed R infraclavicular incision with palpable mediport  Cardiac:Regualr rate and rhythm, + murmur   Vascular: Vessel Right Left  Radial Palpable Palpable  Brachial Palpable Palpable  Carotid Palpable, without bruit Palpable, with bruit  Aorta  Not palpable N/A  Popliteal Not palpable Not palpable  PT Palpable Palpable  DP Not Palpable Not Palpable   Gastrointestinal: soft, NTND, -G/R, - HSM, - masses, - CVAT B, healing incision, viable LLQ ostomy with appliance in place.   Musculoskeletal: M/S 5/5 throughout , Extremities without ischemic changes , 2nd toe deformity bilaterally, mild B ankle LDS, LLE edema 1+, mild varicosities. Wearing knee hight compression hose.   Neurologic: CN 2-12 intact , Pain and light touch intact in extremities except decreased sensation in feet, Motor exam as listed above  Psychiatric: Judgment intact, Mood & affect appropriate for pt's clinical situation. Loquacious.   Dermatologic: See M/S exam for extremity exam, no rashes otherwise noted  Lymph : No Cervical, Axillary, or Inguinal lymphadenopathy       Assessment: Christina Mathis is a 81 y.o. female who presents with B asx ICA stenosis <40%, likely non-hemodynamically significant R SCA stenosis, mild LLE lymphedema. She has no history of stroke or TIA. She has no steal symptoms in either upper extremity.  She is s/p surgical resection and chemotherapy for colorectal cancer.   DATA Carotid Duplex (07/06/16): <40% bilateral proximal ICA stenosis. Increased velocity in the mid to distal left ICA with increased velocity 402/94 cm/s) at area of significant tortuosity with no visualized plaque. Retrograde right vertebral artery suggests possible subclavian/vertebral disease.  Multiphasic bilateral subclavian arteries (normal).     The retrograde right  vertebral artery flow suggests some right SCA stenosis, but the BP in the right arm is higher than left suggesting this is not hemodynamically significant.  Also the patient is currently asx, so Dr. Bridgett Larsson suggests watching this for now. Based on the patient's vascular studies and examination, and after discussing with Dr. Bridgett Larsson, will schedule CTA neck for 1 month to evaluate significant tortuosity demonstrated on today's carotid duplex. Pt remains asymptomatic: no recent or remote history of stroke or TIA, no steal symptoms in either upper extremity.    I discussed in depth with the patient the nature of atherosclerosis, and emphasized the importance of maximal medical management including strict control of blood pressure, blood glucose, and lipid levels, obtaining regular exercise, and continued cessation of smoking.  The patient is aware that without maximal medical management the underlying atherosclerotic disease process will progress,  limiting the benefit of any interventions. The patient was given information about stroke prevention and what symptoms should prompt the patient to seek immediate medical care. Thank you for allowing [place] to participate in this patient's care.  Clemon Chambers, RN, MSN, FNP-C [facility] [place] [place] Office: [phone]  Clinic Physician: Bridgett Larsson  07/06/16 11:07 AM

## 2016-07-06 NOTE — Addendum Note (Signed)
Addended by: Lianne Cure A on: 07/06/2016 01:15 PM   Modules accepted: Orders

## 2016-07-11 ENCOUNTER — Ambulatory Visit: Payer: Medicare Other | Admitting: Oncology

## 2016-07-11 ENCOUNTER — Other Ambulatory Visit: Payer: Medicare Other

## 2016-07-16 DIAGNOSIS — Z85038 Personal history of other malignant neoplasm of large intestine: Secondary | ICD-10-CM | POA: Diagnosis not present

## 2016-07-16 DIAGNOSIS — Z933 Colostomy status: Secondary | ICD-10-CM | POA: Diagnosis not present

## 2016-07-30 ENCOUNTER — Encounter: Payer: Self-pay | Admitting: Vascular Surgery

## 2016-07-31 ENCOUNTER — Ambulatory Visit
Admission: RE | Admit: 2016-07-31 | Discharge: 2016-07-31 | Disposition: A | Discharge status: Home / Self Care | Payer: Medicare Other | Source: Ambulatory Visit | Attending: Vascular Surgery | Admitting: Vascular Surgery

## 2016-07-31 DIAGNOSIS — I6501 Occlusion and stenosis of right vertebral artery: Secondary | ICD-10-CM | POA: Diagnosis not present

## 2016-07-31 DIAGNOSIS — I771 Stricture of artery: Secondary | ICD-10-CM

## 2016-07-31 DIAGNOSIS — I779 Disorder of arteries and arterioles, unspecified: Secondary | ICD-10-CM

## 2016-07-31 DIAGNOSIS — I739 Peripheral vascular disease, unspecified: Principal | ICD-10-CM

## 2016-07-31 MED ORDER — IOPAMIDOL (ISOVUE-370) INJECTION 76%
75.0000 mL | Freq: Once | INTRAVENOUS | Status: AC | PRN
  Administered 2016-07-31: 75 mL via INTRAVENOUS

## 2016-08-06 NOTE — Progress Notes (Addendum)
Established Carotid Patient   History of Present Illness   Christina Mathis is a 81 y.o. (1935-10-03) female who presents with chief complaint: intermittent vertigo.  Previous carotid studies demonstrated: RICA <11% stenosis, LICA <91% stenosis.  There was segment in the LICA with elevated velocities (402/94 c/s).  Patient has nohistory of TIA or stroke symptom.  The patient has never had amaurosis fugax or monocular blindness.  The patient has never had facial drooping or hemiplegia.  The patient has never had receptive or expressive aphasia.  The patient recently had a CTA Neck to evaluate a segment of the L ICA with elevated velocities.  The patient has a known history of intermittent vertigo, which sx c/w benign positional vertigo.  These episodes have been few and self limiting  The patient has known R SCA stenosis.  Her sx from her R arm are: none.  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  The patient is able to walk 2 miles/day.  The patient's PMH, PSH, SH, and FamHx are unchanged from 07/06/16.  Current Outpatient Prescriptions  Medication Sig Dispense Refill  . alendronate (FOSAMAX) 70 MG tablet Take 1 tablet (70 mg total) by mouth every 7 (seven) days. Take with a full glass of water on an empty stomach. 4 tablet 11  . Alpha-Lipoic Acid 100 MG CAPS Take by mouth.    Marland Kitchen aspirin EC 81 MG tablet Take 81 mg by mouth daily.    . B Complex-C (SUPER B COMPLEX PO) Take 1 tablet by mouth daily.    . Biotin 1000 MCG tablet Take 1,000 mcg by mouth 3 (three) times daily.    . calcium citrate-vitamin D (CITRACAL+D) 315-200 MG-UNIT per tablet Take 1 tablet by mouth 2 (two) times daily.    . Cholecalciferol (VITAMIN D3) 5000 units CAPS Take 1 capsule by mouth daily.    Marland Kitchen glucosamine-chondroitin 500-400 MG tablet Take 1 tablet by mouth 2 (two) times daily.    Marland Kitchen loratadine (CLARITIN) 10 MG tablet Take 10 mg by mouth daily as needed.     Marland Kitchen  losartan-hydrochlorothiazide (HYZAAR) 50-12.5 MG tablet Take 1 tablet by mouth daily. 90 tablet 1  . lovastatin (MEVACOR) 20 MG tablet Take 1 tablet (20 mg total) by mouth at bedtime. (Patient taking differently: Take 10 mg by mouth at bedtime. ) 90 tablet 1  . Multiple Vitamin (MULTI-VITAMINS) TABS Take 1 tablet by mouth daily.      No current facility-administered medications for this visit.    Facility-Administered Medications Ordered in Other Visits  Medication Dose Route Frequency Provider Last Rate Last Dose  . sodium chloride flush (NS) 0.9 % injection 10 mL  10 mL Intravenous PRN Forest Gleason, MD   10 mL at 05/31/15 1040  . sodium chloride flush (NS) 0.9 % injection 10 mL  10 mL Intravenous PRN Forest Gleason, MD   10 mL at 07/12/15 1018    On ROS today: +B leg intermittent paraesthesia, +vertigo   Physical Examination   Vitals:   08/08/16 0829 08/08/16 0831 08/08/16 0833  BP: (!) 157/64 138/66 131/79  Pulse: 66 69 69  Resp: 16    Temp: (!) 97 F (36.1 C)    SpO2: 99%    Weight: 160 lb (72.6 kg)    Height: 5' 3.5" (1.613 m)     Body mass index is 27.9 kg/m.  General Alert, O x 3, WD, NAD  Neck Supple, mid-line trachea,    Pulmonary  Sym exp, good B air movt, CTA B  Cardiac RRR, Nl S1, S2, no Murmurs, No rubs, No S3,S4  Vascular Vessel Right Left  Radial Faintly palpable Palpable  Brachial Palpable Palpable  Carotid Palpable, No Bruit Palpable, Bruit present  Aorta Not palpable N/A  Femoral Palpable Palpable  Popliteal Not palpable Not palpable  PT Palpable Palpable  DP Palpable Palpable    Gastro- intestinal soft, non-distended, non-tender to palpation, No guarding or rebound, no HSM, no masses, no CVAT B, No palpable prominent aortic pulse, Surgical incisions well healed  Musculo- skeletal M/S 5/5 throughout  , Extremities without ischemic changes    Neurologic Cranial nerves 2-12 intact , Pain and light touch intact in extremities , Motor exam as listed above     Non-Invasive Vascular Imaging   CTA Neck (07/31/16) 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 Distal right vertebral artery is reconstituted beneath the skullbase by cervical collaterals in shows flow to the basilar.  Dominant left vertebral artery is widely patent. Minimal atherosclerotic disease of the left subclavian artery proximally but without stenosis.  Right carotid bifurcation normal. Kink of the cervical ICA on the right. Left carotid bifurcation calcified plaque at the proximal ICA with 30% stenosis.  Based on my review of the CTA, I agree that the patient has ~50% stenosis in the L ICA.  The tortuosity is well demonstrated on CTA, likely the cause of the elevated PSV.   Medical Decision Making   Christina Mathis is a 81 y.o. female who presents with: asx R ICA stenosis <40%, asx L ICA stenosis ~50%, likely BPPV   Based on the patient's vascular studies and examination, I have offered the patient: q6 mon B carotid duplex.  I discussed in depth with the patient the nature of atherosclerosis, and emphasized the importance of maximal medical management including strict control of blood pressure, blood glucose, and lipid levels, antiplatelet agents, obtaining regular exercise, and cessation of smoking.    The patient is aware that without maximal medical management the underlying atherosclerotic disease process will progress, limiting the benefit of any interventions. The patient is currently on a statin: Mevacor.  The patient is currently on an anti-platelet: ASA.  In regards to her BPPV, this appears to be self limiting so further evaluation is likely to have limited benefit.  If her vertigo increases, would consider formal evaluation with Neurology and possible referral for PT for BPPV.  Thank you for allowing [place] to participate in this patient's care.   Adele Barthel, MD, FACS [facility] [place] Office: [phone] Pager:  [phone]

## 2016-08-08 ENCOUNTER — Ambulatory Visit (INDEPENDENT_AMBULATORY_CARE_PROVIDER_SITE_OTHER): Payer: Medicare Other | Admitting: Vascular Surgery

## 2016-08-08 ENCOUNTER — Other Ambulatory Visit: Payer: Medicare Other

## 2016-08-08 ENCOUNTER — Encounter: Payer: Self-pay | Admitting: Vascular Surgery

## 2016-08-08 ENCOUNTER — Ambulatory Visit: Payer: Medicare Other | Admitting: Oncology

## 2016-08-08 VITALS — BP 131/79 | HR 69 | Temp 97.0°F | Resp 16 | Ht 63.5 in | Wt 160.0 lb

## 2016-08-08 DIAGNOSIS — I739 Peripheral vascular disease, unspecified: Principal | ICD-10-CM

## 2016-08-08 DIAGNOSIS — H8110 Benign paroxysmal vertigo, unspecified ear: Secondary | ICD-10-CM | POA: Diagnosis not present

## 2016-08-08 DIAGNOSIS — I779 Disorder of arteries and arterioles, unspecified: Secondary | ICD-10-CM | POA: Diagnosis not present

## 2016-08-15 ENCOUNTER — Inpatient Hospital Stay: Payer: Medicare Other | Attending: Oncology | Admitting: Oncology

## 2016-08-15 ENCOUNTER — Inpatient Hospital Stay: Payer: Medicare Other | Admitting: *Deleted

## 2016-08-15 VITALS — BP 133/70 | HR 64 | Temp 98.2°F | Resp 20 | Wt 157.8 lb

## 2016-08-15 DIAGNOSIS — E7800 Pure hypercholesterolemia, unspecified: Secondary | ICD-10-CM | POA: Diagnosis not present

## 2016-08-15 DIAGNOSIS — G629 Polyneuropathy, unspecified: Secondary | ICD-10-CM

## 2016-08-15 DIAGNOSIS — Z923 Personal history of irradiation: Secondary | ICD-10-CM | POA: Insufficient documentation

## 2016-08-15 DIAGNOSIS — Z85048 Personal history of other malignant neoplasm of rectum, rectosigmoid junction, and anus: Secondary | ICD-10-CM | POA: Diagnosis not present

## 2016-08-15 DIAGNOSIS — C20 Malignant neoplasm of rectum: Secondary | ICD-10-CM

## 2016-08-15 DIAGNOSIS — I10 Essential (primary) hypertension: Secondary | ICD-10-CM | POA: Diagnosis not present

## 2016-08-15 DIAGNOSIS — Z7982 Long term (current) use of aspirin: Secondary | ICD-10-CM | POA: Diagnosis not present

## 2016-08-15 DIAGNOSIS — Z933 Colostomy status: Secondary | ICD-10-CM | POA: Diagnosis not present

## 2016-08-15 DIAGNOSIS — I890 Lymphedema, not elsewhere classified: Secondary | ICD-10-CM | POA: Insufficient documentation

## 2016-08-15 DIAGNOSIS — Z79899 Other long term (current) drug therapy: Secondary | ICD-10-CM

## 2016-08-15 DIAGNOSIS — Z9221 Personal history of antineoplastic chemotherapy: Secondary | ICD-10-CM | POA: Diagnosis not present

## 2016-08-15 LAB — COMPREHENSIVE METABOLIC PANEL
ALT: 23 U/L (ref 14–54)
AST: 23 U/L (ref 15–41)
Albumin: 3.8 g/dL (ref 3.5–5.0)
Alkaline Phosphatase: 52 U/L (ref 38–126)
Anion gap: 5 (ref 5–15)
BILIRUBIN TOTAL: 0.9 mg/dL (ref 0.3–1.2)
BUN: 18 mg/dL (ref 6–20)
CALCIUM: 9.3 mg/dL (ref 8.9–10.3)
CO2: 29 mmol/L (ref 22–32)
CREATININE: 0.86 mg/dL (ref 0.44–1.00)
Chloride: 102 mmol/L (ref 101–111)
GFR calc Af Amer: >60 mL/min (ref >60)
Glucose, Bld: 124 mg/dL — ABNORMAL HIGH (ref 65–99)
Potassium: 4.1 mmol/L (ref 3.5–5.1)
Sodium: 136 mmol/L (ref 135–145)
TOTAL PROTEIN: 6.9 g/dL (ref 6.5–8.1)

## 2016-08-15 LAB — CBC WITH DIFFERENTIAL/PLATELET
BASOS ABS: 0.1 10*3/uL (ref 0–0.1)
BASOS PCT: 1 %
EOS PCT: 1 %
Eosinophils Absolute: 0 10*3/uL (ref 0–0.7)
HCT: 36.8 % (ref 35.0–47.0)
Hemoglobin: 12.8 g/dL (ref 12.0–16.0)
LYMPHS PCT: 13 %
Lymphs Abs: 0.7 10*3/uL — ABNORMAL LOW (ref 1.0–3.6)
MCH: 35.1 pg — ABNORMAL HIGH (ref 26.0–34.0)
MCHC: 34.7 g/dL (ref 32.0–36.0)
MCV: 101.2 fL — AB (ref 80.0–100.0)
Monocytes Absolute: 0.5 10*3/uL (ref 0.2–0.9)
Monocytes Relative: 8 %
Neutro Abs: 4.4 10*3/uL (ref 1.4–6.5)
Neutrophils Relative %: 77 %
PLATELETS: 313 10*3/uL (ref 150–440)
RBC: 3.64 MIL/uL — AB (ref 3.80–5.20)
RDW: 13.3 % (ref 11.5–14.5)
WBC: 5.7 10*3/uL (ref 3.6–11.0)

## 2016-08-15 NOTE — Progress Notes (Signed)
Patient denies any concerns today.  

## 2016-08-15 NOTE — Progress Notes (Signed)
[place]  [phone]  [phone]     Christina Mathis DOB: 1935/12/21  MR#: [id number]  TKW#:[id number]  Patient Care Team: Christina Pheasant, MD as PCP - General (Internal Medicine) Christina Gleason, MD (Oncology) Christina Mathis, DPM as Consulting Physician (Podiatry)  CHIEF COMPLAINT: Adenocarcinoma of the rectum.  INTERVAL HISTORY:   Patient returns to clinic today for routine six-month follow-up. She currently feels well and is asymptomatic. Her colostomy is functioning well. She has no neurologic complaints. She denies any recent fevers or illnesses. She has no chest pain or shortness of breath. She has a good appetite and denies weight loss. Patient denies any blood in her stool, melena, nausea, vomiting, or diarrhea. Patient stopped taking Lyrica daily at bedtime for peripheral neuropathy due to making her head feel "foggy". She also has tried gabapentin which had the same side effect. Peripheral neuropathy is not interfering with activities of daily living. Patient offers no further specific complaints today.  REVIEW OF SYSTEMS:   Review of Systems  Constitutional: Negative.  Negative for fever, malaise/fatigue and weight loss.  Respiratory: Negative.  Negative for cough and shortness of breath.   Cardiovascular: Negative.  Negative for chest pain and leg swelling.  Gastrointestinal: Negative for abdominal pain and blood in stool.  Genitourinary: Negative.   Musculoskeletal: Negative.   Neurological: Positive for sensory change. Negative for weakness.  Psychiatric/Behavioral: Negative.  The patient is not nervous/anxious.     As per HPI. Otherwise, a complete review of systems is negative.  ONCOLOGY HISTORY: Oncology History   21. 81 year old female with stage III (T3, N1, M0) adenocarcinoma of the rectum for  preop chemoradiation 2. Starting 5-FU by continuous infusion and radiation therapy November 24, 2012 3. Patient is finishing up radiation  and chemotherapy on December 15 of 2014 4. Status postsurgery and colostomyFebruary of 2015, pT0 pNO tumor stage 0 5. Post operative adjuvant with FOLFOX march 2015 6.patient finished total 6 cycles of chemotherapy with FOLFOX on August 31, 2013. 7.  January 2 016 [name] had hernia repair     PAST MEDICAL HISTORY: Past Medical History:  Diagnosis Date  . Colon cancer ([place])   . History of shingles    twice  . Hypercholesteremia   . Hypertension   . Left carotid bruit   . Nephrolithiasis   . Neuropathy   . Urine incontinence 03/06/2013   h/o    PAST SURGICAL HISTORY: Past Surgical History:  Procedure Laterality Date  . APPENDECTOMY  1979   colorectal  . CESAREAN SECTION    . COLONOSCOPY WITH PROPOFOL N/A 03/21/2015   Procedure: COLONOSCOPY WITH PROPOFOL;  Surgeon: Christina Luster, MD;  Location: [facility] ENDOSCOPY;  Service: Gastroenterology;  Laterality: N/A;  . COLOSTOMY    . surgery for colorectal cancer    . VENTRAL HERNIA REPAIR Right [phone]    FAMILY HISTORY Family History  Problem Relation Age of Onset  . Hypertension Mother   . Hypertension Father   . Breast cancer Paternal Aunt     GYNECOLOGIC HISTORY:  No LMP recorded. Patient is postmenopausal.     ADVANCED DIRECTIVES:    HEALTH MAINTENANCE: Social History  Substance Use Topics  . Smoking status: Never Smoker  . Smokeless tobacco: Never Used  . Alcohol use No     No Known Allergies  Current Outpatient Prescriptions  Medication Sig Dispense Refill  . alendronate (FOSAMAX) 70 MG tablet Take 1 tablet (70 mg total) by mouth every 7 (seven) days. Take  with a full glass of water on an empty stomach. 4 tablet 11  . Alpha-Lipoic Acid 100 MG CAPS Take by mouth.    Marland Kitchen aspirin EC 81 MG tablet Take 81 mg by mouth daily.    . B Complex-C (SUPER B COMPLEX PO) Take 1 tablet by mouth daily.    . Biotin 1000 MCG tablet Take 1,000 mcg by mouth 3 (three) times daily.    . calcium citrate-vitamin D (CITRACAL+D) 315-200  MG-UNIT per tablet Take 1 tablet by mouth 2 (two) times daily.    . Cholecalciferol (VITAMIN D3) 5000 units CAPS Take 1 capsule by mouth daily.    Marland Kitchen glucosamine-chondroitin 500-400 MG tablet Take 1 tablet by mouth 2 (two) times daily.    Marland Kitchen losartan-hydrochlorothiazide (HYZAAR) 50-12.5 MG tablet Take 1 tablet by mouth daily. 90 tablet 1  . lovastatin (MEVACOR) 20 MG tablet Take 1 tablet (20 mg total) by mouth at bedtime. (Patient taking differently: Take 10 mg by mouth at bedtime. ) 90 tablet 1  . Multiple Vitamin (MULTI-VITAMINS) TABS Take 1 tablet by mouth daily.     Marland Kitchen loratadine (CLARITIN) 10 MG tablet Take 10 mg by mouth daily as needed.      No current facility-administered medications for this visit.    Facility-Administered Medications Ordered in Other Visits  Medication Dose Route Frequency Provider Last Rate Last Dose  . sodium chloride flush (NS) 0.9 % injection 10 mL  10 mL Intravenous PRN Christina Gleason, MD   10 mL at 05/31/15 1040  . sodium chloride flush (NS) 0.9 % injection 10 mL  10 mL Intravenous PRN Choksi, Delorise Shiner, MD   10 mL at 07/12/15 1018    OBJECTIVE: BP 133/70   Pulse 64   Temp 98.2 F (36.8 C) (Tympanic)   Resp 20   Wt 157 lb 12.8 oz (71.6 kg)   BMI 27.51 kg/m    Body mass index is 27.51 kg/m.    ECOG FS:0 - Asymptomatic  General: Well-developed, well-nourished, no acute distress. Eyes: Pink conjunctiva, anicteric sclera. HEENT: Normocephalic, moist mucous membranes, clear oropharnyx. Lungs: Clear to auscultation bilaterally. Heart: Regular rate and rhythm. No rubs, murmurs, or gallops. Abdomen: Soft, nontender, nondistended. No organomegaly noted, normoactive bowel sounds. Colostomy in place and functioning. Musculoskeletal: No edema, cyanosis, or clubbing. Neuro: Alert, answering all questions appropriately. Cranial nerves grossly intact. Skin: No rashes or petechiae noted. Psych: Normal affect. Lymphatics: No cervical, clavicular, or axillary  LAD.   LAB RESULTS:  Clinical Support on 08/15/2016  Component Date Value Ref Range Status  . WBC 08/15/2016 5.7  3.6 - 11.0 K/uL Final  . RBC 08/15/2016 3.64* 3.80 - 5.20 MIL/uL Final  . Hemoglobin 08/15/2016 12.8  12.0 - 16.0 g/dL Final  . HCT 08/15/2016 36.8  35.0 - 47.0 % Final  . MCV 08/15/2016 101.2* 80.0 - 100.0 fL Final  . MCH 08/15/2016 35.1* 26.0 - 34.0 pg Final  . MCHC 08/15/2016 34.7  32.0 - 36.0 g/dL Final  . RDW 08/15/2016 13.3  11.5 - 14.5 % Final  . Platelets 08/15/2016 313  150 - 440 K/uL Final  . Neutrophils Relative % 08/15/2016 77  % Final  . Neutro Abs 08/15/2016 4.4  1.4 - 6.5 K/uL Final  . Lymphocytes Relative 08/15/2016 13  % Final  . Lymphs Abs 08/15/2016 0.7* 1.0 - 3.6 K/uL Final  . Monocytes Relative 08/15/2016 8  % Final  . Monocytes Absolute 08/15/2016 0.5  0.2 - 0.9 K/uL Final  . Eosinophils  Relative 08/15/2016 1  % Final  . Eosinophils Absolute 08/15/2016 0.0  0 - 0.7 K/uL Final  . Basophils Relative 08/15/2016 1  % Final  . Basophils Absolute 08/15/2016 0.1  0 - 0.1 K/uL Final  . Sodium 08/15/2016 136  135 - 145 mmol/L Final  . Potassium 08/15/2016 4.1  3.5 - 5.1 mmol/L Final  . Chloride 08/15/2016 102  101 - 111 mmol/L Final  . CO2 08/15/2016 29  22 - 32 mmol/L Final  . Glucose, Bld 08/15/2016 124* 65 - 99 mg/dL Final  . BUN 08/15/2016 18  6 - 20 mg/dL Final  . Creatinine, Ser 08/15/2016 0.86  0.44 - 1.00 mg/dL Final  . Calcium 08/15/2016 9.3  8.9 - 10.3 mg/dL Final  . Total Protein 08/15/2016 6.9  6.5 - 8.1 g/dL Final  . Albumin 08/15/2016 3.8  3.5 - 5.0 g/dL Final  . AST 08/15/2016 23  15 - 41 U/L Final  . ALT 08/15/2016 23  14 - 54 U/L Final  . Alkaline Phosphatase 08/15/2016 52  38 - 126 U/L Final  . Total Bilirubin 08/15/2016 0.9  0.3 - 1.2 mg/dL Final  . GFR calc non Af Amer 08/15/2016 >60  >60 mL/min Final  . GFR calc Af Amer 08/15/2016 >60  >60 mL/min Final   Comment: (NOTE) The eGFR has been calculated using the CKD EPI  equation. This calculation has not been validated in all clinical situations. eGFR's persistently <60 mL/min signify possible Chronic Kidney Disease.   . Anion gap 08/15/2016 5  5 - 15 Final   Lab Results  Component Value Date   CEA 2.3 01/11/2016    STUDIES: No results found.  ASSESSMENT:  Adenocarcinoma of the rectum, stage III, T3 N1 M0.  PLAN:    1. Rectal cancer: Clinically there is no evidence of recurrent or progressive disease. Patient's most recent colonoscopy was in February 2017 and reported as normal, her next colonoscopy will be due in 5 years. Her most recent CEA was within normal limits, today's result is pending. Patient completed 6 cycles of adjuvant FOLFOX in August 2015. She can now be switched to yearly visits.  2. Lymphedema in the left lower extremity: Continue compression stockings as instructed by lymphedema clinic.  3. Peripheral neuropathy: Unchanged. Patient has discontinued Lyrica and gabapentin.   Patient expressed understanding and was in agreement with this plan. She also understands that She can call clinic at any time with any questions, concerns, or complaints.    Lloyd Huger, MD 08/15/16 1:30 PM

## 2016-08-16 LAB — CEA: CEA1: 2.4 ng/mL (ref 0.0–4.7)

## 2016-08-24 NOTE — Addendum Note (Signed)
Addended by: Lianne Cure A on: 08/24/2016 04:29 PM   Modules accepted: Orders

## 2016-09-14 ENCOUNTER — Other Ambulatory Visit: Payer: Self-pay | Admitting: Internal Medicine

## 2016-09-17 ENCOUNTER — Other Ambulatory Visit: Payer: Self-pay

## 2016-09-17 ENCOUNTER — Telehealth: Payer: Self-pay | Admitting: Internal Medicine

## 2016-09-17 MED ORDER — ALENDRONATE SODIUM 70 MG PO TABS: 70.0000 mg | ORAL_TABLET | ORAL | 11 refills | Status: DC

## 2016-09-17 NOTE — Telephone Encounter (Signed)
Pt called about needing a new Rx for alendronate (FOSAMAX) 70 MG tablet.  Pharmacy is [facility] [address], [place] - [place]  Call pt @ [phone]. Thank you!

## 2016-09-17 NOTE — Telephone Encounter (Signed)
Sent script

## 2016-10-08 DIAGNOSIS — Z933 Colostomy status: Secondary | ICD-10-CM | POA: Diagnosis not present

## 2016-10-08 DIAGNOSIS — Z85038 Personal history of other malignant neoplasm of large intestine: Secondary | ICD-10-CM | POA: Diagnosis not present

## 2016-10-19 ENCOUNTER — Other Ambulatory Visit (INDEPENDENT_AMBULATORY_CARE_PROVIDER_SITE_OTHER): Payer: Medicare Other

## 2016-10-19 DIAGNOSIS — E7800 Pure hypercholesterolemia, unspecified: Secondary | ICD-10-CM | POA: Diagnosis not present

## 2016-10-19 DIAGNOSIS — E119 Type 2 diabetes mellitus without complications: Secondary | ICD-10-CM | POA: Diagnosis not present

## 2016-10-19 LAB — HEPATIC FUNCTION PANEL
ALK PHOS: 49 U/L (ref 39–117)
ALT: 17 U/L (ref 0–35)
AST: 20 U/L (ref 0–37)
Albumin: 3.9 g/dL (ref 3.5–5.2)
BILIRUBIN DIRECT: 0.2 mg/dL (ref 0.0–0.3)
BILIRUBIN TOTAL: 1 mg/dL (ref 0.2–1.2)
Total Protein: 6.3 g/dL (ref 6.0–8.3)

## 2016-10-19 LAB — BASIC METABOLIC PANEL
BUN: 20 mg/dL (ref 6–23)
CALCIUM: 9.3 mg/dL (ref 8.4–10.5)
CO2: 31 meq/L (ref 19–32)
Chloride: 104 mEq/L (ref 96–112)
Creatinine, Ser: 0.96 mg/dL (ref 0.40–1.20)
GFR: 59.57 mL/min — ABNORMAL LOW (ref >60.00)
Glucose, Bld: 130 mg/dL — ABNORMAL HIGH (ref 70–99)
Potassium: 5.4 mEq/L — ABNORMAL HIGH (ref 3.5–5.1)
SODIUM: 141 meq/L (ref 135–145)

## 2016-10-19 LAB — HEMOGLOBIN A1C: HEMOGLOBIN A1C: 5.7 % (ref 4.6–6.5)

## 2016-10-19 LAB — LIPID PANEL
CHOL/HDL RATIO: 4
Cholesterol: 124 mg/dL (ref 0–200)
HDL: 34 mg/dL — AB (ref >39.00)
LDL CALC: 64 mg/dL (ref 0–99)
NONHDL: 90.07
Triglycerides: 130 mg/dL (ref 0.0–149.0)
VLDL: 26 mg/dL (ref 0.0–40.0)

## 2016-10-19 LAB — MICROALBUMIN / CREATININE URINE RATIO
CREATININE, U: 72.6 mg/dL
MICROALB UR: 1.1 mg/dL (ref 0.0–1.9)
MICROALB/CREAT RATIO: 1.5 mg/g (ref 0.0–30.0)

## 2016-10-22 ENCOUNTER — Ambulatory Visit (INDEPENDENT_AMBULATORY_CARE_PROVIDER_SITE_OTHER): Payer: Medicare Other | Admitting: Internal Medicine

## 2016-10-22 ENCOUNTER — Encounter: Payer: Self-pay | Admitting: Internal Medicine

## 2016-10-22 VITALS — BP 124/68 | HR 64 | Temp 98.6°F | Resp 14 | Ht 64.0 in | Wt 157.4 lb

## 2016-10-22 DIAGNOSIS — E119 Type 2 diabetes mellitus without complications: Secondary | ICD-10-CM | POA: Diagnosis not present

## 2016-10-22 DIAGNOSIS — I779 Disorder of arteries and arterioles, unspecified: Secondary | ICD-10-CM

## 2016-10-22 DIAGNOSIS — Z1239 Encounter for other screening for malignant neoplasm of breast: Secondary | ICD-10-CM

## 2016-10-22 DIAGNOSIS — C20 Malignant neoplasm of rectum: Secondary | ICD-10-CM

## 2016-10-22 DIAGNOSIS — E7800 Pure hypercholesterolemia, unspecified: Secondary | ICD-10-CM

## 2016-10-22 DIAGNOSIS — Z1231 Encounter for screening mammogram for malignant neoplasm of breast: Secondary | ICD-10-CM

## 2016-10-22 DIAGNOSIS — E875 Hyperkalemia: Secondary | ICD-10-CM | POA: Diagnosis not present

## 2016-10-22 DIAGNOSIS — Z0000 Encounter for general adult medical examination without abnormal findings: Secondary | ICD-10-CM

## 2016-10-22 DIAGNOSIS — Z23 Encounter for immunization: Secondary | ICD-10-CM | POA: Diagnosis not present

## 2016-10-22 DIAGNOSIS — I10 Essential (primary) hypertension: Secondary | ICD-10-CM

## 2016-10-22 DIAGNOSIS — I739 Peripheral vascular disease, unspecified: Secondary | ICD-10-CM

## 2016-10-22 DIAGNOSIS — I771 Stricture of artery: Secondary | ICD-10-CM | POA: Diagnosis not present

## 2016-10-22 LAB — POTASSIUM: POTASSIUM: 5 meq/L (ref 3.5–5.1)

## 2016-10-22 MED ORDER — LOSARTAN POTASSIUM-HCTZ 50-12.5 MG PO TABS: 1.0000 | ORAL_TABLET | Freq: Every day | ORAL | 1 refills | Status: DC

## 2016-10-22 NOTE — Progress Notes (Signed)
[name], female   DOB: 1935-07-23, 81 y.o.   MRN: [id number]   Subjective:    [name], female    DOB: [birth year], 81 y.o.   MRN: [id number]  HPI  Patient here for her physical exam.  Has a history of rectal cancer.  Sees Dr Grayland Ormond.  Last evaluated 08/15/16.  No evidence of recurrent or progressive disease.  Last colonoscopy 03/2015.  CEA wnl.  Felt stable.  Recommended f/u in one year.  Also seeing Dr Bridgett Larsson for f/u regarding carotid stenosis.  Note reviewed.  Recommended f/u carotid ultrasound in 6 months.  She reports she is doing relatively well.  No chest pain.  Breathing stable.  No abdominal pain.   Colostomy working well.  Is exercising. Walks 2 miles per day.     Past Medical History:  Diagnosis Date  . Colon cancer ([place])   . History of shingles    twice  . Hypercholesteremia   . Hypertension   . Left carotid bruit   . Nephrolithiasis   . Neuropathy   . Urine incontinence 03/06/2013   h/o   Past Surgical History:  Procedure Laterality Date  . APPENDECTOMY  1979   colorectal  . CESAREAN SECTION    . COLONOSCOPY WITH PROPOFOL N/A 03/21/2015   Procedure: COLONOSCOPY WITH PROPOFOL;  Surgeon: Hulen Luster, MD;  Location: [facility] ENDOSCOPY;  Service: Gastroenterology;  Laterality: N/A;  . COLOSTOMY    . surgery for colorectal cancer    . VENTRAL HERNIA REPAIR Right [phone]   Family History  Problem Relation Age of Onset  . Hypertension Mother   . Hypertension Father   . Breast cancer Paternal Aunt    Social History   Social History  . Marital status: Widowed    Spouse name: N/A  . Number of children: N/A  . Years of education: N/A   Social History Main Topics  . Smoking status: Never Smoker  . Smokeless tobacco: Never Used  . Alcohol use No  . Drug use: No  . Sexual activity: No   Other Topics Concern  . None   Social History Narrative  . None    Outpatient Encounter Prescriptions as of 10/22/2016  Medication Sig  .  alendronate (FOSAMAX) 70 MG tablet Take 1 tablet (70 mg total) by mouth every 7 (seven) days. Take with a full glass of water on an empty stomach.  . Alpha-Lipoic Acid 100 MG CAPS Take by mouth.  Marland Kitchen aspirin EC 81 MG tablet Take 81 mg by mouth daily.  . B Complex-C (SUPER B COMPLEX PO) Take 1 tablet by mouth daily.  . Biotin 1000 MCG tablet Take 1,000 mcg by mouth 3 (three) times daily.  . calcium citrate-vitamin D (CITRACAL+D) 315-200 MG-UNIT per tablet Take 1 tablet by mouth 2 (two) times daily.  . Cholecalciferol (VITAMIN D3) 5000 units CAPS Take 1 capsule by mouth daily.  Marland Kitchen glucosamine-chondroitin 500-400 MG tablet Take 1 tablet by mouth 2 (two) times daily.  Marland Kitchen loratadine (CLARITIN) 10 MG tablet Take 10 mg by mouth daily as needed.   Marland Kitchen losartan-hydrochlorothiazide (HYZAAR) 50-12.5 MG tablet Take 1 tablet by mouth daily.  Marland Kitchen lovastatin (MEVACOR) 20 MG tablet Take 1 tablet (20 mg total) by mouth at bedtime. (Patient taking differently: Take 10 mg by mouth at bedtime. )  . Multiple Vitamin (MULTI-VITAMINS) TABS Take 1 tablet by mouth daily.   . [DISCONTINUED] losartan-hydrochlorothiazide (HYZAAR) 50-12.5 MG tablet Take 1 tablet by mouth daily.  Facility-Administered Encounter Medications as of 10/22/2016  Medication  . sodium chloride flush (NS) 0.9 % injection 10 mL  . sodium chloride flush (NS) 0.9 % injection 10 mL    Review of Systems  Constitutional: Negative for appetite change and unexpected weight change.  HENT: Negative for congestion and sinus pressure.   Eyes: Negative for pain and visual disturbance.  Respiratory: Negative for cough, chest tightness and shortness of breath.   Cardiovascular: Negative for chest pain and palpitations.  Gastrointestinal: Negative for abdominal pain, diarrhea, nausea and vomiting.  Genitourinary: Negative for difficulty urinating and dysuria.  Musculoskeletal: Negative for back pain and joint swelling.  Skin: Negative for color change and rash.    Neurological: Negative for dizziness, light-headedness and headaches.  Hematological: Negative for adenopathy. Does not bruise/bleed easily.  Psychiatric/Behavioral: Negative for agitation and dysphoric mood.       Objective:    Physical Exam  Constitutional: She is oriented to person, place, and time. She appears well-developed and well-nourished. No distress.  HENT:  Nose: Nose normal.  Mouth/Throat: Oropharynx is clear and moist.  Eyes: Right eye exhibits no discharge. Left eye exhibits no discharge. No scleral icterus.  Neck: Neck supple. No thyromegaly present.  Cardiovascular: Normal rate and regular rhythm.   Pulmonary/Chest: Breath sounds normal. No accessory muscle usage. No tachypnea. No respiratory distress. She has no decreased breath sounds. She has no wheezes. She has no rhonchi. Right breast exhibits no inverted nipple, no mass, no nipple discharge and no tenderness (no axillary adenopathy). Left breast exhibits no inverted nipple, no mass, no nipple discharge and no tenderness (no axilarry adenopathy).  Abdominal: Soft. Bowel sounds are normal. There is no tenderness.  Musculoskeletal: She exhibits no edema or tenderness.  Lymphadenopathy:    She has no cervical adenopathy.  Neurological: She is alert and oriented to person, place, and time.  Skin: Skin is warm. No rash noted. No erythema.  Psychiatric: She has a normal mood and affect. Her behavior is normal.    BP 124/68 (BP Location: Left Arm, Patient Position: Sitting, Cuff Size: Normal)   Pulse 64   Temp 98.6 F (37 C) (Oral)   Resp 14   Ht 5' 4" (1.626 m)   Wt 157 lb 6.4 oz (71.4 kg)   SpO2 98%   BMI 27.02 kg/m  Wt Readings from Last 3 Encounters:  10/22/16 157 lb 6.4 oz (71.4 kg)  08/15/16 157 lb 12.8 oz (71.6 kg)  08/08/16 160 lb (72.6 kg)     Lab Results  Component Value Date   WBC 5.7 08/15/2016   HGB 12.8 08/15/2016   HCT 36.8 08/15/2016   PLT 313 08/15/2016   GLUCOSE 130 (H) 10/19/2016    CHOL 124 10/19/2016   TRIG 130.0 10/19/2016   HDL 34.00 (L) 10/19/2016   LDLCALC 64 10/19/2016   ALT 17 10/19/2016   AST 20 10/19/2016   NA 141 10/19/2016   K 5.0 10/22/2016   CL 104 10/19/2016   CREATININE 0.96 10/19/2016   BUN 20 10/19/2016   CO2 31 10/19/2016   TSH 2.78 06/04/2016   INR 1.1 10/21/2012   HGBA1C 5.7 10/19/2016   MICROALBUR 1.1 10/19/2016    Ct Angio Neck W Or Wo Contrast  Result Date: 07/31/2016 CLINICAL DATA:  Subclavian artery stenosis.  Carotid stenosis. Creatinine was obtained on site at [place] at [address] Results: Creatinine 0.9 mg/dL. EXAM: CT ANGIOGRAPHY NECK TECHNIQUE: Multidetector CT imaging of the neck was performed using  the standard protocol during bolus administration of intravenous contrast. Multiplanar CT image reconstructions and MIPs were obtained to evaluate the vascular anatomy. Carotid stenosis measurements (when applicable) are obtained utilizing NASCET criteria, using the distal internal carotid diameter as the denominator. CONTRAST:  75 cc Isovue 370 COMPARISON:  None. FINDINGS: Aortic arch: Some atherosclerotic calcification but no aneurysm or dissection. Branching pattern of the brachiocephalic vessels is normal. Right carotid system: Common carotid artery is widely patent to the bifurcation. No carotid bifurcation atherosclerotic disease. Cervical ICA is tortuous with a kink but widely patent. Left carotid system: Common carotid artery widely patent to the bifurcation. Atherosclerotic disease at the carotid bifurcation affecting the proximal ICA with minimal diameter of 2.5 mm. Compared to a more distal cervical ICA diameter of 4 mm, this indicates a 30% stenosis. Vertebral arteries: The right vertebral artery is occluded in the lower cervical region. There is reconstitution just proximal to the skullbase from cervical collaterals. Flow is present within the distal right vertebral artery to the basilar. Left subclavian artery  shows mild atherosclerosis proximally but no stenosis. Left vertebral artery origin is widely patent at its origin and through the cervical region to the foramen magnum and a basilar. Skeleton: Degenerative spondylosis C5-6 with osteophytic encroachment upon the canal and foramina. Other neck: No mass or lymphadenopathy. 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 Distal right vertebral artery is reconstituted beneath the skullbase by cervical collaterals in shows flow to the basilar. Dominant left vertebral artery is widely patent. Minimal atherosclerotic disease of the left subclavian artery proximally but without stenosis. Right carotid bifurcation normal. Kink of the cervical ICA on the right. Left carotid bifurcation calcified plaque at the proximal ICA with 30% stenosis. Electronically Signed   By: Nelson Chimes M.D.   On: 07/31/2016 14:57       Assessment & Plan:   Problem List Items Addressed This Visit    Carotid artery disease ([place])    Followed by vascular surgery.  Results as outlined.  Recommended f/u in one year.  Last evaluated 07/2016.        Relevant Medications   losartan-hydrochlorothiazide (HYZAAR) 50-12.5 MG tablet   Essential hypertension    Blood pressure under good control.  Continue same medication regimen.  Follow pressures.  Follow metabolic panel.        Relevant Medications   losartan-hydrochlorothiazide (HYZAAR) 50-12.5 MG tablet   Other Relevant Orders   CBC with Differential/Platelet   Health care maintenance    Physical today 10/22/16.  PAP 06/09/14.  Mammogram 12/08/15 - [place] I.  Schedule f/u mammogram.        Malignant neoplasm of rectum ([facility])    H/o rectal cancer.  S/p chemo and XRT and colectomy.  Last colonoscopy 03/2015.  Recommended f/u in 5 years.  Seeing Dr Grayland Ormond.        Pure hypercholesterolemia    Low cholesterol diet and exercise.  Follow lipid panel.        Relevant Medications    losartan-hydrochlorothiazide (HYZAAR) 50-12.5 MG tablet   Other Relevant Orders   Hepatic function panel   Lipid panel   Subclavian arterial stenosis ([facility])    Seeing vascular surgery.        Relevant Medications   losartan-hydrochlorothiazide (HYZAAR) 50-12.5 MG tablet   Type 2 diabetes mellitus ([facility])    Low carb diet and exercise.  Follow met b and a1c.        Relevant Medications  losartan-hydrochlorothiazide (HYZAAR) 50-12.5 MG tablet   Other Relevant Orders   Hemoglobin R4Y   Basic metabolic panel    Other Visit Diagnoses    Routine general medical examination at a health care facility    -  Primary   Screening for breast cancer       Relevant Orders   MM DIGITAL SCREENING BILATERAL   Hyperkalemia       Relevant Orders   Potassium (Completed)   Encounter for immunization       Relevant Orders   Flu vaccine HIGH DOSE PF (Completed)   Need for pneumococcal vaccination           Einar Pheasant, MD

## 2016-10-23 ENCOUNTER — Other Ambulatory Visit: Payer: Self-pay | Admitting: Internal Medicine

## 2016-10-23 DIAGNOSIS — E876 Hypokalemia: Secondary | ICD-10-CM

## 2016-10-23 NOTE — Progress Notes (Signed)
Order placed for f/u potassium.  

## 2016-10-25 ENCOUNTER — Encounter: Payer: Self-pay | Admitting: Internal Medicine

## 2016-10-25 NOTE — Assessment & Plan Note (Signed)
H/o rectal cancer.  S/p chemo and XRT and colectomy.  Last colonoscopy 03/2015.  Recommended f/u in 5 years.  Seeing Dr Grayland Ormond.

## 2016-10-25 NOTE — Assessment & Plan Note (Signed)
Low carb diet and exercise.  Follow met b and a1c.   

## 2016-10-25 NOTE — Assessment & Plan Note (Addendum)
Followed by vascular surgery.  Results as outlined.  Recommended f/u in one year.  Last evaluated 07/2016.

## 2016-10-25 NOTE — Assessment & Plan Note (Signed)
Physical today 10/22/16.  PAP 06/09/14.  Mammogram 12/08/15 - Birads I.  Schedule f/u mammogram.

## 2016-10-25 NOTE — Assessment & Plan Note (Signed)
Seeing vascular surgery.

## 2016-10-25 NOTE — Assessment & Plan Note (Signed)
Blood pressure under good control.  Continue same medication regimen.  Follow pressures.  Follow metabolic panel.   

## 2016-10-25 NOTE — Assessment & Plan Note (Signed)
Low cholesterol diet and exercise.  Follow lipid panel.   

## 2016-11-19 ENCOUNTER — Other Ambulatory Visit (INDEPENDENT_AMBULATORY_CARE_PROVIDER_SITE_OTHER): Payer: Medicare Other

## 2016-11-19 DIAGNOSIS — I10 Essential (primary) hypertension: Secondary | ICD-10-CM

## 2016-11-19 DIAGNOSIS — E7800 Pure hypercholesterolemia, unspecified: Secondary | ICD-10-CM

## 2016-11-19 DIAGNOSIS — E876 Hypokalemia: Secondary | ICD-10-CM | POA: Diagnosis not present

## 2016-11-19 DIAGNOSIS — E119 Type 2 diabetes mellitus without complications: Secondary | ICD-10-CM | POA: Diagnosis not present

## 2016-11-19 LAB — POTASSIUM: Potassium: 5 mEq/L (ref 3.5–5.1)

## 2016-11-20 ENCOUNTER — Other Ambulatory Visit: Payer: Self-pay | Admitting: Internal Medicine

## 2016-11-20 ENCOUNTER — Telehealth: Payer: Self-pay | Admitting: Internal Medicine

## 2016-11-20 DIAGNOSIS — E875 Hyperkalemia: Secondary | ICD-10-CM

## 2016-11-20 NOTE — Telephone Encounter (Signed)
Pt called back regarding test results. Please advise, thank you!  Call pt @ [phone]

## 2016-11-20 NOTE — Telephone Encounter (Signed)
See results notes. 

## 2016-11-20 NOTE — Progress Notes (Signed)
Order placed for f/u potassium check.  

## 2016-11-26 DIAGNOSIS — H2513 Age-related nuclear cataract, bilateral: Secondary | ICD-10-CM | POA: Diagnosis not present

## 2016-12-10 ENCOUNTER — Ambulatory Visit
Admission: RE | Admit: 2016-12-10 | Discharge: 2016-12-10 | Disposition: A | Discharge status: Home / Self Care | Payer: Medicare Other | Source: Ambulatory Visit | Attending: Internal Medicine | Admitting: Internal Medicine

## 2016-12-10 DIAGNOSIS — Z1231 Encounter for screening mammogram for malignant neoplasm of breast: Secondary | ICD-10-CM | POA: Insufficient documentation

## 2016-12-10 DIAGNOSIS — Z1239 Encounter for other screening for malignant neoplasm of breast: Secondary | ICD-10-CM

## 2016-12-28 DIAGNOSIS — Z85038 Personal history of other malignant neoplasm of large intestine: Secondary | ICD-10-CM | POA: Diagnosis not present

## 2016-12-28 DIAGNOSIS — Z933 Colostomy status: Secondary | ICD-10-CM | POA: Diagnosis not present

## 2016-12-31 ENCOUNTER — Other Ambulatory Visit (INDEPENDENT_AMBULATORY_CARE_PROVIDER_SITE_OTHER): Payer: Medicare Other

## 2016-12-31 DIAGNOSIS — E875 Hyperkalemia: Secondary | ICD-10-CM | POA: Diagnosis not present

## 2016-12-31 LAB — POTASSIUM: POTASSIUM: 4.2 meq/L (ref 3.5–5.1)

## 2017-01-02 ENCOUNTER — Encounter: Payer: Self-pay | Admitting: *Deleted

## 2017-02-19 NOTE — Progress Notes (Signed)
Established Carotid Patient   History of Present Illness   Christina Mathis is a 82 y.o. (1935-02-24) female who presents with chief complaint: Left leg swelling.  Previous carotid studies demonstrated: RICA <71% stenosis, LICA <69% stenosis.  There was segment in the LICA with elevated velocities.  This was felt on CTA Neck to be due to tortuousity.  Patient has no history of TIA or stroke symptom.  The patient has never had amaurosis fugax or monocular blindness.  The patient has never had facial drooping or hemiplegia.  The patient has never had receptive or expressive aphasia.  The patient has a known history of intermittent vertigo, which sx c/w benign positional vertigo.  These episodes have been few and self limiting.  Her vertigo is gone at this point.  The patient has known R SCA stenosis.  Her sx from her R arm are: none.  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  The patient is able to walk 2 miles/day.  Her swelling in her left leg is controlled with compression stockings.  This was due to LAD related to her colectomy.  The patient's PMH, PSH, SH, and FamHx are unchanged from 07/06/16  Current Outpatient Medications  Medication Sig Dispense Refill  . alendronate (FOSAMAX) 70 MG tablet Take 1 tablet (70 mg total) by mouth every 7 (seven) days. Take with a full glass of water on an empty stomach. 4 tablet 11  . Alpha-Lipoic Acid 100 MG CAPS Take by mouth.    Marland Kitchen aspirin EC 81 MG tablet Take 81 mg by mouth daily.    . B Complex-C (SUPER B COMPLEX PO) Take 1 tablet by mouth daily.    . Biotin 1000 MCG tablet Take 1,000 mcg by mouth 3 (three) times daily.    . calcium citrate-vitamin D (CITRACAL+D) 315-200 MG-UNIT per tablet Take 1 tablet by mouth 2 (two) times daily.    . Cholecalciferol (VITAMIN D3) 5000 units CAPS Take 1 capsule by mouth daily.    Marland Kitchen glucosamine-chondroitin 500-400 MG tablet Take 1 tablet by mouth 2 (two) times daily.    Marland Kitchen  loratadine (CLARITIN) 10 MG tablet Take 10 mg by mouth daily as needed.     Marland Kitchen losartan-hydrochlorothiazide (HYZAAR) 50-12.5 MG tablet Take 1 tablet by mouth daily. 90 tablet 1  . lovastatin (MEVACOR) 20 MG tablet Take 1 tablet (20 mg total) by mouth at bedtime. (Patient taking differently: Take 10 mg by mouth at bedtime. ) 90 tablet 1  . Multiple Vitamin (MULTI-VITAMINS) TABS Take 1 tablet by mouth daily.      No current facility-administered medications for this visit.    Facility-Administered Medications Ordered in Other Visits  Medication Dose Route Frequency Provider Last Rate Last Dose  . sodium chloride flush (NS) 0.9 % injection 10 mL  10 mL Intravenous PRN Forest Gleason, MD   10 mL at 05/31/15 1040  . sodium chloride flush (NS) 0.9 % injection 10 mL  10 mL Intravenous PRN Choksi, Delorise Shiner, MD   10 mL at 07/12/15 1018   On ROS today: L leg swelling, no vertigo   Physical Examination   Vitals:   02/22/17 1054 02/22/17 1057  BP: 128/70 135/77  Pulse: 67   Resp: 16   Temp: (!) 97.3 F (36.3 C)   TempSrc: Oral   SpO2: 98%   Weight: 156 lb (70.8 kg)   Height: 5\' 4"  (1.626 m)    Body mass index is 26.78 kg/m.  General Alert, O x 3, WD, NAD  Neck Supple, mid-line trachea,    Pulmonary Sym exp, good B air movt, CTA B  Cardiac RRR, Nl S1, S2, no Murmurs, No rubs, No S3,S4  Vascular Vessel Right Left  Radial Faintly palpable Palpable  Brachial Palpable Palpable  Carotid Palpable, No Bruit Palpable, Bruit present  Aorta Not palpable N/A  Femoral Palpable Palpable  Popliteal Not palpable Not palpable  PT Faintly Palpable Faintly Palpable  DP Faintly Palpable Faintly Palpable    Gastro- intestinal soft, non-distended, non-tender to palpation, No guarding or rebound, no HSM, no masses, no CVAT B, No palpable prominent aortic pulse, Surgical incisions well healed  Musculo- skeletal M/S 5/5 throughout, Extremities without ischemic changes , B compression stockings     Neurologic Cranial nerves 2-12 intact , Pain and light touch intact in extremities except decreased in legs, Motor exam as listed above    Non-invasive Vascular Imaging     B Carotid Duplex (02/22/2017):   R ICA stenosis:  1-39%  R VA: patent and antegrade  L ICA stenosis:  1-39%  L VA: patent and antegrade   Medical Decision Making   Christina Mathis is a 82 y.o. (02/02/1935) female who presents with: asx R ICA stenosis <40%, asx L ICA stenosis ~50%, resolved vertigo, chronic LLE lymphedema due to LAD s/p colectomy   Based on the patient's vascular studies and examination, I have offered the patient: annual B carotid duplex.  I discussed in depth with the patient the nature of atherosclerosis, and emphasized the importance of maximal medical management including strict control of blood pressure, blood glucose, and lipid levels, antiplatelet agents, obtaining regular exercise, and cessation of smoking.    The patient is aware that without maximal medical management the underlying atherosclerotic disease process will progress, limiting the benefit of any interventions.  The patient is currently on a statin: Mevacor.   The patient is currently on an anti-platelet: ASA.   Thank you for allowing [place] to participate in this patient's care.   Adele Barthel, MD, FACS [facility] Office: [phone] Pager: [phone]

## 2017-02-20 ENCOUNTER — Other Ambulatory Visit: Payer: Self-pay | Admitting: Internal Medicine

## 2017-02-20 ENCOUNTER — Telehealth: Payer: Self-pay | Admitting: Radiology

## 2017-02-20 DIAGNOSIS — E7800 Pure hypercholesterolemia, unspecified: Secondary | ICD-10-CM

## 2017-02-20 DIAGNOSIS — E119 Type 2 diabetes mellitus without complications: Secondary | ICD-10-CM

## 2017-02-20 DIAGNOSIS — I10 Essential (primary) hypertension: Secondary | ICD-10-CM

## 2017-02-20 NOTE — Telephone Encounter (Signed)
Pt coming in for labs Friday please place future orders. Thank you.

## 2017-02-20 NOTE — Progress Notes (Signed)
Orders placed for labs

## 2017-02-20 NOTE — Telephone Encounter (Signed)
Orders placed for labs

## 2017-02-22 ENCOUNTER — Ambulatory Visit (HOSPITAL_COMMUNITY)
Admission: RE | Admit: 2017-02-22 | Discharge: 2017-02-22 | Disposition: A | Discharge status: Home / Self Care | Payer: Medicare Other | Source: Ambulatory Visit | Attending: Vascular Surgery | Admitting: Vascular Surgery

## 2017-02-22 ENCOUNTER — Encounter: Payer: Self-pay | Admitting: Vascular Surgery

## 2017-02-22 ENCOUNTER — Other Ambulatory Visit (INDEPENDENT_AMBULATORY_CARE_PROVIDER_SITE_OTHER): Payer: Medicare Other

## 2017-02-22 ENCOUNTER — Ambulatory Visit: Payer: Medicare Other | Admitting: Vascular Surgery

## 2017-02-22 VITALS — BP 135/77 | HR 67 | Temp 97.3°F | Resp 16 | Ht 64.0 in | Wt 156.0 lb

## 2017-02-22 DIAGNOSIS — I779 Disorder of arteries and arterioles, unspecified: Secondary | ICD-10-CM

## 2017-02-22 DIAGNOSIS — H8110 Benign paroxysmal vertigo, unspecified ear: Secondary | ICD-10-CM | POA: Diagnosis not present

## 2017-02-22 DIAGNOSIS — I6523 Occlusion and stenosis of bilateral carotid arteries: Secondary | ICD-10-CM

## 2017-02-22 DIAGNOSIS — E119 Type 2 diabetes mellitus without complications: Secondary | ICD-10-CM

## 2017-02-22 DIAGNOSIS — I10 Essential (primary) hypertension: Secondary | ICD-10-CM

## 2017-02-22 DIAGNOSIS — I6522 Occlusion and stenosis of left carotid artery: Secondary | ICD-10-CM | POA: Diagnosis not present

## 2017-02-22 DIAGNOSIS — E7800 Pure hypercholesterolemia, unspecified: Secondary | ICD-10-CM

## 2017-02-22 DIAGNOSIS — I739 Peripheral vascular disease, unspecified: Secondary | ICD-10-CM

## 2017-02-22 LAB — VAS US CAROTID
LCCADSYS: 75 cm/s
LCCAPDIAS: 11 cm/s
LEFT VERTEBRAL DIAS: 17 cm/s
LICADDIAS: -28 cm/s
LICADSYS: -150 cm/s
LICAPDIAS: -12 cm/s
LICAPSYS: -78 cm/s
Left CCA dist dias: 12 cm/s
Left CCA prox sys: 90 cm/s
RCCAPDIAS: 14 cm/s
RIGHT CCA MID DIAS: 15 cm/s
RIGHT ECA DIAS: -7 cm/s
Right CCA prox sys: 90 cm/s
Right cca dist sys: -141 cm/s

## 2017-02-22 LAB — BASIC METABOLIC PANEL
BUN: 25 mg/dL — ABNORMAL HIGH (ref 6–23)
CALCIUM: 9.1 mg/dL (ref 8.4–10.5)
CHLORIDE: 101 meq/L (ref 96–112)
CO2: 31 meq/L (ref 19–32)
Creatinine, Ser: 0.94 mg/dL (ref 0.40–1.20)
GFR: 60.99 mL/min (ref >60.00)
GLUCOSE: 143 mg/dL — AB (ref 70–99)
POTASSIUM: 4.3 meq/L (ref 3.5–5.1)
SODIUM: 140 meq/L (ref 135–145)

## 2017-02-22 LAB — LIPID PANEL
CHOL/HDL RATIO: 3
Cholesterol: 103 mg/dL (ref 0–200)
HDL: 33.9 mg/dL — AB (ref >39.00)
LDL Cholesterol: 54 mg/dL (ref 0–99)
NONHDL: 68.94
Triglycerides: 74 mg/dL (ref 0.0–149.0)
VLDL: 14.8 mg/dL (ref 0.0–40.0)

## 2017-02-22 LAB — CBC WITH DIFFERENTIAL/PLATELET
BASOS PCT: 1.4 % (ref 0.0–3.0)
Basophils Absolute: 0.1 10*3/uL (ref 0.0–0.1)
EOS PCT: 1.1 % (ref 0.0–5.0)
Eosinophils Absolute: 0.1 10*3/uL (ref 0.0–0.7)
HEMATOCRIT: 38.5 % (ref 36.0–46.0)
Hemoglobin: 13.2 g/dL (ref 12.0–15.0)
LYMPHS ABS: 0.9 10*3/uL (ref 0.7–4.0)
LYMPHS PCT: 15.4 % (ref 12.0–46.0)
MCHC: 34.2 g/dL (ref 30.0–36.0)
MCV: 103.6 fl — AB (ref 78.0–100.0)
MONOS PCT: 10 % (ref 3.0–12.0)
Monocytes Absolute: 0.6 10*3/uL (ref 0.1–1.0)
NEUTROS ABS: 4.4 10*3/uL (ref 1.4–7.7)
NEUTROS PCT: 72.1 % (ref 43.0–77.0)
PLATELETS: 281 10*3/uL (ref 150.0–400.0)
RBC: 3.72 Mil/uL — ABNORMAL LOW (ref 3.87–5.11)
RDW: 13.1 % (ref 11.5–15.5)
WBC: 6.1 10*3/uL (ref 4.0–10.5)

## 2017-02-22 LAB — HEMOGLOBIN A1C: Hgb A1c MFr Bld: 5.9 % (ref 4.6–6.5)

## 2017-02-22 LAB — HEPATIC FUNCTION PANEL
ALBUMIN: 3.7 g/dL (ref 3.5–5.2)
ALK PHOS: 49 U/L (ref 39–117)
ALT: 17 U/L (ref 0–35)
AST: 19 U/L (ref 0–37)
BILIRUBIN TOTAL: 0.8 mg/dL (ref 0.2–1.2)
Bilirubin, Direct: 0.1 mg/dL (ref 0.0–0.3)
Total Protein: 6.5 g/dL (ref 6.0–8.3)

## 2017-02-25 ENCOUNTER — Encounter: Payer: Self-pay | Admitting: Internal Medicine

## 2017-02-25 ENCOUNTER — Ambulatory Visit: Payer: Medicare Other | Admitting: Internal Medicine

## 2017-02-25 DIAGNOSIS — G629 Polyneuropathy, unspecified: Secondary | ICD-10-CM | POA: Diagnosis not present

## 2017-02-25 DIAGNOSIS — K219 Gastro-esophageal reflux disease without esophagitis: Secondary | ICD-10-CM

## 2017-02-25 DIAGNOSIS — E7800 Pure hypercholesterolemia, unspecified: Secondary | ICD-10-CM | POA: Diagnosis not present

## 2017-02-25 DIAGNOSIS — R0989 Other specified symptoms and signs involving the circulatory and respiratory systems: Secondary | ICD-10-CM

## 2017-02-25 DIAGNOSIS — I771 Stricture of artery: Secondary | ICD-10-CM

## 2017-02-25 DIAGNOSIS — C20 Malignant neoplasm of rectum: Secondary | ICD-10-CM | POA: Diagnosis not present

## 2017-02-25 DIAGNOSIS — I6523 Occlusion and stenosis of bilateral carotid arteries: Secondary | ICD-10-CM | POA: Diagnosis not present

## 2017-02-25 DIAGNOSIS — I10 Essential (primary) hypertension: Secondary | ICD-10-CM | POA: Diagnosis not present

## 2017-02-25 DIAGNOSIS — R739 Hyperglycemia, unspecified: Secondary | ICD-10-CM

## 2017-02-25 NOTE — Progress Notes (Signed)
[name], female   DOB: [birth year], 82 y.o.   MRN: [id number]   Subjective:    [name], female    DOB: Jul 05, 1935, 82 y.o.   MRN: [id number]  HPI  Patient here for a scheduled follow up.  Has a history of rectal cancer.  Sees Dr Grayland Ormond.  Has been stable.  Followed by Dr Bridgett Larsson for carotid stenosis.  Stable.  Recommended f/u in one year.   She reports she is doing well.  Feels good.  Stays active.  She does report occasionally notice a little acid reflux and clearing of her throat.  She occasionally will notice some difficulty swallowing her cholesterol pill - that she has cut in half.  States has no trouble swallowing other pills or food.  No chest pain.  No sob.  No abdominal pain.  Ostomy working well.  Discussed her supplements she is taking.     Past Medical History:  Diagnosis Date  . Colon cancer ([place])   . History of shingles    twice  . Hypercholesteremia   . Hypertension   . Left carotid bruit   . Nephrolithiasis   . Neuropathy   . Urine incontinence 03/06/2013   h/o   Past Surgical History:  Procedure Laterality Date  . APPENDECTOMY  1979   colorectal  . CESAREAN SECTION    . COLONOSCOPY WITH PROPOFOL N/A 03/21/2015   Procedure: COLONOSCOPY WITH PROPOFOL;  Surgeon: Hulen Luster, MD;  Location[facility] ENDOSCOPY;  Service: Gastroenterology;  Laterality: N/A;  . COLOSTOMY    . surgery for colorectal cancer    . VENTRAL HERNIA REPAIR Right [id number]   Family History  Problem Relation Age of Onset  . Hypertension Mother   . Hypertension Father   . Breast cancer Paternal Aunt    Social History   Socioeconomic History  . Marital status: Widowed    Spouse name: None  . Number of children: None  . Years of education: None  . Highest education level: None  Social Needs  . Financial resource strain: None  . Food insecurity - worry: None  . Food insecurity - inability: None  . Transportation needs - medical: None  . Transportation needs -  non-medical: None  Occupational History  . None  Tobacco Use  . Smoking status: Never Smoker  . Smokeless tobacco: Never Used  Substance and Sexual Activity  . Alcohol use: No    Alcohol/week: 0.0 oz  . Drug use: No  . Sexual activity: No  Other Topics Concern  . None  Social History Narrative  . None    Outpatient Encounter Medications as of 02/25/2017  Medication Sig  . alendronate (FOSAMAX) 70 MG tablet Take 1 tablet (70 mg total) by mouth every 7 (seven) days. Take with a full glass of water on an empty stomach.  . Alpha-Lipoic Acid 100 MG CAPS Take by mouth.  Marland Kitchen aspirin EC 81 MG tablet Take 81 mg by mouth daily.  . B Complex-C (SUPER B COMPLEX PO) Take 1 tablet by mouth daily.  . Biotin 1000 MCG tablet Take 1,000 mcg by mouth 3 (three) times daily.  . calcium citrate-vitamin D (CITRACAL+D) 315-200 MG-UNIT per tablet Take 1 tablet by mouth 2 (two) times daily.  . Cholecalciferol (VITAMIN D3) 5000 units CAPS Take 1 capsule by mouth daily.  Marland Kitchen glucosamine-chondroitin 500-400 MG tablet Take 1 tablet by mouth 2 (two) times daily.  Marland Kitchen loratadine (CLARITIN) 10 MG tablet Take 10 mg by  mouth daily as needed.   Marland Kitchen losartan-hydrochlorothiazide (HYZAAR) 50-12.5 MG tablet Take 1 tablet by mouth daily.  Marland Kitchen lovastatin (MEVACOR) 20 MG tablet Take 1 tablet (20 mg total) by mouth at bedtime. (Patient taking differently: Take 10 mg by mouth at bedtime. )  . Multiple Vitamin (MULTI-VITAMINS) TABS Take 1 tablet by mouth daily.    Facility-Administered Encounter Medications as of 02/25/2017  Medication  . sodium chloride flush (NS) 0.9 % injection 10 mL  . sodium chloride flush (NS) 0.9 % injection 10 mL    Review of Systems  Constitutional: Negative for appetite change and unexpected weight change.  HENT: Negative for congestion and sinus pressure.   Respiratory: Negative for cough, chest tightness and shortness of breath.   Cardiovascular: Negative for chest pain, palpitations and leg swelling.    Gastrointestinal: Negative for abdominal pain, diarrhea, nausea and vomiting.       Occasional acid reflux.   Genitourinary: Negative for difficulty urinating and dysuria.  Musculoskeletal: Negative for back pain and joint swelling.  Skin: Negative for color change and rash.  Neurological: Negative for dizziness, light-headedness and headaches.  Psychiatric/Behavioral: Negative for agitation and dysphoric mood.       Objective:     Blood pressure rechecked by me:  136/64  Physical Exam  Constitutional: She appears well-developed and well-nourished. No distress.  HENT:  Nose: Nose normal.  Mouth/Throat: Oropharynx is clear and moist.  Neck: Neck supple. No thyromegaly present.  Cardiovascular: Normal rate and regular rhythm.  Pulmonary/Chest: Breath sounds normal. No respiratory distress. She has no wheezes.  Abdominal: Soft. Bowel sounds are normal. There is no tenderness.  No pain - around ostomy.    Musculoskeletal: She exhibits no edema or tenderness.  Lymphadenopathy:    She has no cervical adenopathy.  Skin: No rash noted. No erythema.  Psychiatric: She has a normal mood and affect. Her behavior is normal.    BP 122/64 (BP Location: Left Arm, Patient Position: Sitting, Cuff Size: Normal)   Pulse 74   Temp 97.7 F (36.5 C) (Oral)   Resp 18   Wt 156 lb 11.2 oz (71.1 kg)   SpO2 97%   BMI 26.90 kg/m  Wt Readings from Last 3 Encounters:  02/25/17 156 lb 11.2 oz (71.1 kg)  02/22/17 156 lb (70.8 kg)  10/22/16 157 lb 6.4 oz (71.4 kg)     Lab Results  Component Value Date   WBC 6.1 02/22/2017   HGB 13.2 02/22/2017   HCT 38.5 02/22/2017   PLT 281.0 02/22/2017   GLUCOSE 143 (H) 02/22/2017   CHOL 103 02/22/2017   TRIG 74.0 02/22/2017   HDL 33.90 (L) 02/22/2017   LDLCALC 54 02/22/2017   ALT 17 02/22/2017   AST 19 02/22/2017   NA 140 02/22/2017   K 4.3 02/22/2017   CL 101 02/22/2017   CREATININE 0.94 02/22/2017   BUN 25 (H) 02/22/2017   CO2 31 02/22/2017    TSH 2.78 06/04/2016   INR 1.1 10/21/2012   HGBA1C 5.9 02/22/2017   MICROALBUR 1.1 10/19/2016       Assessment & Plan:   Problem List Items Addressed This Visit    Acid reflux    Symptoms as outlined.  Wanted to start zantac.  Discussed further evaluation.  She declines.  Wants to adjust her supplements.  Follow.  Will notify me if symptoms worsen or if she changes her mind.        Carotid artery disease ([place])    Followed  by vascular surgery.  Just evaluated.  Recommended f/u in one year.  Stable.       Essential hypertension    Blood pressure under good control.  Continue same medication regimen.  Follow pressures.  Follow metabolic panel.        Relevant Orders   CBC with Differential/Platelet   TSH   Basic metabolic panel   Hypercholesterolemia    On lovastatin.  Low cholesterol diet and exercise.  Follow lipid panel and liver function tests.        Relevant Orders   Hepatic function panel   Lipid panel   Hyperglycemia    Low carb diet and exercise.  Follow met b and a1c.        Relevant Orders   Hemoglobin A1c   Left carotid bruit    Followed by vascular surgery (Dr Bridgett Larsson).  Stable.  Continue lovastatin and aspirin.        Malignant neoplasm of rectum ([facility])    History of rectal cancer.  S/p chemo and XRT and colectomy.  Last colonoscopy 03/2015.  Recommended f/u in 5 years.  Followed by Dr Grayland Ormond.        Neuropathy    Taking alpha lipoic acid.  Improved.  With macrocytosis.  Check B12 with next labs.        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           Einar Pheasant, MD

## 2017-02-26 ENCOUNTER — Encounter: Payer: Self-pay | Admitting: Internal Medicine

## 2017-02-26 DIAGNOSIS — K219 Gastro-esophageal reflux disease without esophagitis: Secondary | ICD-10-CM | POA: Insufficient documentation

## 2017-02-26 NOTE — Assessment & Plan Note (Signed)
Followed by vascular surgery.  Just evaluated.  Recommended f/u in one year.  Stable.

## 2017-02-26 NOTE — Assessment & Plan Note (Signed)
Symptoms as outlined.  Wanted to start zantac.  Discussed further evaluation.  She declines.  Wants to adjust her supplements.  Follow.  Will notify me if symptoms worsen or if she changes her mind.

## 2017-02-26 NOTE — Assessment & Plan Note (Signed)
Followed by vascular surgery (Dr Bridgett Larsson).  Stable.  Continue lovastatin and aspirin.

## 2017-02-26 NOTE — Assessment & Plan Note (Signed)
On lovastatin.  Low cholesterol diet and exercise.  Follow lipid panel and liver function tests.   

## 2017-02-26 NOTE — Assessment & Plan Note (Signed)
Seeing vascular surgery.

## 2017-02-26 NOTE — Assessment & Plan Note (Signed)
Blood pressure under good control.  Continue same medication regimen.  Follow pressures.  Follow metabolic panel.   

## 2017-02-26 NOTE — Assessment & Plan Note (Signed)
Taking alpha lipoic acid.  Improved.  With macrocytosis.  Check B12 with next labs.

## 2017-02-26 NOTE — Assessment & Plan Note (Signed)
Low carb diet and exercise.  Follow met b and a1c.   

## 2017-02-26 NOTE — Assessment & Plan Note (Signed)
History of rectal cancer.  S/p chemo and XRT and colectomy.  Last colonoscopy 03/2015.  Recommended f/u in 5 years.  Followed by Dr Grayland Ormond.

## 2017-03-08 ENCOUNTER — Ambulatory Visit (INDEPENDENT_AMBULATORY_CARE_PROVIDER_SITE_OTHER): Payer: Medicare Other

## 2017-03-08 VITALS — BP 120/62 | HR 60 | Temp 98.2°F | Resp 14 | Ht 62.5 in | Wt 156.0 lb

## 2017-03-08 DIAGNOSIS — Z0000 Encounter for general adult medical examination without abnormal findings: Secondary | ICD-10-CM | POA: Diagnosis not present

## 2017-03-08 NOTE — Patient Instructions (Addendum)
  Ms. Toppin , Thank you for taking time to come for your Medicare Wellness Visit. I appreciate your ongoing commitment to your health goals. Please review the following plan we discussed and let me know if I can assist you in the future.   Follow up with Dr. Nicki Reaper as needed.    Bring a copy of your [place] and/or Living Will to be scanned into chart once completed.  Have a great day!  These are the goals we discussed: Goals    . Increase physical activity     Walk for exercise, daily 1-2 miles, increasing as tolerated       This is a list of the screening recommended for you and due dates:  Health Maintenance  Topic Date Due  . Tetanus Vaccine  09/18/1956  . Complete foot exam   06/05/2017  . Hemoglobin A1C  08/22/2017  . Mammogram  12/10/2017  . Eye exam for diabetics  12/11/2017  . Colon Cancer Screening  03/20/2020  . Flu Shot  Completed  . DEXA scan (bone density measurement)  Completed  . Pneumonia vaccines  Completed

## 2017-03-08 NOTE — Progress Notes (Signed)
Subjective:   Nathasha Fiorillo is a 82 y.o. female who presents for Medicare Annual (Subsequent) preventive examination.  Review of Systems:  No ROS.  Medicare Wellness Visit. Additional risk factors are reflected in the social history.  Cardiac Risk Factors include: hypertension;advanced age (>58men, >15 women)     Objective:     Vitals: BP 120/62 (BP Location: Left Arm, Patient Position: Sitting, Cuff Size: Normal)   Pulse 60   Temp 98.2 F (36.8 C) (Oral)   Resp 14   Ht 5' 2.5" (1.588 m)   Wt 156 lb (70.8 kg)   SpO2 98%   BMI 28.08 kg/m   Body mass index is 28.08 kg/m.  Advanced Directives 03/08/2017 07/06/2016 03/07/2016 01/11/2016 07/22/2015 07/12/2015 07/06/2015  Does Patient Have a Medical Advance Directive? No No No No No No No  Would patient like information on creating a medical advance directive? Yes (MAU/Ambulatory/Procedural Areas - Information given) - Yes (MAU/Ambulatory/Procedural Areas - Information given) No - Patient declined No - patient declined information No - patient declined information -    Tobacco Social History   Tobacco Use  Smoking Status Never Smoker  Smokeless Tobacco Never Used     Counseling given: Not Answered   Clinical Intake:                       Past Medical History:  Diagnosis Date  . Colon cancer ([place])   . History of shingles    twice  . Hypercholesteremia   . Hypertension   . Left carotid bruit   . Nephrolithiasis   . Neuropathy   . Urine incontinence 03/06/2013   h/o   Past Surgical History:  Procedure Laterality Date  . APPENDECTOMY  1979   colorectal  . CESAREAN SECTION    . COLONOSCOPY WITH PROPOFOL N/A 03/21/2015   Procedure: COLONOSCOPY WITH PROPOFOL;  Surgeon: Hulen Luster, MD;  Location: [facility] ENDOSCOPY;  Service: Gastroenterology;  Laterality: N/A;  . COLOSTOMY    . surgery for colorectal cancer    . VENTRAL HERNIA REPAIR Right [phone]   Family History  Problem Relation Age of Onset  .  Hypertension Mother   . Hypertension Father   . Breast cancer Paternal Aunt    Social History   Socioeconomic History  . Marital status: Widowed    Spouse name: None  . Number of children: None  . Years of education: None  . Highest education level: None  Social Needs  . Financial resource strain: Not hard at all  . Food insecurity - worry: Never true  . Food insecurity - inability: Never true  . Transportation needs - medical: No  . Transportation needs - non-medical: No  Occupational History  . None  Tobacco Use  . Smoking status: Never Smoker  . Smokeless tobacco: Never Used  Substance and Sexual Activity  . Alcohol use: No    Alcohol/week: 0.0 oz  . Drug use: No  . Sexual activity: No  Other Topics Concern  . None  Social History Narrative  . None    Outpatient Encounter Medications as of 03/08/2017  Medication Sig  . alendronate (FOSAMAX) 70 MG tablet Take 1 tablet (70 mg total) by mouth every 7 (seven) days. Take with a full glass of water on an empty stomach.  . Alpha-Lipoic Acid 100 MG CAPS Take by mouth.  Marland Kitchen aspirin EC 81 MG tablet Take 81 mg by mouth daily.  . B Complex-C (SUPER B COMPLEX PO)  Take 1 tablet by mouth daily.  . Biotin 1000 MCG tablet Take 1,000 mcg by mouth 3 (three) times daily.  . calcium citrate-vitamin D (CITRACAL+D) 315-200 MG-UNIT per tablet Take 1 tablet by mouth 2 (two) times daily.  . Cholecalciferol (VITAMIN D3) 5000 units CAPS Take 1 capsule by mouth daily.  Marland Kitchen glucosamine-chondroitin 500-400 MG tablet Take 1 tablet by mouth 2 (two) times daily.  Marland Kitchen loratadine (CLARITIN) 10 MG tablet Take 10 mg by mouth daily as needed.   Marland Kitchen losartan-hydrochlorothiazide (HYZAAR) 50-12.5 MG tablet Take 1 tablet by mouth daily.  Marland Kitchen lovastatin (MEVACOR) 20 MG tablet Take 1 tablet (20 mg total) by mouth at bedtime. (Patient taking differently: Take 10 mg by mouth at bedtime. )  . Multiple Vitamin (MULTI-VITAMINS) TABS Take 1 tablet by mouth daily.     Facility-Administered Encounter Medications as of 03/08/2017  Medication  . sodium chloride flush (NS) 0.9 % injection 10 mL  . sodium chloride flush (NS) 0.9 % injection 10 mL    Activities of Daily Living In your present state of health, do you have any difficulty performing the following activities: 03/08/2017  Hearing? N  Vision? N  Difficulty concentrating or making decisions? N  Walking or climbing stairs? N  Dressing or bathing? N  Doing errands, shopping? N  Preparing Food and eating ? N  Using the Toilet? N  In the past six months, have you accidently leaked urine? Y  Comment Manages with a daily pad  Do you have problems with loss of bowel control? N  Comment Colostomy in place  Managing your Medications? N  Managing your Finances? N  Housekeeping or managing your Housekeeping? N  Some recent data might be hidden    Patient Care Team: Einar Pheasant, MD as PCP - General (Internal Medicine) Regal, Tamala Fothergill, DPM as Consulting Physician (Podiatry) Lloyd Huger, MD as Consulting Physician (Oncology)    Assessment:   This is a routine wellness examination for Perline.  The goal of the wellness visit is to assist the patient how to close the gaps in care and create a preventative care plan for the patient.   The roster of all physicians providing medical care to patient is listed in the Snapshot section of the chart.  Taking calcium VIT D as appropriate/Osteopenia.  Osteoporosis reviewed.    Safety issues reviewed; lives alone. Smoke and carbon monoxide detectors in the home. No firearms or firearms locked in a safe within the home. Wears seatbelts when driving or riding with others. No violence in the home.  They do not have excessive sun exposure.  Discussed the need for sun protection: hats, long sleeves and the use of sunscreen if there is significant sun exposure.  Depression- PHQ 2 &9 complete.  There are no signs/symptoms or verbal communication  regarding depression, irritability, anhedonia, sadness/tearfullness. See depression screening.  Not currently undergoing counseling.  No previous treatment for depression.    Patient is alert, normal appearance, oriented to person/place/and time.  Correctly identified the president of the [place] and recalls of 1/3 words. Performs simple calculations and can read correct time from watch face.  Displays appropriate judgement.  No new identified risk were noted.  No failures at ADL's or IADL's.    BMI- discussed the importance of a healthy diet, water intake and the benefits of aerobic exercise. Educational material provided.   24 hour diet recall: Low carb/low cholesterol diet  Daily fluid intake: 1 cups of caffeine, 8 cups of water,  1 cups of juice/milk.  Dental- every 6 months.  Dr.Hogan.  Eye- Visual acuity not assessed per patient preference since they have regular follow up with the ophthalmologist.  Wears corrective lenses.  Sleep patterns- Sleeps 6-8 hours at night.  Wakes feeling rested.  TDAP vaccine deferred per patient preference.  Follow up with insurance.  Educational material provided.  Patient Concerns: None at this time. Follow up with PCP as needed.  Exercise Activities and Dietary recommendations Current Exercise Habits: The patient does not participate in regular exercise at present  Goals    . Increase physical activity     Walk for exercise, daily 1-2 miles, increasing as tolerated       Fall Risk Fall Risk  03/08/2017 03/07/2016 10/18/2015 06/09/2014 03/04/2014  Falls in the past year? No Yes Yes No Yes  Number falls in past yr: - 1 1 - 1  Injury with Fall? - No No - No  Follow up - Falls prevention discussed - - -   Depression Screen PHQ 2/9 Scores 03/08/2017 03/07/2016 10/18/2015 06/09/2014  PHQ - 2 Score 0 0 0 0     Cognitive Function MMSE - Mini Mental State Exam 03/08/2017  Orientation to time 5  Orientation to Place 5  Registration 3  Attention/  Calculation 5  Recall 3  Language- name 2 objects 2  Language- repeat 1  Language- follow 3 step command 3  Language- read & follow direction 1  Write a sentence 1  Copy design 1  Total score 30     6CIT Screen 03/07/2016  What Year? 0 points  What month? 0 points  What time? 0 points  Count back from 20 0 points  Months in reverse 0 points  Repeat phrase 0 points  Total Score 0    Immunization History  Administered Date(s) Administered  . Influenza, High Dose Seasonal PF 10/18/2015, 10/22/2016  . Influenza,inj,Quad PF,6+ Mos 10/12/2014  . Influenza-Unspecified 11/30/2013  . Pneumococcal Conjugate-13 10/22/2016  . Pneumococcal Polysaccharide-23 01/30/2004    Screening Tests Health Maintenance  Topic Date Due  . TETANUS/TDAP  09/18/1954  . FOOT EXAM  06/05/2017  . HEMOGLOBIN A1C  08/22/2017  . MAMMOGRAM  12/10/2017  . OPHTHALMOLOGY EXAM  12/11/2017  . COLONOSCOPY  03/20/2020  . INFLUENZA VACCINE  Completed  . DEXA SCAN  Completed  . PNA vac Low Risk Adult  Completed      Plan:   End of life planning; Advanced aging; Advanced directives discussed.  No HCPOA/Living Will.  Additional information provided to help them start the conversation with family.  Copy of HCPOA/Living Will requested upon completion. Time spent on this topic is 25 minutes.  I have personally reviewed and noted the following in the patient's chart:   . Medical and social history . Use of alcohol, tobacco or illicit drugs  . Current medications and supplements . Functional ability and status . Nutritional status . Physical activity . Advanced directives . List of other physicians . Hospitalizations, surgeries, and ER visits in previous 12 months . Vitals . Screenings to include cognitive, depression, and falls . Referrals and appointments  In addition, I have reviewed and discussed with patient certain preventive protocols, quality metrics, and best practice recommendations. A written  personalized care plan for preventive services as well as general preventive health recommendations were provided to patient.     Varney Biles, LPN  03/07/7410   Reviewed above information.  Agree with assessment and plan.    Dr  Scott

## 2017-03-11 ENCOUNTER — Ambulatory Visit: Payer: Medicare Other

## 2017-03-18 DIAGNOSIS — Z85048 Personal history of other malignant neoplasm of rectum, rectosigmoid junction, and anus: Secondary | ICD-10-CM | POA: Diagnosis not present

## 2017-03-25 DIAGNOSIS — Z85038 Personal history of other malignant neoplasm of large intestine: Secondary | ICD-10-CM | POA: Diagnosis not present

## 2017-03-25 DIAGNOSIS — Z933 Colostomy status: Secondary | ICD-10-CM | POA: Diagnosis not present

## 2017-03-27 ENCOUNTER — Other Ambulatory Visit: Payer: Self-pay | Admitting: Internal Medicine

## 2017-03-27 DIAGNOSIS — I10 Essential (primary) hypertension: Secondary | ICD-10-CM

## 2017-03-27 MED ORDER — LOSARTAN POTASSIUM-HCTZ 50-12.5 MG PO TABS: 1.0000 | ORAL_TABLET | Freq: Every day | ORAL | 1 refills | Status: DC

## 2017-03-27 NOTE — Telephone Encounter (Signed)
Copied from [place] [id number]. Topic: Quick Communication - Rx Refill/Question >> Mar 20, 2017  3:25 PM Lolita Rieger, [place] wrote: Medication: lovastatin 20 mg and losartan HCTZ 50-12.5 mg   Has the patient contacted their pharmacy? yes   (Agent: If no, request that the patient contact the pharmacy for the refill.)   Preferred Pharmacy (with phone number or street name): [facility]   Agent: Please be advised that RX refills may take up to 3 business days. We ask that you follow-up with your pharmacy. >> Mar 27, 2017  2:17 PM Scherrie Gerlach wrote: Pt calling back to folow up on request for losartan-hydrochlorothiazide (HYZAAR) 50-12.5 MG tablet lovastatin (MEVACOR) 20 MG tablet  However the lovastain needs to be changed to a 10 mg.  The dr changed at last visit.  [place], [place] [phone] (Phone) [phone] (Fax)   90 day  (I do not see this message was ever routed)

## 2017-03-27 NOTE — Telephone Encounter (Signed)
Patient called, left VM to return call to the office to let us know if she will need a prescription sent to a local pharmacy until the mail order comes in. Hyzaar filled and sent to mail order, pended Lovastatin to provider for change in dosage prescription, patient takes 10 mg daily.  Last OV: 02/25/17 PCP: [place]: [place], [place] [phone] (Phone) [phone] (Fax)

## 2017-03-28 NOTE — Telephone Encounter (Signed)
Copied from [place]. Topic: Quick Communication - Rx Refill/Question >> Mar 28, 2017  2:43 PM Lolita Rieger, [place] wrote: Medication: lovastatin 10 mg   Has the patient contacted their pharmacy? yes   (Agent: If no, request that the patient contact the pharmacy for the refill.)   Preferred Pharmacy (with phone number or street name): [facility]    Agent: Please be advised that RX refills may take up to 3 business days. We ask that you follow-up with your pharmacy.

## 2017-03-29 NOTE — Telephone Encounter (Signed)
Mevacor refill request.    There is a question as to the dose.  The notes say she is taking 10 mg but the prescription is for 20 mg.   Needs clarifying.  Dr. Nicki Reaper  [facility]  [place], [address] at [place] and [address].

## 2017-04-01 NOTE — Telephone Encounter (Signed)
Please confirm with pt dose she is taking.  Per note, is taking 10mg  q day.  Ok to refill dose she is taking.  Please correct med list.

## 2017-04-03 ENCOUNTER — Other Ambulatory Visit: Payer: Self-pay

## 2017-04-03 MED ORDER — LOVASTATIN 10 MG PO TABS: 10.0000 mg | ORAL_TABLET | Freq: Every day | ORAL | 0 refills | Status: DC

## 2017-04-03 NOTE — Telephone Encounter (Signed)
Sent in 10 mg. Patient confirmed dose.

## 2017-05-24 DIAGNOSIS — L821 Other seborrheic keratosis: Secondary | ICD-10-CM | POA: Diagnosis not present

## 2017-06-26 ENCOUNTER — Other Ambulatory Visit (INDEPENDENT_AMBULATORY_CARE_PROVIDER_SITE_OTHER): Payer: Medicare Other

## 2017-06-26 ENCOUNTER — Telehealth: Payer: Self-pay | Admitting: Internal Medicine

## 2017-06-26 DIAGNOSIS — G629 Polyneuropathy, unspecified: Secondary | ICD-10-CM | POA: Diagnosis not present

## 2017-06-26 DIAGNOSIS — I10 Essential (primary) hypertension: Secondary | ICD-10-CM

## 2017-06-26 DIAGNOSIS — E7800 Pure hypercholesterolemia, unspecified: Secondary | ICD-10-CM

## 2017-06-26 DIAGNOSIS — R739 Hyperglycemia, unspecified: Secondary | ICD-10-CM | POA: Diagnosis not present

## 2017-06-26 LAB — BASIC METABOLIC PANEL
BUN: 21 mg/dL (ref 6–23)
CALCIUM: 9.2 mg/dL (ref 8.4–10.5)
CO2: 31 mEq/L (ref 19–32)
CREATININE: 0.91 mg/dL (ref 0.40–1.20)
Chloride: 104 mEq/L (ref 96–112)
GFR: 63.26 mL/min (ref >60.00)
GLUCOSE: 144 mg/dL — AB (ref 70–99)
Potassium: 4.5 mEq/L (ref 3.5–5.1)
Sodium: 142 mEq/L (ref 135–145)

## 2017-06-26 LAB — CBC WITH DIFFERENTIAL/PLATELET
Basophils Absolute: 0.1 10*3/uL (ref 0.0–0.1)
Basophils Relative: 1.3 % (ref 0.0–3.0)
EOS ABS: 0.1 10*3/uL (ref 0.0–0.7)
EOS PCT: 1.5 % (ref 0.0–5.0)
HCT: 37.6 % (ref 36.0–46.0)
Hemoglobin: 12.9 g/dL (ref 12.0–15.0)
Lymphocytes Relative: 13.3 % (ref 12.0–46.0)
Lymphs Abs: 0.8 10*3/uL (ref 0.7–4.0)
MCHC: 34.4 g/dL (ref 30.0–36.0)
MCV: 103.6 fl — ABNORMAL HIGH (ref 78.0–100.0)
MONO ABS: 0.6 10*3/uL (ref 0.1–1.0)
Monocytes Relative: 9.3 % (ref 3.0–12.0)
NEUTROS PCT: 74.6 % (ref 43.0–77.0)
Neutro Abs: 4.6 10*3/uL (ref 1.4–7.7)
Platelets: 270 10*3/uL (ref 150.0–400.0)
RBC: 3.63 Mil/uL — AB (ref 3.87–5.11)
RDW: 13.2 % (ref 11.5–15.5)
WBC: 6.2 10*3/uL (ref 4.0–10.5)

## 2017-06-26 LAB — LIPID PANEL
CHOL/HDL RATIO: 3
Cholesterol: 108 mg/dL (ref 0–200)
HDL: 35.9 mg/dL — AB (ref >39.00)
LDL CALC: 57 mg/dL (ref 0–99)
NONHDL: 71.91
Triglycerides: 77 mg/dL (ref 0.0–149.0)
VLDL: 15.4 mg/dL (ref 0.0–40.0)

## 2017-06-26 LAB — HEPATIC FUNCTION PANEL
ALT: 19 U/L (ref 0–35)
AST: 19 U/L (ref 0–37)
Albumin: 3.8 g/dL (ref 3.5–5.2)
Alkaline Phosphatase: 47 U/L (ref 39–117)
BILIRUBIN DIRECT: 0.2 mg/dL (ref 0.0–0.3)
BILIRUBIN TOTAL: 0.8 mg/dL (ref 0.2–1.2)
Total Protein: 6.4 g/dL (ref 6.0–8.3)

## 2017-06-26 LAB — VITAMIN B12: Vitamin B-12: 700 pg/mL (ref 211–911)

## 2017-06-26 LAB — HEMOGLOBIN A1C: HEMOGLOBIN A1C: 5.8 % (ref 4.6–6.5)

## 2017-06-26 LAB — TSH: TSH: 2.36 u[IU]/mL (ref 0.35–4.50)

## 2017-06-26 NOTE — Telephone Encounter (Signed)
Signed.  Put in folder for appt.for tomorrow.

## 2017-06-26 NOTE — Telephone Encounter (Signed)
Form placed in your quick sign. Patient stated she would pick it up tomorrow at her appt.

## 2017-06-26 NOTE — Telephone Encounter (Signed)
Pt dropped off a handicapp renewal form. Pt has an appt with Dr. Nicki Reaper tomorrow. Paper is up front in Dr. Bary Leriche color folder.

## 2017-06-27 ENCOUNTER — Encounter: Payer: Self-pay | Admitting: Internal Medicine

## 2017-06-27 ENCOUNTER — Ambulatory Visit: Payer: Medicare Other | Admitting: Internal Medicine

## 2017-06-27 VITALS — BP 114/68 | HR 63 | Temp 97.8°F | Resp 18 | Wt 156.0 lb

## 2017-06-27 DIAGNOSIS — Z933 Colostomy status: Secondary | ICD-10-CM

## 2017-06-27 DIAGNOSIS — R739 Hyperglycemia, unspecified: Secondary | ICD-10-CM

## 2017-06-27 DIAGNOSIS — I10 Essential (primary) hypertension: Secondary | ICD-10-CM | POA: Diagnosis not present

## 2017-06-27 DIAGNOSIS — D7589 Other specified diseases of blood and blood-forming organs: Secondary | ICD-10-CM | POA: Diagnosis not present

## 2017-06-27 DIAGNOSIS — I6523 Occlusion and stenosis of bilateral carotid arteries: Secondary | ICD-10-CM | POA: Diagnosis not present

## 2017-06-27 DIAGNOSIS — E7800 Pure hypercholesterolemia, unspecified: Secondary | ICD-10-CM | POA: Diagnosis not present

## 2017-06-27 DIAGNOSIS — C20 Malignant neoplasm of rectum: Secondary | ICD-10-CM

## 2017-06-27 NOTE — Progress Notes (Signed)
[name], female   DOB: 11-23-1935, 82 y.o.   MRN: [id number]   Subjective:    [name], female    DOB: [birth year], 82 y.o.   MRN: [id number]  HPI  Patient here for a scheduled follow up.  Has a history of rectal cancer s/p perineal resection 03/2013.  S/p XRT and chemo.  Saw Dr Tamala Julian 03/2017.  Felt stable.  Recommended continued f/u with Dr Grayland Ormond and in surgery in one year.  Also followed by Dr Bridgett Larsson for carotid stenosis.  Last check stable.  Recommended f/u in one year.  States f/u is scheduled for this summer.  Last visit reports some problems with acid reflux and some mild dysphagia.  This has resolved.  Not an issue for her now.  No chest pain.  No sob.  No abdominal pain.  Bowels moving.     Past Medical History:  Diagnosis Date  . Colon cancer ([place])   . History of shingles    twice  . Hypercholesteremia   . Hypertension   . Left carotid bruit   . Nephrolithiasis   . Neuropathy   . Urine incontinence 03/06/2013   h/o   Past Surgical History:  Procedure Laterality Date  . APPENDECTOMY  1979   colorectal  . CESAREAN SECTION    . COLONOSCOPY WITH PROPOFOL N/A 03/21/2015   Procedure: COLONOSCOPY WITH PROPOFOL;  Surgeon: Hulen Luster, MD;  Location: [facility] ENDOSCOPY;  Service: Gastroenterology;  Laterality: N/A;  . COLOSTOMY    . surgery for colorectal cancer    . VENTRAL HERNIA REPAIR Right [phone]   Family History  Problem Relation Age of Onset  . Hypertension Mother   . Hypertension Father   . Breast cancer Paternal Aunt    Social History   Socioeconomic History  . Marital status: Widowed    Spouse name: Not on file  . Number of children: Not on file  . Years of education: Not on file  . Highest education level: Not on file  Occupational History  . Not on file  Social Needs  . Financial resource strain: Not hard at all  . Food insecurity:    Worry: Never true    Inability: Never true  . Transportation needs:    Medical: No   Non-medical: No  Tobacco Use  . Smoking status: Never Smoker  . Smokeless tobacco: Never Used  Substance and Sexual Activity  . Alcohol use: No    Alcohol/week: 0.0 oz  . Drug use: No  . Sexual activity: Never  Lifestyle  . Physical activity:    Days per week: Not on file    Minutes per session: Not on file  . Stress: Not on file  Relationships  . Social connections:    Talks on phone: Not on file    Gets together: Not on file    Attends religious service: Not on file    Active member of club or organization: Not on file    Attends meetings of clubs or organizations: Not on file    Relationship status: Not on file  Other Topics Concern  . Not on file  Social History Narrative  . Not on file    Outpatient Encounter Medications as of 06/27/2017  Medication Sig  . alendronate (FOSAMAX) 70 MG tablet Take 1 tablet (70 mg total) by mouth every 7 (seven) days. Take with a full glass of water on an empty stomach.  . Alpha-Lipoic Acid 100 MG CAPS Take  by mouth.  Marland Kitchen aspirin EC 81 MG tablet Take 81 mg by mouth daily.  . B Complex-C (SUPER B COMPLEX PO) Take 1 tablet by mouth daily.  . Biotin 1000 MCG tablet Take 1,000 mcg by mouth 3 (three) times daily.  . calcium citrate-vitamin D (CITRACAL+D) 315-200 MG-UNIT per tablet Take 1 tablet by mouth 2 (two) times daily.  . Cholecalciferol (VITAMIN D3) 5000 units CAPS Take 1 capsule by mouth daily.  Marland Kitchen glucosamine-chondroitin 500-400 MG tablet Take 1 tablet by mouth 2 (two) times daily.  Marland Kitchen losartan-hydrochlorothiazide (HYZAAR) 50-12.5 MG tablet Take 1 tablet by mouth daily.  Marland Kitchen lovastatin (MEVACOR) 10 MG tablet Take 1 tablet (10 mg total) by mouth at bedtime.  . Multiple Vitamin (MULTI-VITAMINS) TABS Take 1 tablet by mouth daily.   . [DISCONTINUED] loratadine (CLARITIN) 10 MG tablet Take 10 mg by mouth daily as needed.    Facility-Administered Encounter Medications as of 06/27/2017  Medication  . sodium chloride flush (NS) 0.9 % injection 10 mL   . sodium chloride flush (NS) 0.9 % injection 10 mL    Review of Systems  Constitutional: Negative for appetite change and unexpected weight change.  HENT: Negative for congestion and sinus pressure.   Respiratory: Negative for cough, chest tightness and shortness of breath.   Cardiovascular: Negative for chest pain, palpitations and leg swelling.  Gastrointestinal: Negative for abdominal pain, diarrhea, nausea and vomiting.  Genitourinary: Negative for difficulty urinating and dysuria.  Musculoskeletal: Negative for joint swelling and myalgias.  Skin: Negative for color change and rash.  Neurological: Negative for dizziness, light-headedness and headaches.  Psychiatric/Behavioral: Negative for agitation and dysphoric mood.       Objective:    Physical Exam  Constitutional: She appears well-developed and well-nourished. No distress.  HENT:  Nose: Nose normal.  Mouth/Throat: Oropharynx is clear and moist.  Neck: Neck supple. No thyromegaly present.  Cardiovascular: Normal rate and regular rhythm.  Pulmonary/Chest: Breath sounds normal. No respiratory distress. She has no wheezes.  Abdominal: Soft. Bowel sounds are normal. There is no tenderness.  Musculoskeletal: She exhibits no edema or tenderness.  Lymphadenopathy:    She has no cervical adenopathy.  Skin: No rash noted. No erythema.  Psychiatric: She has a normal mood and affect. Her behavior is normal.    BP 114/68 (BP Location: Left Arm, Patient Position: Sitting, Cuff Size: Normal)   Pulse 63   Temp 97.8 F (36.6 C) (Oral)   Resp 18   Wt 156 lb (70.8 kg)   SpO2 97%   BMI 28.08 kg/m  Wt Readings from Last 3 Encounters:  06/27/17 156 lb (70.8 kg)  03/08/17 156 lb (70.8 kg)  02/25/17 156 lb 11.2 oz (71.1 kg)     Lab Results  Component Value Date   WBC 6.2 06/26/2017   HGB 12.9 06/26/2017   HCT 37.6 06/26/2017   PLT 270.0 06/26/2017   GLUCOSE 144 (H) 06/26/2017   CHOL 108 06/26/2017   TRIG 77.0 06/26/2017     HDL 35.90 (L) 06/26/2017   LDLCALC 57 06/26/2017   ALT 19 06/26/2017   AST 19 06/26/2017   NA 142 06/26/2017   K 4.5 06/26/2017   CL 104 06/26/2017   CREATININE 0.91 06/26/2017   BUN 21 06/26/2017   CO2 31 06/26/2017   TSH 2.36 06/26/2017   INR 1.1 10/21/2012   HGBA1C 5.8 06/26/2017   MICROALBUR 1.1 10/19/2016       Assessment & Plan:   Problem List Items Addressed This  Visit    Carotid artery disease ([place])    Followed by vascular surgery.  Last evaluated summer of 2018.  Has f/u scheduled this summer.        Colostomy status ([place])    Ostomy working well.  Follow.       Essential hypertension    Blood pressure under good control.  Continue same medication regimen.  Follow pressures.  Follow metabolic panel.        Relevant Orders   Basic metabolic panel   Hypercholesterolemia    On lovastatin.  Low cholesterol diet and exercise.  Follow lipid panel and liver function tests.        Relevant Orders   Hepatic function panel   Lipid panel   Hyperglycemia    Low carb diet and exercise.  Follow met b and a1c.        Relevant Orders   Hemoglobin A1c   Malignant neoplasm of rectum ([facility])    History of rectal cancer.  S/p chemo and XRT and colectomy.  Last colonoscopy 03/2015.  Recommended f/u in 5 years.  Followed by Dr Grayland Ormond.  Saw Dr Tamala Julian 03/2017.  Stable.        Other Visit Diagnoses    Macrocytosis    -  Primary   Relevant Orders   CBC with Differential/Platelet       Einar Pheasant, MD

## 2017-06-30 ENCOUNTER — Encounter: Payer: Self-pay | Admitting: Internal Medicine

## 2017-06-30 DIAGNOSIS — Z933 Colostomy status: Secondary | ICD-10-CM | POA: Insufficient documentation

## 2017-06-30 NOTE — Assessment & Plan Note (Signed)
Blood pressure under good control.  Continue same medication regimen.  Follow pressures.  Follow metabolic panel.   

## 2017-06-30 NOTE — Assessment & Plan Note (Signed)
Followed by vascular surgery.  Last evaluated summer of 2018.  Has f/u scheduled this summer.

## 2017-06-30 NOTE — Assessment & Plan Note (Signed)
Low carb diet and exercise.  Follow met b and a1c.   

## 2017-06-30 NOTE — Assessment & Plan Note (Signed)
On lovastatin.  Low cholesterol diet and exercise.  Follow lipid panel and liver function tests.   

## 2017-06-30 NOTE — Assessment & Plan Note (Signed)
Ostomy working well.  Follow.  

## 2017-06-30 NOTE — Assessment & Plan Note (Signed)
History of rectal cancer.  S/p chemo and XRT and colectomy.  Last colonoscopy 03/2015.  Recommended f/u in 5 years.  Followed by Dr Grayland Ormond.  Saw Dr Tamala Julian 03/2017.  Stable.

## 2017-07-22 DIAGNOSIS — Z933 Colostomy status: Secondary | ICD-10-CM | POA: Diagnosis not present

## 2017-07-22 DIAGNOSIS — Z85038 Personal history of other malignant neoplasm of large intestine: Secondary | ICD-10-CM | POA: Diagnosis not present

## 2017-07-23 ENCOUNTER — Telehealth: Payer: Self-pay | Admitting: Internal Medicine

## 2017-07-23 ENCOUNTER — Other Ambulatory Visit: Payer: Self-pay

## 2017-07-23 MED ORDER — LOVASTATIN 10 MG PO TABS: 10.0000 mg | ORAL_TABLET | Freq: Every day | ORAL | 1 refills | Status: DC

## 2017-07-23 NOTE — Telephone Encounter (Signed)
Pt need a refill on lovastatin (MEVACOR) 10 MG tablet sent to her mail order.Marland Kitchen

## 2017-07-24 NOTE — Telephone Encounter (Signed)
Medication has been refilled.

## 2017-07-24 NOTE — Telephone Encounter (Signed)
Left message for patient

## 2017-08-11 NOTE — Progress Notes (Signed)
Christina Mathis  [phone]  [phone]     Christina Mathis DOB: December 04, 1935  MR#: [id number]  VEL#:[id number]  Patient Care Team: Einar Pheasant, MD as PCP - General (Internal Medicine) Paulla Dolly Tamala Fothergill, DPM as Consulting Physician (Podiatry) Grayland Ormond Kathlene November, MD as Consulting Physician (Oncology)  CHIEF COMPLAINT: Adenocarcinoma of the rectum.  INTERVAL HISTORY: Patient returns to clinic today for further evaluation and routine yearly follow-up.  She continues to feel well and remains asymptomatic.  She continues to have no problems with her colostomy.  She has a mild persistent neuropathy that does not affect her day-to-day activity.  She has no other neurologic complaints.  She denies any recent fevers or illnesses. She has no chest pain or shortness of breath. She has a good appetite and denies weight loss. Patient denies any blood in her stool, melena, nausea, vomiting, or diarrhea.  Patient offers no specific complaints today.  REVIEW OF SYSTEMS:   Review of Systems  Constitutional: Negative.  Negative for fever, malaise/fatigue and weight loss.  Respiratory: Negative.  Negative for cough and shortness of breath.   Cardiovascular: Negative.  Negative for chest pain and leg swelling.  Gastrointestinal: Negative.  Negative for abdominal pain, blood in stool and melena.  Genitourinary: Negative.  Negative for dysuria.  Musculoskeletal: Negative.  Negative for back pain.  Skin: Negative.  Negative for rash.  Neurological: Positive for sensory change. Negative for tingling, focal weakness, weakness and headaches.  Psychiatric/Behavioral: Negative.  The patient is not nervous/anxious.     As per HPI. Otherwise, a complete review of systems is negative.  ONCOLOGY HISTORY: Oncology History   15. 82 year old female with stage III (T3, N1, M0) adenocarcinoma of the rectum for  preop chemoradiation 2. Starting 5-FU by continuous infusion and radiation  therapy November 24, 2012 3. Patient is finishing up radiation and chemotherapy on December 15 of 2014 4. Status postsurgery and colostomyFebruary of 2015, pT0 pNO tumor stage 0 5. Post operative adjuvant with FOLFOX march 2015 6.patient finished total 6 cycles of chemotherapy with FOLFOX on August 31, 2013. 7.  January 2 016 [name] had hernia repair     PAST MEDICAL HISTORY: Past Medical History:  Diagnosis Date  . Colon cancer ([place])   . History of shingles    twice  . Hypercholesteremia   . Hypertension   . Left carotid bruit   . Nephrolithiasis   . Neuropathy   . Urine incontinence 03/06/2013   h/o    PAST SURGICAL HISTORY: Past Surgical History:  Procedure Laterality Date  . APPENDECTOMY  1979   colorectal  . CESAREAN SECTION    . COLONOSCOPY WITH PROPOFOL N/A 03/21/2015   Procedure: COLONOSCOPY WITH PROPOFOL;  Surgeon: Hulen Luster, MD;  Location: [facility] ENDOSCOPY;  Service: Gastroenterology;  Laterality: N/A;  . COLOSTOMY    . surgery for colorectal cancer    . VENTRAL HERNIA REPAIR Right [id number]    FAMILY HISTORY Family History  Problem Relation Age of Onset  . Hypertension Mother   . Hypertension Father   . Breast cancer Paternal Aunt     GYNECOLOGIC HISTORY:  No LMP recorded. Patient is postmenopausal.     ADVANCED DIRECTIVES:    HEALTH MAINTENANCE: Social History   Tobacco Use  . Smoking status: Never Smoker  . Smokeless tobacco: Never Used  Substance Use Topics  . Alcohol use: No    Alcohol/week: 0.0 oz  . Drug use: No     No Known Allergies  Current Outpatient Medications  Medication Sig Dispense Refill  . alendronate (FOSAMAX) 70 MG tablet Take 1 tablet (70 mg total) by mouth every 7 (seven) days. Take with a full glass of water on an empty stomach. 4 tablet 11  . Alpha-Lipoic Acid 100 MG CAPS Take 1 capsule by mouth daily.     Marland Kitchen aspirin EC 81 MG tablet Take 81 mg by mouth daily.    . B Complex-C (SUPER B COMPLEX PO) Take 1 tablet by mouth  daily.    . Biotin 1000 MCG tablet Take 1,000 mcg by mouth 2 (two) times daily.     . calcium citrate-vitamin D (CITRACAL+D) 315-200 MG-UNIT per tablet Take 1 tablet by mouth 2 (two) times daily.    . Cholecalciferol (VITAMIN D3) 5000 units CAPS Take 1 capsule by mouth daily.    Marland Kitchen glucosamine-chondroitin 500-400 MG tablet Take 1 tablet by mouth 2 (two) times daily.    Marland Kitchen losartan-hydrochlorothiazide (HYZAAR) 50-12.5 MG tablet Take 1 tablet by mouth daily. 90 tablet 1  . lovastatin (MEVACOR) 10 MG tablet Take 1 tablet (10 mg total) by mouth at bedtime. 90 tablet 1  . Multiple Vitamin (MULTI-VITAMINS) TABS Take 1 tablet by mouth daily.      No current facility-administered medications for this visit.    Facility-Administered Medications Ordered in Other Visits  Medication Dose Route Frequency Provider Last Rate Last Dose  . sodium chloride flush (NS) 0.9 % injection 10 mL  10 mL Intravenous PRN Forest Gleason, MD   10 mL at 05/31/15 1040  . sodium chloride flush (NS) 0.9 % injection 10 mL  10 mL Intravenous PRN Forest Gleason, MD   10 mL at 07/12/15 1018    OBJECTIVE: BP (!) 147/72   Pulse (!) 50   Temp (!) 97.4 F (36.3 C) (Oral)   Resp 18   Ht 5' 2.5" (1.588 m)   Wt 153 lb 4.8 oz (69.5 kg)   BMI 27.59 kg/m    Body mass index is 27.59 kg/m.    ECOG FS:0 - Asymptomatic  General: Well-developed, well-nourished, no acute distress. Eyes: Pink conjunctiva, anicteric sclera. HEENT: Normocephalic, moist mucous membranes. Lungs: Clear to auscultation bilaterally. Heart: Regular rate and rhythm. No rubs, murmurs, or gallops. Abdomen: Soft, nontender, nondistended. No organomegaly noted, normoactive bowel sounds.  Colostomy noted.   Musculoskeletal: No edema, cyanosis, or clubbing. Neuro: Alert, answering all questions appropriately. Cranial nerves grossly intact. Skin: No rashes or petechiae noted. Psych: Normal affect.  LAB RESULTS:  Appointment on 08/14/2017  Component Date Value Ref  Range Status  . CEA 08/14/2017 2.4  0.0 - 4.7 ng/mL Final   Comment: (NOTE)                             Nonsmokers          <3.9                             Smokers             <5.6 Roche [employer] Electrochemiluminescence Immunoassay (ECLIA) Values obtained with different assay methods or kits cannot be used interchangeably.  Results cannot be interpreted as absolute evidence of the presence or absence of malignant disease. Performed At: [facility] [place], [place] [id number] Rush Farmer MD NU:[id number]   . Sodium 08/14/2017 137  135 - 145 mmol/L Final  . Potassium  08/14/2017 4.4  3.5 - 5.1 mmol/L Final  . Chloride 08/14/2017 102  98 - 111 mmol/L Final   Please note change in reference range.  . CO2 08/14/2017 26  22 - 32 mmol/L Final  . Glucose, Bld 08/14/2017 94  70 - 99 mg/dL Final   Please note change in reference range.  . BUN 08/14/2017 26* 8 - 23 mg/dL Final   Please note change in reference range.  . Creatinine, Ser 08/14/2017 0.92  0.44 - 1.00 mg/dL Final  . Calcium 08/14/2017 9.5  8.9 - 10.3 mg/dL Final  . Total Protein 08/14/2017 6.9  6.5 - 8.1 g/dL Final  . Albumin 08/14/2017 3.9  3.5 - 5.0 g/dL Final  . AST 08/14/2017 27  15 - 41 U/L Final  . ALT 08/14/2017 25  0 - 44 U/L Final   Please note change in reference range.  . Alkaline Phosphatase 08/14/2017 53  38 - 126 U/L Final  . Total Bilirubin 08/14/2017 0.9  0.3 - 1.2 mg/dL Final  . GFR calc non Af Amer 08/14/2017 58* >60 mL/min Final  . GFR calc Af Amer 08/14/2017 >60  >60 mL/min Final   Comment: (NOTE) The eGFR has been calculated using the CKD EPI equation. This calculation has not been validated in all clinical situations. eGFR's persistently <60 mL/min signify possible Chronic Kidney Disease.   Georgiann Hahn gap 08/14/2017 9  5 - 15 Final   Performed at [facility], [address] [place], [place] [address]  . WBC 08/14/2017 7.3  3.6 - 11.0 K/uL Final  . RBC 08/14/2017  3.63* 3.80 - 5.20 MIL/uL Final  . Hemoglobin 08/14/2017 12.9  12.0 - 16.0 g/dL Final  . HCT 08/14/2017 37.3  35.0 - 47.0 % Final  . MCV 08/14/2017 102.6* 80.0 - 100.0 fL Final  . MCH 08/14/2017 35.5* 26.0 - 34.0 pg Final  . MCHC 08/14/2017 34.6  32.0 - 36.0 g/dL Final  . RDW 08/14/2017 12.8  11.5 - 14.5 % Final  . Platelets 08/14/2017 287  150 - 440 K/uL Final  . Neutrophils Relative % 08/14/2017 73  % Final  . Neutro Abs 08/14/2017 5.3  1.4 - 6.5 K/uL Final  . Lymphocytes Relative 08/14/2017 15  % Final  . Lymphs Abs 08/14/2017 1.1  1.0 - 3.6 K/uL Final  . Monocytes Relative 08/14/2017 10  % Final  . Monocytes Absolute 08/14/2017 0.7  0.2 - 0.9 K/uL Final  . Eosinophils Relative 08/14/2017 1  % Final  . Eosinophils Absolute 08/14/2017 0.1  0 - 0.7 K/uL Final  . Basophils Relative 08/14/2017 1  % Final  . Basophils Absolute 08/14/2017 0.1  0 - 0.1 K/uL Final   Performed at [facility], [address] [place], [place] [address]   Lab Results  Component Value Date   CEA 2.3 01/11/2016    STUDIES: No results found.  ASSESSMENT:  Adenocarcinoma of the rectum, stage III, T3 N1 M0.  PLAN:    1. Rectal cancer: No evidence of disease.  Patient completed 6 cycles of adjuvant FOLFOX in August 2015. Patient's most recent colonoscopy on March 21, 2015 was reported as normal with no evidence of progressive or recurrent disease.  Recommendation was to repeat in 5 years in February 2022.  CEA continues to be within normal limits at 2.4.  No intervention is needed at this time.  Return to clinic in 1 year for repeat laboratory work and further evaluation.  Patient will be 5 years  removed from completing treatment at that time.  Can consider discharging patient from clinic although she has already indicated she would like extended follow-up. 2. Lymphedema in the left lower extremity: Continue compression stockings as instructed by lymphedema clinic.  3. Peripheral neuropathy: Unchanged.  Patient has discontinued Lyrica and gabapentin. 4.  Elevated MCV: Patient's hemoglobin is within normal limits.  Likely residual from chemotherapy.  I spent a total of 20 minutes face-to-face with the patient of which greater than 50% of the visit was spent in counseling and coordination of care as detailed above.   Patient expressed understanding and was in agreement with this plan. She also understands that She can call clinic at any time with any questions, concerns, or complaints.    Lloyd Huger, MD 08/19/17 11:23 AM

## 2017-08-13 ENCOUNTER — Other Ambulatory Visit: Payer: Self-pay | Admitting: *Deleted

## 2017-08-13 DIAGNOSIS — C20 Malignant neoplasm of rectum: Secondary | ICD-10-CM

## 2017-08-14 ENCOUNTER — Encounter: Payer: Self-pay | Admitting: Oncology

## 2017-08-14 ENCOUNTER — Inpatient Hospital Stay: Payer: Medicare Other | Attending: Oncology

## 2017-08-14 ENCOUNTER — Inpatient Hospital Stay (HOSPITAL_BASED_OUTPATIENT_CLINIC_OR_DEPARTMENT_OTHER): Payer: Medicare Other | Admitting: Oncology

## 2017-08-14 VITALS — BP 147/72 | HR 50 | Temp 97.4°F | Resp 18 | Ht 62.5 in | Wt 153.3 lb

## 2017-08-14 DIAGNOSIS — I890 Lymphedema, not elsewhere classified: Secondary | ICD-10-CM

## 2017-08-14 DIAGNOSIS — Z79899 Other long term (current) drug therapy: Secondary | ICD-10-CM

## 2017-08-14 DIAGNOSIS — Z923 Personal history of irradiation: Secondary | ICD-10-CM | POA: Insufficient documentation

## 2017-08-14 DIAGNOSIS — G629 Polyneuropathy, unspecified: Secondary | ICD-10-CM | POA: Insufficient documentation

## 2017-08-14 DIAGNOSIS — I10 Essential (primary) hypertension: Secondary | ICD-10-CM | POA: Insufficient documentation

## 2017-08-14 DIAGNOSIS — Z87442 Personal history of urinary calculi: Secondary | ICD-10-CM | POA: Insufficient documentation

## 2017-08-14 DIAGNOSIS — C20 Malignant neoplasm of rectum: Secondary | ICD-10-CM

## 2017-08-14 DIAGNOSIS — Z7982 Long term (current) use of aspirin: Secondary | ICD-10-CM

## 2017-08-14 DIAGNOSIS — Z85048 Personal history of other malignant neoplasm of rectum, rectosigmoid junction, and anus: Secondary | ICD-10-CM

## 2017-08-14 DIAGNOSIS — E7800 Pure hypercholesterolemia, unspecified: Secondary | ICD-10-CM | POA: Insufficient documentation

## 2017-08-14 DIAGNOSIS — Z9221 Personal history of antineoplastic chemotherapy: Secondary | ICD-10-CM

## 2017-08-14 DIAGNOSIS — Z803 Family history of malignant neoplasm of breast: Secondary | ICD-10-CM | POA: Diagnosis not present

## 2017-08-14 LAB — CBC WITH DIFFERENTIAL/PLATELET
BASOS ABS: 0.1 10*3/uL (ref 0–0.1)
BASOS PCT: 1 %
EOS ABS: 0.1 10*3/uL (ref 0–0.7)
EOS PCT: 1 %
HCT: 37.3 % (ref 35.0–47.0)
Hemoglobin: 12.9 g/dL (ref 12.0–16.0)
Lymphocytes Relative: 15 %
Lymphs Abs: 1.1 10*3/uL (ref 1.0–3.6)
MCH: 35.5 pg — ABNORMAL HIGH (ref 26.0–34.0)
MCHC: 34.6 g/dL (ref 32.0–36.0)
MCV: 102.6 fL — ABNORMAL HIGH (ref 80.0–100.0)
MONO ABS: 0.7 10*3/uL (ref 0.2–0.9)
Monocytes Relative: 10 %
Neutro Abs: 5.3 10*3/uL (ref 1.4–6.5)
Neutrophils Relative %: 73 %
PLATELETS: 287 10*3/uL (ref 150–440)
RBC: 3.63 MIL/uL — AB (ref 3.80–5.20)
RDW: 12.8 % (ref 11.5–14.5)
WBC: 7.3 10*3/uL (ref 3.6–11.0)

## 2017-08-14 LAB — COMPREHENSIVE METABOLIC PANEL
ALBUMIN: 3.9 g/dL (ref 3.5–5.0)
ALK PHOS: 53 U/L (ref 38–126)
ALT: 25 U/L (ref 0–44)
ANION GAP: 9 (ref 5–15)
AST: 27 U/L (ref 15–41)
BILIRUBIN TOTAL: 0.9 mg/dL (ref 0.3–1.2)
BUN: 26 mg/dL — AB (ref 8–23)
CALCIUM: 9.5 mg/dL (ref 8.9–10.3)
CO2: 26 mmol/L (ref 22–32)
Chloride: 102 mmol/L (ref 98–111)
Creatinine, Ser: 0.92 mg/dL (ref 0.44–1.00)
GFR calc Af Amer: >60 mL/min (ref >60)
GFR calc non Af Amer: 58 mL/min — ABNORMAL LOW (ref >60)
GLUCOSE: 94 mg/dL (ref 70–99)
POTASSIUM: 4.4 mmol/L (ref 3.5–5.1)
Sodium: 137 mmol/L (ref 135–145)
TOTAL PROTEIN: 6.9 g/dL (ref 6.5–8.1)

## 2017-08-14 NOTE — Progress Notes (Signed)
Pt. Doing well no c/o on regards to rectal cancer

## 2017-08-15 LAB — CEA: CEA: 2.4 ng/mL (ref 0.0–4.7)

## 2017-08-16 ENCOUNTER — Other Ambulatory Visit: Payer: Self-pay | Admitting: Internal Medicine

## 2017-08-19 NOTE — Telephone Encounter (Signed)
Patient checking status, out of meds

## 2017-10-08 DIAGNOSIS — S42301A Unspecified fracture of shaft of humerus, right arm, initial encounter for closed fracture: Secondary | ICD-10-CM | POA: Insufficient documentation

## 2017-10-08 DIAGNOSIS — B029 Zoster without complications: Secondary | ICD-10-CM | POA: Insufficient documentation

## 2017-10-08 DIAGNOSIS — R638 Other symptoms and signs concerning food and fluid intake: Secondary | ICD-10-CM | POA: Insufficient documentation

## 2017-10-09 ENCOUNTER — Ambulatory Visit: Payer: Medicare Other | Admitting: Podiatry

## 2017-10-09 ENCOUNTER — Ambulatory Visit (INDEPENDENT_AMBULATORY_CARE_PROVIDER_SITE_OTHER): Payer: Medicare Other

## 2017-10-09 ENCOUNTER — Other Ambulatory Visit: Payer: Self-pay | Admitting: Podiatry

## 2017-10-09 ENCOUNTER — Encounter: Payer: Self-pay | Admitting: Podiatry

## 2017-10-09 VITALS — BP 142/65 | HR 66 | Resp 16

## 2017-10-09 DIAGNOSIS — M2042 Other hammer toe(s) (acquired), left foot: Secondary | ICD-10-CM

## 2017-10-09 DIAGNOSIS — S90122A Contusion of left lesser toe(s) without damage to nail, initial encounter: Secondary | ICD-10-CM

## 2017-10-09 DIAGNOSIS — M2041 Other hammer toe(s) (acquired), right foot: Secondary | ICD-10-CM | POA: Diagnosis not present

## 2017-10-09 NOTE — Progress Notes (Signed)
Subjective:  [name], female    DOB: December 01, 1935,  MRN: [id number] HPI Chief Complaint  Patient presents with  . Toe Pain    2nd toe bilateral - aching x years, got 2nd toe left caught few months ago and still sore, toes rub shoes, also toenails are long  . New Patient (Initial Visit)    Est pt 56    82 y.o. female presents with the above complaint.   ROS: Denies fever chills nausea vomiting muscle aches pains calf pain back pain chest pain shortness of breath.  Past Medical History:  Diagnosis Date  . Colon cancer ([place])   . History of shingles    twice  . Hypercholesteremia   . Hypertension   . Left carotid bruit   . Nephrolithiasis   . Neuropathy   . Urine incontinence 03/06/2013   h/o   Past Surgical History:  Procedure Laterality Date  . APPENDECTOMY  1979   colorectal  . CESAREAN SECTION    . COLONOSCOPY WITH PROPOFOL N/A 03/21/2015   Procedure: COLONOSCOPY WITH PROPOFOL;  Surgeon: Hulen Luster, MD;  Location: [facility] ENDOSCOPY;  Service: Gastroenterology;  Laterality: N/A;  . COLOSTOMY    . surgery for colorectal cancer    . VENTRAL HERNIA REPAIR Right [id number]    Current Outpatient Medications:  .  alendronate (FOSAMAX) 70 MG tablet, TAKE 1 TABLET BY MOUTH ONCE A WEEK *TAKE  WITH  A  FULL  GLASS  OF  WATER  ON  AN  EMPTY  STOMACH*, Disp: 4 tablet, Rfl: 11 .  Alpha-Lipoic Acid 100 MG CAPS, Take 1 capsule by mouth daily. , Disp: , Rfl:  .  aspirin EC 81 MG tablet, Take 81 mg by mouth daily., Disp: , Rfl:  .  B Complex-C (SUPER B COMPLEX PO), Take 1 tablet by mouth daily., Disp: , Rfl:  .  Biotin 1000 MCG tablet, Take 1,000 mcg by mouth 2 (two) times daily. , Disp: , Rfl:  .  calcium citrate-vitamin D (CITRACAL+D) 315-200 MG-UNIT per tablet, Take 1 tablet by mouth 2 (two) times daily., Disp: , Rfl:  .  Cholecalciferol (VITAMIN D3) 5000 units CAPS, Take 1 capsule by mouth daily., Disp: , Rfl:  .  glucosamine-chondroitin 500-400 MG tablet, Take 1 tablet  by mouth 2 (two) times daily., Disp: , Rfl:  .  losartan-hydrochlorothiazide (HYZAAR) 50-12.5 MG tablet, Take 1 tablet by mouth daily., Disp: 90 tablet, Rfl: 1 .  lovastatin (MEVACOR) 10 MG tablet, Take 1 tablet (10 mg total) by mouth at bedtime., Disp: 90 tablet, Rfl: 1 .  Multiple Vitamin (MULTI-VITAMINS) TABS, Take 1 tablet by mouth daily. , Disp: , Rfl:  No current facility-administered medications for this visit.   Facility-Administered Medications Ordered in Other Visits:  .  sodium chloride flush (NS) 0.9 % injection 10 mL, 10 mL, Intravenous, PRN, Choksi, Janak, MD, 10 mL at 05/31/15 1040 .  sodium chloride flush (NS) 0.9 % injection 10 mL, 10 mL, Intravenous, PRN, Choksi, Janak, MD, 10 mL at 07/12/15 1018  No Known Allergies Review of Systems Objective:   Vitals:   10/09/17 0915  BP: (!) 142/65  Pulse: 66  Resp: 16    General: Well developed, nourished, in no acute distress, alert and oriented x3   Dermatological: Skin is warm, dry and supple bilateral. Nails x 10 are well maintained; remaining integument appears unremarkable at this time. There are no open sores, no preulcerative lesions, no rash or signs of infection present.  Vascular: Dorsalis Pedis artery and Posterior Tibial artery pedal pulses are 2/4 bilateral with immedate capillary fill time. Pedal hair growth present. No varicosities and no lower extremity edema present bilateral.   Neruologic: Grossly intact via light touch bilateral. Vibratory intact via tuning fork bilateral. Protective threshold with Semmes Wienstein monofilament intact to all pedal sites bilateral. Patellar and Achilles deep tendon reflexes 2+ bilateral. No Babinski or clonus noted bilateral.   Musculoskeletal: No gross boney pedal deformities bilateral. No pain, crepitus, or limitation noted with foot and ankle range of motion bilateral. Muscular strength 5/5 in all groups tested bilateral.  Gait: Unassisted, Nonantalgic.     Radiographs:  Radiographs taken today demonstrate moderate to severe hallux abductovalgus deformity left greater than right with hammertoe deformity crossover and complete dislocation of the second metatarsal phalangeal joint left greater than right.  Dislocation of the PIPJ of the second toe left greater than right but is present bilaterally.  Assessment & Plan:   Assessment: Painful second toe with hallux abductovalgus left greater than right  Plan: Discussed etiology pathology conservative versus surgical therapies.  At this point she would like to have her second toe straightened.  I expressed to her that she would have to have the bunion straightened and that second toe straightened.  I expressed to her that more than likely during straightening of the second toe that we would end up with a stenosis of the blood vessels resulting in loss of the toe due to ischemia.  She understands that.  We did discuss in no uncertain terms amputation of the second toe and McBride bunionectomy of the first metatarsophalangeal joint left.  She understands this and is amenable to it.  She signed all 3 pages of the consent form after considerable discussion.  We did discuss the possible postop complications which may include but not limited to postop pain bleeding swelling infection recurrence need for further surgery.  We dispensed a Darco shoe as well as information regarding the surgery center and anesthesia group.  Tolerated this well I will follow-up with me in the near future for surgery.     Paco Cislo T. [place], [place]

## 2017-10-09 NOTE — Patient Instructions (Signed)
Pre-Operative Instructions  Congratulations, you have decided to take an important step towards improving your quality of life.  You can be assured that the doctors and staff at [facility] will be with you every step of the way.  Here are some important things you should know:  1. Plan to be at the surgery center/hospital at least 1 (one) hour prior to your scheduled time, unless otherwise directed by the surgical center/hospital staff.  You must have a responsible adult accompany you, remain during the surgery and drive you home.  Make sure you have directions to the surgical center/hospital to ensure you arrive on time. 2. If you are having surgery at [facility] or [facility] hospitals, you will need a copy of your medical history and physical form from your family physician within one month prior to the date of surgery. We will give you a form for your primary physician to complete.  3. We make every effort to accommodate the date you request for surgery.  However, there are times where surgery dates or times have to be moved.  We will contact you as soon as possible if a change in schedule is required.   4. No aspirin/ibuprofen for one week before surgery.  If you are on aspirin, any non-steroidal anti-inflammatory medications (Mobic, Aleve, Ibuprofen) should not be taken seven (7) days prior to your surgery.  You make take Tylenol for pain prior to surgery.  5. Medications - If you are taking daily heart and blood pressure medications, seizure, reflux, allergy, asthma, anxiety, pain or diabetes medications, make sure you notify the surgery center/hospital before the day of surgery so they can tell you which medications you should take or avoid the day of surgery. 6. No food or drink after midnight the night before surgery unless directed otherwise by surgical center/hospital staff. 7. No alcoholic beverages 24-hours prior to surgery.  No smoking 24-hours prior or 24-hours after  surgery. 8. Wear loose pants or shorts. They should be loose enough to fit over bandages, boots, and casts. 9. Don't wear slip-on shoes. Sneakers are preferred. 10. Bring your boot with you to the surgery center/hospital.  Also bring crutches or a walker if your physician has prescribed it for you.  If you do not have this equipment, it will be provided for you after surgery. 11. If you have not been contacted by the surgery center/hospital by the day before your surgery, call to confirm the date and time of your surgery. 12. Leave-time from work may vary depending on the type of surgery you have.  Appropriate arrangements should be made prior to surgery with your employer. 13. Prescriptions will be provided immediately following surgery by your doctor.  Fill these as soon as possible after surgery and take the medication as directed. Pain medications will not be refilled on weekends and must be approved by the doctor. 14. Remove nail polish on the operative foot and avoid getting pedicures prior to surgery. 15. Wash the night before surgery.  The night before surgery wash the foot and leg well with water and the antibacterial soap provided. Be sure to pay special attention to beneath the toenails and in between the toes.  Wash for at least three (3) minutes. Rinse thoroughly with water and dry well with a towel.  Perform this wash unless told not to do so by your physician.  Enclosed: 1 Ice pack (please put in freezer the night before surgery)   1 Hibiclens skin cleaner     Pre-op instructions  If you have any questions regarding the instructions, please do not hesitate to call our office.  [place]: [address], [place], [place] [address] -- [phone]  [place]: [address] [place], [place] [address] -- [phone]  [place]: [address]  [place], [place] [address] -- [phone]  [place]: [address], [address], [place], [place] [address] -- [phone]  Website:  [email or web address] 

## 2017-10-21 ENCOUNTER — Telehealth: Payer: Self-pay

## 2017-10-21 ENCOUNTER — Other Ambulatory Visit: Payer: Self-pay

## 2017-10-21 ENCOUNTER — Telehealth: Payer: Self-pay | Admitting: Internal Medicine

## 2017-10-21 DIAGNOSIS — I10 Essential (primary) hypertension: Secondary | ICD-10-CM

## 2017-10-21 MED ORDER — LOSARTAN POTASSIUM-HCTZ 50-12.5 MG PO TABS: 1.0000 | ORAL_TABLET | Freq: Every day | ORAL | 1 refills | Status: DC

## 2017-10-21 NOTE — Telephone Encounter (Signed)
Needs a refill on losartan-hydrochlorothiazide (HYZAAR) 50-12.5 MG tablet sent to [facility]. Pt states that she only has one pill left

## 2017-10-21 NOTE — Telephone Encounter (Signed)
Called patient to let her know that medication has been sent in

## 2017-10-21 NOTE — Telephone Encounter (Signed)
Copied from [place] [id number]. Topic: General - Other >> Oct 21, 2017  3:47 PM Mcneil, Ja-Kwan wrote: Reason for CRM: Pt returned call to office stating she just wanted to say she received the vm and she wants to thank the doctor for calling the medication in to the pharmacy.

## 2017-10-31 ENCOUNTER — Other Ambulatory Visit (INDEPENDENT_AMBULATORY_CARE_PROVIDER_SITE_OTHER): Payer: Medicare Other

## 2017-10-31 ENCOUNTER — Telehealth: Payer: Self-pay | Admitting: *Deleted

## 2017-10-31 ENCOUNTER — Encounter: Payer: Self-pay | Admitting: *Deleted

## 2017-10-31 DIAGNOSIS — E7800 Pure hypercholesterolemia, unspecified: Secondary | ICD-10-CM | POA: Diagnosis not present

## 2017-10-31 DIAGNOSIS — R739 Hyperglycemia, unspecified: Secondary | ICD-10-CM

## 2017-10-31 DIAGNOSIS — I10 Essential (primary) hypertension: Secondary | ICD-10-CM

## 2017-10-31 DIAGNOSIS — D7589 Other specified diseases of blood and blood-forming organs: Secondary | ICD-10-CM

## 2017-10-31 LAB — LIPID PANEL
Cholesterol: 105 mg/dL (ref 0–200)
HDL: 33.3 mg/dL — ABNORMAL LOW (ref >39.00)
LDL Cholesterol: 56 mg/dL (ref 0–99)
NonHDL: 71.37
Total CHOL/HDL Ratio: 3
Triglycerides: 76 mg/dL (ref 0.0–149.0)
VLDL: 15.2 mg/dL (ref 0.0–40.0)

## 2017-10-31 LAB — CBC WITH DIFFERENTIAL/PLATELET
BASOS ABS: 0.1 10*3/uL (ref 0.0–0.1)
BASOS PCT: 1 % (ref 0.0–3.0)
EOS ABS: 0.1 10*3/uL (ref 0.0–0.7)
Eosinophils Relative: 1.4 % (ref 0.0–5.0)
HCT: 36.7 % (ref 36.0–46.0)
Hemoglobin: 12.6 g/dL (ref 12.0–15.0)
Lymphocytes Relative: 13 % (ref 12.0–46.0)
Lymphs Abs: 0.9 10*3/uL (ref 0.7–4.0)
MCHC: 34.4 g/dL (ref 30.0–36.0)
MCV: 103.1 fl — ABNORMAL HIGH (ref 78.0–100.0)
Monocytes Absolute: 0.7 10*3/uL (ref 0.1–1.0)
Monocytes Relative: 9.7 % (ref 3.0–12.0)
NEUTROS ABS: 5.3 10*3/uL (ref 1.4–7.7)
NEUTROS PCT: 74.9 % (ref 43.0–77.0)
PLATELETS: 286 10*3/uL (ref 150.0–400.0)
RBC: 3.56 Mil/uL — ABNORMAL LOW (ref 3.87–5.11)
RDW: 13.2 % (ref 11.5–15.5)
WBC: 7 10*3/uL (ref 4.0–10.5)

## 2017-10-31 LAB — HEPATIC FUNCTION PANEL
ALK PHOS: 46 U/L (ref 39–117)
ALT: 21 U/L (ref 0–35)
AST: 21 U/L (ref 0–37)
Albumin: 3.7 g/dL (ref 3.5–5.2)
BILIRUBIN DIRECT: 0.1 mg/dL (ref 0.0–0.3)
BILIRUBIN TOTAL: 0.6 mg/dL (ref 0.2–1.2)
TOTAL PROTEIN: 6.4 g/dL (ref 6.0–8.3)

## 2017-10-31 LAB — BASIC METABOLIC PANEL
BUN: 24 mg/dL — AB (ref 6–23)
CO2: 32 mEq/L (ref 19–32)
Calcium: 9.1 mg/dL (ref 8.4–10.5)
Chloride: 103 mEq/L (ref 96–112)
Creatinine, Ser: 1.02 mg/dL (ref 0.40–1.20)
GFR: 55.4 mL/min — AB (ref >60.00)
GLUCOSE: 144 mg/dL — AB (ref 70–99)
Potassium: 4.1 mEq/L (ref 3.5–5.1)
Sodium: 141 mEq/L (ref 135–145)

## 2017-10-31 LAB — HEMOGLOBIN A1C: Hgb A1c MFr Bld: 5.8 % (ref 4.6–6.5)

## 2017-10-31 NOTE — Progress Notes (Signed)
A medical clearance request letter was sent to Dr. Einar Pheasant.  Patient is scheduled for surgery on 12/13/17 at [facility].

## 2017-10-31 NOTE — Telephone Encounter (Signed)
"  I finally got the nerve to give you a call to schedule my surgery with Dr. Milinda Pointer.  I had to get with my son to get some dates that would be good for him.  Can we schedule it on October 18?"  He does not have anything available on that date.  "What about November 1 or 15, does he have anything on those dates?"  He can do it on November 15.  "Okay, go ahead and put me down for then."  I will get it scheduled.  "What time will I need to get there?"  I cannot give you an exact time.  Someone from the surgical center will give you a call a day or two prior to your surgery date.  They will give you your arrival time.  "I prefer morning if possible.  Can you let them know that?"  I have written it down on your paperwork.  Dr. Milinda Pointer wants medical clearance from your primary care physician, Dr. Einar Pheasant.  "That should not be a problem, I go see her tomorrow."  Could you as her to fax a note stating you are cleared to have the surgery.  She can dictate the statement into your clinical note and fax it to [place].  "Okay, I'll ask her."  I sent a medical clearance request letter to Dr. Nicki Reaper.

## 2017-11-01 ENCOUNTER — Ambulatory Visit: Payer: Medicare Other | Admitting: Internal Medicine

## 2017-11-01 ENCOUNTER — Encounter: Payer: Self-pay | Admitting: Internal Medicine

## 2017-11-01 VITALS — BP 128/70 | HR 68 | Temp 97.9°F | Resp 17 | Ht 62.5 in | Wt 153.0 lb

## 2017-11-01 DIAGNOSIS — E7800 Pure hypercholesterolemia, unspecified: Secondary | ICD-10-CM

## 2017-11-01 DIAGNOSIS — I6523 Occlusion and stenosis of bilateral carotid arteries: Secondary | ICD-10-CM

## 2017-11-01 DIAGNOSIS — Z933 Colostomy status: Secondary | ICD-10-CM | POA: Diagnosis not present

## 2017-11-01 DIAGNOSIS — Z01818 Encounter for other preprocedural examination: Secondary | ICD-10-CM | POA: Diagnosis not present

## 2017-11-01 DIAGNOSIS — Z23 Encounter for immunization: Secondary | ICD-10-CM

## 2017-11-01 DIAGNOSIS — Z85048 Personal history of other malignant neoplasm of rectum, rectosigmoid junction, and anus: Secondary | ICD-10-CM

## 2017-11-01 DIAGNOSIS — R739 Hyperglycemia, unspecified: Secondary | ICD-10-CM

## 2017-11-01 DIAGNOSIS — I10 Essential (primary) hypertension: Secondary | ICD-10-CM

## 2017-11-01 NOTE — Progress Notes (Signed)
[name], female   DOB: December 10, 1935, 82 y.o.   MRN: [id number]   Subjective:    [name], female    DOB: Jun 10, 1935, 82 y.o.   MRN: [id number]  HPI  Patient here for a scheduled follow up and for a pre op evaluation. She reports she is doing well.  Feels good.  Tries to stay active.  States walks one mile per day.  No chest pain.  No sob.  No acid reflux.  No abdominal pain.  Bowels moving.  Saw Dr Milinda Pointer.  Planning for foot surgery.  Recently saw Dr Grayland Ormond.  Felt stable.  Recommended f/u in one year.  Has f/u scheduled for her carotids.     Past Medical History:  Diagnosis Date  . Colon cancer ([place])   . History of shingles    twice  . Hypercholesteremia   . Hypertension   . Left carotid bruit   . Nephrolithiasis   . Neuropathy   . Urine incontinence 03/06/2013   h/o   Past Surgical History:  Procedure Laterality Date  . APPENDECTOMY  1979   colorectal  . CESAREAN SECTION    . COLONOSCOPY WITH PROPOFOL N/A 03/21/2015   Procedure: COLONOSCOPY WITH PROPOFOL;  Surgeon: Hulen Luster, MD;  Location: [facility] ENDOSCOPY;  Service: Gastroenterology;  Laterality: N/A;  . COLOSTOMY    . surgery for colorectal cancer    . VENTRAL HERNIA REPAIR Right [phone]   Family History  Problem Relation Age of Onset  . Hypertension Mother   . Hypertension Father   . Breast cancer Paternal Aunt    Social History   Socioeconomic History  . Marital status: Widowed    Spouse name: Not on file  . Number of children: Not on file  . Years of education: Not on file  . Highest education level: Not on file  Occupational History  . Not on file  Social Needs  . Financial resource strain: Not hard at all  . Food insecurity:    Worry: Never true    Inability: Never true  . Transportation needs:    Medical: No    Non-medical: No  Tobacco Use  . Smoking status: Never Smoker  . Smokeless tobacco: Never Used  Substance and Sexual Activity  . Alcohol use: No   Alcohol/week: 0.0 standard drinks  . Drug use: No  . Sexual activity: Never  Lifestyle  . Physical activity:    Days per week: Not on file    Minutes per session: Not on file  . Stress: Not on file  Relationships  . Social connections:    Talks on phone: Not on file    Gets together: Not on file    Attends religious service: Not on file    Active member of club or organization: Not on file    Attends meetings of clubs or organizations: Not on file    Relationship status: Not on file  Other Topics Concern  . Not on file  Social History Narrative  . Not on file    Outpatient Encounter Medications as of 11/01/2017  Medication Sig  . alendronate (FOSAMAX) 70 MG tablet TAKE 1 TABLET BY MOUTH ONCE A WEEK *TAKE  WITH  A  FULL  GLASS  OF  WATER  ON  AN  EMPTY  STOMACH*  . Alpha-Lipoic Acid 100 MG CAPS Take 1 capsule by mouth daily.   Marland Kitchen aspirin EC 81 MG tablet Take 81 mg by mouth daily.  Marland Kitchen  B Complex-C (SUPER B COMPLEX PO) Take 1 tablet by mouth daily.  . Biotin 1000 MCG tablet Take 1,000 mcg by mouth 2 (two) times daily.   . calcium citrate-vitamin D (CITRACAL+D) 315-200 MG-UNIT per tablet Take 1 tablet by mouth 2 (two) times daily.  . Cholecalciferol (VITAMIN D3) 5000 units CAPS Take 1 capsule by mouth daily.  Marland Kitchen glucosamine-chondroitin 500-400 MG tablet Take 1 tablet by mouth 2 (two) times daily.  Marland Kitchen losartan-hydrochlorothiazide (HYZAAR) 50-12.5 MG tablet Take 1 tablet by mouth daily.  Marland Kitchen lovastatin (MEVACOR) 10 MG tablet Take 1 tablet (10 mg total) by mouth at bedtime.  . Multiple Vitamin (MULTI-VITAMINS) TABS Take 1 tablet by mouth daily.    Facility-Administered Encounter Medications as of 11/01/2017  Medication  . sodium chloride flush (NS) 0.9 % injection 10 mL  . sodium chloride flush (NS) 0.9 % injection 10 mL    Review of Systems  Constitutional: Negative for appetite change and unexpected weight change.  HENT: Negative for congestion and sinus pressure.   Respiratory:  Negative for cough, chest tightness and shortness of breath.   Cardiovascular: Negative for chest pain, palpitations and leg swelling.  Gastrointestinal: Negative for abdominal pain, diarrhea, nausea and vomiting.  Genitourinary: Negative for difficulty urinating and dysuria.  Musculoskeletal: Negative for joint swelling and myalgias.  Skin: Negative for color change and rash.  Neurological: Negative for dizziness, light-headedness and headaches.  Psychiatric/Behavioral: Negative for agitation and dysphoric mood.       Objective:    Physical Exam  Constitutional: She appears well-developed and well-nourished. No distress.  HENT:  Nose: Nose normal.  Mouth/Throat: Oropharynx is clear and moist.  Neck: Neck supple. No thyromegaly present.  Cardiovascular: Normal rate and regular rhythm.  Pulmonary/Chest: Breath sounds normal. No respiratory distress. She has no wheezes.  Abdominal: Soft. Bowel sounds are normal. There is no tenderness.  Musculoskeletal: She exhibits no edema or tenderness.  Lymphadenopathy:    She has no cervical adenopathy.  Skin: No rash noted. No erythema.  Psychiatric: She has a normal mood and affect. Her behavior is normal.    BP 128/70   Pulse 68   Temp 97.9 F (36.6 C) (Oral)   Resp 17   Ht 5' 2.5" (1.588 m)   Wt 153 lb (69.4 kg)   SpO2 97%   BMI 27.54 kg/m  Wt Readings from Last 3 Encounters:  11/01/17 153 lb (69.4 kg)  08/14/17 153 lb 4.8 oz (69.5 kg)  06/27/17 156 lb (70.8 kg)     Lab Results  Component Value Date   WBC 7.0 10/31/2017   HGB 12.6 10/31/2017   HCT 36.7 10/31/2017   PLT 286.0 10/31/2017   GLUCOSE 144 (H) 10/31/2017   CHOL 105 10/31/2017   TRIG 76.0 10/31/2017   HDL 33.30 (L) 10/31/2017   LDLCALC 56 10/31/2017   ALT 21 10/31/2017   AST 21 10/31/2017   NA 141 10/31/2017   K 4.1 10/31/2017   CL 103 10/31/2017   CREATININE 1.02 10/31/2017   BUN 24 (H) 10/31/2017   CO2 32 10/31/2017   TSH 2.36 06/26/2017   INR 1.1  10/21/2012   HGBA1C 5.8 10/31/2017   MICROALBUR 1.1 10/19/2016       Assessment & Plan:   Problem List Items Addressed This Visit    Carotid artery disease ([place])    Has been followed by vascular surgery.  Has f/u scheduled.        Colostomy status ([place])    Ostomy working  well.  Follow.        Essential hypertension    Blood pressure under good control.  Continue same medication regimen.  Follow pressures.  Follow metabolic panel.        Relevant Orders   Basic metabolic panel   History of rectal cancer    History of rectal cancer.  S/p chemo and XRT and colectomy.  Last colonoscopy 03/2015.  Recommended f/u in 5 years.  Followed by Dr Grayland Ormond.  Stable.  Recommended f/u in one year.        Hypercholesterolemia    On lovastatin.  Low cholesterol diet and exercise.  Follow lipid panel and liver function tests.        Hyperglycemia    Low carb diet and exercise.  Follow met b and a1c.        Pre-op evaluation    Planning for foot surgery.  Pt denies chest pain and sob.  EKG - SR with no acute ischemic changes.  I feel she is at low risk from a cardiac standpoint to proceed with the planned surgery.  She will need close intra op and post op monitoring of her heart rate and blood pressure to avoid extremes.         Other Visit Diagnoses    Preoperative clearance    -  Primary   Relevant Orders   EKG 12-Lead (Completed)   Encounter for immunization       Relevant Orders   Flu vaccine HIGH DOSE PF (Completed)       Einar Pheasant, MD

## 2017-11-09 ENCOUNTER — Encounter: Payer: Self-pay | Admitting: Internal Medicine

## 2017-11-09 DIAGNOSIS — Z01818 Encounter for other preprocedural examination: Secondary | ICD-10-CM | POA: Insufficient documentation

## 2017-11-09 NOTE — Assessment & Plan Note (Signed)
Has been followed by vascular surgery.  Has f/u scheduled.

## 2017-11-09 NOTE — Assessment & Plan Note (Signed)
Blood pressure under good control.  Continue same medication regimen.  Follow pressures.  Follow metabolic panel.   

## 2017-11-09 NOTE — Assessment & Plan Note (Signed)
On lovastatin.  Low cholesterol diet and exercise.  Follow lipid panel and liver function tests.   

## 2017-11-09 NOTE — Assessment & Plan Note (Signed)
Low carb diet and exercise.  Follow met b and a1c.   

## 2017-11-09 NOTE — Assessment & Plan Note (Signed)
Planning for foot surgery.  Pt denies chest pain and sob.  EKG - SR with no acute ischemic changes.  I feel she is at low risk from a cardiac standpoint to proceed with the planned surgery.  She will need close intra op and post op monitoring of her heart rate and blood pressure to avoid extremes.

## 2017-11-09 NOTE — Assessment & Plan Note (Signed)
History of rectal cancer.  S/p chemo and XRT and colectomy.  Last colonoscopy 03/2015.  Recommended f/u in 5 years.  Followed by Dr Grayland Ormond.  Stable.  Recommended f/u in one year.

## 2017-11-09 NOTE — Assessment & Plan Note (Signed)
Ostomy working well.  Follow.  

## 2017-11-11 ENCOUNTER — Other Ambulatory Visit: Payer: Self-pay | Admitting: Internal Medicine

## 2017-11-11 DIAGNOSIS — Z1231 Encounter for screening mammogram for malignant neoplasm of breast: Secondary | ICD-10-CM

## 2017-11-15 ENCOUNTER — Other Ambulatory Visit (INDEPENDENT_AMBULATORY_CARE_PROVIDER_SITE_OTHER): Payer: Medicare Other

## 2017-11-15 DIAGNOSIS — I10 Essential (primary) hypertension: Secondary | ICD-10-CM | POA: Diagnosis not present

## 2017-11-15 LAB — BASIC METABOLIC PANEL
BUN: 22 mg/dL (ref 6–23)
CHLORIDE: 104 meq/L (ref 96–112)
CO2: 31 mEq/L (ref 19–32)
Calcium: 9.4 mg/dL (ref 8.4–10.5)
Creatinine, Ser: 0.96 mg/dL (ref 0.40–1.20)
GFR: 59.41 mL/min — AB (ref >60.00)
Glucose, Bld: 119 mg/dL — ABNORMAL HIGH (ref 70–99)
POTASSIUM: 4.6 meq/L (ref 3.5–5.1)
SODIUM: 142 meq/L (ref 135–145)

## 2017-12-04 DIAGNOSIS — H2511 Age-related nuclear cataract, right eye: Secondary | ICD-10-CM | POA: Diagnosis not present

## 2017-12-05 DIAGNOSIS — Z85038 Personal history of other malignant neoplasm of large intestine: Secondary | ICD-10-CM | POA: Diagnosis not present

## 2017-12-05 DIAGNOSIS — Z933 Colostomy status: Secondary | ICD-10-CM | POA: Diagnosis not present

## 2017-12-10 ENCOUNTER — Other Ambulatory Visit: Payer: Self-pay | Admitting: Podiatry

## 2017-12-10 MED ORDER — OXYCODONE-ACETAMINOPHEN 10-325 MG PO TABS: 1.0000 | ORAL_TABLET | ORAL | 0 refills | Status: DC | PRN

## 2017-12-10 MED ORDER — ONDANSETRON HCL 4 MG PO TABS: 4.0000 mg | ORAL_TABLET | Freq: Three times a day (TID) | ORAL | 0 refills | Status: DC | PRN

## 2017-12-10 MED ORDER — CEPHALEXIN 500 MG PO CAPS: 500.0000 mg | ORAL_CAPSULE | Freq: Three times a day (TID) | ORAL | 0 refills | Status: DC

## 2017-12-11 ENCOUNTER — Ambulatory Visit
Admission: RE | Admit: 2017-12-11 | Discharge: 2017-12-11 | Disposition: A | Discharge status: Home / Self Care | Payer: Medicare Other | Source: Ambulatory Visit | Attending: Internal Medicine | Admitting: Internal Medicine

## 2017-12-11 DIAGNOSIS — Z1231 Encounter for screening mammogram for malignant neoplasm of breast: Secondary | ICD-10-CM | POA: Diagnosis not present

## 2017-12-11 HISTORY — DX: Personal history of irradiation: Z92.3

## 2017-12-11 HISTORY — DX: Personal history of antineoplastic chemotherapy: Z92.21

## 2017-12-13 ENCOUNTER — Encounter: Payer: Self-pay | Admitting: Podiatry

## 2017-12-13 ENCOUNTER — Other Ambulatory Visit: Payer: Self-pay | Admitting: Podiatry

## 2017-12-13 DIAGNOSIS — E7800 Pure hypercholesterolemia, unspecified: Secondary | ICD-10-CM | POA: Diagnosis not present

## 2017-12-13 DIAGNOSIS — M79675 Pain in left toe(s): Secondary | ICD-10-CM | POA: Diagnosis not present

## 2017-12-13 DIAGNOSIS — M2042 Other hammer toe(s) (acquired), left foot: Secondary | ICD-10-CM | POA: Diagnosis not present

## 2017-12-13 DIAGNOSIS — M2012 Hallux valgus (acquired), left foot: Secondary | ICD-10-CM | POA: Diagnosis not present

## 2017-12-13 DIAGNOSIS — M25572 Pain in left ankle and joints of left foot: Secondary | ICD-10-CM | POA: Diagnosis not present

## 2017-12-13 MED ORDER — OXYCODONE-ACETAMINOPHEN 5-325 MG PO TABS: 1.0000 | ORAL_TABLET | ORAL | 0 refills | Status: DC | PRN

## 2017-12-13 NOTE — Progress Notes (Signed)
[facility] wound not fill percocet prescription as written. Sent percocet 5/325 to the pharmacy. Unable to contact anyone at [facility]. I tried multiple attempts.   Trula Slade

## 2017-12-17 ENCOUNTER — Telehealth: Payer: Self-pay | Admitting: Podiatry

## 2017-12-17 NOTE — Telephone Encounter (Signed)
Patient was returning a call, not sure who called her. But, while she was on the phone, she had a few questions. "I just would like to know if I need to keep this boot on 24hrs a day." "Also, I was wondering if I should resume my medication, I started taking my high blood pressure medicine, but can I resume my multivitamins and my other medicine." " I havent had much pain to take the Percocet." "Also, it feels kind of like pins sticking in the top of my foot near the bandage." " You can just give me a call back at your conveince, I was just returning the call."

## 2017-12-23 ENCOUNTER — Encounter: Payer: Medicare Other | Admitting: Podiatry

## 2017-12-23 ENCOUNTER — Ambulatory Visit (INDEPENDENT_AMBULATORY_CARE_PROVIDER_SITE_OTHER): Payer: Medicare Other

## 2017-12-23 ENCOUNTER — Encounter: Payer: Self-pay | Admitting: Podiatry

## 2017-12-23 ENCOUNTER — Ambulatory Visit (INDEPENDENT_AMBULATORY_CARE_PROVIDER_SITE_OTHER): Payer: Medicare Other | Admitting: Podiatry

## 2017-12-23 VITALS — BP 129/61 | HR 68 | Temp 98.5°F

## 2017-12-23 DIAGNOSIS — M2012 Hallux valgus (acquired), left foot: Secondary | ICD-10-CM

## 2017-12-23 DIAGNOSIS — Z9889 Other specified postprocedural states: Secondary | ICD-10-CM

## 2017-12-23 DIAGNOSIS — M2042 Other hammer toe(s) (acquired), left foot: Secondary | ICD-10-CM

## 2017-12-24 NOTE — Progress Notes (Signed)
She presents today for her postop visit date of surgery December 13, 2017 McBride bunionectomy and amputation of the second toe at the metatarsal phalangeal joint left foot.  She states that is doing good I have had some pins and needle type pains and sensations in the top across the top of my foot but all in all it seems to be doing pretty well.  She denies fever chills nausea vomiting muscle aches pains.  Objective: Vital signs stable alert and oriented x3 dry sterile dressing intact was removed demonstrates hallux valgus with reduction of the hypertrophic medial condyle of the first metatarsal with McBride bunionectomy and an amputation of the second toe left.  There is no signs of infection.  No erythema edema cellulitis drainage or odor sutures are intact margins appear to be well coapted.  Assessment: Well-healing surgical foot status post McBride and amputation of the second toe left.  Plan: Redressed today dressed a compressive dressing follow-up with [place] in about 10 days for suture removal.  She cannot get this wet she is to keep it dry and elevated.

## 2018-01-01 ENCOUNTER — Ambulatory Visit (INDEPENDENT_AMBULATORY_CARE_PROVIDER_SITE_OTHER): Payer: Medicare Other | Admitting: Podiatry

## 2018-01-01 ENCOUNTER — Encounter: Payer: Self-pay | Admitting: Podiatry

## 2018-01-01 DIAGNOSIS — M2012 Hallux valgus (acquired), left foot: Secondary | ICD-10-CM

## 2018-01-01 DIAGNOSIS — Z9889 Other specified postprocedural states: Secondary | ICD-10-CM

## 2018-01-01 DIAGNOSIS — M2042 Other hammer toe(s) (acquired), left foot: Secondary | ICD-10-CM

## 2018-01-01 MED ORDER — DOXYCYCLINE HYCLATE 100 MG PO TABS: 100.0000 mg | ORAL_TABLET | Freq: Two times a day (BID) | ORAL | 0 refills | Status: DC

## 2018-01-02 NOTE — Progress Notes (Signed)
Presents today for second postop visit date of surgery December 13, 2017 status post McBride bunionectomy and amputation of the second toe she states it feels pretty good it is just prickly.  Denies fever chills nausea vomiting muscle aches and pains.  Objective: Vital signs are stable she is alert and oriented x3 is minimal edema no erythema cellulitis drainage or odor sutures are intact margins are not quite coapted yet over the bunion parts and will leave those sutures and a little bit longer  Assessment: Well-healing surgical foot.  Plan: Follow-up with her in 1 week to remove sutures.

## 2018-01-08 ENCOUNTER — Encounter: Payer: Self-pay | Admitting: Podiatry

## 2018-01-08 ENCOUNTER — Ambulatory Visit (INDEPENDENT_AMBULATORY_CARE_PROVIDER_SITE_OTHER): Payer: Medicare Other | Admitting: Podiatry

## 2018-01-08 DIAGNOSIS — M2042 Other hammer toe(s) (acquired), left foot: Secondary | ICD-10-CM

## 2018-01-08 DIAGNOSIS — M2012 Hallux valgus (acquired), left foot: Secondary | ICD-10-CM

## 2018-01-08 DIAGNOSIS — Z9889 Other specified postprocedural states: Secondary | ICD-10-CM

## 2018-01-08 NOTE — Progress Notes (Signed)
Presents today for follow-up of her Christina Mathis date of surgery December 13, 2017 and amputation of the second toe on the left foot.  She denies fever chills nausea muscle aches and pains.  Objective: Vital signs are stable oriented x3.  There is no erythema edema cellulitis drainage odor sutures intact margins well coapted.  Assessment: Removal of all sutures.  Plan: Follow-up with me in about 1 month.

## 2018-01-15 ENCOUNTER — Encounter: Payer: Medicare Other | Admitting: Podiatry

## 2018-01-20 ENCOUNTER — Other Ambulatory Visit: Payer: Self-pay | Admitting: Podiatry

## 2018-01-27 ENCOUNTER — Telehealth: Payer: Self-pay | Admitting: Podiatry

## 2018-01-27 ENCOUNTER — Other Ambulatory Visit: Payer: Self-pay | Admitting: Internal Medicine

## 2018-01-27 NOTE — Telephone Encounter (Signed)
Patient received a refill from her Pharmacy and wasent sure if you had sent in for a refill on a medication. "I wasent aware that I was to continue taking the medication." Please call me after 1pm".

## 2018-01-27 NOTE — Telephone Encounter (Signed)
Requested medication (s) are due for refill today: yes  Requested medication (s) are on the active medication list: yes  Last refill:  07/23/17 for 90 tabs and 1 refill  Future visit scheduled: yes  Notes to clinic:  antilipid - statins failed.  Requested Prescriptions  Pending Prescriptions Disp Refills   lovastatin (MEVACOR) 10 MG tablet 90 tablet 1    Sig: Take 1 tablet (10 mg total) by mouth at bedtime.     Cardiovascular:  Antilipid - Statins Failed - 01/27/2018 11:13 AM      Failed - HDL in normal range and within 360 days    HDL  Date Value Ref Range Status  10/31/2017 33.30 (L) >39.00 mg/dL Final         Passed - Total Cholesterol in normal range and within 360 days    Cholesterol  Date Value Ref Range Status  10/31/2017 105 0 - 200 mg/dL Final    Comment:    ATP III Classification       Desirable:  < 200 mg/dL               Borderline High:  200 - 239 mg/dL          High:  > = 240 mg/dL         Passed - LDL in normal range and within 360 days    LDL Cholesterol  Date Value Ref Range Status  10/31/2017 56 0 - 99 mg/dL Final         Passed - Triglycerides in normal range and within 360 days    Triglycerides  Date Value Ref Range Status  10/31/2017 76.0 0.0 - 149.0 mg/dL Final    Comment:    Normal:  <150 mg/dLBorderline High:  150 - 199 mg/dL         Passed - Patient is not pregnant      Passed - Valid encounter within last 12 months    Recent Outpatient Visits          2 months ago Preoperative clearance   [facility] [place], Randell Patient, MD   7 months ago Macrocytosis   [facility] [place], Randell Patient, MD   11 months ago Hypercholesterolemia   [facility], MD   1 year ago Routine general medical examination at a health care facility   [facility], Randell Patient, MD   1 year ago Malignant neoplasm of colon, unspecified part of colon [facility])   [facility], MD      Future Appointments            In 1 month O'Brien-Blaney, Bryson Corona, LPN [place], [place]   In 1 month Einar Pheasant, MD [place], [place]

## 2018-01-27 NOTE — Telephone Encounter (Signed)
Copied from [employer] [id number]. Topic: Quick Communication - Rx Refill/Question >> Jan 27, 2018  9:46 AM Antonieta Iba C wrote: Medication: lovastatin (MEVACOR) 10 MG tablet  Has the patient contacted their pharmacy? Yes- Mailer order   (Agent: If no, request that the patient contact the pharmacy for the refill.) (Agent: If yes, when and what did the pharmacy advise?)  Preferred Pharmacy (with phone number or street name): [facility] - [place], [place] - [address] AT [place]  Agent: Please be advised that RX refills may take up to 3 business days. We ask that you follow-up with your pharmacy.

## 2018-01-28 ENCOUNTER — Encounter: Payer: Medicare Other | Admitting: Podiatry

## 2018-01-28 ENCOUNTER — Telehealth: Payer: Self-pay | Admitting: Podiatry

## 2018-01-28 MED ORDER — LOVASTATIN 10 MG PO TABS: 10.0000 mg | ORAL_TABLET | Freq: Every day | ORAL | 1 refills | Status: DC

## 2018-01-28 NOTE — Telephone Encounter (Signed)
error 

## 2018-01-28 NOTE — Telephone Encounter (Signed)
Pt called again wanting to follow up on refill

## 2018-01-28 NOTE — Telephone Encounter (Signed)
I returned patient call and informed her via voice mail to go ahead and finish all the antibiotic sent to pharmacy and if any concerns, she should call us back.

## 2018-02-10 ENCOUNTER — Encounter: Payer: Self-pay | Admitting: Podiatry

## 2018-02-10 ENCOUNTER — Ambulatory Visit: Payer: Medicare Other

## 2018-02-10 ENCOUNTER — Ambulatory Visit (INDEPENDENT_AMBULATORY_CARE_PROVIDER_SITE_OTHER): Payer: Medicare Other | Admitting: Podiatry

## 2018-02-10 DIAGNOSIS — Z9889 Other specified postprocedural states: Secondary | ICD-10-CM

## 2018-02-10 DIAGNOSIS — M2042 Other hammer toe(s) (acquired), left foot: Secondary | ICD-10-CM

## 2018-02-10 DIAGNOSIS — M2012 Hallux valgus (acquired), left foot: Secondary | ICD-10-CM

## 2018-02-10 NOTE — Progress Notes (Signed)
She presents today date of surgery 12/13/2017 status post McBride bunionectomy and amputation of the second toe.  She is concerned that her hallux is going to flop over and leave a large bump on the medial aspect of her foot.  Objective: Vital signs are stable she is alert and oriented x3.  It has not moved since the day we performed the surgery.  She appears to be doing very well there is no erythema no mild no edema no cellulitis drainage or odor.  Assessment: Well-healing surgical foot.  Plan: Provided her with spacers.  Follow-up with her as needed.

## 2018-03-10 ENCOUNTER — Ambulatory Visit: Payer: Medicare Other

## 2018-03-13 ENCOUNTER — Other Ambulatory Visit: Payer: Self-pay | Admitting: Internal Medicine

## 2018-03-13 DIAGNOSIS — E7800 Pure hypercholesterolemia, unspecified: Secondary | ICD-10-CM

## 2018-03-13 DIAGNOSIS — D7589 Other specified diseases of blood and blood-forming organs: Secondary | ICD-10-CM

## 2018-03-13 DIAGNOSIS — R739 Hyperglycemia, unspecified: Secondary | ICD-10-CM

## 2018-03-13 NOTE — Progress Notes (Unsigned)
Order placed for f/u labs.  

## 2018-03-14 ENCOUNTER — Ambulatory Visit (INDEPENDENT_AMBULATORY_CARE_PROVIDER_SITE_OTHER): Payer: Medicare Other

## 2018-03-14 ENCOUNTER — Other Ambulatory Visit: Payer: Medicare Other

## 2018-03-14 VITALS — BP 122/72 | HR 62 | Temp 97.8°F | Resp 14 | Ht 62.5 in | Wt 148.1 lb

## 2018-03-14 DIAGNOSIS — Z0000 Encounter for general adult medical examination without abnormal findings: Secondary | ICD-10-CM | POA: Diagnosis not present

## 2018-03-14 DIAGNOSIS — D7589 Other specified diseases of blood and blood-forming organs: Secondary | ICD-10-CM | POA: Diagnosis not present

## 2018-03-14 DIAGNOSIS — E7800 Pure hypercholesterolemia, unspecified: Secondary | ICD-10-CM | POA: Diagnosis not present

## 2018-03-14 DIAGNOSIS — R739 Hyperglycemia, unspecified: Secondary | ICD-10-CM

## 2018-03-14 LAB — BASIC METABOLIC PANEL
BUN: 24 mg/dL — AB (ref 6–23)
CO2: 30 mEq/L (ref 19–32)
Calcium: 9.3 mg/dL (ref 8.4–10.5)
Chloride: 104 mEq/L (ref 96–112)
Creatinine, Ser: 0.95 mg/dL (ref 0.40–1.20)
GFR: 56.53 mL/min — ABNORMAL LOW (ref >60.00)
Glucose, Bld: 116 mg/dL — ABNORMAL HIGH (ref 70–99)
Potassium: 4.5 mEq/L (ref 3.5–5.1)
Sodium: 142 mEq/L (ref 135–145)

## 2018-03-14 LAB — HEMOGLOBIN A1C: Hgb A1c MFr Bld: 5.7 % (ref 4.6–6.5)

## 2018-03-14 LAB — HEPATIC FUNCTION PANEL
ALT: 21 U/L (ref 0–35)
AST: 28 U/L (ref 0–37)
Albumin: 3.8 g/dL (ref 3.5–5.2)
Alkaline Phosphatase: 50 U/L (ref 39–117)
BILIRUBIN TOTAL: 0.7 mg/dL (ref 0.2–1.2)
Bilirubin, Direct: 0.2 mg/dL (ref 0.0–0.3)
Total Protein: 6.5 g/dL (ref 6.0–8.3)

## 2018-03-14 LAB — LIPID PANEL
Cholesterol: 114 mg/dL (ref 0–200)
HDL: 35.8 mg/dL — ABNORMAL LOW (ref >39.00)
LDL Cholesterol: 62 mg/dL (ref 0–99)
NonHDL: 77.96
Total CHOL/HDL Ratio: 3
Triglycerides: 81 mg/dL (ref 0.0–149.0)
VLDL: 16.2 mg/dL (ref 0.0–40.0)

## 2018-03-14 NOTE — Patient Instructions (Addendum)
  Christina Mathis , Thank you for taking time to come for your Medicare Wellness Visit. I appreciate your ongoing commitment to your health goals. Please review the following plan we discussed and let me know if I can assist you in the future.   Labs today  Follow up as needed.    Happy Valentine's Day!  These are the goals we discussed: Goals      Patient Stated   . Increase physical activity (pt-stated)     Walk for exercise 4-5 days, 30 minutes       This is a list of the screening recommended for you and due dates:  Health Maintenance  Topic Date Due  . Tetanus Vaccine  09/18/1954  . Complete foot exam   06/05/2017  . Eye exam for diabetics  12/11/2017  . Hemoglobin A1C  05/02/2018  . Mammogram  12/12/2018  . Colon Cancer Screening  03/20/2020  . Flu Shot  Completed  . DEXA scan (bone density measurement)  Completed  . Pneumonia vaccines  Completed

## 2018-03-14 NOTE — Progress Notes (Signed)
Subjective:   Christina Mathis is a 83 y.o. female who presents for Medicare Annual (Subsequent) preventive examination.  Review of Systems:  No ROS.  Medicare Wellness Visit. Additional risk factors are reflected in the social history. Cardiac Risk Factors include: advanced age (>56men, >85 women)     Objective:     Vitals: BP 122/72 (BP Location: Left Arm, Patient Position: Sitting, Cuff Size: Normal)   Pulse 62   Temp 97.8 F (36.6 C) (Oral)   Resp 14   Ht 5' 2.5" (1.588 m)   Wt 148 lb 1.9 oz (67.2 kg)   SpO2 98%   BMI 26.66 kg/m   Body mass index is 26.66 kg/m.  Advanced Directives 03/14/2018 08/14/2017 03/08/2017 07/06/2016 03/07/2016 01/11/2016 07/22/2015  Does Patient Have a Medical Advance Directive? No No No No No No No  Would patient like information on creating a medical advance directive? No - Patient declined No - Patient declined Yes (MAU/Ambulatory/Procedural Areas - Information given) - Yes (MAU/Ambulatory/Procedural Areas - Information given) No - Patient declined No - patient declined information    Tobacco Social History   Tobacco Use  Smoking Status Never Smoker  Smokeless Tobacco Never Used     Counseling given: Not Answered   Clinical Intake:  Pre-visit preparation completed: Yes        Diabetes: No  How often do you need to have someone help you when you read instructions, pamphlets, or other written materials from your doctor or pharmacy?: 1 - Never  Interpreter Needed?: No     Past Medical History:  Diagnosis Date  . Colon cancer ([place])   . History of shingles    twice  . Hypercholesteremia   . Hypertension   . Left carotid bruit   . Nephrolithiasis   . Neuropathy   . Personal history of chemotherapy 2014   Colon  . Personal history of radiation therapy 2014   Colon  . Urine incontinence 03/06/2013   h/o   Past Surgical History:  Procedure Laterality Date  . APPENDECTOMY  1979   colorectal  . CESAREAN SECTION    .  COLONOSCOPY WITH PROPOFOL N/A 03/21/2015   Procedure: COLONOSCOPY WITH PROPOFOL;  Surgeon: Hulen Luster, MD;  Location: [facility] ENDOSCOPY;  Service: Gastroenterology;  Laterality: N/A;  . COLOSTOMY    . surgery for colorectal cancer    . VENTRAL HERNIA REPAIR Right [phone]   Family History  Problem Relation Age of Onset  . Hypertension Mother   . Hypertension Father   . Breast cancer Paternal Aunt    Social History   Socioeconomic History  . Marital status: Widowed    Spouse name: Not on file  . Number of children: Not on file  . Years of education: Not on file  . Highest education level: Not on file  Occupational History  . Not on file  Social Needs  . Financial resource strain: Not hard at all  . Food insecurity:    Worry: Never true    Inability: Never true  . Transportation needs:    Medical: No    Non-medical: No  Tobacco Use  . Smoking status: Never Smoker  . Smokeless tobacco: Never Used  Substance and Sexual Activity  . Alcohol use: No    Alcohol/week: 0.0 standard drinks  . Drug use: No  . Sexual activity: Never  Lifestyle  . Physical activity:    Days per week: 4 days    Minutes per session: 30 min  .  Stress: Not at all  Relationships  . Social connections:    Talks on phone: Not on file    Gets together: Not on file    Attends religious service: Not on file    Active member of club or organization: Not on file    Attends meetings of clubs or organizations: Not on file    Relationship status: Not on file  Other Topics Concern  . Not on file  Social History Narrative  . Not on file    Outpatient Encounter Medications as of 03/14/2018  Medication Sig  . alendronate (FOSAMAX) 70 MG tablet TAKE 1 TABLET BY MOUTH ONCE A WEEK *TAKE  WITH  A  FULL  GLASS  OF  WATER  ON  AN  EMPTY  STOMACH*  . Alpha-Lipoic Acid 100 MG CAPS Take 1 capsule by mouth daily.   Marland Kitchen aspirin EC 81 MG tablet Take 81 mg by mouth daily.  . B Complex-C (SUPER B COMPLEX PO) Take 1 tablet by  mouth daily.  . Biotin 1000 MCG tablet Take 1,000 mcg by mouth 2 (two) times daily.   . calcium citrate-vitamin D (CITRACAL+D) 315-200 MG-UNIT per tablet Take 1 tablet by mouth 2 (two) times daily.  . Cholecalciferol (VITAMIN D3) 5000 units CAPS Take 1 capsule by mouth daily.  Marland Kitchen glucosamine-chondroitin 500-400 MG tablet Take 1 tablet by mouth 2 (two) times daily.  Marland Kitchen losartan-hydrochlorothiazide (HYZAAR) 50-12.5 MG tablet Take 1 tablet by mouth daily.  Marland Kitchen lovastatin (MEVACOR) 10 MG tablet Take 1 tablet (10 mg total) by mouth at bedtime.  . Multiple Vitamin (MULTI-VITAMINS) TABS Take 1 tablet by mouth daily.   . [DISCONTINUED] doxycycline (VIBRA-TABS) 100 MG tablet TAKE 1 TABLET BY MOUTH TWICE DAILY  . [DISCONTINUED] ondansetron (ZOFRAN) 4 MG tablet Take 1 tablet (4 mg total) by mouth every 8 (eight) hours as needed for nausea or vomiting.  . [DISCONTINUED] oxyCODONE-acetaminophen (PERCOCET/ROXICET) 5-325 MG tablet Take 1 tablet by mouth every 4 (four) hours as needed for severe pain.   Facility-Administered Encounter Medications as of 03/14/2018  Medication  . sodium chloride flush (NS) 0.9 % injection 10 mL  . sodium chloride flush (NS) 0.9 % injection 10 mL    Activities of Daily Living In your present state of health, do you have any difficulty performing the following activities: 03/14/2018  Hearing? N  Vision? N  Difficulty concentrating or making decisions? N  Walking or climbing stairs? N  Dressing or bathing? N  Doing errands, shopping? N  Preparing Food and eating ? N  Using the Toilet? N  In the past six months, have you accidently leaked urine? Y  Comment Managed with a daily pad  Do you have problems with loss of bowel control? N  Comment Colostomy  Managing your Medications? N  Managing your Finances? N  Housekeeping or managing your Housekeeping? N  Some recent data might be hidden    Patient Care Team: Einar Pheasant, MD as PCP - General (Internal Medicine) Regal,  Tamala Fothergill, DPM as Consulting Physician (Podiatry) Lloyd Huger, MD as Consulting Physician (Oncology)    Assessment:   This is a routine wellness examination for [place].  Fasting labs as directed by pcp.  Health Screenings  Mammogram -12/11/17 Colonoscopy 03/21/15 Bone Density -10/04/15 Glaucoma -none Hearing -demonstrates normal hearing during conversation Hemoglobin A1C -10/31/17 (5.8) Cholesterol -10/31/17 (105) Dental- every 6 months Vision-annual  Social  Alcohol intake -no Smoking history- -none Smokers in home? none Illicit drug use? none  Exercise -walking  Diet -low cholesterol Sexually Active -never   Safety  Patient feels safe at home.  Patient does have smoke detectors at home  Patient does wear sunscreen or protective clothing when in direct sunlight. Patient does wear seat belt when driving or riding with others.   Activities of Daily Living Patient can do their own household chores. Denies needing assistance with: driving, feeding themselves, getting from bed to chair, getting to the toilet, bathing/showering, dressing, managing money, climbing flight of stairs, or preparing meals.   Colostomy in place and reports having no issues.   Depression Screen Patient denies losing interest in daily life, feeling hopeless, or crying easily over simple problems.   Fall Screen Patient denies being afraid of falling or falling in the last year.   Memory Screen Patient denies problems with memory, misplacing items, and is able to balance checkbook/bank accounts.  Patient is alert, normal appearance, oriented to person/place/and time. Correctly identified the president of the [place], recall of 2/3 objects, and performing simple calculations.  Patient displays appropriate judgement and can read correct time from watch face.   Immunizations The following Immunizations are up to date: Influenza, pneumonia. Shingles and tetanus discussed.   Other Providers Patient  Care Team: Einar Pheasant, MD as PCP - General (Internal Medicine) Regal, Tamala Fothergill, DPM as Consulting Physician (Podiatry) Lloyd Huger, MD as Consulting Physician (Oncology)  Exercise Activities and Dietary recommendations Current Exercise Habits: Home exercise routine, Type of exercise: walking, Time (Minutes): 30, Frequency (Times/Week): 1, Weekly Exercise (Minutes/Week): 30, Intensity: Mild  Goals      Patient Stated   . Increase physical activity (pt-stated)     Walk for exercise 4-5 days, 30 minutes       Fall Risk Fall Risk  03/14/2018 03/08/2017 03/07/2016 10/18/2015 06/09/2014  Falls in the past year? 0 No Yes Yes No  Number falls in past yr: - - 1 1 -  Injury with Fall? - - No No -  Follow up - - Falls prevention discussed - -  Depression Screen PHQ 2/9 Scores 03/14/2018 03/08/2017 03/07/2016 10/18/2015  PHQ - 2 Score 0 0 0 0     Cognitive Function MMSE - Mini Mental State Exam 03/08/2017  Orientation to time 5  Orientation to Place 5  Registration 3  Attention/ Calculation 5  Recall 1  Language- name 2 objects 2  Language- repeat 1  Language- follow 3 step command 3  Language- read & follow direction 1  Write a sentence 1  Copy design 1  Total score 28     6CIT Screen 03/14/2018 03/07/2016  What Year? 0 points 0 points  What month? 0 points 0 points  What time? 0 points 0 points  Count back from 20 0 points 0 points  Months in reverse 0 points 0 points  Repeat phrase 0 points 0 points  Total Score 0 0    Immunization History  Administered Date(s) Administered  . Influenza, High Dose Seasonal PF 10/18/2015, 10/22/2016, 11/01/2017  . Influenza,inj,Quad PF,6+ Mos 10/12/2014  . Influenza-Unspecified 11/30/2013  . Pneumococcal Conjugate-13 10/22/2016  . Pneumococcal Polysaccharide-23 01/30/2004   Screening Tests Health Maintenance  Topic Date Due  . TETANUS/TDAP  09/18/1956  . FOOT EXAM  06/05/2017  . OPHTHALMOLOGY EXAM  12/11/2017  . HEMOGLOBIN A1C   05/02/2018  . MAMMOGRAM  12/12/2018  . COLONOSCOPY  03/20/2020  . INFLUENZA VACCINE  Completed  . DEXA SCAN  Completed  . PNA vac Low Risk Adult  Completed      Plan:    End of life planning; Advanced aging; Advanced directives discussed.  No HCPOA/Living Will.  Additional information declined at this time.  I have personally reviewed and noted the following in the patient's chart:   . Medical and social history . Use of alcohol, tobacco or illicit drugs  . Current medications and supplements . Functional ability and status . Nutritional status . Physical activity . Advanced directives . List of other physicians . Hospitalizations, surgeries, and ER visits in previous 12 months . Vitals . Screenings to include cognitive, depression, and falls . Referrals and appointments  In addition, I have reviewed and discussed with patient certain preventive protocols, quality metrics, and best practice recommendations. A written personalized care plan for preventive services as well as general preventive health recommendations were provided to patient.     Varney Biles, LPN  8/56/3149   Reviewed above information.  Agree with assessment and plan.    Dr Nicki Reaper

## 2018-03-16 LAB — FOLATE RBC: RBC Folate: 975 ng/mL RBC (ref >280)

## 2018-03-17 ENCOUNTER — Encounter: Payer: Medicare Other | Admitting: Internal Medicine

## 2018-04-14 ENCOUNTER — Other Ambulatory Visit: Payer: Self-pay | Admitting: Internal Medicine

## 2018-04-14 DIAGNOSIS — I10 Essential (primary) hypertension: Secondary | ICD-10-CM

## 2018-04-14 MED ORDER — LOSARTAN POTASSIUM-HCTZ 50-12.5 MG PO TABS: 1.0000 | ORAL_TABLET | Freq: Every day | ORAL | 1 refills | Status: DC

## 2018-04-14 NOTE — Telephone Encounter (Signed)
Copied from [place] [id number]. Topic: Quick Communication - Rx Refill/Question >> Apr 14, 2018  9:41 AM Blase Mess A wrote: Medication: losartan-hydrochlorothiazide (HYZAAR) 50-12.5 MG tablet [[id number]]  Has the patient contacted their pharmacy? Yes  (Agent: If no, request that the patient contact the pharmacy for the refill.) (Agent: If yes, when and what did the pharmacy advise?)  Preferred Pharmacy (with phone number or street name): [facility] [place], [place] [phone] (Phone) [phone] (Fax)    Agent: Please be advised that RX refills may take up to 3 business days. We ask that you follow-up with your pharmacy.

## 2018-04-18 DIAGNOSIS — Z85038 Personal history of other malignant neoplasm of large intestine: Secondary | ICD-10-CM | POA: Diagnosis not present

## 2018-04-18 DIAGNOSIS — Z933 Colostomy status: Secondary | ICD-10-CM | POA: Diagnosis not present

## 2018-04-23 ENCOUNTER — Encounter: Payer: Self-pay | Admitting: Internal Medicine

## 2018-04-23 ENCOUNTER — Other Ambulatory Visit: Payer: Self-pay

## 2018-04-23 ENCOUNTER — Ambulatory Visit: Payer: Medicare Other | Admitting: Internal Medicine

## 2018-04-23 VITALS — BP 128/70 | HR 57 | Temp 97.9°F | Resp 16 | Wt 147.8 lb

## 2018-04-23 DIAGNOSIS — G629 Polyneuropathy, unspecified: Secondary | ICD-10-CM

## 2018-04-23 DIAGNOSIS — I771 Stricture of artery: Secondary | ICD-10-CM

## 2018-04-23 DIAGNOSIS — Z85048 Personal history of other malignant neoplasm of rectum, rectosigmoid junction, and anus: Secondary | ICD-10-CM | POA: Diagnosis not present

## 2018-04-23 DIAGNOSIS — I6523 Occlusion and stenosis of bilateral carotid arteries: Secondary | ICD-10-CM

## 2018-04-23 DIAGNOSIS — Z933 Colostomy status: Secondary | ICD-10-CM | POA: Diagnosis not present

## 2018-04-23 DIAGNOSIS — Z0000 Encounter for general adult medical examination without abnormal findings: Secondary | ICD-10-CM

## 2018-04-23 DIAGNOSIS — E2839 Other primary ovarian failure: Secondary | ICD-10-CM

## 2018-04-23 DIAGNOSIS — N9089 Other specified noninflammatory disorders of vulva and perineum: Secondary | ICD-10-CM

## 2018-04-23 DIAGNOSIS — R739 Hyperglycemia, unspecified: Secondary | ICD-10-CM

## 2018-04-23 DIAGNOSIS — E7800 Pure hypercholesterolemia, unspecified: Secondary | ICD-10-CM

## 2018-04-23 DIAGNOSIS — M8580 Other specified disorders of bone density and structure, unspecified site: Secondary | ICD-10-CM

## 2018-04-23 NOTE — Assessment & Plan Note (Signed)
Stable

## 2018-04-23 NOTE — Assessment & Plan Note (Signed)
On lovastatin.  Low cholesterol diet and exercise.  Follow lipid panel and liver function tests.   

## 2018-04-23 NOTE — Assessment & Plan Note (Signed)
Low carb diet and exercise.  Follow met b and a1c.  

## 2018-04-23 NOTE — Assessment & Plan Note (Signed)
Has seen vascular surgery previously.  Saw Dr Adele Barthel 07/2016 - RCA < 40% and LCA approximately 50%.  Overdue f/u.  States received a letter that Dr Bridgett Larsson no longer at [facility].  Discussed f/u local vascular surgery.

## 2018-04-23 NOTE — Assessment & Plan Note (Signed)
Plan f/u with vascular surgery.

## 2018-04-23 NOTE — Progress Notes (Signed)
[name], female   DOB: 09-May-1935, 83 y.o.   MRN: [id number]   Subjective:    [name], female    DOB: [birth year], 83 y.o.   MRN: [id number]  HPI  Patient here for her physical exam.   She reports she is doing relatively well.  Is s/p McBride bunionectomy and amputation of left second toe.  Seeing podiatry. Was given spacers. Still has some concerns about her great toe.  Plans to discuss with podiatry.   Also sees oncology for f/u adenocarcinoma of the rectum.  Has a colostomy.  States working well.  No abdominal pain.  Appetite good.  No chest pain or sob.  No acid reflux.  Does have a vaginal lesion - persistent.  No pain.  Last evaluated by oncology 07/2017.  Stable.  Recommended f/u in one year.  Also has been followed by Dr Bridgett Larsson - carotids.  States received letter he was no longer with [facility].  Discussed f/u with local vascular surgeon.  She is in agreement.  On fosamax.  Discussed f/u bone density.     Past Medical History:  Diagnosis Date  . Colon cancer ([place])   . History of shingles    twice  . Hypercholesteremia   . Hypertension   . Left carotid bruit   . Nephrolithiasis   . Neuropathy   . Personal history of chemotherapy 2014   Colon  . Personal history of radiation therapy 2014   Colon  . Urine incontinence 03/06/2013   h/o   Past Surgical History:  Procedure Laterality Date  . APPENDECTOMY  1979   colorectal  . CESAREAN SECTION    . COLONOSCOPY WITH PROPOFOL N/A 03/21/2015   Procedure: COLONOSCOPY WITH PROPOFOL;  Surgeon: Hulen Luster, MD;  Location: [facility] ENDOSCOPY;  Service: Gastroenterology;  Laterality: N/A;  . COLOSTOMY    . surgery for colorectal cancer    . VENTRAL HERNIA REPAIR Right [phone]   Family History  Problem Relation Age of Onset  . Hypertension Mother   . Hypertension Father   . Breast cancer Paternal Aunt    Social History   Socioeconomic History  . Marital status: Widowed    Spouse name: Not on file  . Number  of children: Not on file  . Years of education: Not on file  . Highest education level: Not on file  Occupational History  . Not on file  Social Needs  . Financial resource strain: Not hard at all  . Food insecurity:    Worry: Never true    Inability: Never true  . Transportation needs:    Medical: No    Non-medical: No  Tobacco Use  . Smoking status: Never Smoker  . Smokeless tobacco: Never Used  Substance and Sexual Activity  . Alcohol use: No    Alcohol/week: 0.0 standard drinks  . Drug use: No  . Sexual activity: Never  Lifestyle  . Physical activity:    Days per week: 4 days    Minutes per session: 30 min  . Stress: Not at all  Relationships  . Social connections:    Talks on phone: Not on file    Gets together: Not on file    Attends religious service: Not on file    Active member of club or organization: Not on file    Attends meetings of clubs or organizations: Not on file    Relationship status: Not on file  Other Topics Concern  . Not on  file  Social History Narrative  . Not on file    Outpatient Encounter Medications as of 04/23/2018  Medication Sig  . alendronate (FOSAMAX) 70 MG tablet TAKE 1 TABLET BY MOUTH ONCE A WEEK *TAKE  WITH  A  FULL  GLASS  OF  WATER  ON  AN  EMPTY  STOMACH*  . Alpha-Lipoic Acid 100 MG CAPS Take 1 capsule by mouth daily.   Marland Kitchen aspirin EC 81 MG tablet Take 81 mg by mouth daily.  . B Complex-C (SUPER B COMPLEX PO) Take 1 tablet by mouth daily.  . Biotin 1000 MCG tablet Take 1,000 mcg by mouth 2 (two) times daily.   . calcium citrate-vitamin D (CITRACAL+D) 315-200 MG-UNIT per tablet Take 1 tablet by mouth 2 (two) times daily.  . Cholecalciferol (VITAMIN D3) 5000 units CAPS Take 1 capsule by mouth daily.  Marland Kitchen glucosamine-chondroitin 500-400 MG tablet Take 1 tablet by mouth 2 (two) times daily.  Marland Kitchen losartan-hydrochlorothiazide (HYZAAR) 50-12.5 MG tablet Take 1 tablet by mouth daily.  Marland Kitchen lovastatin (MEVACOR) 10 MG tablet Take 1 tablet (10 mg  total) by mouth at bedtime.  . Multiple Vitamin (MULTI-VITAMINS) TABS Take 1 tablet by mouth daily.    Facility-Administered Encounter Medications as of 04/23/2018  Medication  . sodium chloride flush (NS) 0.9 % injection 10 mL  . sodium chloride flush (NS) 0.9 % injection 10 mL    Review of Systems  Constitutional: Negative for appetite change and unexpected weight change.  HENT: Negative for congestion and sinus pressure.   Eyes: Negative for pain and visual disturbance.  Respiratory: Negative for cough, chest tightness and shortness of breath.   Cardiovascular: Negative for chest pain, palpitations and leg swelling.  Gastrointestinal: Negative for abdominal pain, nausea and vomiting.  Genitourinary: Negative for difficulty urinating and dysuria.  Musculoskeletal: Negative for joint swelling and myalgias.  Skin: Negative for color change and rash.  Neurological: Negative for dizziness, light-headedness and headaches.  Hematological: Negative for adenopathy. Does not bruise/bleed easily.  Psychiatric/Behavioral: Negative for agitation and dysphoric mood.       Objective:    Physical Exam Constitutional:      General: She is not in acute distress.    Appearance: Normal appearance. She is well-developed.  HENT:     Nose: Nose normal. No congestion.     Mouth/Throat:     Pharynx: No oropharyngeal exudate or posterior oropharyngeal erythema.  Eyes:     General: No scleral icterus.       Right eye: No discharge.        Left eye: No discharge.  Neck:     Musculoskeletal: Neck supple. No muscular tenderness.     Thyroid: No thyromegaly.  Cardiovascular:     Rate and Rhythm: Normal rate and regular rhythm.  Pulmonary:     Effort: No tachypnea, accessory muscle usage or respiratory distress.     Breath sounds: Normal breath sounds. No decreased breath sounds or wheezing.  Chest:     Breasts:        Right: No inverted nipple, mass, nipple discharge or tenderness (no axillary  adenopathy).        Left: No inverted nipple, mass, nipple discharge or tenderness (no axilarry adenopathy).  Abdominal:     General: Bowel sounds are normal.     Palpations: Abdomen is soft.     Tenderness: There is no abdominal tenderness.     Comments: No pain around ostomy.    Genitourinary:    Comments:  Small dark circular labial lesion.  Non tender.   Musculoskeletal:        General: No swelling or tenderness.  Lymphadenopathy:     Cervical: No cervical adenopathy.  Skin:    Findings: No erythema or rash.  Neurological:     Mental Status: She is alert and oriented to person, place, and time.  Psychiatric:        Mood and Affect: Mood normal.        Behavior: Behavior normal.     BP 128/70   Pulse (!) 57   Temp 97.9 F (36.6 C) (Oral)   Resp 16   Wt 147 lb 12.8 oz (67 kg)   SpO2 98%   BMI 26.60 kg/m  Wt Readings from Last 3 Encounters:  04/23/18 147 lb 12.8 oz (67 kg)  03/14/18 148 lb 1.9 oz (67.2 kg)  11/01/17 153 lb (69.4 kg)     Lab Results  Component Value Date   WBC 7.0 10/31/2017   HGB 12.6 10/31/2017   HCT 36.7 10/31/2017   PLT 286.0 10/31/2017   GLUCOSE 116 (H) 03/14/2018   CHOL 114 03/14/2018   TRIG 81.0 03/14/2018   HDL 35.80 (L) 03/14/2018   LDLCALC 62 03/14/2018   ALT 21 03/14/2018   AST 28 03/14/2018   NA 142 03/14/2018   K 4.5 03/14/2018   CL 104 03/14/2018   CREATININE 0.95 03/14/2018   BUN 24 (H) 03/14/2018   CO2 30 03/14/2018   TSH 2.36 06/26/2017   INR 1.1 10/21/2012   HGBA1C 5.7 03/14/2018   MICROALBUR 1.1 10/19/2016    Mm 3d Screen Breast Bilateral  Result Date: 12/11/2017 CLINICAL DATA:  Screening. EXAM: DIGITAL SCREENING BILATERAL MAMMOGRAM WITH TOMO AND CAD COMPARISON:  Previous exam(s). ACR Breast Density Category c: The breast tissue is heterogeneously dense, which may obscure small masses. FINDINGS: There are no findings suspicious for malignancy. Images were processed with CAD. IMPRESSION: No mammographic evidence of  malignancy. A result letter of this screening mammogram will be mailed directly to the patient. RECOMMENDATION: Screening mammogram in one year. (Code:SM-B-01Y) BI-RADS CATEGORY  1: Negative. Electronically Signed   By: Lillia Mountain M.D.   On: 12/11/2017 13:01       Assessment & Plan:   Problem List Items Addressed This Visit    Carotid artery disease ([place])    Has seen vascular surgery previously.  Saw Dr Adele Barthel 07/2016 - RCA < 40% and LCA approximately 50%.  Overdue f/u.  States received a letter that Dr Bridgett Larsson no longer at [facility].  Discussed f/u local vascular surgery.        Relevant Orders   Ambulatory referral to Vascular Surgery   Colostomy status ([place])    Ostomy working well.        Health care maintenance    Physical today 04/23/18.  Mammogram 12/11/17 - Birads I.  Colonosopy 2017 - due f/u 2022.        History of rectal cancer    History of rectal cancer.  S/p chemo and XRT and colectomy.  Last colonoscopy 03/2015. Recommended f/u in 5 years.  Followed by Dr Grayland Ormond.  Stable.  Last check 07/2017.  Recommended f/u in one year.        Hypercholesterolemia    On lovastatin.  Low cholesterol diet and exercise.  Follow lipid panel and liver function tests.        Relevant Orders   TSH   Hepatic function panel   Lipid panel  Hyperglycemia    Low carb diet and exercise.  Follow met b and a1c.       Relevant Orders   Hemoglobin Z2C   Basic metabolic panel   Labial lesion    Persistent. Have gyn evaluate to confirm no further w/up or evaluation warranted.        Relevant Orders   Ambulatory referral to Gynecology   Neuropathy    Stable.        Osteopenia    On fosamax.  Schedule f/u bone density.       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         Other Visit Diagnoses    Routine general medical examination at a health care facility    -  Primary   Estrogen deficiency       Relevant Orders   DG Bone Density        Einar Pheasant, MD

## 2018-04-23 NOTE — Assessment & Plan Note (Signed)
On fosamax.  Schedule f/u bone density.

## 2018-04-23 NOTE — Assessment & Plan Note (Signed)
Ostomy working well.  

## 2018-04-23 NOTE — Assessment & Plan Note (Signed)
History of rectal cancer.  S/p chemo and XRT and colectomy.  Last colonoscopy 03/2015. Recommended f/u in 5 years.  Followed by Dr Grayland Ormond.  Stable.  Last check 07/2017.  Recommended f/u in one year.

## 2018-04-23 NOTE — Assessment & Plan Note (Signed)
Persistent. Have gyn evaluate to confirm no further w/up or evaluation warranted.

## 2018-04-23 NOTE — Assessment & Plan Note (Signed)
Physical today 04/23/18.  Mammogram 12/11/17 - Birads I.  Colonosopy 2017 - due f/u 2022.

## 2018-04-25 ENCOUNTER — Encounter: Payer: Self-pay | Admitting: Internal Medicine

## 2018-04-29 ENCOUNTER — Telehealth: Payer: Self-pay

## 2018-04-29 MED ORDER — LOSARTAN POTASSIUM 50 MG PO TABS: 50.0000 mg | ORAL_TABLET | Freq: Every day | ORAL | 1 refills | Status: DC

## 2018-04-29 MED ORDER — HYDROCHLOROTHIAZIDE 12.5 MG PO CAPS: 12.5000 mg | ORAL_CAPSULE | Freq: Every day | ORAL | 1 refills | Status: DC

## 2018-04-29 NOTE — Telephone Encounter (Signed)
A fax came in from the pharmacy: Please send new RX's for the separate ingredients- losartan & hctz- the combination tablet is on backorder.  Send to [facility] .   Yareli Carthen,cma

## 2018-04-29 NOTE — Telephone Encounter (Signed)
I sent in rx for losartan and hctz - separately.  Please notify pt.  Was sent to pharmacy marked - [facility].  Confirm correct pharmacy

## 2018-05-05 ENCOUNTER — Ambulatory Visit (INDEPENDENT_AMBULATORY_CARE_PROVIDER_SITE_OTHER): Payer: Medicare Other | Admitting: Obstetrics and Gynecology

## 2018-05-05 ENCOUNTER — Encounter: Payer: Self-pay | Admitting: Obstetrics and Gynecology

## 2018-05-05 ENCOUNTER — Other Ambulatory Visit: Payer: Self-pay

## 2018-05-05 VITALS — BP 146/80 | HR 63 | Ht 62.5 in | Wt 149.0 lb

## 2018-05-05 DIAGNOSIS — N952 Postmenopausal atrophic vaginitis: Secondary | ICD-10-CM

## 2018-05-05 IMAGING — MG DIGITAL SCREENING BILATERAL MAMMOGRAM WITH CAD
5 series · 5 of 5 positions shown · non-contrast
Comparison: Previous exam(s).

CLINICAL DATA: Screening.

EXAM:
DIGITAL SCREENING BILATERAL MAMMOGRAM WITH CAD

[L CC]
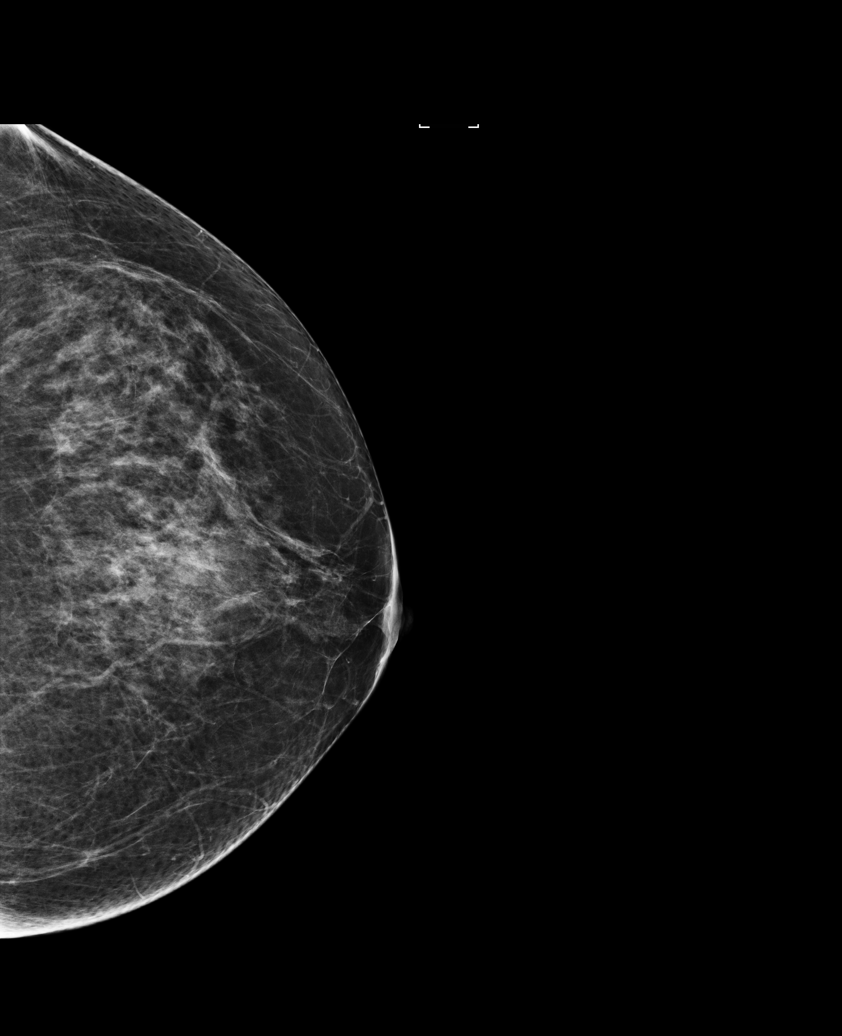

[L MLO (1 of 2)]
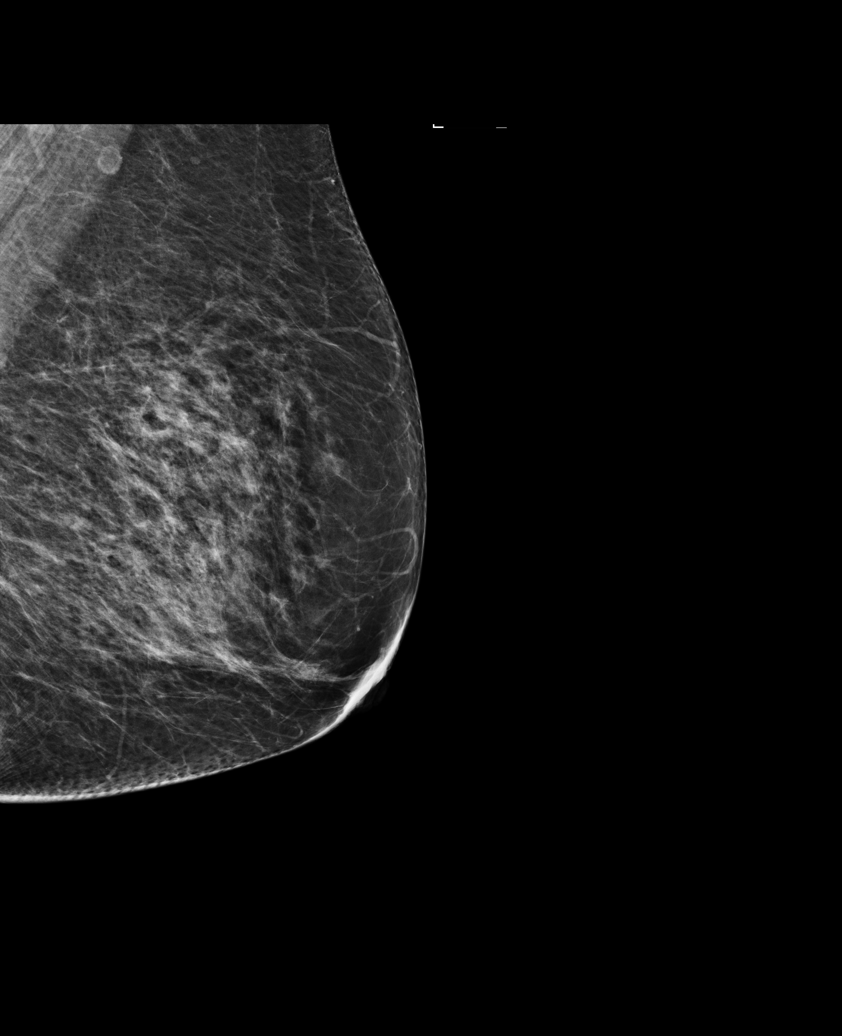

[R CC]
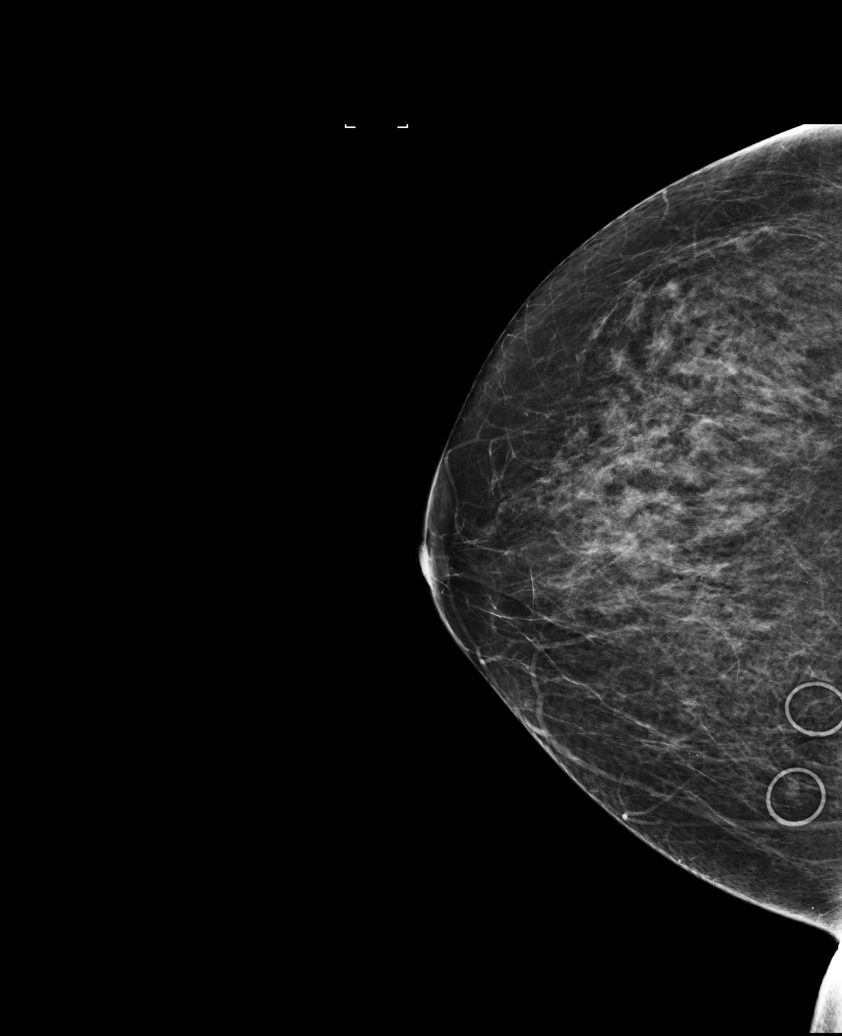

[R MLO]
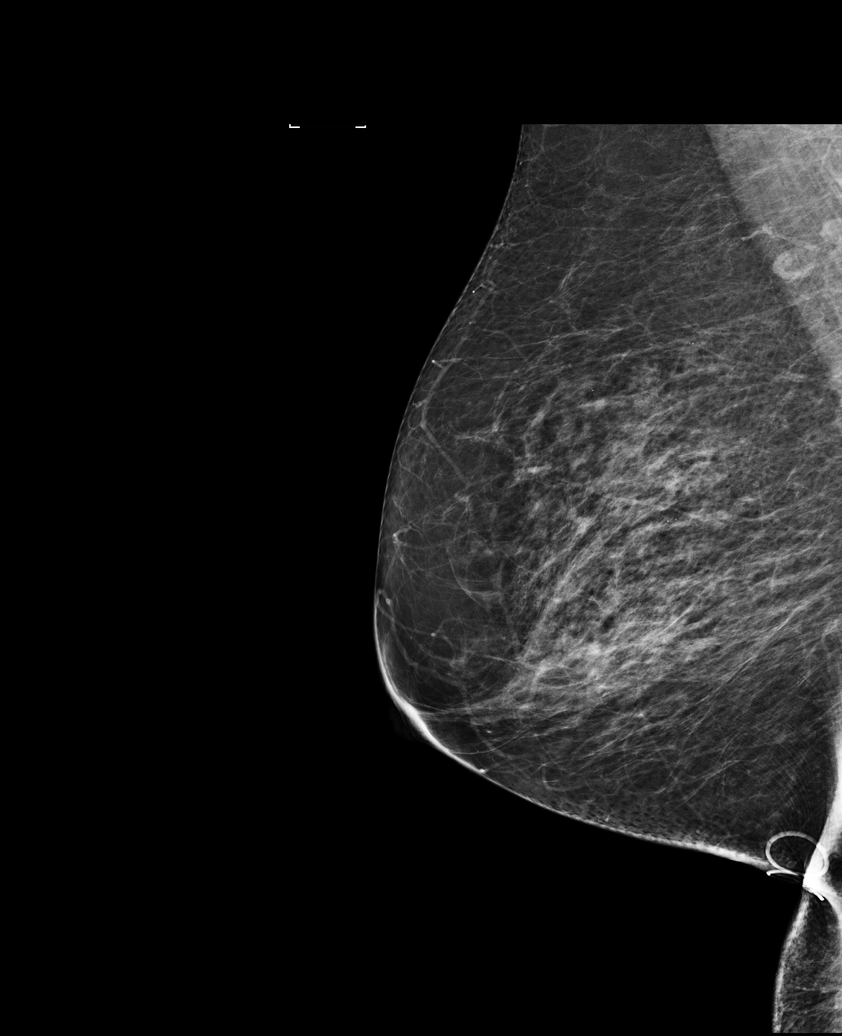

[L MLO (2 of 2)]
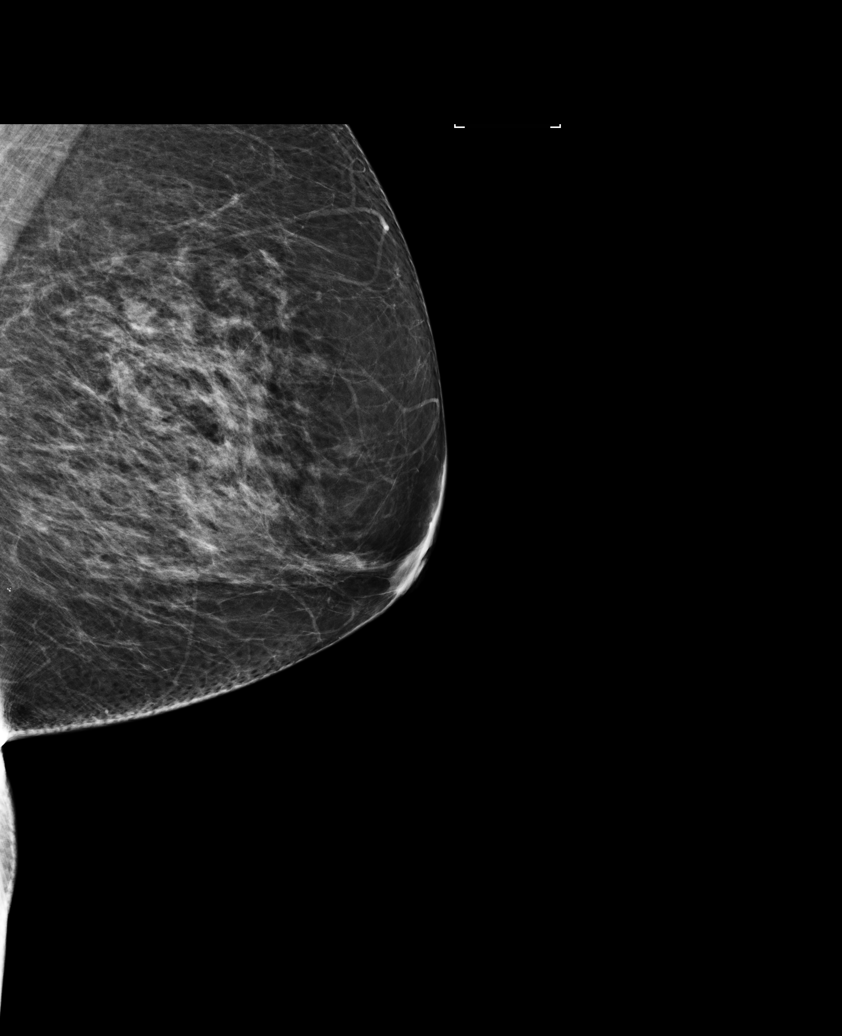

[5 of 5 positions shown; findings below may reference images not displayed]

ACR Breast Density Category b: There are scattered areas of
fibroglandular density.
FINDINGS: There are no findings suspicious for malignancy. Images were
processed with CAD.
IMPRESSION: No mammographic evidence of malignancy. A result letter of this
screening mammogram will be mailed directly to the patient.

RECOMMENDATION:
Screening mammogram in one year. (Code:AS-G-LCT)

BI-RADS CATEGORY  1: Negative.

## 2018-05-05 MED ORDER — CLOBETASOL PROPIONATE 0.05 % EX OINT: TOPICAL_OINTMENT | CUTANEOUS | 3 refills | Status: DC

## 2018-05-05 NOTE — Progress Notes (Signed)
[name], female   DOB: 04/20/1935, 83 y.o.   MRN: [id number]  Reason for Consult: vaginal skin tag (black spot inside labia/no pain/no oozing/hx of colon cancer)   Referred by Einar Pheasant, MD  Subjective:     HPI:  Christina Mathis is a 83 y.o. female . About 1 months ago she noticed a small black spot like a pencil head on her labia. She checks her bottom each night with a mirror because of her history of colon cancer.  She does wear pads daily because of urgency incontinence. She declines treatment with medication for incontinence symptoms. She denies vaginal bleeding, denies vaginal discharge. Denies vulvar itching or pain.   Past Medical History:  Diagnosis Date  . Colon cancer ([place])   . History of shingles    twice  . Hypercholesteremia   . Hypertension   . Left carotid bruit   . Nephrolithiasis   . Neuropathy   . Personal history of chemotherapy 2014   Colon  . Personal history of radiation therapy 2014   Colon  . Urine incontinence 03/06/2013   h/o   Family History  Problem Relation Age of Onset  . Hypertension Mother   . Hypertension Father   . Breast cancer Paternal Aunt    Past Surgical History:  Procedure Laterality Date  . APPENDECTOMY  1979   colorectal  . CESAREAN SECTION    . COLONOSCOPY WITH PROPOFOL N/A 03/21/2015   Procedure: COLONOSCOPY WITH PROPOFOL;  Surgeon: Hulen Luster, MD;  [facility]: [facility] ENDOSCOPY;  Service: Gastroenterology;  Laterality: N/A;  . COLOSTOMY    . surgery for colorectal cancer    . VENTRAL HERNIA REPAIR Right [id number]    Short Social History:  Social History   Tobacco Use  . Smoking status: Never Smoker  . Smokeless tobacco: Never Used  Substance Use Topics  . Alcohol use: No    Alcohol/week: 0.0 standard drinks    No Known Allergies  Current Outpatient Medications  Medication Sig Dispense Refill  . alendronate (FOSAMAX) 70 MG tablet TAKE 1 TABLET BY MOUTH ONCE A WEEK *TAKE  WITH  A  FULL  GLASS   OF  WATER  ON  AN  EMPTY  STOMACH* 4 tablet 11  . Alpha-Lipoic Acid 100 MG CAPS Take 1 capsule by mouth daily.     Marland Kitchen aspirin EC 81 MG tablet Take 81 mg by mouth daily.    . B Complex-C (SUPER B COMPLEX PO) Take 1 tablet by mouth daily.    . Biotin 1000 MCG tablet Take 1,000 mcg by mouth 2 (two) times daily.     . calcium citrate-vitamin D (CITRACAL+D) 315-200 MG-UNIT per tablet Take 1 tablet by mouth 2 (two) times daily.    . Cholecalciferol (VITAMIN D3) 5000 units CAPS Take 1 capsule by mouth daily.    Marland Kitchen glucosamine-chondroitin 500-400 MG tablet Take 1 tablet by mouth 2 (two) times daily.    . hydrochlorothiazide (MICROZIDE) 12.5 MG capsule Take 1 capsule (12.5 mg total) by mouth daily. 90 capsule 1  . losartan (COZAAR) 50 MG tablet Take 1 tablet (50 mg total) by mouth daily. 90 tablet 1  . lovastatin (MEVACOR) 10 MG tablet Take 1 tablet (10 mg total) by mouth at bedtime. 90 tablet 1  . Multiple Vitamin (MULTI-VITAMINS) TABS Take 1 tablet by mouth daily.     . clobetasol ointment (TEMOVATE) 0.05 % Apply to affected area every night for 12 weeks 30 g 3  No current facility-administered medications for this visit.    Facility-Administered Medications Ordered in Other Visits  Medication Dose Route Frequency Provider Last Rate Last Dose  . sodium chloride flush (NS) 0.9 % injection 10 mL  10 mL Intravenous PRN Forest Gleason, MD   10 mL at 05/31/15 1040  . sodium chloride flush (NS) 0.9 % injection 10 mL  10 mL Intravenous PRN Choksi, Delorise Shiner, MD   10 mL at 07/12/15 1018    REVIEW OF SYSTEMS      Objective:  Objective   Vitals:   05/05/18 1010  BP: (!) 146/80  Pulse: 63  Weight: 149 lb (67.6 kg)  Height: 5' 2.5" (1.588 m)   Body mass index is 26.82 kg/m.  Physical Exam Vitals signs and nursing note reviewed.  Constitutional:      Appearance: She is well-developed.  HENT:     Head: Normocephalic and atraumatic.  Eyes:     Pupils: Pupils are equal, round, and reactive to light.   Cardiovascular:     Rate and Rhythm: Normal rate and regular rhythm.  Pulmonary:     Effort: Pulmonary effort is normal. No respiratory distress.  Genitourinary:      Comments: Vaginal narrowing and atrophy.  Skin:    General: Skin is warm and dry.  Neurological:     Mental Status: She is alert and oriented to person, place, and time.  Psychiatric:        Behavior: Behavior normal.        Thought Content: Thought content normal.        Judgment: Judgment normal.     Assessment/Plan:     83 yo with a labial lesion   Small blood blister on left labia. Will have patient apply nightly steroid ointment for 12 weeks. No concern for malignancy at this time. Will follow up in 12 weeks if needed. Patient closely monitors bottom with a mirror. She will call if she noticed any changes or growth of the spot.  More than 20 minutes were spent face to face with the patient in the room with more than 50% of the time spent providing counseling and discussing the plan of management.    Adrian Prows MD [facility], [facility] 05/05/2018 10:44 AM

## 2018-06-17 ENCOUNTER — Encounter (INDEPENDENT_AMBULATORY_CARE_PROVIDER_SITE_OTHER): Payer: Medicare Other | Admitting: Vascular Surgery

## 2018-07-04 DIAGNOSIS — L565 Disseminated superficial actinic porokeratosis (DSAP): Secondary | ICD-10-CM | POA: Diagnosis not present

## 2018-07-04 DIAGNOSIS — L309 Dermatitis, unspecified: Secondary | ICD-10-CM | POA: Diagnosis not present

## 2018-07-10 ENCOUNTER — Other Ambulatory Visit: Payer: Self-pay

## 2018-07-10 ENCOUNTER — Ambulatory Visit (INDEPENDENT_AMBULATORY_CARE_PROVIDER_SITE_OTHER): Payer: Medicare Other | Admitting: Vascular Surgery

## 2018-07-10 ENCOUNTER — Encounter (INDEPENDENT_AMBULATORY_CARE_PROVIDER_SITE_OTHER): Payer: Self-pay | Admitting: Vascular Surgery

## 2018-07-10 VITALS — BP 174/68 | HR 56 | Resp 16 | Ht 63.0 in | Wt 151.4 lb

## 2018-07-10 DIAGNOSIS — I6523 Occlusion and stenosis of bilateral carotid arteries: Secondary | ICD-10-CM | POA: Diagnosis not present

## 2018-07-10 DIAGNOSIS — E7800 Pure hypercholesterolemia, unspecified: Secondary | ICD-10-CM

## 2018-07-10 DIAGNOSIS — I890 Lymphedema, not elsewhere classified: Secondary | ICD-10-CM

## 2018-07-10 DIAGNOSIS — Z7902 Long term (current) use of antithrombotics/antiplatelets: Secondary | ICD-10-CM

## 2018-07-10 DIAGNOSIS — K219 Gastro-esophageal reflux disease without esophagitis: Secondary | ICD-10-CM

## 2018-07-10 DIAGNOSIS — Z7982 Long term (current) use of aspirin: Secondary | ICD-10-CM

## 2018-07-10 DIAGNOSIS — Z79899 Other long term (current) drug therapy: Secondary | ICD-10-CM

## 2018-07-10 NOTE — Progress Notes (Signed)
MRN : [id number]  Christina Mathis is a 83 y.o. (1935-08-02) female who presents with chief complaint of  Chief Complaint  Patient presents with  . New Patient (Initial Visit)    ref Nicki Reaper for carotid stenosis  .  History of Present Illness:   The patient is seen for follow up evaluation of carotid stenosis. The carotid stenosis has been followed by Dr Bridgett Larsson with ultrasound.   The patient denies amaurosis fugax. There is no recent history of TIA symptoms or focal motor deficits. There is no prior documented CVA.  The patient is taking enteric-coated aspirin 81 mg daily.  There is no history of migraine headaches. There is no history of seizures.  Patient is also seen for evaluation of leg swelling. The patient first noticed the swelling remotely. The swelling is associated with mild pain and discoloration. The patient notes that in the morning the legs are significantly improved but they steadily worsened throughout the course of the day. Elevation makes the legs better, dependency makes them much worse.   There is no history of ulcerations associated with the swelling.   The patient denies any recent changes in their medications.  The patient has been wearing graduated compression on a daily basis.  The patient has no had any past angiography, interventions or vascular surgery.  The patient denies a history of DVT or PE. There is no prior history of phlebitis. There is no history of primary lymphedema.  There is a history of radiation treatment to the groin and pelvis secondary to colon cancer.  Her colectomy was also performed with a lymph node dissection.  No history of foreign travel or parasitic infections area   The patient has a history of coronary artery disease, no recent episodes of angina or shortness of breath. The patient denies PAD or claudication symptoms. There is a history of hyperlipidemia which is being treated with a statin.    Previous carotid duplex done  today shows <40-49%.    Current Meds  Medication Sig  . alendronate (FOSAMAX) 70 MG tablet TAKE 1 TABLET BY MOUTH ONCE A WEEK *TAKE  WITH  A  FULL  GLASS  OF  WATER  ON  AN  EMPTY  STOMACH*  . Alpha-Lipoic Acid 100 MG CAPS Take 1 capsule by mouth daily.   Marland Kitchen aspirin EC 81 MG tablet Take 81 mg by mouth daily.  . B Complex-C (SUPER B COMPLEX PO) Take 1 tablet by mouth daily.  . Biotin 1000 MCG tablet Take 1,000 mcg by mouth 2 (two) times daily.   . calcium citrate-vitamin D (CITRACAL+D) 315-200 MG-UNIT per tablet Take 1 tablet by mouth 2 (two) times daily.  . Cholecalciferol (VITAMIN D3) 5000 units CAPS Take 1 capsule by mouth daily.  Marland Kitchen glucosamine-chondroitin 500-400 MG tablet Take 1 tablet by mouth 2 (two) times daily.  . hydrochlorothiazide (MICROZIDE) 12.5 MG capsule Take 1 capsule (12.5 mg total) by mouth daily.  Marland Kitchen losartan (COZAAR) 50 MG tablet Take 1 tablet (50 mg total) by mouth daily.  Marland Kitchen lovastatin (MEVACOR) 10 MG tablet Take 1 tablet (10 mg total) by mouth at bedtime.  . Multiple Vitamin (MULTI-VITAMINS) TABS Take 1 tablet by mouth daily.     Past Medical History:  Diagnosis Date  . Colon cancer ([place])   . History of shingles    twice  . Hypercholesteremia   . Hypertension   . Left carotid bruit   . Nephrolithiasis   . Neuropathy   . Personal  history of chemotherapy 2014   Colon  . Personal history of radiation therapy 2014   Colon  . Urine incontinence 03/06/2013   h/o    Past Surgical History:  Procedure Laterality Date  . APPENDECTOMY  1979   colorectal  . CESAREAN SECTION    . COLONOSCOPY WITH PROPOFOL N/A 03/21/2015   Procedure: COLONOSCOPY WITH PROPOFOL;  Surgeon: Hulen Luster, MD;  Location: [facility] ENDOSCOPY;  Service: Gastroenterology;  Laterality: N/A;  . COLOSTOMY    . surgery for colorectal cancer    . VENTRAL HERNIA REPAIR Right [id number]    Social History Social History   Tobacco Use  . Smoking status: Never Smoker  . Smokeless tobacco: Never Used   Substance Use Topics  . Alcohol use: No    Alcohol/week: 0.0 standard drinks  . Drug use: No    Family History Family History  Problem Relation Age of Onset  . Hypertension Mother   . Hypertension Father   . Breast cancer Paternal Aunt   No family history of bleeding/clotting disorders, porphyria or autoimmune disease   No Known Allergies   REVIEW OF SYSTEMS (Negative unless checked)  Constitutional: [] Weight loss  [] Fever  [] Chills Cardiac: [] Chest pain   [] Chest pressure   [] Palpitations   [] Shortness of breath when laying flat   [] Shortness of breath with exertion. Vascular:  [] Pain in legs with walking   [] Pain in legs at rest  [] History of DVT   [] Phlebitis   [x] Swelling in legs   [] Varicose veins   [] Non-healing ulcers Pulmonary:   [] Uses home oxygen   [] Productive cough   [] Hemoptysis   [] Wheeze  [] COPD   [] Asthma Neurologic:  [] Dizziness   [] Seizures   [] History of stroke   [] History of TIA  [] Aphasia   [] Vissual changes   [] Weakness or numbness in arm   [] Weakness or numbness in leg Musculoskeletal:   [] Joint swelling   [x] Joint pain   [] Low back pain Hematologic:  [] Easy bruising  [] Easy bleeding   [] Hypercoagulable state   [] Anemic Gastrointestinal:  [] Diarrhea   [] Vomiting  [x] Gastroesophageal reflux/heartburn   [] Difficulty swallowing. Genitourinary:  [] Chronic kidney disease   [] Difficult urination  [] Frequent urination   [] Blood in urine Skin:  [] Rashes   [] Ulcers  Psychological:  [] History of anxiety   []  History of major depression.  Physical Examination  Vitals:   07/10/18 1408  BP: (!) 174/68  Pulse: (!) 56  Resp: 16  Weight: 151 lb 6.4 oz (68.7 kg)  Height: 5\' 3"  (1.6 m)   Body mass index is 26.82 kg/m. Gen: WD/WN, NAD Head: [place]/AT, No temporalis wasting.  Ear/Nose/Throat: Hearing grossly intact, nares w/o erythema or drainage, poor dentition Eyes: PER, EOMI, sclera nonicteric.  Neck: Supple, no masses.  No bruit or JVD.  Pulmonary:  Good air  movement, clear to auscultation bilaterally, no use of accessory muscles.  Cardiac: RRR, normal S1, S2, no Murmurs. Vascular:  Left carotid bruit,   2+ soft pitting edema bilateral legs Vessel Right Left  Radial Palpable Palpable  Brachial Palpable Palpable  Carotid Palpable Palpable  Gastrointestinal: soft, non-distended. No guarding/no peritoneal signs.  Musculoskeletal: M/S 5/5 throughout.  No deformity or atrophy.  Neurologic: CN 2-12 intact. Pain and light touch intact in extremities.  Symmetrical.  Speech is fluent. Motor exam as listed above. Psychiatric: Judgment intact, Mood & affect appropriate for pt's clinical situation. Dermatologic: No rashes or ulcers noted.  No changes consistent with cellulitis. Lymph : No Cervical lymphadenopathy, no lichenification or  skin changes of chronic lymphedema.  CBC Lab Results  Component Value Date   WBC 7.0 10/31/2017   HGB 12.6 10/31/2017   HCT 36.7 10/31/2017   MCV 103.1 (H) 10/31/2017   PLT 286.0 10/31/2017    BMET    Component Value Date/Time   NA 142 03/14/2018 0911   NA 140 11/30/2013 1121   K 4.5 03/14/2018 0911   K 4.0 11/30/2013 1121   CL 104 03/14/2018 0911   CL 105 11/30/2013 1121   CO2 30 03/14/2018 0911   CO2 31 11/30/2013 1121   GLUCOSE 116 (H) 03/14/2018 0911   GLUCOSE 103 (H) 11/30/2013 1121   BUN 24 (H) 03/14/2018 0911   BUN 14 11/30/2013 1121   CREATININE 0.95 03/14/2018 0911   CREATININE 0.83 11/30/2013 1121   CALCIUM 9.3 03/14/2018 0911   CALCIUM 8.2 (L) 11/30/2013 1121   GFRNONAA 58 (L) 08/14/2017 1405   GFRNONAA >60 11/30/2013 1121   GFRNONAA >60 08/31/2013 0917   GFRAA >60 08/14/2017 1405   GFRAA >60 11/30/2013 1121   GFRAA >60 08/31/2013 0917   CrCl cannot be calculated (Patient's most recent lab result is older than the maximum 21 days allowed.).  COAG Lab Results  Component Value Date   INR 1.1 10/21/2012    Radiology No results found.    Assessment/Plan 1. Bilateral carotid artery  stenosis Recommend:  Given the patient's asymptomatic subcritical stenosis no further invasive testing or surgery at this time.  Duplex ultrasound will be ordered as she is due for her annual scan  Continue antiplatelet therapy as prescribed Continue management of CAD, HTN and Hyperlipidemia Healthy heart diet,  encouraged exercise at least 4 times per week  - VAS US CAROTID; Future  2. Lymphedema  No surgery or intervention at this point in time.    I have reviewed my previous discussion with the patient regarding swelling and why it  causes symptoms.  The patient is doing well with compression and will continue wearing graduated compression stockings class 1 (20-30 mmHg) on a daily basis a prescription was given. The patient will  continue wearing the stockings first thing in the morning and removing them in the evening. The patient is instructed specifically not to sleep in the stockings.   Lymph pump was discussed and can be added to the treatment plan if needed.   In addition, behavioral modification including elevation during the day and exercise will be continued.    Patient should follow-up on an annual basis   3. Gastroesophageal reflux disease, esophagitis presence not specified Continue PPI as already ordered, this medication has been reviewed and there are no changes at this time.  Avoidence of caffeine and alcohol  Moderate elevation of the head of the bed   4. Hypercholesterolemia Continue statin as ordered and reviewed, no changes at this time    Hortencia Pilar, MD  07/10/2018 2:48 PM

## 2018-07-14 ENCOUNTER — Ambulatory Visit (INDEPENDENT_AMBULATORY_CARE_PROVIDER_SITE_OTHER): Payer: Medicare Other

## 2018-07-14 ENCOUNTER — Other Ambulatory Visit: Payer: Self-pay

## 2018-07-14 ENCOUNTER — Ambulatory Visit (INDEPENDENT_AMBULATORY_CARE_PROVIDER_SITE_OTHER): Payer: Medicare Other | Admitting: Nurse Practitioner

## 2018-07-14 ENCOUNTER — Encounter (INDEPENDENT_AMBULATORY_CARE_PROVIDER_SITE_OTHER): Payer: Self-pay | Admitting: Nurse Practitioner

## 2018-07-14 VITALS — BP 162/72 | HR 78 | Resp 14 | Ht 63.0 in | Wt 151.0 lb

## 2018-07-14 DIAGNOSIS — Z79899 Other long term (current) drug therapy: Secondary | ICD-10-CM

## 2018-07-14 DIAGNOSIS — I6522 Occlusion and stenosis of left carotid artery: Secondary | ICD-10-CM | POA: Diagnosis not present

## 2018-07-14 DIAGNOSIS — I6523 Occlusion and stenosis of bilateral carotid arteries: Secondary | ICD-10-CM

## 2018-07-14 DIAGNOSIS — E7800 Pure hypercholesterolemia, unspecified: Secondary | ICD-10-CM

## 2018-07-14 DIAGNOSIS — G8929 Other chronic pain: Secondary | ICD-10-CM

## 2018-07-14 DIAGNOSIS — Z791 Long term (current) use of non-steroidal anti-inflammatories (NSAID): Secondary | ICD-10-CM

## 2018-07-14 DIAGNOSIS — M25562 Pain in left knee: Secondary | ICD-10-CM | POA: Diagnosis not present

## 2018-07-14 DIAGNOSIS — Z7982 Long term (current) use of aspirin: Secondary | ICD-10-CM

## 2018-07-14 DIAGNOSIS — Z7902 Long term (current) use of antithrombotics/antiplatelets: Secondary | ICD-10-CM

## 2018-07-14 NOTE — Progress Notes (Signed)
SUBJECTIVE:  [name], female    DOB: Aug 12, 1935, 83 y.o.   MRN: [id number] Chief Complaint  Patient presents with  . Follow-up    HPI  Christina Mathis is a 83 y.o. female The patient is seen for follow up evaluation of carotid stenosis. The carotid stenosis followed by ultrasound.   The patient denies amaurosis fugax. There is no recent history of TIA symptoms or focal motor deficits. There is no prior documented CVA.  The patient is taking enteric-coated aspirin 81 mg daily.  There is no history of migraine headaches. There is no history of seizures.  The patient has a history of coronary artery disease, no recent episodes of angina or shortness of breath. The patient denies PAD or claudication symptoms. There is a history of hyperlipidemia which is being treated with a statin.    Carotid Duplex done today shows no evidence of stenosis within the right internal carotid artery, with 1 to 39% stenosis within the left.  No change compared to last study in 02/22/2017.  Past Medical History:  Diagnosis Date  . Colon cancer ([place])   . History of shingles    twice  . Hypercholesteremia   . Hypertension   . Left carotid bruit   . Nephrolithiasis   . Neuropathy   . Personal history of chemotherapy 2014   Colon  . Personal history of radiation therapy 2014   Colon  . Urine incontinence 03/06/2013   h/o    Past Surgical History:  Procedure Laterality Date  . APPENDECTOMY  1979   colorectal  . CESAREAN SECTION    . COLONOSCOPY WITH PROPOFOL N/A 03/21/2015   Procedure: COLONOSCOPY WITH PROPOFOL;  Surgeon: Hulen Luster, MD;  Location[facility] ENDOSCOPY;  Service: Gastroenterology;  Laterality: N/A;  . COLOSTOMY    . surgery for colorectal cancer    . VENTRAL HERNIA REPAIR Right [id number]    Social History   Socioeconomic History  . Marital status: Widowed    Spouse name: Not on file  . Number of children: Not on file  . Years of education: Not on file  .  Highest education level: Not on file  Occupational History  . Not on file  Social Needs  . Financial resource strain: Not hard at all  . Food insecurity    Worry: Never true    Inability: Never true  . Transportation needs    Medical: No    Non-medical: No  Tobacco Use  . Smoking status: Never Smoker  . Smokeless tobacco: Never Used  Substance and Sexual Activity  . Alcohol use: No    Alcohol/week: 0.0 standard drinks  . Drug use: No  . Sexual activity: Never  Lifestyle  . Physical activity    Days per week: 4 days    Minutes per session: 30 min  . Stress: Not at all  Relationships  . Social Herbalist on phone: Not on file    Gets together: Not on file    Attends religious service: Not on file    Active member of club or organization: Not on file    Attends meetings of clubs or organizations: Not on file    Relationship status: Not on file  . Intimate partner violence    Fear of current or ex partner: Not on file    Emotionally abused: Not on file    Physically abused: Not on file    Forced sexual activity: Not on file  Other Topics Concern  . Not on file  Social History Narrative  . Not on file    Family History  Problem Relation Age of Onset  . Hypertension Mother   . Hypertension Father   . Breast cancer Paternal Aunt     No Known Allergies   Review of Systems   Review of Systems: Negative Unless Checked Constitutional: [] Weight loss  [] Fever  [] Chills Cardiac: [] Chest pain   []  Atrial Fibrillation  [] Palpitations   [] Shortness of breath when laying flat   [] Shortness of breath with exertion. [] Shortness of breath at rest Vascular:  [] Pain in legs with walking   [] Pain in legs with standing [] Pain in legs when laying flat   [] Claudication    [] Pain in feet when laying flat    [] History of DVT   [] Phlebitis   [] Swelling in legs   [] Varicose veins   [] Non-healing ulcers Pulmonary:   [] Uses home oxygen   [] Productive cough   [] Hemoptysis    [] Wheeze  [] COPD   [] Asthma Neurologic:  [] Dizziness   [] Seizures  [] Blackouts [] History of stroke   [] History of TIA  [] Aphasia   [] Temporary Blindness   [] Weakness or numbness in arm   [] Weakness or numbness in leg Musculoskeletal:   [] Joint swelling   [] Joint pain   [] Low back pain  []  History of Knee Replacement [x] Arthritis [] back Surgeries  []  Spinal Stenosis    Hematologic:  [] Easy bruising  [] Easy bleeding   [] Hypercoagulable state   [] Anemic Gastrointestinal:  [] Diarrhea   [] Vomiting  [] Gastroesophageal reflux/heartburn   [] Difficulty swallowing. [] Abdominal pain Genitourinary:  [] Chronic kidney disease   [] Difficult urination  [] Anuric   [] Blood in urine [] Frequent urination  [] Burning with urination   [] Hematuria Skin:  [] Rashes   [] Ulcers [] Wounds Psychological:  [] History of anxiety   []  History of major depression  []  Memory Difficulties      OBJECTIVE:   Physical Exam  BP (!) 162/72 (BP Location: Left Arm, Patient Position: Sitting, Cuff Size: Normal)   Pulse 78   Resp 14   Ht 5\' 3"  (1.6 m)   Wt 151 lb (68.5 kg)   BMI 26.75 kg/m   Gen: WD/WN, NAD Head: [place]/AT, No temporalis wasting.  Ear/Nose/Throat: Hearing grossly intact, nares w/o erythema or drainage Eyes: PER, EOMI, sclera nonicteric.  Neck: Supple, no masses.  No JVD.  Left carotid bruit Pulmonary:  Good air movement, no use of accessory muscles.  Cardiac: RRR Vascular:  Vessel Right Left  Radial Palpable Palpable   Gastrointestinal: soft, non-distended. No guarding/no peritoneal signs.  Musculoskeletal: M/S 5/5 throughout.  No deformity or atrophy.  Neurologic: Pain and light touch intact in extremities.  Symmetrical.  Speech is fluent. Motor exam as listed above. Psychiatric: Judgment intact, Mood & affect appropriate for pt's clinical situation. Dermatologic: No Venous rashes. No Ulcers Noted.  No changes consistent with cellulitis. Lymph : No Cervical lymphadenopathy, no lichenification or skin changes of  chronic lymphedema.       ASSESSMENT AND PLAN:  1. Bilateral carotid artery stenosis Recommend:  Given the patient's asymptomatic subcritical stenosis no further invasive testing or surgery at this time.  Carotid Duplex done today shows no evidence of stenosis within the right internal carotid artery, with 1 to 39% stenosis within the left.  No change compared to last study in 02/22/2017.  Continue antiplatelet therapy as prescribed Continue management of CAD, HTN and Hyperlipidemia Healthy heart diet,  encouraged exercise at least 4 times per week Follow up in  12 months with duplex ultrasound and physical exam  - VAS US CAROTID; Future  2. Chronic pain of left knee Continue NSAID medications as already ordered, these medications have been reviewed and there are no changes at this time.  Continued activity and therapy was stressed.   3. Hypercholesterolemia Continue statin as ordered and reviewed, no changes at this time    Current Outpatient Medications on File Prior to Visit  Medication Sig Dispense Refill  . alendronate (FOSAMAX) 70 MG tablet TAKE 1 TABLET BY MOUTH ONCE A WEEK *TAKE  WITH  A  FULL  GLASS  OF  WATER  ON  AN  EMPTY  STOMACH* 4 tablet 11  . Alpha-Lipoic Acid 100 MG CAPS Take 1 capsule by mouth daily.     Marland Kitchen aspirin EC 81 MG tablet Take 81 mg by mouth daily.    . B Complex-C (SUPER B COMPLEX PO) Take 1 tablet by mouth daily.    . Biotin 1000 MCG tablet Take 1,000 mcg by mouth 2 (two) times daily.     . calcium citrate-vitamin D (CITRACAL+D) 315-200 MG-UNIT per tablet Take 1 tablet by mouth 2 (two) times daily.    . Cholecalciferol (VITAMIN D3) 5000 units CAPS Take 1 capsule by mouth daily.    . clobetasol ointment (TEMOVATE) 0.05 % Apply to affected area every night for 12 weeks 30 g 3  . glucosamine-chondroitin 500-400 MG tablet Take 1 tablet by mouth 2 (two) times daily.    . hydrochlorothiazide (MICROZIDE) 12.5 MG capsule Take 1 capsule (12.5 mg total) by  mouth daily. 90 capsule 1  . losartan (COZAAR) 50 MG tablet Take 1 tablet (50 mg total) by mouth daily. 90 tablet 1  . lovastatin (MEVACOR) 10 MG tablet Take 1 tablet (10 mg total) by mouth at bedtime. 90 tablet 1  . Multiple Vitamin (MULTI-VITAMINS) TABS Take 1 tablet by mouth daily.      Current Facility-Administered Medications on File Prior to Visit  Medication Dose Route Frequency Provider Last Rate Last Dose  . sodium chloride flush (NS) 0.9 % injection 10 mL  10 mL Intravenous PRN Forest Gleason, MD   10 mL at 05/31/15 1040  . sodium chloride flush (NS) 0.9 % injection 10 mL  10 mL Intravenous PRN Forest Gleason, MD   10 mL at 07/12/15 1018    There are no Patient Instructions on file for this visit. No follow-ups on file.   Kris Hartmann, NP  This note was completed with Sales executive.  Any errors are purely unintentional.

## 2018-07-21 ENCOUNTER — Telehealth: Payer: Self-pay

## 2018-07-21 ENCOUNTER — Other Ambulatory Visit: Payer: Self-pay | Admitting: Internal Medicine

## 2018-07-21 ENCOUNTER — Other Ambulatory Visit: Payer: Self-pay

## 2018-07-21 MED ORDER — LOVASTATIN 10 MG PO TABS: 10.0000 mg | ORAL_TABLET | Freq: Every day | ORAL | 1 refills | Status: DC

## 2018-07-21 MED ORDER — HYDROCHLOROTHIAZIDE 12.5 MG PO CAPS: 12.5000 mg | ORAL_CAPSULE | Freq: Every day | ORAL | 1 refills | Status: DC

## 2018-07-21 MED ORDER — LOSARTAN POTASSIUM 50 MG PO TABS: 50.0000 mg | ORAL_TABLET | Freq: Every day | ORAL | 1 refills | Status: DC

## 2018-07-21 MED ORDER — LOSARTAN POTASSIUM-HCTZ 50-12.5 MG PO TABS: 1.0000 | ORAL_TABLET | Freq: Every day | ORAL | 3 refills | Status: DC

## 2018-07-21 NOTE — Telephone Encounter (Signed)
Copied from [place] [id number]. Topic: Quick Communication - Rx Refill/Question >> Jul 21, 2018 10:00 AM Lionel December wrote: Medication:  hydrochlorothiazide (MICROZIDE) 12.5 MG capsule,  losartan (COZAAR) 50 MG tablet   lovastatin (MEVACOR) 10 MG tablet     Has the patient contacted their pharmacy? Yes.   (Agent: If no, request that the patient contact the pharmacy for the refill.) (Agent: If yes, when and what did the pharmacy advise?)  Preferred Pharmacy (with phone number or street name): [id number] - [place], [place] - [place] [phone] (Phone) [phone] (Fax)    Agent: Please be advised that RX refills may take up to 3 business days. We ask that you follow-up with your pharmacy.

## 2018-07-21 NOTE — Telephone Encounter (Signed)
Refills sent in.  Patient aware.  Patient request the combination tablet with hydrocholorodthiazide and losartan if available.

## 2018-07-21 NOTE — Telephone Encounter (Signed)
Noted  

## 2018-07-22 NOTE — Telephone Encounter (Signed)
No note attached. blank phone call

## 2018-07-23 ENCOUNTER — Other Ambulatory Visit (INDEPENDENT_AMBULATORY_CARE_PROVIDER_SITE_OTHER): Payer: Medicare Other

## 2018-07-23 ENCOUNTER — Other Ambulatory Visit: Payer: Self-pay

## 2018-07-23 DIAGNOSIS — E7800 Pure hypercholesterolemia, unspecified: Secondary | ICD-10-CM | POA: Diagnosis not present

## 2018-07-23 DIAGNOSIS — R739 Hyperglycemia, unspecified: Secondary | ICD-10-CM

## 2018-07-23 LAB — LIPID PANEL
Cholesterol: 111 mg/dL (ref 0–200)
HDL: 36 mg/dL — ABNORMAL LOW (ref >39.00)
LDL Cholesterol: 57 mg/dL (ref 0–99)
NonHDL: 74.9
Total CHOL/HDL Ratio: 3
Triglycerides: 89 mg/dL (ref 0.0–149.0)
VLDL: 17.8 mg/dL (ref 0.0–40.0)

## 2018-07-23 LAB — BASIC METABOLIC PANEL
BUN: 23 mg/dL (ref 6–23)
CO2: 31 mEq/L (ref 19–32)
Calcium: 9.3 mg/dL (ref 8.4–10.5)
Chloride: 104 mEq/L (ref 96–112)
Creatinine, Ser: 0.87 mg/dL (ref 0.40–1.20)
GFR: 62.52 mL/min (ref >60.00)
Glucose, Bld: 136 mg/dL — ABNORMAL HIGH (ref 70–99)
Potassium: 4.9 mEq/L (ref 3.5–5.1)
Sodium: 142 mEq/L (ref 135–145)

## 2018-07-23 LAB — HEPATIC FUNCTION PANEL
ALT: 22 U/L (ref 0–35)
AST: 23 U/L (ref 0–37)
Albumin: 3.9 g/dL (ref 3.5–5.2)
Alkaline Phosphatase: 48 U/L (ref 39–117)
Bilirubin, Direct: 0.2 mg/dL (ref 0.0–0.3)
Total Bilirubin: 0.9 mg/dL (ref 0.2–1.2)
Total Protein: 6.3 g/dL (ref 6.0–8.3)

## 2018-07-23 LAB — HEMOGLOBIN A1C: Hgb A1c MFr Bld: 5.8 % (ref 4.6–6.5)

## 2018-07-23 LAB — TSH: TSH: 3.13 u[IU]/mL (ref 0.35–4.50)

## 2018-07-25 ENCOUNTER — Encounter: Payer: Self-pay | Admitting: Internal Medicine

## 2018-07-25 ENCOUNTER — Other Ambulatory Visit: Payer: Self-pay

## 2018-07-25 ENCOUNTER — Ambulatory Visit (INDEPENDENT_AMBULATORY_CARE_PROVIDER_SITE_OTHER): Payer: Medicare Other | Admitting: Internal Medicine

## 2018-07-25 DIAGNOSIS — Z933 Colostomy status: Secondary | ICD-10-CM

## 2018-07-25 DIAGNOSIS — D7589 Other specified diseases of blood and blood-forming organs: Secondary | ICD-10-CM

## 2018-07-25 DIAGNOSIS — N9089 Other specified noninflammatory disorders of vulva and perineum: Secondary | ICD-10-CM

## 2018-07-25 DIAGNOSIS — I6523 Occlusion and stenosis of bilateral carotid arteries: Secondary | ICD-10-CM

## 2018-07-25 DIAGNOSIS — E7800 Pure hypercholesterolemia, unspecified: Secondary | ICD-10-CM | POA: Diagnosis not present

## 2018-07-25 DIAGNOSIS — R739 Hyperglycemia, unspecified: Secondary | ICD-10-CM

## 2018-07-25 NOTE — Progress Notes (Signed)
[name], female   DOB: [birth year], 83 y.o.   MRN: [id number]   Virtual Visit via video Note  This visit type was conducted due to national recommendations for restrictions regarding the COVID-19 pandemic (e.g. social distancing).  This format is felt to be most appropriate for this patient at this time.  All issues noted in this document were discussed and addressed.  No physical exam was performed (except for noted visual exam findings with Video Visits).   I connected with [name] by telephone and verified that I am speaking with the correct person using two identifiers. Location patient: home Location provider: work or home office Persons participating in the virtual visit: patient, provider  I discussed the limitations, risks, security and privacy concerns of performing an evaluation and management service by telephone and the availability of in person appointments. The patient expressed understanding and agreed to proceed.   Reason for visit: scheduled follow up.   HPI: She reports she is doing well.  Has been staying in due to covid restrictions.  No fever.  No cough, chest congestion or sob.  Saw [facility] for f/u carotid ultrasound. Documented 1-39% left and right no significant stenosis.  Recommended f/u in 12 months.  Also wearing compression hose.  Swelling better.  Tries to stay active.  No chest pain.  No sob.  No acdi reflux.  No abdominal pain.  Bowels moving.  Saw gyn 05/05/18.  Felt to have blood blister.  Given steroid cream.  Resolved.  Has f/u next week.  Recently had tick bite.  Saw Dr Kellie Moor.  States diagnosed with [place] tick.  No headache.  No fever. No rash. No joint aches. Previous allergies.  Used claritin.  Doing better now.     ROS: See pertinent positives and negatives per HPI.  Past Medical History:  Diagnosis Date  . Colon cancer ([place])   . History of shingles    twice  . Hypercholesteremia   . Hypertension   . Left carotid bruit    . Nephrolithiasis   . Neuropathy   . Personal history of chemotherapy 2014   Colon  . Personal history of radiation therapy 2014   Colon  . Urine incontinence 03/06/2013   h/o    Past Surgical History:  Procedure Laterality Date  . APPENDECTOMY  1979   colorectal  . CESAREAN SECTION    . COLONOSCOPY WITH PROPOFOL N/A 03/21/2015   Procedure: COLONOSCOPY WITH PROPOFOL;  Surgeon: Hulen Luster, MD;  Location: [facility] ENDOSCOPY;  Service: Gastroenterology;  Laterality: N/A;  . COLOSTOMY    . surgery for colorectal cancer    . VENTRAL HERNIA REPAIR Right [phone]    Family History  Problem Relation Age of Onset  . Hypertension Mother   . Hypertension Father   . Breast cancer Paternal Aunt     SOCIAL HX: reviewed.    Current Outpatient Medications:  .  alendronate (FOSAMAX) 70 MG tablet, TAKE 1 TABLET BY MOUTH ONCE A WEEK *TAKE  WITH  A  FULL  GLASS  OF  WATER  ON  AN  EMPTY  STOMACH*, Disp: 4 tablet, Rfl: 11 .  Alpha-Lipoic Acid 100 MG CAPS, Take 1 capsule by mouth daily. , Disp: , Rfl:  .  aspirin EC 81 MG tablet, Take 81 mg by mouth daily., Disp: , Rfl:  .  B Complex-C (SUPER B COMPLEX PO), Take 1 tablet by mouth daily., Disp: , Rfl:  .  Biotin 1000 MCG tablet,  Take 1,000 mcg by mouth 2 (two) times daily. , Disp: , Rfl:  .  calcium citrate-vitamin D (CITRACAL+D) 315-200 MG-UNIT per tablet, Take 1 tablet by mouth 2 (two) times daily., Disp: , Rfl:  .  Cholecalciferol (VITAMIN D3) 5000 units CAPS, Take 1 capsule by mouth daily., Disp: , Rfl:  .  clobetasol ointment (TEMOVATE) 0.05 %, Apply to affected area every night for 12 weeks, Disp: 30 g, Rfl: 3 .  glucosamine-chondroitin 500-400 MG tablet, Take 1 tablet by mouth 2 (two) times daily., Disp: , Rfl:  .  losartan-hydrochlorothiazide (HYZAAR) 50-12.5 MG tablet, Take 1 tablet by mouth daily., Disp: 90 tablet, Rfl: 3 .  lovastatin (MEVACOR) 10 MG tablet, Take 1 tablet (10 mg total) by mouth at bedtime., Disp: 90 tablet, Rfl: 1 .   Multiple Vitamin (MULTI-VITAMINS) TABS, Take 1 tablet by mouth daily. , Disp: , Rfl:  No current facility-administered medications for this visit.   Facility-Administered Medications Ordered in Other Visits:  .  sodium chloride flush (NS) 0.9 % injection 10 mL, 10 mL, Intravenous, PRN, Choksi, Janak, MD, 10 mL at 05/31/15 1040 .  sodium chloride flush (NS) 0.9 % injection 10 mL, 10 mL, Intravenous, PRN, Choksi, Janak, MD, 10 mL at 07/12/15 1018  EXAM:  GENERAL: alert.  Sounds to be in no acute distress.  Answering questions appropriately.    PSYCH/NEURO: pleasant and cooperative, no obvious depression or anxiety, speech and thought processing grossly intact  ASSESSMENT AND PLAN:  Discussed the following assessment and plan:  Carotid artery disease ([place]) Has seen [facility].  Carotid ultrasound results reviewed.  Left 1-39% and right no significant stenosis.  Recommended f/u in 12 months.    Colostomy status ([place]) Ostomy working well.    Hypercholesterolemia On lovastatin.  Low cholesterol diet and exercise.  Follow lipid panel and liver function tests.    Hyperglycemia Low carb diet and exercise.  Follow met b and a1c.    Labial lesion Saw gyn. Treated.  Resolved.      I discussed the assessment and treatment plan with the patient. The patient was provided an opportunity to ask questions and all were answered. The patient agreed with the plan and demonstrated an understanding of the instructions.   The patient was advised to call back or seek an in-person evaluation if the symptoms worsen or if the condition fails to improve as anticipated.  I provided 14 minutes of non-face-to-face time during this encounter.   Einar Pheasant, MD

## 2018-07-27 ENCOUNTER — Encounter: Payer: Self-pay | Admitting: Internal Medicine

## 2018-07-27 NOTE — Assessment & Plan Note (Signed)
On lovastatin.  Low cholesterol diet and exercise.  Follow lipid panel and liver function tests.   

## 2018-07-27 NOTE — Assessment & Plan Note (Signed)
Has seen [facility].  Carotid ultrasound results reviewed.  Left 1-39% and right no significant stenosis.  Recommended f/u in 12 months.

## 2018-07-27 NOTE — Assessment & Plan Note (Signed)
Low carb diet and exercise.  Follow met b and a1c.   

## 2018-07-27 NOTE — Assessment & Plan Note (Signed)
Ostomy working well.  

## 2018-07-27 NOTE — Assessment & Plan Note (Signed)
Saw gyn. Treated.  Resolved.

## 2018-07-28 ENCOUNTER — Ambulatory Visit: Payer: Medicare Other | Admitting: Obstetrics and Gynecology

## 2018-07-28 ENCOUNTER — Other Ambulatory Visit: Payer: Self-pay

## 2018-07-28 ENCOUNTER — Encounter: Payer: Self-pay | Admitting: Obstetrics and Gynecology

## 2018-07-28 VITALS — BP 140/70 | HR 75 | Ht 63.0 in | Wt 147.0 lb

## 2018-07-28 DIAGNOSIS — L900 Lichen sclerosus et atrophicus: Secondary | ICD-10-CM | POA: Diagnosis not present

## 2018-07-28 DIAGNOSIS — N952 Postmenopausal atrophic vaginitis: Secondary | ICD-10-CM | POA: Diagnosis not present

## 2018-07-28 DIAGNOSIS — N9089 Other specified noninflammatory disorders of vulva and perineum: Secondary | ICD-10-CM | POA: Diagnosis not present

## 2018-07-28 DIAGNOSIS — N393 Stress incontinence (female) (male): Secondary | ICD-10-CM | POA: Diagnosis not present

## 2018-07-28 NOTE — Progress Notes (Signed)
[name], female   DOB: 11-26-1935, 83 y.o.   MRN: [id number]  Reason for Consult: Follow-up (Recheck vagina to see if medication is working )   Referred by Einar Pheasant, MD  Subjective:     HPI:  Christina Mathis is a 83 y.o. female. She os following up today to recheck a small spot which was seen on her labia. She has been using clobetasol cream to help with lichen sclerosis. She reports that the small blood blister is still there. It has not changed.   Past Medical History:  Diagnosis Date  . Colon cancer ([place])   . History of shingles    twice  . Hypercholesteremia   . Hypertension   . Left carotid bruit   . Nephrolithiasis   . Neuropathy   . Personal history of chemotherapy 2014   Colon  . Personal history of radiation therapy 2014   Colon  . Urine incontinence 03/06/2013   h/o   Family History  Problem Relation Age of Onset  . Hypertension Mother   . Hypertension Father   . Breast cancer Paternal Aunt    Past Surgical History:  Procedure Laterality Date  . APPENDECTOMY  1979   colorectal  . CESAREAN SECTION    . COLONOSCOPY WITH PROPOFOL N/A 03/21/2015   Procedure: COLONOSCOPY WITH PROPOFOL;  Surgeon: Hulen Luster, MD;  Location: [facility] ENDOSCOPY;  Service: Gastroenterology;  Laterality: N/A;  . COLOSTOMY    . surgery for colorectal cancer    . VENTRAL HERNIA REPAIR Right [phone]    Short Social History:  Social History   Tobacco Use  . Smoking status: Never Smoker  . Smokeless tobacco: Never Used  Substance Use Topics  . Alcohol use: No    Alcohol/week: 0.0 standard drinks    No Known Allergies  Current Outpatient Medications  Medication Sig Dispense Refill  . alendronate (FOSAMAX) 70 MG tablet TAKE 1 TABLET BY MOUTH ONCE A WEEK *TAKE  WITH  A  FULL  GLASS  OF  WATER  ON  AN  EMPTY  STOMACH* 4 tablet 11  . Alpha-Lipoic Acid 100 MG CAPS Take 1 capsule by mouth daily.     Marland Kitchen aspirin EC 81 MG tablet Take 81 mg by mouth daily.    . B  Complex-C (SUPER B COMPLEX PO) Take 1 tablet by mouth daily.    . Biotin 1000 MCG tablet Take 1,000 mcg by mouth 2 (two) times daily.     . calcium citrate-vitamin D (CITRACAL+D) 315-200 MG-UNIT per tablet Take 1 tablet by mouth 2 (two) times daily.    . Cholecalciferol (VITAMIN D3) 5000 units CAPS Take 1 capsule by mouth daily.    . clobetasol ointment (TEMOVATE) 0.05 % Apply to affected area every night for 12 weeks 30 g 3  . glucosamine-chondroitin 500-400 MG tablet Take 1 tablet by mouth 2 (two) times daily.    Marland Kitchen losartan-hydrochlorothiazide (HYZAAR) 50-12.5 MG tablet Take 1 tablet by mouth daily. 90 tablet 3  . lovastatin (MEVACOR) 10 MG tablet Take 1 tablet (10 mg total) by mouth at bedtime. 90 tablet 1  . Multiple Vitamin (MULTI-VITAMINS) TABS Take 1 tablet by mouth daily.      No current facility-administered medications for this visit.    Facility-Administered Medications Ordered in Other Visits  Medication Dose Route Frequency Provider Last Rate Last Dose  . sodium chloride flush (NS) 0.9 % injection 10 mL  10 mL Intravenous PRN Forest Gleason, MD  10 mL at 05/31/15 1040  . sodium chloride flush (NS) 0.9 % injection 10 mL  10 mL Intravenous PRN Choksi, Janak, MD   10 mL at 07/12/15 1018    REVIEW OF SYSTEMS      Objective:  Objective   Vitals:   07/28/18 0958  BP: 140/70  Pulse: 75  Weight: 147 lb (66.7 kg)  Height: 5\' 3"  (1.6 m)   Body mass index is 26.04 kg/m.  Physical Exam Vitals signs and nursing note reviewed.  Constitutional:      Appearance: She is well-developed.  HENT:     Head: Normocephalic and atraumatic.  Eyes:     Pupils: Pupils are equal, round, and reactive to light.  Cardiovascular:     Rate and Rhythm: Normal rate and regular rhythm.  Pulmonary:     Effort: Pulmonary effort is normal. No respiratory distress.  Genitourinary:      Comments: Lichen sclerosis and atrophy os vaginal tissue and vulva. Small blood blister still present, no  changes.  Stress urinary incontinence visible on examination today.  Skin:    General: Skin is warm and dry.  Neurological:     Mental Status: She is alert and oriented to person, place, and time.  Psychiatric:        Behavior: Behavior normal.        Thought Content: Thought content normal.        Judgment: Judgment normal.         Assessment/Plan:    83 yo with lichen sclerosis and vulvar lesion (blood blister) Blood blister is stable, continue with clobetasol 1-2 times a week. Will recheck vulva yearly.  Stress Urinary incontinence-reviewed option for management such as pessary with patient.   More than 15 minutes were spent face to face with the patient in the room with more than 50% of the time spent providing counseling and discussing the plan of management.   Adrian Prows MD [facility], [facility] 08/04/2018 6:40 AM

## 2018-08-12 ENCOUNTER — Other Ambulatory Visit: Payer: Self-pay

## 2018-08-12 DIAGNOSIS — C20 Malignant neoplasm of rectum: Secondary | ICD-10-CM

## 2018-08-13 ENCOUNTER — Inpatient Hospital Stay (HOSPITAL_BASED_OUTPATIENT_CLINIC_OR_DEPARTMENT_OTHER): Payer: Medicare Other | Admitting: Oncology

## 2018-08-13 ENCOUNTER — Other Ambulatory Visit: Payer: Medicare Other

## 2018-08-13 ENCOUNTER — Encounter: Payer: Self-pay | Admitting: Oncology

## 2018-08-13 ENCOUNTER — Ambulatory Visit: Payer: Medicare Other | Admitting: Oncology

## 2018-08-13 ENCOUNTER — Other Ambulatory Visit: Payer: Self-pay

## 2018-08-13 ENCOUNTER — Inpatient Hospital Stay: Payer: Medicare Other | Attending: Oncology

## 2018-08-13 VITALS — BP 180/78 | HR 66 | Temp 97.5°F | Ht 63.0 in | Wt 148.0 lb

## 2018-08-13 DIAGNOSIS — Z79899 Other long term (current) drug therapy: Secondary | ICD-10-CM

## 2018-08-13 DIAGNOSIS — Z923 Personal history of irradiation: Secondary | ICD-10-CM

## 2018-08-13 DIAGNOSIS — G629 Polyneuropathy, unspecified: Secondary | ICD-10-CM

## 2018-08-13 DIAGNOSIS — R0989 Other specified symptoms and signs involving the circulatory and respiratory systems: Secondary | ICD-10-CM | POA: Diagnosis not present

## 2018-08-13 DIAGNOSIS — Z9221 Personal history of antineoplastic chemotherapy: Secondary | ICD-10-CM

## 2018-08-13 DIAGNOSIS — I10 Essential (primary) hypertension: Secondary | ICD-10-CM | POA: Diagnosis not present

## 2018-08-13 DIAGNOSIS — E7800 Pure hypercholesterolemia, unspecified: Secondary | ICD-10-CM | POA: Insufficient documentation

## 2018-08-13 DIAGNOSIS — I890 Lymphedema, not elsewhere classified: Secondary | ICD-10-CM

## 2018-08-13 DIAGNOSIS — Z7982 Long term (current) use of aspirin: Secondary | ICD-10-CM | POA: Insufficient documentation

## 2018-08-13 DIAGNOSIS — C20 Malignant neoplasm of rectum: Secondary | ICD-10-CM

## 2018-08-13 DIAGNOSIS — Z85048 Personal history of other malignant neoplasm of rectum, rectosigmoid junction, and anus: Secondary | ICD-10-CM | POA: Insufficient documentation

## 2018-08-13 DIAGNOSIS — Z803 Family history of malignant neoplasm of breast: Secondary | ICD-10-CM

## 2018-08-13 LAB — CBC WITH DIFFERENTIAL/PLATELET
Abs Immature Granulocytes: 0.03 10*3/uL (ref 0.00–0.07)
Basophils Absolute: 0.1 10*3/uL (ref 0.0–0.1)
Basophils Relative: 1 %
Eosinophils Absolute: 0.1 10*3/uL (ref 0.0–0.5)
Eosinophils Relative: 1 %
HCT: 37.4 % (ref 36.0–46.0)
Hemoglobin: 12.4 g/dL (ref 12.0–15.0)
Immature Granulocytes: 0 %
Lymphocytes Relative: 14 %
Lymphs Abs: 1.1 10*3/uL (ref 0.7–4.0)
MCH: 34.1 pg — ABNORMAL HIGH (ref 26.0–34.0)
MCHC: 33.2 g/dL (ref 30.0–36.0)
MCV: 102.7 fL — ABNORMAL HIGH (ref 80.0–100.0)
Monocytes Absolute: 0.8 10*3/uL (ref 0.1–1.0)
Monocytes Relative: 10 %
Neutro Abs: 5.9 10*3/uL (ref 1.7–7.7)
Neutrophils Relative %: 74 %
Platelets: 248 10*3/uL (ref 150–400)
RBC: 3.64 MIL/uL — ABNORMAL LOW (ref 3.87–5.11)
RDW: 13.2 % (ref 11.5–15.5)
WBC: 7.9 10*3/uL (ref 4.0–10.5)
nRBC: 0 % (ref 0.0–0.2)

## 2018-08-13 LAB — COMPREHENSIVE METABOLIC PANEL
ALT: 24 U/L (ref 0–44)
AST: 27 U/L (ref 15–41)
Albumin: 3.8 g/dL (ref 3.5–5.0)
Alkaline Phosphatase: 49 U/L (ref 38–126)
Anion gap: 7 (ref 5–15)
BUN: 24 mg/dL — ABNORMAL HIGH (ref 8–23)
CO2: 28 mmol/L (ref 22–32)
Calcium: 8.9 mg/dL (ref 8.9–10.3)
Chloride: 106 mmol/L (ref 98–111)
Creatinine, Ser: 0.78 mg/dL (ref 0.44–1.00)
GFR calc Af Amer: >60 mL/min (ref >60)
GFR calc non Af Amer: >60 mL/min (ref >60)
Glucose, Bld: 112 mg/dL — ABNORMAL HIGH (ref 70–99)
Potassium: 3.7 mmol/L (ref 3.5–5.1)
Sodium: 141 mmol/L (ref 135–145)
Total Bilirubin: 0.7 mg/dL (ref 0.3–1.2)
Total Protein: 7 g/dL (ref 6.5–8.1)

## 2018-08-13 NOTE — Progress Notes (Signed)
Patient stated that she had been doing well. 

## 2018-08-14 LAB — CEA: CEA: 2.5 ng/mL (ref 0.0–4.7)

## 2018-08-14 NOTE — Progress Notes (Signed)
[place]  [phone]  [phone]     Christina Mathis DOB: [birth year]  MR#: [id number]  RKY#:[id number]  Patient Care Team: Einar Pheasant, MD as PCP - General (Internal Medicine) Paulla Dolly Tamala Fothergill, DPM as Consulting Physician (Podiatry) Grayland Ormond Kathlene November, MD as Consulting Physician (Oncology)  CHIEF COMPLAINT: Adenocarcinoma of the rectum.  INTERVAL HISTORY: Patient returns to clinic today for repeat laboratory work and routine yearly evaluation.  She continues to feel well and remains asymptomatic.  She reports no problem with her colostomy. She has a mild persistent neuropathy that does not affect her day-to-day activity.  She has no other neurologic complaints.  She denies any recent fevers or illnesses.  She has no chest pain, shortness of breath, cough, or hemoptysis.  She has a good appetite and denies weight loss.  She has no nausea, vomiting, constipation, or diarrhea.  She reports no melena or hematochezia.  She has no urinary complaints.  Patient feels at her baseline offers no specific complaints today.    REVIEW OF SYSTEMS:   Review of Systems  Constitutional: Negative.  Negative for fever, malaise/fatigue and weight loss.  Respiratory: Negative.  Negative for cough and shortness of breath.   Cardiovascular: Negative.  Negative for chest pain and leg swelling.  Gastrointestinal: Negative.  Negative for abdominal pain, blood in stool and melena.  Genitourinary: Negative.  Negative for dysuria.  Musculoskeletal: Negative.  Negative for back pain.  Skin: Negative.  Negative for rash.  Neurological: Positive for sensory change. Negative for tingling, focal weakness, weakness and headaches.  Psychiatric/Behavioral: Negative.  The patient is not nervous/anxious.     As per HPI. Otherwise, a complete review of systems is negative.  ONCOLOGY HISTORY: Oncology History Overview Note  59. 83 year old female with stage III (T3, N1, M0)  adenocarcinoma of the rectum for  preop chemoradiation 2. Starting 5-FU by continuous infusion and radiation therapy November 24, 2012 3. Patient is finishing up radiation and chemotherapy on December 15 of 2014 4. Status postsurgery and colostomyFebruary of 2015, pT0 pNO tumor stage 0 5. Post operative adjuvant with FOLFOX march 2015 6.patient finished total 6 cycles of chemotherapy with FOLFOX on August 31, 2013. 7.  January 2 016 [name] had hernia repair     PAST MEDICAL HISTORY: Past Medical History:  Diagnosis Date  . Colon cancer ([place])   . History of shingles    twice  . Hypercholesteremia   . Hypertension   . Left carotid bruit   . Nephrolithiasis   . Neuropathy   . Personal history of chemotherapy 2014   Colon  . Personal history of radiation therapy 2014   Colon  . Urine incontinence 03/06/2013   h/o    PAST SURGICAL HISTORY: Past Surgical History:  Procedure Laterality Date  . APPENDECTOMY  1979   colorectal  . CESAREAN SECTION    . COLONOSCOPY WITH PROPOFOL N/A 03/21/2015   Procedure: COLONOSCOPY WITH PROPOFOL;  Surgeon: Hulen Luster, MD;  Location: [facility] ENDOSCOPY;  Service: Gastroenterology;  Laterality: N/A;  . COLOSTOMY    . surgery for colorectal cancer    . VENTRAL HERNIA REPAIR Right [phone]    FAMILY HISTORY Family History  Problem Relation Age of Onset  . Hypertension Mother   . Hypertension Father   . Breast cancer Paternal Aunt     GYNECOLOGIC HISTORY:  No LMP recorded. Patient is postmenopausal.     ADVANCED DIRECTIVES:    HEALTH MAINTENANCE: Social History   Tobacco Use  .  Smoking status: Never Smoker  . Smokeless tobacco: Never Used  Substance Use Topics  . Alcohol use: No    Alcohol/week: 0.0 standard drinks  . Drug use: No     No Known Allergies  Current Outpatient Medications  Medication Sig Dispense Refill  . alendronate (FOSAMAX) 70 MG tablet TAKE 1 TABLET BY MOUTH ONCE A WEEK *TAKE  WITH  A  FULL  GLASS  OF  WATER  ON   AN  EMPTY  STOMACH* 4 tablet 11  . Alpha-Lipoic Acid 100 MG CAPS Take 1 capsule by mouth daily.     Marland Kitchen aspirin EC 81 MG tablet Take 81 mg by mouth daily.    . B Complex-C (SUPER B COMPLEX PO) Take 1 tablet by mouth daily.    . Biotin 1000 MCG tablet Take 1,000 mcg by mouth 2 (two) times daily.     . calcium citrate-vitamin D (CITRACAL+D) 315-200 MG-UNIT per tablet Take 1 tablet by mouth 2 (two) times daily.    . Cholecalciferol (VITAMIN D3) 5000 units CAPS Take 1 capsule by mouth daily.    . clobetasol ointment (TEMOVATE) 0.05 % Apply to affected area every night for 12 weeks 30 g 3  . glucosamine-chondroitin 500-400 MG tablet Take 1 tablet by mouth 2 (two) times daily.    Marland Kitchen losartan-hydrochlorothiazide (HYZAAR) 50-12.5 MG tablet Take 1 tablet by mouth daily. 90 tablet 3  . lovastatin (MEVACOR) 10 MG tablet Take 1 tablet (10 mg total) by mouth at bedtime. 90 tablet 1  . Multiple Vitamin (MULTI-VITAMINS) TABS Take 1 tablet by mouth daily.      No current facility-administered medications for this visit.    Facility-Administered Medications Ordered in Other Visits  Medication Dose Route Frequency Provider Last Rate Last Dose  . sodium chloride flush (NS) 0.9 % injection 10 mL  10 mL Intravenous PRN Forest Gleason, MD   10 mL at 05/31/15 1040  . sodium chloride flush (NS) 0.9 % injection 10 mL  10 mL Intravenous PRN Choksi, Delorise Shiner, MD   10 mL at 07/12/15 1018    OBJECTIVE: BP (!) 180/78 (BP Location: Right Arm, Patient Position: Sitting)   Pulse 66   Temp (!) 97.5 F (36.4 C) (Tympanic)   Ht 5\' 3"  (1.6 m)   Wt 148 lb (67.1 kg)   BMI 26.22 kg/m    Body mass index is 26.22 kg/m.    ECOG FS:0 - Asymptomatic  General: Well-developed, well-nourished, no acute distress. Eyes: Pink conjunctiva, anicteric sclera. HEENT: Normocephalic, moist mucous membranes, clear oropharnyx. Lungs: Clear to auscultation bilaterally. Heart: Regular rate and rhythm. No rubs, murmurs, or gallops. Abdomen: Soft,  nontender, nondistended. No organomegaly noted, normoactive bowel sounds.  Colostomy noted. Musculoskeletal: No edema, cyanosis, or clubbing. Neuro: Alert, answering all questions appropriately. Cranial nerves grossly intact. Skin: No rashes or petechiae noted. Psych: Normal affect.  LAB RESULTS:  Orders Only on 08/13/2018  Component Date Value Ref Range Status  . Sodium 08/13/2018 141  135 - 145 mmol/L Final  . Potassium 08/13/2018 3.7  3.5 - 5.1 mmol/L Final  . Chloride 08/13/2018 106  98 - 111 mmol/L Final  . CO2 08/13/2018 28  22 - 32 mmol/L Final  . Glucose, Bld 08/13/2018 112* 70 - 99 mg/dL Final  . BUN 08/13/2018 24* 8 - 23 mg/dL Final  . Creatinine, Ser 08/13/2018 0.78  0.44 - 1.00 mg/dL Final  . Calcium 08/13/2018 8.9  8.9 - 10.3 mg/dL Final  . Total Protein 08/13/2018 7.0  6.5 - 8.1 g/dL Final  . Albumin 08/13/2018 3.8  3.5 - 5.0 g/dL Final  . AST 08/13/2018 27  15 - 41 U/L Final  . ALT 08/13/2018 24  0 - 44 U/L Final  . Alkaline Phosphatase 08/13/2018 49  38 - 126 U/L Final  . Total Bilirubin 08/13/2018 0.7  0.3 - 1.2 mg/dL Final  . GFR calc non Af Amer 08/13/2018 >60  >60 mL/min Final  . GFR calc Af Amer 08/13/2018 >60  >60 mL/min Final  . Anion gap 08/13/2018 7  5 - 15 Final   Performed at [facility], [address] [place], [place] [address]  . WBC 08/13/2018 7.9  4.0 - 10.5 K/uL Final  . RBC 08/13/2018 3.64* 3.87 - 5.11 MIL/uL Final  . Hemoglobin 08/13/2018 12.4  12.0 - 15.0 g/dL Final  . HCT 08/13/2018 37.4  36.0 - 46.0 % Final  . MCV 08/13/2018 102.7* 80.0 - 100.0 fL Final  . MCH 08/13/2018 34.1* 26.0 - 34.0 pg Final  . MCHC 08/13/2018 33.2  30.0 - 36.0 g/dL Final  . RDW 08/13/2018 13.2  11.5 - 15.5 % Final  . Platelets 08/13/2018 248  150 - 400 K/uL Final  . nRBC 08/13/2018 0.0  0.0 - 0.2 % Final  . Neutrophils Relative % 08/13/2018 74  % Final  . Neutro Abs 08/13/2018 5.9  1.7 - 7.7 K/uL Final  . Lymphocytes Relative 08/13/2018 14  % Final  .  Lymphs Abs 08/13/2018 1.1  0.7 - 4.0 K/uL Final  . Monocytes Relative 08/13/2018 10  % Final  . Monocytes Absolute 08/13/2018 0.8  0.1 - 1.0 K/uL Final  . Eosinophils Relative 08/13/2018 1  % Final  . Eosinophils Absolute 08/13/2018 0.1  0.0 - 0.5 K/uL Final  . Basophils Relative 08/13/2018 1  % Final  . Basophils Absolute 08/13/2018 0.1  0.0 - 0.1 K/uL Final  . Immature Granulocytes 08/13/2018 0  % Final  . Abs Immature Granulocytes 08/13/2018 0.03  0.00 - 0.07 K/uL Final   Performed at [facility], [address] [place], [place] [address]   Lab Results  Component Value Date   CEA 2.3 01/11/2016    STUDIES: No results found.  ASSESSMENT:  Adenocarcinoma of the rectum, stage III, T3 N1 M0.  PLAN:    1. Rectal cancer: No evidence of disease.  Patient completed 6 cycles of adjuvant FOLFOX in August 2015. Patient's most recent colonoscopy on March 21, 2015 was reported as normal with no evidence of progressive or recurrent disease.  Recommendation was to repeat in 5 years in February 2022.  CEA continues to be within normal limits at 2.4, today's result is pending.  No intervention is needed at this time.  After lengthy discussion with the patient, she wishes to continue to return to clinic on a yearly basis until her colonoscopy in 2022.  If colonoscopy is reported as normal, patient will then be discharged from clinic.  Return to clinic in 1 year with repeat laboratory work and further evaluation.   2. Lymphedema in the left lower extremity: Continue compression stockings as instructed by lymphedema clinic.  3. Peripheral neuropathy: Unchanged. Patient has discontinued Lyrica and gabapentin. 4.  Elevated MCV: Chronic and unchanged.  No intervention needed.   Patient expressed understanding and was in agreement with this plan. She also understands that She can call clinic at any time with any questions, concerns, or complaints.    Lloyd Huger, MD 08/14/18 7:17 AM

## 2018-08-18 DIAGNOSIS — Z85038 Personal history of other malignant neoplasm of large intestine: Secondary | ICD-10-CM | POA: Diagnosis not present

## 2018-08-18 DIAGNOSIS — Z933 Colostomy status: Secondary | ICD-10-CM | POA: Diagnosis not present

## 2018-11-03 ENCOUNTER — Other Ambulatory Visit: Payer: Self-pay

## 2018-11-03 ENCOUNTER — Telehealth: Payer: Self-pay | Admitting: Internal Medicine

## 2018-11-03 MED ORDER — ALENDRONATE SODIUM 70 MG PO TABS: ORAL_TABLET | ORAL | 11 refills | Status: DC

## 2018-11-03 NOTE — Telephone Encounter (Signed)
Medication has been refilled. Pt aware

## 2018-11-03 NOTE — Telephone Encounter (Signed)
Patient requesting alendronate (FOSAMAX) 70 MG tablet, informed patient please allow 48 to 72 hour turn around time. Patient would like to know PCP thoughts should she wait until Feb follow up appointment or come in now for flu shot, please advise.  [id number] - [address], [place] [phone] (Phone) [phone] (Fax

## 2018-11-06 ENCOUNTER — Other Ambulatory Visit: Payer: Self-pay | Admitting: Internal Medicine

## 2018-11-06 DIAGNOSIS — Z1231 Encounter for screening mammogram for malignant neoplasm of breast: Secondary | ICD-10-CM

## 2018-11-26 ENCOUNTER — Encounter: Payer: Self-pay | Admitting: Podiatry

## 2018-11-26 ENCOUNTER — Other Ambulatory Visit: Payer: Self-pay

## 2018-11-26 ENCOUNTER — Ambulatory Visit (INDEPENDENT_AMBULATORY_CARE_PROVIDER_SITE_OTHER): Payer: Medicare Other | Admitting: Podiatry

## 2018-11-26 ENCOUNTER — Ambulatory Visit: Payer: Medicare Other

## 2018-11-26 DIAGNOSIS — M778 Other enthesopathies, not elsewhere classified: Secondary | ICD-10-CM | POA: Diagnosis not present

## 2018-11-26 NOTE — Progress Notes (Signed)
She presents today with a chief concern of painful first metatarsophalangeal joint and amputation site second toe left.  He states that seems like the bone is sticking out further.  She does not want to do any surgery she is wondering if there was a type of brace that she can wear.  Objective: Vital signs are stable she is alert and oriented x3 pulses left foot intact no open lesions or wounds.  Hallux valgus deformity with a prominent hypertrophic medial condyle.  Tenderness on palpation of the second metatarsal head.  Assessment: Painful amputation site second toe left painful bunion deformity left.  Plan: Dispensed a digital Darco splint to help hold the right toe in position.  Discussed the possible need for surgery she declined.

## 2018-11-27 ENCOUNTER — Other Ambulatory Visit: Payer: Self-pay

## 2018-11-28 ENCOUNTER — Other Ambulatory Visit: Payer: Self-pay

## 2018-11-28 ENCOUNTER — Ambulatory Visit (INDEPENDENT_AMBULATORY_CARE_PROVIDER_SITE_OTHER): Payer: Medicare Other

## 2018-11-28 DIAGNOSIS — Z23 Encounter for immunization: Secondary | ICD-10-CM

## 2018-12-03 DIAGNOSIS — Z0120 Encounter for dental examination and cleaning without abnormal findings: Secondary | ICD-10-CM | POA: Diagnosis not present

## 2018-12-10 DIAGNOSIS — H2513 Age-related nuclear cataract, bilateral: Secondary | ICD-10-CM | POA: Diagnosis not present

## 2018-12-17 ENCOUNTER — Ambulatory Visit
Admission: RE | Admit: 2018-12-17 | Discharge: 2018-12-17 | Disposition: A | Discharge status: Home / Self Care | Payer: Medicare Other | Source: Ambulatory Visit | Attending: Internal Medicine | Admitting: Internal Medicine

## 2018-12-17 DIAGNOSIS — E2839 Other primary ovarian failure: Secondary | ICD-10-CM

## 2018-12-17 DIAGNOSIS — Z1231 Encounter for screening mammogram for malignant neoplasm of breast: Secondary | ICD-10-CM

## 2018-12-17 DIAGNOSIS — M85851 Other specified disorders of bone density and structure, right thigh: Secondary | ICD-10-CM | POA: Diagnosis not present

## 2018-12-22 ENCOUNTER — Other Ambulatory Visit: Payer: Self-pay

## 2018-12-22 ENCOUNTER — Encounter: Payer: Self-pay | Admitting: Internal Medicine

## 2018-12-22 ENCOUNTER — Ambulatory Visit (INDEPENDENT_AMBULATORY_CARE_PROVIDER_SITE_OTHER): Payer: Medicare Other | Admitting: Internal Medicine

## 2018-12-22 DIAGNOSIS — M8580 Other specified disorders of bone density and structure, unspecified site: Secondary | ICD-10-CM | POA: Diagnosis not present

## 2018-12-22 DIAGNOSIS — I6523 Occlusion and stenosis of bilateral carotid arteries: Secondary | ICD-10-CM

## 2018-12-22 DIAGNOSIS — E7800 Pure hypercholesterolemia, unspecified: Secondary | ICD-10-CM | POA: Diagnosis not present

## 2018-12-22 DIAGNOSIS — Z85048 Personal history of other malignant neoplasm of rectum, rectosigmoid junction, and anus: Secondary | ICD-10-CM

## 2018-12-22 NOTE — Progress Notes (Signed)
[name], female   DOB: [birth year], 83 y.o.   MRN: [id number]   Virtual Visit via telephone Note  This visit type was conducted due to national recommendations for restrictions regarding the COVID-19 pandemic (e.g. social distancing).  This format is felt to be most appropriate for this patient at this time.  All issues noted in this document were discussed and addressed.  No physical exam was performed (except for noted visual exam findings with Video Visits).   I connected with telephone by telephone and verified that I am speaking with the correct person using two identifiers. Location patient: home Location provider: work  Persons participating in the telephone visit: patient, provider  I discussed the limitations, risks, security and privacy concerns of performing an evaluation and management service by telephone and the availability of in person appointments.  The patient expressed understanding and agreed to proceed.   Reason for visit: work in appt  HPI: She reports she is doing relatively well.  Seeing podiatry - capsulitis.  Saw Dr Grayland Ormond 07/2018 for f/u rectal cancer.  Stable.  Recommended f/u colonoscopy 03/2020. Here to discuss bone density.  Bone density reveals osteopenia. Stable from last check.  On fosamax.  Started 09/2015.  Discussed treatment options.  Discussed calcium, vitamin D and weight bearing exercise.  She elects to stay on fosamax.  Trying to stay active. No chest pain.  No sob.  No acid reflux.  No abdominal pain.  Bowels moving.  Does report some left hip pain.  Persistent intermittent issue.  Desires no further w/up or evaluation at this time.  Will notify me if desires further evaluation.      ROS: See pertinent positives and negatives per HPI.  Past Medical History:  Diagnosis Date  . Colon cancer ([place])   . History of shingles    twice  . Hypercholesteremia   . Hypertension   . Left carotid bruit   . Nephrolithiasis   . Neuropathy   .  Personal history of chemotherapy 2014   Colon  . Personal history of radiation therapy 2014   Colon  . Urine incontinence 03/06/2013   h/o    Past Surgical History:  Procedure Laterality Date  . APPENDECTOMY  1979   colorectal  . CESAREAN SECTION    . COLONOSCOPY WITH PROPOFOL N/A 03/21/2015   Procedure: COLONOSCOPY WITH PROPOFOL;  Surgeon: Hulen Luster, MD;  Location: [facility] ENDOSCOPY;  Service: Gastroenterology;  Laterality: N/A;  . COLOSTOMY    . surgery for colorectal cancer    . VENTRAL HERNIA REPAIR Right [id number]    Family History  Problem Relation Age of Onset  . Hypertension Mother   . Hypertension Father   . Breast cancer Paternal Aunt     SOCIAL HX: reviewed.    Current Outpatient Medications:  .  alendronate (FOSAMAX) 70 MG tablet, TAKE 1 TABLET BY MOUTH ONCE A WEEK *TAKE  WITH  A  FULL  GLASS  OF  WATER  ON  AN  EMPTY  STOMACH*, Disp: 4 tablet, Rfl: 11 .  Alpha-Lipoic Acid 100 MG CAPS, Take 1 capsule by mouth daily. , Disp: , Rfl:  .  aspirin EC 81 MG tablet, Take 81 mg by mouth daily., Disp: , Rfl:  .  B Complex-C (SUPER B COMPLEX PO), Take 1 tablet by mouth daily., Disp: , Rfl:  .  Biotin 1000 MCG tablet, Take 1,000 mcg by mouth 2 (two) times daily. , Disp: , Rfl:  .  calcium  citrate-vitamin D (CITRACAL+D) 315-200 MG-UNIT per tablet, Take 1 tablet by mouth 2 (two) times daily., Disp: , Rfl:  .  Cholecalciferol (VITAMIN D3) 5000 units CAPS, Take 1 capsule by mouth daily., Disp: , Rfl:  .  clobetasol ointment (TEMOVATE) 0.05 %, Apply to affected area every night for 12 weeks, Disp: 30 g, Rfl: 3 .  glucosamine-chondroitin 500-400 MG tablet, Take 1 tablet by mouth 2 (two) times daily., Disp: , Rfl:  .  losartan-hydrochlorothiazide (HYZAAR) 50-12.5 MG tablet, Take 1 tablet by mouth daily., Disp: 90 tablet, Rfl: 3 .  lovastatin (MEVACOR) 10 MG tablet, Take 1 tablet (10 mg total) by mouth at bedtime., Disp: 90 tablet, Rfl: 1 .  Multiple Vitamin (MULTI-VITAMINS) TABS, Take  1 tablet by mouth daily. , Disp: , Rfl:  No current facility-administered medications for this visit.   Facility-Administered Medications Ordered in Other Visits:  .  sodium chloride flush (NS) 0.9 % injection 10 mL, 10 mL, Intravenous, PRN, Choksi, Janak, MD, 10 mL at 05/31/15 1040 .  sodium chloride flush (NS) 0.9 % injection 10 mL, 10 mL, Intravenous, PRN, Choksi, Janak, MD, 10 mL at 07/12/15 1018  EXAM:  GENERAL: alert. Sounds to be in no acute distress.  Answering questions appropriately.    PSYCH/NEURO: pleasant and cooperative, no obvious depression or anxiety, speech and thought processing grossly intact  ASSESSMENT AND PLAN:  Discussed the following assessment and plan:  Carotid artery disease ([place]) Saw [facility].  Carotid ultrasound left 1-39% and right no significant stenosis.  Recommended f/u in 12 months.  Last seen 06/2018.   History of rectal cancer History of rectal cancer.  S/p chemo and XRT and colectomy.  Last colonoscopy 03/2015.  Recommended f/u in 5 years.  Followed by Dr Grayland Ormond.  Last checked 07/2018.  Recommended f/u in one year.   Hypercholesterolemia On lovastatin.  Low cholesterol diet and exercise.  Follow lipid panel and liver function tests.    Osteopenia Discussed recent bone density results.  She is on fosamax and elects to remain on fosamax.  Continue calcium, vitamin D and weight bearing exercise.  Follow.     I discussed the assessment and treatment plan with the patient. The patient was provided an opportunity to ask questions and all were answered. The patient agreed with the plan and demonstrated an understanding of the instructions.   The patient was advised to call back or seek an in-person evaluation if the symptoms worsen or if the condition fails to improve as anticipated.  I provided 17 minutes of non-face-to-face time during this encounter.   Einar Pheasant, MD

## 2018-12-25 ENCOUNTER — Encounter: Payer: Self-pay | Admitting: Internal Medicine

## 2018-12-25 NOTE — Assessment & Plan Note (Addendum)
Saw [facility].  Carotid ultrasound left 1-39% and right no significant stenosis.  Recommended f/u in 12 months.  Last seen 06/2018.

## 2018-12-25 NOTE — Assessment & Plan Note (Signed)
On lovastatin.  Low cholesterol diet and exercise.  Follow lipid panel and liver function tests.   

## 2018-12-25 NOTE — Assessment & Plan Note (Signed)
History of rectal cancer.  S/p chemo and XRT and colectomy.  Last colonoscopy 03/2015.  Recommended f/u in 5 years.  Followed by Dr Grayland Ormond.  Last checked 07/2018.  Recommended f/u in one year.

## 2018-12-25 NOTE — Assessment & Plan Note (Signed)
Discussed recent bone density results.  She is on fosamax and elects to remain on fosamax.  Continue calcium, vitamin D and weight bearing exercise.  Follow.

## 2019-01-07 DIAGNOSIS — Z85038 Personal history of other malignant neoplasm of large intestine: Secondary | ICD-10-CM | POA: Diagnosis not present

## 2019-01-07 DIAGNOSIS — Z933 Colostomy status: Secondary | ICD-10-CM | POA: Diagnosis not present

## 2019-01-16 ENCOUNTER — Other Ambulatory Visit: Payer: Self-pay

## 2019-01-16 ENCOUNTER — Telehealth: Payer: Self-pay | Admitting: Internal Medicine

## 2019-01-16 MED ORDER — LOSARTAN POTASSIUM-HCTZ 50-12.5 MG PO TABS: 1.0000 | ORAL_TABLET | Freq: Every day | ORAL | 3 refills | Status: DC

## 2019-01-16 NOTE — Telephone Encounter (Signed)
Pt said she has 5 pills left of losartan. She said she already called [facility].

## 2019-01-16 NOTE — Telephone Encounter (Signed)
Medication refilled. Pt aware

## 2019-02-04 ENCOUNTER — Telehealth: Payer: Self-pay | Admitting: Internal Medicine

## 2019-02-04 NOTE — Telephone Encounter (Signed)
LMTCB

## 2019-02-04 NOTE — Telephone Encounter (Signed)
Pt wants to know if she should take the covid vaccine? Please advise and Thank you!  Call pt @ [phone] leave vm

## 2019-02-06 NOTE — Telephone Encounter (Signed)
Spoke with pt, discussed COVID vaccine. Advised she could have shot as long as no allergic reaction to any other vaccine or any allergy to any of the ingredients in the shot. Pt is going to follow local news for update.

## 2019-02-10 ENCOUNTER — Telehealth: Payer: Self-pay | Admitting: Internal Medicine

## 2019-02-10 ENCOUNTER — Other Ambulatory Visit: Payer: Self-pay

## 2019-02-10 MED ORDER — LOVASTATIN 10 MG PO TABS: 10.0000 mg | ORAL_TABLET | Freq: Every day | ORAL | 1 refills | Status: DC

## 2019-02-10 NOTE — Telephone Encounter (Signed)
Rx sent in

## 2019-02-10 NOTE — Telephone Encounter (Signed)
Pt called needing a refill for lovastatin (MEVACOR) 10 MG tablet.  Pharmacy is [id number] - [place], [place] - [address] AT [address]  Call pt @ [phone].

## 2019-02-20 ENCOUNTER — Ambulatory Visit: Payer: Medicare Other | Attending: Internal Medicine

## 2019-02-20 DIAGNOSIS — Z23 Encounter for immunization: Secondary | ICD-10-CM

## 2019-02-20 NOTE — Progress Notes (Signed)
   Covid-19 Vaccination Clinic  Name:  [name]    MRN: [id number] DOB: April 24, 1935  02/20/2019  [name] was observed post Covid-19 immunization for 15 minutes without incidence. She was provided with Vaccine Information Sheet and instruction to access the V-Safe system.   [name] was instructed to call 911 with any severe reactions post vaccine: Marland Kitchen Difficulty breathing  . Swelling of your face and throat  . A fast heartbeat  . A bad rash all over your body  . Dizziness and weakness    Immunizations Administered    Name Date Dose VIS Date Route   Pfizer COVID-19 Vaccine 02/20/2019  2:58 PM 0.3 mL 01/09/2019 Intramuscular   Manufacturer: [place]   Lot: [id number]   [place]: [id number]

## 2019-03-10 ENCOUNTER — Ambulatory Visit: Payer: Medicare Other | Attending: Internal Medicine

## 2019-03-10 DIAGNOSIS — Z23 Encounter for immunization: Secondary | ICD-10-CM | POA: Insufficient documentation

## 2019-03-10 NOTE — Progress Notes (Signed)
   Covid-19 Vaccination Clinic  Name:  Christina Mathis    MRN: [id number] DOB: [birth year]  03/10/2019  [name] was observed post Covid-19 immunization for 15 minutes without incidence. She was provided with Vaccine Information Sheet and instruction to access the V-Safe system.   [name] was instructed to call 911 with any severe reactions post vaccine: Marland Kitchen Difficulty breathing  . Swelling of your face and throat  . A fast heartbeat  . A bad rash all over your body  . Dizziness and weakness    Immunizations Administered    Name Date Dose VIS Date Route   Pfizer COVID-19 Vaccine 03/10/2019  9:42 AM 0.3 mL 01/09/2019 Intramuscular   Manufacturer: [place]   Lot: [id number]   [place]: [id number]

## 2019-03-18 ENCOUNTER — Ambulatory Visit (INDEPENDENT_AMBULATORY_CARE_PROVIDER_SITE_OTHER): Payer: Medicare Other | Admitting: Internal Medicine

## 2019-03-18 ENCOUNTER — Other Ambulatory Visit: Payer: Self-pay

## 2019-03-18 ENCOUNTER — Ambulatory Visit (INDEPENDENT_AMBULATORY_CARE_PROVIDER_SITE_OTHER): Payer: Medicare Other

## 2019-03-18 VITALS — Ht 63.0 in | Wt 138.0 lb

## 2019-03-18 DIAGNOSIS — I6523 Occlusion and stenosis of bilateral carotid arteries: Secondary | ICD-10-CM | POA: Diagnosis not present

## 2019-03-18 DIAGNOSIS — R739 Hyperglycemia, unspecified: Secondary | ICD-10-CM

## 2019-03-18 DIAGNOSIS — E7800 Pure hypercholesterolemia, unspecified: Secondary | ICD-10-CM

## 2019-03-18 DIAGNOSIS — Z85048 Personal history of other malignant neoplasm of rectum, rectosigmoid junction, and anus: Secondary | ICD-10-CM

## 2019-03-18 DIAGNOSIS — Z0000 Encounter for general adult medical examination without abnormal findings: Secondary | ICD-10-CM

## 2019-03-18 DIAGNOSIS — Z933 Colostomy status: Secondary | ICD-10-CM

## 2019-03-18 NOTE — Progress Notes (Addendum)
Subjective:   Christina Mathis is a 84 y.o. female who presents for Medicare Annual (Subsequent) preventive examination.  Review of Systems:  No ROS.  Medicare Wellness Virtual Visit.  Visual/audio telehealth visit, UTA vital signs.   Ht/Wt provided. See social history for additional risk factors.  Cardiac Risk Factors include: advanced age (>52men, >31 women)     Objective:     Vitals: Ht 5\' 3"  (1.6 m)   Wt 138 lb (62.6 kg)   BMI 24.45 kg/m   Body mass index is 24.45 kg/m.  Advanced Directives 03/18/2019 08/13/2018 03/14/2018 08/14/2017 03/08/2017 07/06/2016 03/07/2016  Does Patient Have a Medical Advance Directive? Yes No No No No No No  Type of Advance Directive [place]  Does patient want to make changes to medical advance directive? No - Patient declined - - - - - -  Copy of [place] in Chart? No - copy requested - - - - - -  Would patient like information on creating a medical advance directive? - No - Patient declined No - Patient declined No - Patient declined Yes (MAU/Ambulatory/Procedural Areas - Information given) - Yes (MAU/Ambulatory/Procedural Areas - Information given)    Tobacco Social History   Tobacco Use  Smoking Status Never Smoker  Smokeless Tobacco Never Used     Counseling given: Not Answered   Clinical Intake:  Pre-visit preparation completed: Yes        Diabetes: No  How often do you need to have someone help you when you read instructions, pamphlets, or other written materials from your doctor or pharmacy?: 1 - Never  Interpreter Needed?: No     Past Medical History:  Diagnosis Date  . Colon cancer ([place])   . History of shingles    twice  . Hypercholesteremia   . Hypertension   . Left carotid bruit   . Nephrolithiasis   . Neuropathy   . Personal history of chemotherapy 2014   Colon  . Personal history of radiation therapy 2014   Colon  . Urine incontinence 03/06/2013   h/o    Past Surgical History:  Procedure Laterality Date  . APPENDECTOMY  1979   colorectal  . CESAREAN SECTION    . COLONOSCOPY WITH PROPOFOL N/A 03/21/2015   Procedure: COLONOSCOPY WITH PROPOFOL;  Surgeon: Hulen Luster, MD;  Location: [facility] ENDOSCOPY;  Service: Gastroenterology;  Laterality: N/A;  . COLOSTOMY    . surgery for colorectal cancer    . VENTRAL HERNIA REPAIR Right [id number]   Family History  Problem Relation Age of Onset  . Hypertension Mother   . Hypertension Father   . Breast cancer Paternal Aunt    Social History   Socioeconomic History  . Marital status: Widowed    Spouse name: Not on file  . Number of children: Not on file  . Years of education: Not on file  . Highest education level: Not on file  Occupational History  . Not on file  Tobacco Use  . Smoking status: Never Smoker  . Smokeless tobacco: Never Used  Substance and Sexual Activity  . Alcohol use: No    Alcohol/week: 0.0 standard drinks  . Drug use: No  . Sexual activity: Not Currently  Other Topics Concern  . Not on file  Social History Narrative  . Not on file   Social Determinants of Health   Financial Resource Strain:   . Difficulty of Paying Living Expenses: Not on  file  Food Insecurity:   . Worried About Charity fundraiser in the Last Year: Not on file  . Ran Out of Food in the Last Year: Not on file  Transportation Needs:   . Lack of Transportation (Medical): Not on file  . Lack of Transportation (Non-Medical): Not on file  Physical Activity:   . Days of Exercise per Week: Not on file  . Minutes of Exercise per Session: Not on file  Stress:   . Feeling of Stress : Not on file  Social Connections:   . Frequency of Communication with Friends and Family: Not on file  . Frequency of Social Gatherings with Friends and Family: Not on file  . Attends Religious Services: Not on file  . Active Member of Clubs or Organizations: Not on file  . Attends Archivist Meetings: Not on  file  . Marital Status: Not on file    Outpatient Encounter Medications as of 03/18/2019  Medication Sig  . alendronate (FOSAMAX) 70 MG tablet TAKE 1 TABLET BY MOUTH ONCE A WEEK *TAKE  WITH  A  FULL  GLASS  OF  WATER  ON  AN  EMPTY  STOMACH*  . Alpha-Lipoic Acid 100 MG CAPS Take 1 capsule by mouth daily.   Marland Kitchen aspirin EC 81 MG tablet Take 81 mg by mouth daily.  . B Complex-C (SUPER B COMPLEX PO) Take 1 tablet by mouth daily.  . Biotin 1000 MCG tablet Take 1,000 mcg by mouth 2 (two) times daily.   . calcium citrate-vitamin D (CITRACAL+D) 315-200 MG-UNIT per tablet Take 1 tablet by mouth 2 (two) times daily.  . Cholecalciferol (VITAMIN D3) 5000 units CAPS Take 1 capsule by mouth daily.  Marland Kitchen glucosamine-chondroitin 500-400 MG tablet Take 1 tablet by mouth 2 (two) times daily.  Marland Kitchen losartan-hydrochlorothiazide (HYZAAR) 50-12.5 MG tablet Take 1 tablet by mouth daily.  Marland Kitchen lovastatin (MEVACOR) 10 MG tablet Take 1 tablet (10 mg total) by mouth at bedtime.  . Multiple Vitamin (MULTI-VITAMINS) TABS Take 1 tablet by mouth daily.   . [DISCONTINUED] clobetasol ointment (TEMOVATE) 0.05 % Apply to affected area every night for 12 weeks   Facility-Administered Encounter Medications as of 03/18/2019  Medication  . sodium chloride flush (NS) 0.9 % injection 10 mL  . sodium chloride flush (NS) 0.9 % injection 10 mL    Activities of Daily Living In your present state of health, do you have any difficulty performing the following activities: 03/18/2019  Hearing? N  Vision? N  Difficulty concentrating or making decisions? N  Walking or climbing stairs? N  Dressing or bathing? N  Doing errands, shopping? N  Preparing Food and eating ? N  Using the Toilet? N  In the past six months, have you accidently leaked urine? N  Do you have problems with loss of bowel control? N  Managing your Medications? N  Managing your Finances? N  Housekeeping or managing your Housekeeping? N  Some recent data might be hidden     Patient Care Team: Einar Pheasant, MD as PCP - General (Internal Medicine) Regal, Tamala Fothergill, DPM as Consulting Physician (Podiatry) Lloyd Huger, MD as Consulting Physician (Oncology)    Assessment:   This is a routine wellness examination for Christina Mathis.  Nurse connected with patient 03/18/19 at  8:30 AM EST by a telephone enabled telemedicine application and verified that I am speaking with the correct person using two identifiers. Patient stated full name and DOB. Patient gave permission to continue  with virtual visit. Patient's location was at home and Nurse's location was at [place] office.   Patient is alert and oriented x3. Patient denies difficulty focusing or concentrating. Patient likes to knit and word search for brain stimulation.   Health Maintenance Due: -Tdap vaccine- discussed; to be completed with doctor in visit or local pharmacy.   See completed HM at the end of note.   Eye: Visual acuity not assessed. Virtual visit. Followed by their ophthalmologist.  Dental: Visits every 6 months.    Hearing: Demonstrates normal hearing during visit.  Safety:  Patient feels safe at home- yes Patient does have smoke detectors at home- yes Patient does wear sunscreen or protective clothing when in direct sunlight - yes Patient does wear seat belt when in a moving vehicle - yes Patient drives- yes Adequate lighting in walkways free from debris- yes Grab bars and handrails used as appropriate- yes Ambulates with an assistive device- no Cell phone on person when ambulating outside of the home- yes  Social: Alcohol intake - no  Smoking history- never   Smokers in home? none Illicit drug use? none  Medication: Taking as directed and without issues.  Pill box in use -yes  Self managed - yes   Covid-19: Precautions and sickness symptoms discussed. Wears mask, social distancing, hand hygiene as appropriate.   Activities of Daily Living Patient denies needing  assistance with: household chores, feeding themselves, getting from bed to chair, getting to the toilet, bathing/showering, dressing, managing money, or preparing meals.   Discussed the importance of a healthy diet, water intake and the benefits of aerobic exercise.   Physical activity- walking 1 mile daily  Diet:  Regular. Ensure daily. Water: 64 ounces Caffeine: none  Other Providers Patient Care Team: Einar Pheasant, MD as PCP - General (Internal Medicine) Regal, Tamala Fothergill, DPM as Consulting Physician (Podiatry) Grayland Ormond Kathlene November, MD as Consulting Physician (Oncology) Exercise Activities and Dietary recommendations Current Exercise Habits: Home exercise routine, Type of exercise: walking, Frequency (Times/Week): 7, Intensity: Mild  Goals      Patient Stated   . Increase physical activity (pt-stated)     Walk up to 2 miles daily       Fall Risk Fall Risk  03/18/2019 03/14/2018 03/08/2017 03/07/2016 10/18/2015  Falls in the past year? 0 0 No Yes Yes  Number falls in past yr: - - - 1 1  Injury with Fall? - - - No No  Follow up Falls evaluation completed - - Falls prevention discussed -   Timed Get Up and Go performed: no, virtual visit  Depression Screen PHQ 2/9 Scores 03/18/2019 03/14/2018 03/08/2017 03/07/2016  PHQ - 2 Score 0 0 0 0     Cognitive Function MMSE - Mini Mental State Exam 03/08/2017  Orientation to time 5  Orientation to Place 5  Registration 3  Attention/ Calculation 5  Recall 1  Language- name 2 objects 2  Language- repeat 1  Language- follow 3 step command 3  Language- read & follow direction 1  Write a sentence 1  Copy design 1  Total score 28     6CIT Screen 03/18/2019 03/14/2018 03/07/2016  What Year? 0 points 0 points 0 points  What month? 0 points 0 points 0 points  What time? 0 points 0 points 0 points  Count back from 20 0 points 0 points 0 points  Months in reverse 0 points 0 points 0 points  Repeat phrase 0 points 0 points 0 points  Total  Score 0 0 0    Immunization History  Administered Date(s) Administered  . Fluad Quad(high Dose 65+) 11/28/2018  . Influenza, High Dose Seasonal PF 10/18/2015, 10/22/2016, 11/01/2017  . Influenza,inj,Quad PF,6+ Mos 10/12/2014  . Influenza-Unspecified 11/30/2013  . PFIZER SARS-COV-2 Vaccination 02/20/2019, 03/10/2019  . Pneumococcal Conjugate-13 10/22/2016  . Pneumococcal Polysaccharide-23 01/30/2004   Screening Tests Health Maintenance  Topic Date Due  . HEMOGLOBIN A1C  01/22/2019  . TETANUS/TDAP  03/17/2020 (Originally 09/18/1954)  . FOOT EXAM  09/13/2019  . OPHTHALMOLOGY EXAM  12/11/2019  . MAMMOGRAM  12/17/2019  . COLONOSCOPY  03/20/2020  . INFLUENZA VACCINE  Completed  . DEXA SCAN  Completed  . PNA vac Low Risk Adult  Completed      Plan:   Keep all routine maintenance appointments.   Follow up with your doctor today @ 9:00  Medicare Attestation I have personally reviewed: The patient's medical and social history Their use of alcohol, tobacco or illicit drugs Their current medications and supplements The patient's functional ability including ADLs,fall risks, home safety risks, cognitive, and hearing and visual impairment Diet and physical activities Evidence for depression   I have reviewed and discussed with patient certain preventive protocols, quality metrics, and best practice recommendations.     Varney Biles, LPN  [id number]   Reviewed above information.  Agree with assessment and plan.    Dr Nicki Reaper

## 2019-03-18 NOTE — Progress Notes (Signed)
[name], female   DOB: [birth year], 84 y.o.   MRN: [id number]   Virtual Visit via telephone Note  This visit type was conducted due to national recommendations for restrictions regarding the COVID-19 pandemic (e.g. social distancing).  This format is felt to be most appropriate for this patient at this time.  All issues noted in this document were discussed and addressed.  No physical exam was performed (except for noted visual exam findings with Video Visits).   I connected with Christina Mathis by telephone and verified that I am speaking with the correct person using two identifiers. Location patient: home Location provider: work  Persons participating in the telephone visit: patient, provider  I discussed the limitations, risks, security and privacy concerns of performing an evaluation and management service by telephone and the availability of in person appointments. The patient expressed understanding and agreed to proceed.   Reason for visit: scheduled follow up.   HPI: She reports she is doing well.  Feels good.  Stays active.  No chest pain or sob.  No acid reflux.  No abdominal pain.  Ostomy working well.  Hip pain - not a significant issue now.  Has seen podiatry - capsulitis.  States her great toe is crossing over and will put pressure on her third toe.  He gave her a divider.  She is using cotton balls.  Plans to discuss with him if continues to be an issue.  Tries to walk one mile per day when the weather allows.  Hip nor foot - limit her activity.  Wears compression hose.  Overall feels good.     ROS: See pertinent positives and negatives per HPI.  Past Medical History:  Diagnosis Date  . Colon cancer ([employer])   . History of shingles    twice  . Hypercholesteremia   . Hypertension   . Left carotid bruit   . Nephrolithiasis   . Neuropathy   . Personal history of chemotherapy 2014   Colon  . Personal history of radiation therapy 2014   Colon  . Urine  incontinence 03/06/2013   h/o    Past Surgical History:  Procedure Laterality Date  . APPENDECTOMY  1979   colorectal  . CESAREAN SECTION    . COLONOSCOPY WITH PROPOFOL N/A 03/21/2015   Procedure: COLONOSCOPY WITH PROPOFOL;  Surgeon: Hulen Luster, MD;  [facility] ENDOSCOPY;  Service: Gastroenterology;  Laterality: N/A;  . COLOSTOMY    . surgery for colorectal cancer    . VENTRAL HERNIA REPAIR Right [phone]    Family History  Problem Relation Age of Onset  . Hypertension Mother   . Hypertension Father   . Breast cancer Paternal Aunt     SOCIAL HX: reviewed.    Current Outpatient Medications:  .  alendronate (FOSAMAX) 70 MG tablet, TAKE 1 TABLET BY MOUTH ONCE A WEEK *TAKE  WITH  A  FULL  GLASS  OF  WATER  ON  AN  EMPTY  STOMACH*, Disp: 4 tablet, Rfl: 11 .  Alpha-Lipoic Acid 100 MG CAPS, Take 1 capsule by mouth daily. , Disp: , Rfl:  .  aspirin EC 81 MG tablet, Take 81 mg by mouth daily., Disp: , Rfl:  .  B Complex-C (SUPER B COMPLEX PO), Take 1 tablet by mouth daily., Disp: , Rfl:  .  Biotin 1000 MCG tablet, Take 1,000 mcg by mouth 2 (two) times daily. , Disp: , Rfl:  .  calcium citrate-vitamin D (CITRACAL+D) 315-200 MG-UNIT per  tablet, Take 1 tablet by mouth 2 (two) times daily., Disp: , Rfl:  .  Cholecalciferol (VITAMIN D3) 5000 units CAPS, Take 1 capsule by mouth daily., Disp: , Rfl:  .  glucosamine-chondroitin 500-400 MG tablet, Take 1 tablet by mouth 2 (two) times daily., Disp: , Rfl:  .  losartan-hydrochlorothiazide (HYZAAR) 50-12.5 MG tablet, Take 1 tablet by mouth daily., Disp: 90 tablet, Rfl: 3 .  lovastatin (MEVACOR) 10 MG tablet, Take 1 tablet (10 mg total) by mouth at bedtime., Disp: 90 tablet, Rfl: 1 .  Multiple Vitamin (MULTI-VITAMINS) TABS, Take 1 tablet by mouth daily. , Disp: , Rfl:  No current facility-administered medications for this visit.  Facility-Administered Medications Ordered in Other Visits:  .  sodium chloride flush (NS) 0.9 % injection 10 mL, 10  mL, Intravenous, PRN, Choksi, Janak, MD, 10 mL at 05/31/15 1040 .  sodium chloride flush (NS) 0.9 % injection 10 mL, 10 mL, Intravenous, PRN, Choksi, Janak, MD, 10 mL at 07/12/15 1018  EXAM:  GENERAL: alert.  Sounds to be in no acute distress.  Answering questions appropriately.   PSYCH/NEURO: pleasant and cooperative, no obvious depression or anxiety, speech and thought processing grossly intact  ASSESSMENT AND PLAN:  Discussed the following assessment and plan:  Colostomy status ([place]) Ostomy working well.    Carotid artery disease ([place]) Saw [facility].  Carotid ultrasound - left 1-39% and right no significant stenosis.  Recommended f/u in 12 months.  Last seen 06/2018.   History of rectal cancer S/p chemo and XRT and colectomy.  Last colonoscopy 03/2015. Recommended f/u in 5 years.  Followed by Dr Grayland Ormond.  Last checked 07/2018.  Recommended f/u in one year.   Hypercholesterolemia On lovastatin.  Low cholesterol diet and exercise.  Follow lipid panel and liver function tests.    Hyperglycemia Low carb diet and exercise.  Follow met b and a1c.     I discussed the assessment and treatment plan with the patient. The patient was provided an opportunity to ask questions and all were answered. The patient agreed with the plan and demonstrated an understanding of the instructions.   The patient was advised to call back or seek an in-person evaluation if the symptoms worsen or if the condition fails to improve as anticipated.  I provided 15 minutes of non-face-to-face time during this encounter.   Einar Pheasant, MD

## 2019-03-18 NOTE — Patient Instructions (Addendum)
  Ms. Christina Mathis , Thank you for taking time to come for your Medicare Wellness Visit. I appreciate your ongoing commitment to your health goals. Please review the following plan we discussed and let me know if I can assist you in the future.   These are the goals we discussed: Goals      Patient Stated   . Increase physical activity (pt-stated)     Walk up to 2 miles daily       This is a list of the screening recommended for you and due dates:  Health Maintenance  Topic Date Due  . Hemoglobin A1C  01/22/2019  . Tetanus Vaccine  03/17/2020*  . Complete foot exam   09/13/2019  . Eye exam for diabetics  12/11/2019  . Mammogram  12/17/2019  . Colon Cancer Screening  03/20/2020  . Flu Shot  Completed  . DEXA scan (bone density measurement)  Completed  . Pneumonia vaccines  Completed  *Topic was postponed. The date shown is not the original due date.

## 2019-03-20 ENCOUNTER — Encounter: Payer: Self-pay | Admitting: Internal Medicine

## 2019-03-20 NOTE — Assessment & Plan Note (Signed)
Ostomy working well.  

## 2019-03-20 NOTE — Assessment & Plan Note (Signed)
On lovastatin.  Low cholesterol diet and exercise.  Follow lipid panel and liver function tests.   

## 2019-03-20 NOTE — Assessment & Plan Note (Signed)
Saw [facility].  Carotid ultrasound - left 1-39% and right no significant stenosis.  Recommended f/u in 12 months.  Last seen 06/2018.

## 2019-03-20 NOTE — Assessment & Plan Note (Signed)
S/p chemo and XRT and colectomy.  Last colonoscopy 03/2015.  Recommended f/u in 5 years.  Followed by Dr Finnegan.  Last checked 07/2018.  Recommended f/u in one year.   °

## 2019-03-20 NOTE — Assessment & Plan Note (Signed)
Low carb diet and exercise.  Follow met b and a1c.  

## 2019-04-01 ENCOUNTER — Other Ambulatory Visit (INDEPENDENT_AMBULATORY_CARE_PROVIDER_SITE_OTHER): Payer: Medicare Other

## 2019-04-01 ENCOUNTER — Other Ambulatory Visit: Payer: Self-pay

## 2019-04-01 DIAGNOSIS — R739 Hyperglycemia, unspecified: Secondary | ICD-10-CM

## 2019-04-01 DIAGNOSIS — D7589 Other specified diseases of blood and blood-forming organs: Secondary | ICD-10-CM

## 2019-04-01 DIAGNOSIS — E7800 Pure hypercholesterolemia, unspecified: Secondary | ICD-10-CM | POA: Diagnosis not present

## 2019-04-01 LAB — CBC WITH DIFFERENTIAL/PLATELET
Basophils Absolute: 0.1 10*3/uL (ref 0.0–0.1)
Basophils Relative: 2.1 % (ref 0.0–3.0)
Eosinophils Absolute: 0.1 10*3/uL (ref 0.0–0.7)
Eosinophils Relative: 1.7 % (ref 0.0–5.0)
HCT: 37.1 % (ref 36.0–46.0)
Hemoglobin: 12.7 g/dL (ref 12.0–15.0)
Lymphocytes Relative: 15.9 % (ref 12.0–46.0)
Lymphs Abs: 1 10*3/uL (ref 0.7–4.0)
MCHC: 34.3 g/dL (ref 30.0–36.0)
MCV: 104 fl — ABNORMAL HIGH (ref 78.0–100.0)
Monocytes Absolute: 0.6 10*3/uL (ref 0.1–1.0)
Monocytes Relative: 9.4 % (ref 3.0–12.0)
Neutro Abs: 4.3 10*3/uL (ref 1.4–7.7)
Neutrophils Relative %: 70.9 % (ref 43.0–77.0)
Platelets: 267 10*3/uL (ref 150.0–400.0)
RBC: 3.56 Mil/uL — ABNORMAL LOW (ref 3.87–5.11)
RDW: 13.6 % (ref 11.5–15.5)
WBC: 6 10*3/uL (ref 4.0–10.5)

## 2019-04-01 LAB — BASIC METABOLIC PANEL
BUN: 30 mg/dL — ABNORMAL HIGH (ref 6–23)
CO2: 32 mEq/L (ref 19–32)
Calcium: 9.6 mg/dL (ref 8.4–10.5)
Chloride: 103 mEq/L (ref 96–112)
Creatinine, Ser: 1 mg/dL (ref 0.40–1.20)
GFR: 53.14 mL/min — ABNORMAL LOW (ref >60.00)
Glucose, Bld: 125 mg/dL — ABNORMAL HIGH (ref 70–99)
Potassium: 4.9 mEq/L (ref 3.5–5.1)
Sodium: 142 mEq/L (ref 135–145)

## 2019-04-01 LAB — LIPID PANEL
Cholesterol: 100 mg/dL (ref 0–200)
HDL: 37.4 mg/dL — ABNORMAL LOW (ref >39.00)
LDL Cholesterol: 48 mg/dL (ref 0–99)
NonHDL: 62.96
Total CHOL/HDL Ratio: 3
Triglycerides: 77 mg/dL (ref 0.0–149.0)
VLDL: 15.4 mg/dL (ref 0.0–40.0)

## 2019-04-01 LAB — HEPATIC FUNCTION PANEL
ALT: 20 U/L (ref 0–35)
AST: 21 U/L (ref 0–37)
Albumin: 3.8 g/dL (ref 3.5–5.2)
Alkaline Phosphatase: 49 U/L (ref 39–117)
Bilirubin, Direct: 0.2 mg/dL (ref 0.0–0.3)
Total Bilirubin: 0.8 mg/dL (ref 0.2–1.2)
Total Protein: 6.3 g/dL (ref 6.0–8.3)

## 2019-04-01 LAB — VITAMIN B12: Vitamin B-12: 594 pg/mL (ref 211–911)

## 2019-04-01 LAB — HEMOGLOBIN A1C: Hgb A1c MFr Bld: 5.5 % (ref 4.6–6.5)

## 2019-04-02 LAB — FOLATE RBC: RBC Folate: 888 ng/mL RBC (ref >280)

## 2019-04-18 ENCOUNTER — Encounter: Payer: Self-pay | Admitting: Internal Medicine

## 2019-04-18 ENCOUNTER — Telehealth: Payer: Self-pay | Admitting: Internal Medicine

## 2019-04-18 DIAGNOSIS — D7589 Other specified diseases of blood and blood-forming organs: Secondary | ICD-10-CM | POA: Insufficient documentation

## 2019-04-18 NOTE — Telephone Encounter (Signed)
-----   Message from Cammie Sickle, MD sent at 04/04/2019 11:53 PM EST ----- Regarding: RE: question Just reviewing pt labs; elevated MCV- at her age could be a precursor of low grade MDS/ myelodysplastic syndrome. However, since she does not have anemia- I think monitoring for now every 6 months or so is reasonable. I agree with you, and I would hold off bone marrow at this time as this would not change her current management.  Thanks, GB ----- Message ----- From: Einar Pheasant, MD Sent: 04/04/2019  11:39 PM EST To: Cammie Sickle, MD Subject: question                                       Ms Leggio has had an elevated MCV on previous labs.  Most recent lab with MCV 104.  (increasing).  Recent B12 and rbc folate levels wnl.  No history of increased alcohol intake.  Hemoglobin and liver function tests are wnl. Is there any need to do any further work up at this time or is this something I can just follow.  I appreciate your help.    Thank you Einar Pheasant

## 2019-05-29 DIAGNOSIS — Z85038 Personal history of other malignant neoplasm of large intestine: Secondary | ICD-10-CM | POA: Diagnosis not present

## 2019-05-29 DIAGNOSIS — Z933 Colostomy status: Secondary | ICD-10-CM | POA: Diagnosis not present

## 2019-07-01 DIAGNOSIS — Z0120 Encounter for dental examination and cleaning without abnormal findings: Secondary | ICD-10-CM | POA: Diagnosis not present

## 2019-07-03 DIAGNOSIS — D2261 Melanocytic nevi of right upper limb, including shoulder: Secondary | ICD-10-CM | POA: Diagnosis not present

## 2019-07-03 DIAGNOSIS — D225 Melanocytic nevi of trunk: Secondary | ICD-10-CM | POA: Diagnosis not present

## 2019-07-03 DIAGNOSIS — D2262 Melanocytic nevi of left upper limb, including shoulder: Secondary | ICD-10-CM | POA: Diagnosis not present

## 2019-07-03 DIAGNOSIS — L821 Other seborrheic keratosis: Secondary | ICD-10-CM | POA: Diagnosis not present

## 2019-07-16 ENCOUNTER — Ambulatory Visit (INDEPENDENT_AMBULATORY_CARE_PROVIDER_SITE_OTHER): Payer: Medicare Other

## 2019-07-16 ENCOUNTER — Other Ambulatory Visit: Payer: Self-pay

## 2019-07-16 ENCOUNTER — Encounter (INDEPENDENT_AMBULATORY_CARE_PROVIDER_SITE_OTHER): Payer: Self-pay | Admitting: Nurse Practitioner

## 2019-07-16 ENCOUNTER — Ambulatory Visit (INDEPENDENT_AMBULATORY_CARE_PROVIDER_SITE_OTHER): Payer: Medicare Other | Admitting: Nurse Practitioner

## 2019-07-16 VITALS — BP 136/73 | HR 64 | Ht 63.0 in | Wt 140.0 lb

## 2019-07-16 DIAGNOSIS — I771 Stricture of artery: Secondary | ICD-10-CM

## 2019-07-16 DIAGNOSIS — E7800 Pure hypercholesterolemia, unspecified: Secondary | ICD-10-CM | POA: Diagnosis not present

## 2019-07-16 DIAGNOSIS — I6523 Occlusion and stenosis of bilateral carotid arteries: Secondary | ICD-10-CM

## 2019-07-16 DIAGNOSIS — I890 Lymphedema, not elsewhere classified: Secondary | ICD-10-CM | POA: Diagnosis not present

## 2019-07-16 NOTE — Progress Notes (Signed)
Subjective:    [name], female    DOB: 08/24/1935, 84 y.o.   MRN: [id number] Chief Complaint  Patient presents with  . Follow-up    U/S Follow    The patient is seen for follow up evaluation of carotid stenosis. The carotid stenosis followed by ultrasound.   The patient denies amaurosis fugax. There is no recent history of TIA symptoms or focal motor deficits. There is no prior documented CVA.  The patient is taking enteric-coated aspirin 81 mg daily.  There is no history of migraine headaches. There is no history of seizures.  The patient denies any right upper extremity arm pain.  She has had good control of her lymphedema and is adhering well to conservative medical therapy including medical grade 1 compression stockings and elevation.  The patient has a history of coronary artery disease, no recent episodes of angina or shortness of breath. The patient denies PAD or claudication symptoms. There is a history of hyperlipidemia which is being treated with a statin.    Duplex ultrasound shows 1% stenosis in the left internal carotid no evidence of stenosis in the right internal carotid artery.  Retrograde flow notes in the right vertebral artery.  This is consistent with study done on 07/14/2018.   Review of Systems  Cardiovascular: Positive for leg swelling.  All other systems reviewed and are negative.      Objective:   Physical Exam Vitals reviewed.  Constitutional:      Appearance: Normal appearance.  HENT:     Head: Normocephalic.  Neck:     Vascular: No carotid bruit (Left).  Cardiovascular:     Rate and Rhythm: Normal rate and regular rhythm.     Pulses: Normal pulses.     Heart sounds: Normal heart sounds.  Pulmonary:     Effort: Pulmonary effort is normal.     Breath sounds: Normal breath sounds.  Musculoskeletal:     Right lower leg: Edema present.     Left lower leg: Edema present.  Neurological:     Mental Status: She is alert and  oriented to person, place, and time.  Psychiatric:        Mood and Affect: Mood normal.        Behavior: Behavior normal.        Thought Content: Thought content normal.        Judgment: Judgment normal.     BP 136/73   Pulse 64   Ht 5\' 3"  (1.6 m)   Wt 140 lb (63.5 kg)   BMI 24.80 kg/m   Past Medical History:  Diagnosis Date  . Colon cancer ([place])   . History of shingles    twice  . Hypercholesteremia   . Hypertension   . Left carotid bruit   . Nephrolithiasis   . Neuropathy   . Personal history of chemotherapy 2014   Colon  . Personal history of radiation therapy 2014   Colon  . Urine incontinence 03/06/2013   h/o    Social History   Socioeconomic History  . Marital status: Widowed    Spouse name: Not on file  . Number of children: Not on file  . Years of education: Not on file  . Highest education level: Not on file  Occupational History  . Not on file  Tobacco Use  . Smoking status: Never Smoker  . Smokeless tobacco: Never Used  Vaping Use  . Vaping Use: Never used  Substance and Sexual Activity  .  Alcohol use: No    Alcohol/week: 0.0 standard drinks  . Drug use: No  . Sexual activity: Not Currently  Other Topics Concern  . Not on file  Social History Narrative  . Not on file   Social Determinants of Health   Financial Resource Strain:   . Difficulty of Paying Living Expenses:   Food Insecurity:   . Worried About Charity fundraiser in the Last Year:   . Arboriculturist in the Last Year:   Transportation Needs:   . Film/video editor (Medical):   Marland Kitchen Lack of Transportation (Non-Medical):   Physical Activity:   . Days of Exercise per Week:   . Minutes of Exercise per Session:   Stress:   . Feeling of Stress :   Social Connections:   . Frequency of Communication with Friends and Family:   . Frequency of Social Gatherings with Friends and Family:   . Attends Religious Services:   . Active Member of Clubs or Organizations:   . Attends  Archivist Meetings:   Marland Kitchen Marital Status:   Intimate Partner Violence:   . Fear of Current or Ex-Partner:   . Emotionally Abused:   Marland Kitchen Physically Abused:   . Sexually Abused:     Past Surgical History:  Procedure Laterality Date  . APPENDECTOMY  1979   colorectal  . CESAREAN SECTION    . COLONOSCOPY WITH PROPOFOL N/A 03/21/2015   Procedure: COLONOSCOPY WITH PROPOFOL;  Surgeon: Hulen Luster, MD;  Location: [facility] ENDOSCOPY;  Service: Gastroenterology;  Laterality: N/A;  . COLOSTOMY    . surgery for colorectal cancer    . VENTRAL HERNIA REPAIR Right [phone]    Family History  Problem Relation Age of Onset  . Hypertension Mother   . Hypertension Father   . Breast cancer Paternal Aunt     No Known Allergies     Assessment & Plan:   1. Bilateral carotid artery stenosis Recommend:  Given the patient's asymptomatic subcritical stenosis no further invasive testing or surgery at this time.  Duplex ultrasound shows 1% stenosis in the left internal carotid no evidence of stenosis in the right internal carotid artery.  Retrograde flow notes in the right vertebral artery.    Continue antiplatelet therapy as prescribed Continue management of CAD, HTN and Hyperlipidemia Healthy heart diet,  encouraged exercise at least 4 times per week Follow up in 12 months with duplex ultrasound and physical exam   2. Subclavian arterial stenosis (H[facility]) Patient is relatively asymptomatic.  We will continue to monitor on an annual basis with carotid duplex.  Patient advised that if she begins to have numbness, tingling, discoloration or ulcer formation on her right upper extremity she should contact her office for evaluation.  3. Hypercholesterolemia Continue statin as ordered and reviewed, no changes at this time   4. Lymphedema No surgery or intervention at this point in time.  I have reviewed my discussion with the patient regarding venous insufficiency and why it causes symptoms. I have  discussed with the patient the chronic skin changes that accompany venous insufficiency and the long term sequela such as ulceration. Patient will contnue wearing graduated compression stockings on a daily basis, as this has provided excellent control of his edema. The patient will put the stockings on first thing in the morning and removing them in the evening. The patient is reminded not to sleep in the stockings.  In addition, behavioral modification including elevation during the day will  be initiated. Exercise is strongly encouraged.     Current Outpatient Medications on File Prior to Visit  Medication Sig Dispense Refill  . alendronate (FOSAMAX) 70 MG tablet TAKE 1 TABLET BY MOUTH ONCE A WEEK *TAKE  WITH  A  FULL  GLASS  OF  WATER  ON  AN  EMPTY  STOMACH* 4 tablet 11  . Alpha-Lipoic Acid 100 MG CAPS Take 1 capsule by mouth daily.     Marland Kitchen aspirin EC 81 MG tablet Take 81 mg by mouth daily.    . B Complex-C (SUPER B COMPLEX PO) Take 1 tablet by mouth daily.    . Biotin 1000 MCG tablet Take 1,000 mcg by mouth 2 (two) times daily.     . calcium citrate-vitamin D (CITRACAL+D) 315-200 MG-UNIT per tablet Take 1 tablet by mouth 2 (two) times daily.    . Cholecalciferol (VITAMIN D3) 5000 units CAPS Take 1 capsule by mouth daily.    Marland Kitchen glucosamine-chondroitin 500-400 MG tablet Take 1 tablet by mouth 2 (two) times daily.    Marland Kitchen losartan-hydrochlorothiazide (HYZAAR) 50-12.5 MG tablet Take 1 tablet by mouth daily. 90 tablet 3  . lovastatin (MEVACOR) 10 MG tablet Take 1 tablet (10 mg total) by mouth at bedtime. 90 tablet 1  . Multiple Vitamin (MULTI-VITAMINS) TABS Take 1 tablet by mouth daily.      Current Facility-Administered Medications on File Prior to Visit  Medication Dose Route Frequency Provider Last Rate Last Admin  . sodium chloride flush (NS) 0.9 % injection 10 mL  10 mL Intravenous PRN Choksi, Delorise Shiner, MD   10 mL at 05/31/15 1040  . sodium chloride flush (NS) 0.9 % injection 10 mL  10 mL  Intravenous PRN Forest Gleason, MD   10 mL at 07/12/15 1018    There are no Patient Instructions on file for this visit. No follow-ups on file.   Kris Hartmann, NP

## 2019-07-21 ENCOUNTER — Other Ambulatory Visit: Payer: Self-pay | Admitting: Internal Medicine

## 2019-07-24 ENCOUNTER — Telehealth: Payer: Self-pay | Admitting: *Deleted

## 2019-07-24 DIAGNOSIS — E7800 Pure hypercholesterolemia, unspecified: Secondary | ICD-10-CM

## 2019-07-24 DIAGNOSIS — R739 Hyperglycemia, unspecified: Secondary | ICD-10-CM

## 2019-07-24 NOTE — Telephone Encounter (Signed)
Please place future orders for lab appt.  

## 2019-07-25 NOTE — Telephone Encounter (Signed)
Order placed for f/u labs.  

## 2019-07-27 ENCOUNTER — Other Ambulatory Visit (INDEPENDENT_AMBULATORY_CARE_PROVIDER_SITE_OTHER): Payer: Medicare Other

## 2019-07-27 ENCOUNTER — Other Ambulatory Visit: Payer: Self-pay

## 2019-07-27 DIAGNOSIS — E7800 Pure hypercholesterolemia, unspecified: Secondary | ICD-10-CM

## 2019-07-27 DIAGNOSIS — R739 Hyperglycemia, unspecified: Secondary | ICD-10-CM | POA: Diagnosis not present

## 2019-07-27 LAB — LIPID PANEL
Cholesterol: 110 mg/dL (ref 0–200)
HDL: 34.4 mg/dL — ABNORMAL LOW (ref >39.00)
LDL Cholesterol: 55 mg/dL (ref 0–99)
NonHDL: 75.44
Total CHOL/HDL Ratio: 3
Triglycerides: 103 mg/dL (ref 0.0–149.0)
VLDL: 20.6 mg/dL (ref 0.0–40.0)

## 2019-07-27 LAB — TSH: TSH: 3.04 u[IU]/mL (ref 0.35–4.50)

## 2019-07-27 LAB — HEMOGLOBIN A1C: Hgb A1c MFr Bld: 5.8 % (ref 4.6–6.5)

## 2019-07-27 LAB — HEPATIC FUNCTION PANEL
ALT: 25 U/L (ref 0–35)
AST: 23 U/L (ref 0–37)
Albumin: 4 g/dL (ref 3.5–5.2)
Alkaline Phosphatase: 42 U/L (ref 39–117)
Bilirubin, Direct: 0.1 mg/dL (ref 0.0–0.3)
Total Bilirubin: 0.4 mg/dL (ref 0.2–1.2)
Total Protein: 6.6 g/dL (ref 6.0–8.3)

## 2019-07-27 LAB — BASIC METABOLIC PANEL
BUN: 32 mg/dL — ABNORMAL HIGH (ref 6–23)
CO2: 30 mEq/L (ref 19–32)
Calcium: 9.4 mg/dL (ref 8.4–10.5)
Chloride: 103 mEq/L (ref 96–112)
Creatinine, Ser: 0.99 mg/dL (ref 0.40–1.20)
GFR: 53.72 mL/min — ABNORMAL LOW (ref >60.00)
Glucose, Bld: 117 mg/dL — ABNORMAL HIGH (ref 70–99)
Potassium: 4.6 mEq/L (ref 3.5–5.1)
Sodium: 141 mEq/L (ref 135–145)

## 2019-07-29 ENCOUNTER — Ambulatory Visit: Payer: Medicare Other | Admitting: Internal Medicine

## 2019-07-29 ENCOUNTER — Other Ambulatory Visit: Payer: Self-pay

## 2019-07-29 VITALS — BP 132/62 | HR 72 | Temp 97.7°F | Resp 16 | Ht 63.0 in | Wt 142.0 lb

## 2019-07-29 DIAGNOSIS — Z85048 Personal history of other malignant neoplasm of rectum, rectosigmoid junction, and anus: Secondary | ICD-10-CM

## 2019-07-29 DIAGNOSIS — R739 Hyperglycemia, unspecified: Secondary | ICD-10-CM

## 2019-07-29 DIAGNOSIS — S98132A Complete traumatic amputation of one left lesser toe, initial encounter: Secondary | ICD-10-CM | POA: Insufficient documentation

## 2019-07-29 DIAGNOSIS — I771 Stricture of artery: Secondary | ICD-10-CM

## 2019-07-29 DIAGNOSIS — Z0000 Encounter for general adult medical examination without abnormal findings: Secondary | ICD-10-CM | POA: Diagnosis not present

## 2019-07-29 DIAGNOSIS — E7800 Pure hypercholesterolemia, unspecified: Secondary | ICD-10-CM

## 2019-07-29 DIAGNOSIS — I6523 Occlusion and stenosis of bilateral carotid arteries: Secondary | ICD-10-CM

## 2019-07-29 DIAGNOSIS — Z933 Colostomy status: Secondary | ICD-10-CM

## 2019-07-29 NOTE — Progress Notes (Signed)
[name], female   DOB: [birth year], 84 y.o.   MRN: [id number]   Subjective:    [name], female    DOB: September 25, 1935, 84 y.o.   MRN: [id number]  HPI This visit occurred during the SARS-CoV-2 public health emergency.  Safety protocols were in place, including screening questions prior to the visit, additional usage of staff PPE, and extensive cleaning of exam room while observing appropriate contact time as indicated for disinfecting solutions.  Patient here for her physical exam.  She reports she is doing relatively well.  Tries to stay active.  No chest pain or sob.  No acid reflux.  No abdominal pain.  Bowels moving.  No urine change reported.  Just evaluated by [facility] 07/16/19 - carotid artery stenosis.  Stable.  Recommend f/u in 12 months.  Her main concern is her toe.  Is s/p amputation of left toe.  Is concerned about the adjacent toe - bending/leaning.  Would like referral back to podiatry for reevaluation.  Reports ostomy working well.     Past Medical History:  Diagnosis Date  . Colon cancer ([place])   . History of shingles    twice  . Hypercholesteremia   . Hypertension   . Left carotid bruit   . Nephrolithiasis   . Neuropathy   . Personal history of chemotherapy 2014   Colon  . Personal history of radiation therapy 2014   Colon  . Urine incontinence 03/06/2013   h/o   Past Surgical History:  Procedure Laterality Date  . APPENDECTOMY  1979   colorectal  . CESAREAN SECTION    . COLONOSCOPY WITH PROPOFOL N/A 03/21/2015   Procedure: COLONOSCOPY WITH PROPOFOL;  Surgeon: Hulen Luster, MD;  Location: [facility] ENDOSCOPY;  Service: Gastroenterology;  Laterality: N/A;  . COLOSTOMY    . surgery for colorectal cancer    . VENTRAL HERNIA REPAIR Right [phone]   Family History  Problem Relation Age of Onset  . Hypertension Mother   . Hypertension Father   . Breast cancer Paternal Aunt    Social History   Socioeconomic History  . Marital status: Widowed     Spouse name: Not on file  . Number of children: Not on file  . Years of education: Not on file  . Highest education level: Not on file  Occupational History  . Not on file  Tobacco Use  . Smoking status: Never Smoker  . Smokeless tobacco: Never Used  Vaping Use  . Vaping Use: Never used  Substance and Sexual Activity  . Alcohol use: No    Alcohol/week: 0.0 standard drinks  . Drug use: No  . Sexual activity: Not Currently  Other Topics Concern  . Not on file  Social History Narrative  . Not on file   Social Determinants of Health   Financial Resource Strain:   . Difficulty of Paying Living Expenses:   Food Insecurity:   . Worried About Charity fundraiser in the Last Year:   . Arboriculturist in the Last Year:   Transportation Needs:   . Film/video editor (Medical):   Marland Kitchen Lack of Transportation (Non-Medical):   Physical Activity:   . Days of Exercise per Week:   . Minutes of Exercise per Session:   Stress:   . Feeling of Stress :   Social Connections:   . Frequency of Communication with Friends and Family:   . Frequency of Social Gatherings with Friends and Family:   .  Attends Religious Services:   . Active Member of Clubs or Organizations:   . Attends Archivist Meetings:   Marland Kitchen Marital Status:     Outpatient Encounter Medications as of 07/29/2019  Medication Sig  . alendronate (FOSAMAX) 70 MG tablet TAKE 1 TABLET BY MOUTH ONCE A WEEK *TAKE  WITH  A  FULL  GLASS  OF  WATER  ON  AN  EMPTY  STOMACH*  . Alpha-Lipoic Acid 100 MG CAPS Take 1 capsule by mouth daily.   Marland Kitchen aspirin EC 81 MG tablet Take 81 mg by mouth daily.  . B Complex-C (SUPER B COMPLEX PO) Take 1 tablet by mouth daily.  . Biotin 1000 MCG tablet Take 1,000 mcg by mouth 2 (two) times daily.   . calcium citrate-vitamin D (CITRACAL+D) 315-200 MG-UNIT per tablet Take 1 tablet by mouth 2 (two) times daily.  . Cholecalciferol (VITAMIN D3) 5000 units CAPS Take 1 capsule by mouth daily.  Marland Kitchen  glucosamine-chondroitin 500-400 MG tablet Take 1 tablet by mouth 2 (two) times daily.  Marland Kitchen losartan-hydrochlorothiazide (HYZAAR) 50-12.5 MG tablet Take 1 tablet by mouth daily.  Marland Kitchen lovastatin (MEVACOR) 10 MG tablet TAKE 1 TABLET(10 MG) BY MOUTH AT BEDTIME  . Multiple Vitamin (MULTI-VITAMINS) TABS Take 1 tablet by mouth daily.    Facility-Administered Encounter Medications as of 07/29/2019  Medication  . sodium chloride flush (NS) 0.9 % injection 10 mL  . sodium chloride flush (NS) 0.9 % injection 10 mL    Review of Systems  Constitutional: Negative for appetite change and unexpected weight change.  HENT: Negative for congestion and sinus pressure.   Eyes: Negative for pain and visual disturbance.  Respiratory: Negative for cough, chest tightness and shortness of breath.   Cardiovascular: Negative for chest pain, palpitations and leg swelling.  Gastrointestinal: Negative for abdominal pain, diarrhea, nausea and vomiting.  Genitourinary: Negative for difficulty urinating and dysuria.  Musculoskeletal: Negative for joint swelling and myalgias.  Skin: Negative for color change and rash.  Neurological: Negative for dizziness, light-headedness and headaches.  Hematological: Negative for adenopathy. Does not bruise/bleed easily.  Psychiatric/Behavioral: Negative for agitation and dysphoric mood.       Objective:    Physical Exam Vitals reviewed.  Constitutional:      General: She is not in acute distress.    Appearance: Normal appearance. She is well-developed.  HENT:     Head: Normocephalic and atraumatic.     Right Ear: External ear normal.     Left Ear: External ear normal.  Eyes:     General: No scleral icterus.       Right eye: No discharge.        Left eye: No discharge.     Conjunctiva/sclera: Conjunctivae normal.  Neck:     Thyroid: No thyromegaly.  Cardiovascular:     Rate and Rhythm: Normal rate and regular rhythm.  Pulmonary:     Effort: No tachypnea, accessory muscle  usage or respiratory distress.     Breath sounds: Normal breath sounds. No decreased breath sounds or wheezing.  Chest:     Breasts:        Right: No inverted nipple, mass, nipple discharge or tenderness (no axillary adenopathy).        Left: No inverted nipple, mass, nipple discharge or tenderness (no axilarry adenopathy).  Abdominal:     General: Bowel sounds are normal.     Palpations: Abdomen is soft.     Tenderness: There is no abdominal tenderness.  Comments: No tenderness to palpation around the ostomy.    Musculoskeletal:        General: No swelling or tenderness.     Cervical back: Neck supple. No tenderness.  Lymphadenopathy:     Cervical: No cervical adenopathy.  Skin:    Findings: No erythema or rash.  Neurological:     Mental Status: She is alert and oriented to person, place, and time.  Psychiatric:        Mood and Affect: Mood normal.        Behavior: Behavior normal.     BP 132/62   Pulse 72   Temp 97.7 F (36.5 C)   Resp 16   Ht 5' 3" (1.6 m)   Wt 142 lb (64.4 kg)   SpO2 98%   BMI 25.15 kg/m  Wt Readings from Last 3 Encounters:  07/29/19 142 lb (64.4 kg)  07/16/19 140 lb (63.5 kg)  03/18/19 138 lb (62.6 kg)     Lab Results  Component Value Date   WBC 6.0 04/01/2019   HGB 12.7 04/01/2019   HCT 37.1 04/01/2019   PLT 267.0 04/01/2019   GLUCOSE 117 (H) 07/27/2019   CHOL 110 07/27/2019   TRIG 103.0 07/27/2019   HDL 34.40 (L) 07/27/2019   LDLCALC 55 07/27/2019   ALT 25 07/27/2019   AST 23 07/27/2019   NA 141 07/27/2019   K 4.6 07/27/2019   CL 103 07/27/2019   CREATININE 0.99 07/27/2019   BUN 32 (H) 07/27/2019   CO2 30 07/27/2019   TSH 3.04 07/27/2019   INR 1.1 10/21/2012   HGBA1C 5.8 07/27/2019   MICROALBUR 1.1 10/19/2016    DG Bone Density  Result Date: 12/17/2018 EXAM: DUAL X-RAY ABSORPTIOMETRY (DXA) FOR BONE MINERAL DENSITY IMPRESSION: Technologist: MTB Your patient Raivyn Kabler completed a BMD test on 12/17/2018 using the  [employer] (analysis version: 14.10) manufactured by [employer]. The following summarizes the results of our evaluation. PATIENT BIOGRAPHICAL: Name: Maisey, Deandrade Patient ID: [id number] Birth Date: 05-30-1935 Height: 63.0 in. Gender: Female Exam Date: 12/17/2018 Weight: 148.0 lbs. Indications: Caucasian, Height Loss, History of Chemo, History of colon cancer, History of Radiation, Osteopenia, Postmenopausal, History of Fracture (Adult) Fractures: Right humerus Treatments: Calcium, Fosamax, Multi-Vitamin with calcium ASSESSMENT: The BMD measured at Femur Neck Right is 0.727 g/cm2 with a T-score of -2.2. This patient is considered OSTEOPENIC according to [facility]) criteria. The scan quality is good. Lumbar spine was not utilized due to advanced degenerative changes. Site Region Measured Measured WHO Young Adult BMD Date       Age      Classification T-score DualFemur Neck Right 12/17/2018 81.2 Osteopenia -2.2 0.727 g/cm2 DualFemur Neck Right 10/04/2015 78.0 Osteopenia -2.3 0.714 g/cm2 DualFemur Total Mean 12/17/2018 81.2 Osteopenia -2.0 0.761 g/cm2 DualFemur Total Mean 10/04/2015 78.0 Osteopenia -2.0 0.751 g/cm2 Left Forearm Radius 33% 12/17/2018 81.2 Osteopenia -1.2 0.769 g/cm2 Left Forearm Radius 33% 10/04/2015 78.0 Osteopenia -1.2 0.772 g/cm2 World Health Organization [facility]) criteria for post-menopausal, Caucasian Women: Normal:       T-score at or above -1 SD Osteopenia:   T-score between -1 and -2.5 SD Osteoporosis: T-score at or below -2.5 SD RECOMMENDATIONS: 1. All patients should optimize calcium and vitamin D intake. 2. Consider FDA-approved medical therapies in postmenopausal women and men aged 75 years and older, based on the following: a. A hip or vertebral(clinical or morphometric) fracture b. T-score < -2.5 at the femoral neck or spine after appropriate evaluation to  exclude secondary causes c. Low bone mass (T-score between -1.0 and -2.5 at the femoral neck or spine)  and a 10-year probability of a hip fracture > 3% or a 10-year probability of a major osteoporosis-related fracture > 20% based on the [place]-adapted WHO algorithm d. Clinician judgment and/or patient preferences may indicate treatment for people with 10-year fracture probabilities above or below these levels FOLLOW-UP: People with diagnosed cases of osteoporosis or at high risk for fracture should have regular bone mineral density tests. For patients eligible for Medicare, routine testing is allowed once every 2 years. The testing frequency can be increased to one year for patients who have rapidly progressing disease, those who are receiving or discontinuing medical therapy to restore bone mass, or have additional risk factors. I have reviewed this report, and agree with the above findings. Mark A. Thornton Papas, M.D. [facility] Rad[facility] Electronically Signed   By: Lavonia Dana M.D.   On: 12/17/2018 10:58   MM 3D SCREEN BREAST BILATERAL  Result Date: 12/17/2018 CLINICAL DATA:  Screening. EXAM: DIGITAL SCREENING BILATERAL MAMMOGRAM WITH TOMO AND CAD COMPARISON:  Previous exam(s). ACR Breast Density Category b: There are scattered areas of fibroglandular density. FINDINGS: There are no findings suspicious for malignancy. Images were processed with CAD. IMPRESSION: No mammographic evidence of malignancy. A result letter of this screening mammogram will be mailed directly to the patient. RECOMMENDATION: Screening mammogram in one year. (Code:SM-B-01Y) BI-RADS CATEGORY  1: Negative. Electronically Signed   By: Audie Pinto M.D.   On: 12/17/2018 15:33       Assessment & Plan:   Problem List Items Addressed This Visit    Amputated toe of left foot ([place])    S/p amputation.  Adjacent toe - bothering her.  Request referral back to podiatry.        Relevant Orders   Ambulatory referral to Podiatry   Carotid artery disease ([place])    Saw [facility]. Carotid ultrasound - left 1-39% and right no significant stenosis.   Recommended f/u in 12 months.  Last evaluated 07/10/19.        Colostomy status ([place])    Ostomy working well.        Health care maintenance    Physical today 07/29/19.  Mammogram 12/17/18 - Birads I.  Colonoscopy 2017 - due f/u 2022.        History of rectal cancer    S/p chemo and XRT and colectomy.  Last colonoscopy 03/2015.  Recommended f/u in 5 years.  Followed by Dr Grayland Ormond.  Last checked 07/2018.  Recommended f/u in one year.        Hypercholesterolemia    On lovastatin.  Low cholesterol diet and exercise.  Follow lipid panel and liver function tests.        Relevant Orders   Hepatic function panel   Lipid panel   Hyperglycemia    Low carb diet and exercise.  Follow met b and a1c.        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  Just evaluated 07/10/19.        Other Visit Diagnoses    Routine general medical examination at a health care facility    -  Primary       Einar Pheasant, MD

## 2019-08-03 ENCOUNTER — Encounter: Payer: Self-pay | Admitting: Internal Medicine

## 2019-08-03 NOTE — Assessment & Plan Note (Signed)
S/p chemo and XRT and colectomy.  Last colonoscopy 03/2015.  Recommended f/u in 5 years.  Followed by Dr Grayland Ormond.  Last checked 07/2018.  Recommended f/u in one year.

## 2019-08-03 NOTE — Assessment & Plan Note (Signed)
Low carb diet and exercise.  Follow met b and a1c.   

## 2019-08-03 NOTE — Assessment & Plan Note (Signed)
Physical today 07/29/19.  Mammogram 12/17/18 - Birads I.  Colonoscopy 2017 - due f/u 2022.

## 2019-08-03 NOTE — Assessment & Plan Note (Signed)
S/p amputation.  Adjacent toe - bothering her.  Request referral back to podiatry.

## 2019-08-03 NOTE — Assessment & Plan Note (Signed)
Ostomy working well.  

## 2019-08-03 NOTE — Assessment & Plan Note (Signed)
On lovastatin.  Low cholesterol diet and exercise.  Follow lipid panel and liver function tests.   

## 2019-08-03 NOTE — Assessment & Plan Note (Signed)
Followed by [facility].  Just evaluated 07/10/19.

## 2019-08-03 NOTE — Assessment & Plan Note (Signed)
Saw [facility]. Carotid ultrasound - left 1-39% and right no significant stenosis.  Recommended f/u in 12 months.  Last evaluated 07/10/19.

## 2019-08-14 ENCOUNTER — Other Ambulatory Visit: Payer: Self-pay | Admitting: Emergency Medicine

## 2019-08-14 DIAGNOSIS — C20 Malignant neoplasm of rectum: Secondary | ICD-10-CM

## 2019-08-15 NOTE — Progress Notes (Signed)
[place]  [phone]) [phone]  [phone]     Christina Mathis DOB: 1936/01/14  MR#: [id number]  KVQ#:[id number]  Patient Care Team: Einar Pheasant, MD as PCP - General (Internal Medicine) Paulla Dolly Tamala Fothergill, DPM as Consulting Physician (Podiatry) Grayland Ormond Kathlene November, MD as Consulting Physician (Oncology) Efrain Sella, MD as Consulting Physician (Gastroenterology)  CHIEF COMPLAINT: Adenocarcinoma of the rectum.  INTERVAL HISTORY: Patient returns to clinic today for repeat laboratory can routine yearly evaluation.  She continues to feel well and remains asymptomatic.  She reports no changes in bowel movements or problems with her colostomy.  She has no neurologic complaints.  She denies any recent fevers or illnesses.  She has no chest pain, shortness of breath, cough, or hemoptysis.  She has a good appetite and denies weight loss.  She has no nausea, vomiting, constipation, or diarrhea.  She reports no melena or hematochezia.  She has no urinary complaints.  Patient offers no specific complaints today.  REVIEW OF SYSTEMS:   Review of Systems  Constitutional: Negative.  Negative for fever, malaise/fatigue and weight loss.  Respiratory: Negative.  Negative for cough and shortness of breath.   Cardiovascular: Negative.  Negative for chest pain and leg swelling.  Gastrointestinal: Negative.  Negative for abdominal pain, blood in stool and melena.  Genitourinary: Negative.  Negative for dysuria.  Musculoskeletal: Negative.  Negative for back pain.  Skin: Negative.  Negative for rash.  Neurological: Negative.  Negative for tingling, sensory change, focal weakness, weakness and headaches.  Psychiatric/Behavioral: Negative.  The patient is not nervous/anxious.     As per HPI. Otherwise, a complete review of systems is negative.  ONCOLOGY HISTORY: Oncology History Overview Note  10. 84 year old female with stage III (T3, N1, M0) adenocarcinoma of the rectum for   preop chemoradiation 2. Starting 5-FU by continuous infusion and radiation therapy November 24, 2012 3. Patient is finishing up radiation and chemotherapy on December 15 of 2014 4. Status postsurgery and colostomyFebruary of 2015, pT0 pNO tumor stage 0 5. Post operative adjuvant with FOLFOX march 2015 6.patient finished total 6 cycles of chemotherapy with FOLFOX on August 31, 2013. 7.  January 2 016 [name] had hernia repair     PAST MEDICAL HISTORY: Past Medical History:  Diagnosis Date  . Colon cancer ([place])   . History of shingles    twice  . Hypercholesteremia   . Hypertension   . Left carotid bruit   . Nephrolithiasis   . Neuropathy   . Personal history of chemotherapy 2014   Colon  . Personal history of radiation therapy 2014   Colon  . Urine incontinence 03/06/2013   h/o    PAST SURGICAL HISTORY: Past Surgical History:  Procedure Laterality Date  . APPENDECTOMY  1979   colorectal  . CESAREAN SECTION    . COLONOSCOPY WITH PROPOFOL N/A 03/21/2015   Procedure: COLONOSCOPY WITH PROPOFOL;  Surgeon: Hulen Luster, MD;  Location: [facility] ENDOSCOPY;  Service: Gastroenterology;  Laterality: N/A;  . COLOSTOMY    . surgery for colorectal cancer    . VENTRAL HERNIA REPAIR Right [phone]    FAMILY HISTORY Family History  Problem Relation Age of Onset  . Hypertension Mother   . Hypertension Father   . Breast cancer Paternal Aunt     GYNECOLOGIC HISTORY:  No LMP recorded. Patient is postmenopausal.     ADVANCED DIRECTIVES:    HEALTH MAINTENANCE: Social History   Tobacco Use  . Smoking status: Never Smoker  .  Smokeless tobacco: Never Used  Vaping Use  . Vaping Use: Never used  Substance Use Topics  . Alcohol use: No    Alcohol/week: 0.0 standard drinks  . Drug use: No     No Known Allergies  Current Outpatient Medications  Medication Sig Dispense Refill  . alendronate (FOSAMAX) 70 MG tablet TAKE 1 TABLET BY MOUTH ONCE A WEEK *TAKE  WITH  A  FULL  GLASS  OF  WATER   ON  AN  EMPTY  STOMACH* 4 tablet 11  . Alpha-Lipoic Acid 100 MG CAPS Take 1 capsule by mouth daily.     Marland Kitchen aspirin EC 81 MG tablet Take 81 mg by mouth daily.    . B Complex-C (SUPER B COMPLEX PO) Take 1 tablet by mouth daily.    . Biotin 1000 MCG tablet Take 1,000 mcg by mouth 2 (two) times daily.     . calcium citrate-vitamin D (CITRACAL+D) 315-200 MG-UNIT per tablet Take 1 tablet by mouth 2 (two) times daily.    . Cholecalciferol (VITAMIN D3) 5000 units CAPS Take 1 capsule by mouth daily.    Marland Kitchen glucosamine-chondroitin 500-400 MG tablet Take 1 tablet by mouth 2 (two) times daily.    Marland Kitchen losartan-hydrochlorothiazide (HYZAAR) 50-12.5 MG tablet Take 1 tablet by mouth daily. 90 tablet 3  . lovastatin (MEVACOR) 10 MG tablet TAKE 1 TABLET(10 MG) BY MOUTH AT BEDTIME 90 tablet 1  . Multiple Vitamin (MULTI-VITAMINS) TABS Take 1 tablet by mouth daily.      No current facility-administered medications for this visit.   Facility-Administered Medications Ordered in Other Visits  Medication Dose Route Frequency Provider Last Rate Last Admin  . sodium chloride flush (NS) 0.9 % injection 10 mL  10 mL Intravenous PRN Choksi, Janak, MD   10 mL at 05/31/15 1040  . sodium chloride flush (NS) 0.9 % injection 10 mL  10 mL Intravenous PRN Choksi, Delorise Shiner, MD   10 mL at 07/12/15 1018    OBJECTIVE: BP 135/62 (BP Location: Left Arm, Patient Position: Sitting, Cuff Size: Normal)   Pulse 62   Temp (!) 97.2 F (36.2 C) (Tympanic)   Resp 20   Ht 5\' 3"  (1.6 m)   Wt 140 lb 4.8 oz (63.6 kg)   SpO2 97%   BMI 24.85 kg/m    Body mass index is 24.85 kg/m.    ECOG FS:0 - Asymptomatic  General: Well-developed, well-nourished, no acute distress. Eyes: Pink conjunctiva, anicteric sclera. HEENT: Normocephalic, moist mucous membranes. Lungs: No audible wheezing or coughing. Heart: Regular rate and rhythm. Abdomen: Soft, nontender, no obvious distention.  Colostomy noted. Musculoskeletal: No edema, cyanosis, or  clubbing. Neuro: Alert, answering all questions appropriately. Cranial nerves grossly intact. Skin: No rashes or petechiae noted. Psych: Normal affect.  LAB RESULTS:  Appointment on 08/17/2019  Component Date Value Ref Range Status  . WBC 08/17/2019 6.4  4.0 - 10.5 K/uL Final  . RBC 08/17/2019 3.63* 3.87 - 5.11 MIL/uL Final  . Hemoglobin 08/17/2019 12.6  12.0 - 15.0 g/dL Final  . HCT 08/17/2019 36.9  36 - 46 % Final  . MCV 08/17/2019 101.7* 80.0 - 100.0 fL Final  . MCH 08/17/2019 34.7* 26.0 - 34.0 pg Final  . MCHC 08/17/2019 34.1  30.0 - 36.0 g/dL Final  . RDW 08/17/2019 13.3  11.5 - 15.5 % Final  . Platelets 08/17/2019 260  150 - 400 K/uL Final  . nRBC 08/17/2019 0.0  0.0 - 0.2 % Final  . Neutrophils Relative %  08/17/2019 75  % Final  . Neutro Abs 08/17/2019 4.8  1.7 - 7.7 K/uL Final  . Lymphocytes Relative 08/17/2019 14  % Final  . Lymphs Abs 08/17/2019 0.9  0.7 - 4.0 K/uL Final  . Monocytes Relative 08/17/2019 9  % Final  . Monocytes Absolute 08/17/2019 0.6  0 - 1 K/uL Final  . Eosinophils Relative 08/17/2019 1  % Final  . Eosinophils Absolute 08/17/2019 0.0  0 - 0 K/uL Final  . Basophils Relative 08/17/2019 1  % Final  . Basophils Absolute 08/17/2019 0.1  0 - 0 K/uL Final  . Immature Granulocytes 08/17/2019 0  % Final  . Abs Immature Granulocytes 08/17/2019 0.02  0.00 - 0.07 K/uL Final   Performed at [facility], [address], [place], [place] [address]  . Sodium 08/17/2019 141  135 - 145 mmol/L Final  . Potassium 08/17/2019 4.1  3.5 - 5.1 mmol/L Final  . Chloride 08/17/2019 101  98 - 111 mmol/L Final  . CO2 08/17/2019 31  22 - 32 mmol/L Final  . Glucose, Bld 08/17/2019 130* 70 - 99 mg/dL Final   Glucose reference range applies only to samples taken after fasting for at least 8 hours.  . BUN 08/17/2019 28* 8 - 23 mg/dL Final  . Creatinine, Ser 08/17/2019 0.90  0.44 - 1.00 mg/dL Final  . Calcium 08/17/2019 8.9  8.9 - 10.3 mg/dL Final  . Total Protein 08/17/2019  6.9  6.5 - 8.1 g/dL Final  . Albumin 08/17/2019 3.9  3.5 - 5.0 g/dL Final  . AST 08/17/2019 25  15 - 41 U/L Final  . ALT 08/17/2019 26  0 - 44 U/L Final  . Alkaline Phosphatase 08/17/2019 47  38 - 126 U/L Final  . Total Bilirubin 08/17/2019 1.0  0.3 - 1.2 mg/dL Final  . GFR calc non Af Amer 08/17/2019 60* >60 mL/min Final  . GFR calc Af Amer 08/17/2019 >60  >60 mL/min Final  . Anion gap 08/17/2019 9  5 - 15 Final   Performed at [facility], [address], [place], [place] [address]   Lab Results  Component Value Date   CEA 2.3 01/11/2016    STUDIES: No results found.  ASSESSMENT:  Adenocarcinoma of the rectum, stage III, T3 N1 M0.  PLAN:    1. Rectal cancer: No evidence of disease.  Patient completed 6 cycles of adjuvant FOLFOX in August 2015. Patient's most recent colonoscopy on March 21, 2015 was reported as normal with no evidence of progressive or recurrent disease.  Recommendation was to repeat in 5 years in February 2022.  A referral has been sent to GI to schedule colonoscopy.  Her most recent CEA was within normal limits, today's result is pending.  No intervention is needed at this time.  Patient wishes to continue yearly follow-up with laboratory work.  Return to clinic in 1 year for further evaluation. 2. Lymphedema in the left lower extremity: Patient does not complain of this today.  Continue compression stockings as instructed by lymphedema clinic.  3. Peripheral neuropathy: Patient does not complain of this today.  Patient has discontinued Lyrica and gabapentin. 4.  Elevated MCV: Chronic and unchanged.  No intervention needed.   Patient expressed understanding and was in agreement with this plan. She also understands that She can call clinic at any time with any questions, concerns, or complaints.    Lloyd Huger, MD 08/17/19 6:25 PM

## 2019-08-17 ENCOUNTER — Inpatient Hospital Stay (HOSPITAL_BASED_OUTPATIENT_CLINIC_OR_DEPARTMENT_OTHER): Payer: Medicare Other | Admitting: Oncology

## 2019-08-17 ENCOUNTER — Encounter: Payer: Self-pay | Admitting: Oncology

## 2019-08-17 ENCOUNTER — Encounter (INDEPENDENT_AMBULATORY_CARE_PROVIDER_SITE_OTHER): Payer: Self-pay

## 2019-08-17 ENCOUNTER — Other Ambulatory Visit: Payer: Self-pay

## 2019-08-17 ENCOUNTER — Inpatient Hospital Stay: Payer: Medicare Other | Attending: Oncology

## 2019-08-17 VITALS — BP 135/62 | HR 62 | Temp 97.2°F | Resp 20 | Ht 63.0 in | Wt 140.3 lb

## 2019-08-17 DIAGNOSIS — I890 Lymphedema, not elsewhere classified: Secondary | ICD-10-CM | POA: Diagnosis not present

## 2019-08-17 DIAGNOSIS — C20 Malignant neoplasm of rectum: Secondary | ICD-10-CM | POA: Diagnosis not present

## 2019-08-17 DIAGNOSIS — E7800 Pure hypercholesterolemia, unspecified: Secondary | ICD-10-CM | POA: Diagnosis not present

## 2019-08-17 DIAGNOSIS — Z79899 Other long term (current) drug therapy: Secondary | ICD-10-CM | POA: Insufficient documentation

## 2019-08-17 DIAGNOSIS — Z85038 Personal history of other malignant neoplasm of large intestine: Secondary | ICD-10-CM | POA: Insufficient documentation

## 2019-08-17 DIAGNOSIS — I10 Essential (primary) hypertension: Secondary | ICD-10-CM | POA: Insufficient documentation

## 2019-08-17 DIAGNOSIS — Z923 Personal history of irradiation: Secondary | ICD-10-CM | POA: Insufficient documentation

## 2019-08-17 DIAGNOSIS — G629 Polyneuropathy, unspecified: Secondary | ICD-10-CM | POA: Diagnosis not present

## 2019-08-17 DIAGNOSIS — Z803 Family history of malignant neoplasm of breast: Secondary | ICD-10-CM | POA: Diagnosis not present

## 2019-08-17 DIAGNOSIS — Z9221 Personal history of antineoplastic chemotherapy: Secondary | ICD-10-CM | POA: Diagnosis not present

## 2019-08-17 DIAGNOSIS — Z7982 Long term (current) use of aspirin: Secondary | ICD-10-CM | POA: Insufficient documentation

## 2019-08-17 LAB — CBC WITH DIFFERENTIAL/PLATELET
Abs Immature Granulocytes: 0.02 10*3/uL (ref 0.00–0.07)
Basophils Absolute: 0.1 10*3/uL (ref 0.0–0.1)
Basophils Relative: 1 %
Eosinophils Absolute: 0 10*3/uL (ref 0.0–0.5)
Eosinophils Relative: 1 %
HCT: 36.9 % (ref 36.0–46.0)
Hemoglobin: 12.6 g/dL (ref 12.0–15.0)
Immature Granulocytes: 0 %
Lymphocytes Relative: 14 %
Lymphs Abs: 0.9 10*3/uL (ref 0.7–4.0)
MCH: 34.7 pg — ABNORMAL HIGH (ref 26.0–34.0)
MCHC: 34.1 g/dL (ref 30.0–36.0)
MCV: 101.7 fL — ABNORMAL HIGH (ref 80.0–100.0)
Monocytes Absolute: 0.6 10*3/uL (ref 0.1–1.0)
Monocytes Relative: 9 %
Neutro Abs: 4.8 10*3/uL (ref 1.7–7.7)
Neutrophils Relative %: 75 %
Platelets: 260 10*3/uL (ref 150–400)
RBC: 3.63 MIL/uL — ABNORMAL LOW (ref 3.87–5.11)
RDW: 13.3 % (ref 11.5–15.5)
WBC: 6.4 10*3/uL (ref 4.0–10.5)
nRBC: 0 % (ref 0.0–0.2)

## 2019-08-17 LAB — COMPREHENSIVE METABOLIC PANEL
ALT: 26 U/L (ref 0–44)
AST: 25 U/L (ref 15–41)
Albumin: 3.9 g/dL (ref 3.5–5.0)
Alkaline Phosphatase: 47 U/L (ref 38–126)
Anion gap: 9 (ref 5–15)
BUN: 28 mg/dL — ABNORMAL HIGH (ref 8–23)
CO2: 31 mmol/L (ref 22–32)
Calcium: 8.9 mg/dL (ref 8.9–10.3)
Chloride: 101 mmol/L (ref 98–111)
Creatinine, Ser: 0.9 mg/dL (ref 0.44–1.00)
GFR calc Af Amer: >60 mL/min (ref >60)
GFR calc non Af Amer: 60 mL/min — ABNORMAL LOW (ref >60)
Glucose, Bld: 130 mg/dL — ABNORMAL HIGH (ref 70–99)
Potassium: 4.1 mmol/L (ref 3.5–5.1)
Sodium: 141 mmol/L (ref 135–145)
Total Bilirubin: 1 mg/dL (ref 0.3–1.2)
Total Protein: 6.9 g/dL (ref 6.5–8.1)

## 2019-08-18 LAB — CEA: CEA: 3 ng/mL (ref 0.0–4.7)

## 2019-08-31 ENCOUNTER — Other Ambulatory Visit: Payer: Self-pay

## 2019-08-31 ENCOUNTER — Encounter: Payer: Self-pay | Admitting: Podiatry

## 2019-08-31 ENCOUNTER — Ambulatory Visit: Payer: Medicare Other | Admitting: Podiatry

## 2019-08-31 DIAGNOSIS — M2012 Hallux valgus (acquired), left foot: Secondary | ICD-10-CM | POA: Diagnosis not present

## 2019-08-31 NOTE — Progress Notes (Signed)
She presents today concerned that her hallux valgus deformity is worsening because of we amputated her second toe left.  She is worried and worried that it is dislocated.  Objective: Vital signs are stable she alert oriented x3 she does have severe hallux valgus deformity.  It is dislocating but has not dislocated as of yet.  There are no open wounds there are no lesions.  Still has good range of motion.  Assessment: Hallux valgus no opposition of second toe secondary to amputation.  Plan: We placed padding bunion shields and I will follow-up with her as needed.

## 2019-09-01 ENCOUNTER — Other Ambulatory Visit: Payer: Self-pay

## 2019-09-03 ENCOUNTER — Encounter: Payer: Self-pay | Admitting: Internal Medicine

## 2019-09-03 ENCOUNTER — Other Ambulatory Visit: Payer: Self-pay

## 2019-09-03 ENCOUNTER — Ambulatory Visit: Payer: Medicare Other | Admitting: Internal Medicine

## 2019-09-03 VITALS — BP 136/70 | HR 72 | Temp 97.9°F | Ht 63.0 in | Wt 141.8 lb

## 2019-09-03 DIAGNOSIS — R319 Hematuria, unspecified: Secondary | ICD-10-CM

## 2019-09-03 DIAGNOSIS — N3000 Acute cystitis without hematuria: Secondary | ICD-10-CM | POA: Diagnosis not present

## 2019-09-03 LAB — POCT URINALYSIS DIP (MANUAL ENTRY)
Bilirubin, UA: NEGATIVE
Glucose, UA: NEGATIVE mg/dL
Ketones, POC UA: NEGATIVE mg/dL
Nitrite, UA: NEGATIVE
Spec Grav, UA: <=1.005 — AB (ref 1.010–1.025)
Urobilinogen, UA: 0.2 E.U./dL
pH, UA: 5 (ref 5.0–8.0)

## 2019-09-03 MED ORDER — CEPHALEXIN 500 MG PO CAPS: 500.0000 mg | ORAL_CAPSULE | Freq: Four times a day (QID) | ORAL | 0 refills | Status: DC

## 2019-09-03 NOTE — Patient Instructions (Addendum)
Try increased water, cranberry juice  Urinary Tract Infection, Adult  A urinary tract infection (UTI) is an infection of any part of the urinary tract. The urinary tract includes the kidneys, ureters, bladder, and urethra. These organs make, store, and get rid of urine in the body. Your health care provider may use other names to describe the infection. An upper UTI affects the ureters and kidneys (pyelonephritis). A lower UTI affects the bladder (cystitis) and urethra (urethritis). What are the causes? Most urinary tract infections are caused by bacteria in your genital area, around the entrance to your urinary tract (urethra). These bacteria grow and cause inflammation of your urinary tract. What increases the risk? You are more likely to develop this condition if:  You have a urinary catheter that stays in place (indwelling).  You are not able to control when you urinate or have a bowel movement (you have incontinence).  You are female and you: ? Use a spermicide or diaphragm for birth control. ? Have low estrogen levels. ? Are pregnant.  You have certain genes that increase your risk (genetics).  You are sexually active.  You take antibiotic medicines.  You have a condition that causes your flow of urine to slow down, such as: ? An enlarged prostate, if you are female. ? Blockage in your urethra (stricture). ? A kidney stone. ? A nerve condition that affects your bladder control (neurogenic bladder). ? Not getting enough to drink, or not urinating often.  You have certain medical conditions, such as: ? Diabetes. ? A weak disease-fighting system (immunesystem). ? Sickle cell disease. ? Gout. ? Spinal cord injury. What are the signs or symptoms? Symptoms of this condition include:  Needing to urinate right away (urgently).  Frequent urination or passing small amounts of urine frequently.  Pain or burning with urination.  Blood in the urine.  Urine that smells bad or  unusual.  Trouble urinating.  Cloudy urine.  Vaginal discharge, if you are female.  Pain in the abdomen or the lower back. You may also have:  Vomiting or a decreased appetite.  Confusion.  Irritability or tiredness.  A fever.  Diarrhea. The first symptom in older adults may be confusion. In some cases, they may not have any symptoms until the infection has worsened. How is this diagnosed? This condition is diagnosed based on your medical history and a physical exam. You may also have other tests, including:  Urine tests.  Blood tests.  Tests for sexually transmitted infections (STIs). If you have had more than one UTI, a cystoscopy or imaging studies may be done to determine the cause of the infections. How is this treated? Treatment for this condition includes:  Antibiotic medicine.  Over-the-counter medicines to treat discomfort.  Drinking enough water to stay hydrated. If you have frequent infections or have other conditions such as a kidney stone, you may need to see a health care provider who specializes in the urinary tract (urologist). In rare cases, urinary tract infections can cause sepsis. Sepsis is a life-threatening condition that occurs when the body responds to an infection. Sepsis is treated in the hospital with IV antibiotics, fluids, and other medicines. Follow these instructions at home:  Medicines  Take over-the-counter and prescription medicines only as told by your health care provider.  If you were prescribed an antibiotic medicine, take it as told by your health care provider. Do not stop using the antibiotic even if you start to feel better. General instructions  Make sure you: ?  Empty your bladder often and completely. Do not hold urine for long periods of time. ? Empty your bladder after sex. ? Wipe from front to back after a bowel movement if you are female. Use each tissue one time when you wipe.  Drink enough fluid to keep your urine  pale yellow.  Keep all follow-up visits as told by your health care provider. This is important. Contact a health care provider if:  Your symptoms do not get better after 1-2 days.  Your symptoms go away and then return. Get help right away if you have:  Severe pain in your back or your lower abdomen.  A fever.  Nausea or vomiting. Summary  A urinary tract infection (UTI) is an infection of any part of the urinary tract, which includes the kidneys, ureters, bladder, and urethra.  Most urinary tract infections are caused by bacteria in your genital area, around the entrance to your urinary tract (urethra).  Treatment for this condition often includes antibiotic medicines.  If you were prescribed an antibiotic medicine, take it as told by your health care provider. Do not stop using the antibiotic even if you start to feel better.  Keep all follow-up visits as told by your health care provider. This is important. This information is not intended to replace advice given to you by your health care provider. Make sure you discuss any questions you have with your health care provider. Document Revised: 01/02/2018 Document Reviewed: 07/25/2017 Elsevier Patient Education  2020 [employer].

## 2019-09-03 NOTE — Addendum Note (Signed)
Addended by: Leeanne Rio on: 09/03/2019 02:37 PM   Modules accepted: Orders

## 2019-09-03 NOTE — Progress Notes (Signed)
Patient presenting with bleeding upon wiping and slight burning with urination. Onset of spotting was last Friday with discomfort starting around Sunday and Monday.

## 2019-09-03 NOTE — Progress Notes (Signed)
Chief Complaint  Patient presents with  . Urinary Tract Infection   F/u  1. Friday with wiping had pink on the tissue and since has noted blood in pad she wears and changes 3-4 x per day. She does have h/o kidney stones. She has had trouble emptying bladder and hesitancy w/o dysuria. She drinks 8 bottles of water daily has colostomy bag left lower abdomen which has not been leaking stool and appears clean and well kep  Review of Systems  Respiratory: Negative for shortness of breath.   Cardiovascular: Negative for chest pain.  Genitourinary: Positive for hematuria. Negative for dysuria and flank pain.   Past Medical History:  Diagnosis Date  . Colon cancer ([place])   . History of shingles    twice  . Hypercholesteremia   . Hypertension   . Left carotid bruit   . Nephrolithiasis   . Neuropathy   . Personal history of chemotherapy 2014   Colon  . Personal history of radiation therapy 2014   Colon  . Urine incontinence 03/06/2013   h/o   Past Surgical History:  Procedure Laterality Date  . APPENDECTOMY  1979   colorectal  . CESAREAN SECTION    . COLONOSCOPY WITH PROPOFOL N/A 03/21/2015   Procedure: COLONOSCOPY WITH PROPOFOL;  Surgeon: Hulen Luster, MD;  Location: [facility]COPY;  Service: Gastroenterology;  Laterality: N/A;  . COLOSTOMY    . surgery for colorectal cancer    . VENTRAL HERNIA REPAIR Right [id number]   Family History  Problem Relation Age of Onset  . Hypertension Mother   . Hypertension Father   . Breast cancer Paternal Aunt    Social History   Socioeconomic History  . Marital status: Widowed    Spouse name: Not on file  . Number of children: Not on file  . Years of education: Not on file  . Highest education level: Not on file  Occupational History  . Not on file  Tobacco Use  . Smoking status: Never Smoker  . Smokeless tobacco: Never Used  Vaping Use  . Vaping Use: Never used  Substance and Sexual Activity  . Alcohol use: No    Alcohol/week: 0.0  standard drinks  . Drug use: No  . Sexual activity: Not Currently  Other Topics Concern  . Not on file  Social History Narrative  . Not on file   Social Determinants of Health   Financial Resource Strain:   . Difficulty of Paying Living Expenses:   Food Insecurity:   . Worried About Charity fundraiser in the Last Year:   . Arboriculturist in the Last Year:   Transportation Needs:   . Film/video editor (Medical):   Marland Kitchen Lack of Transportation (Non-Medical):   Physical Activity:   . Days of Exercise per Week:   . Minutes of Exercise per Session:   Stress:   . Feeling of Stress :   Social Connections:   . Frequency of Communication with Friends and Family:   . Frequency of Social Gatherings with Friends and Family:   . Attends Religious Services:   . Active Member of Clubs or Organizations:   . Attends Archivist Meetings:   Marland Kitchen Marital Status:   Intimate Partner Violence:   . Fear of Current or Ex-Partner:   . Emotionally Abused:   Marland Kitchen Physically Abused:   . Sexually Abused:    Current Meds  Medication Sig  . alendronate (FOSAMAX) 70 MG tablet TAKE 1 TABLET  BY MOUTH ONCE A WEEK *TAKE  WITH  A  FULL  GLASS  OF  WATER  ON  AN  EMPTY  STOMACH*  . Alpha-Lipoic Acid 100 MG CAPS Take 1 capsule by mouth daily.   Marland Kitchen aspirin EC 81 MG tablet Take 81 mg by mouth daily.  . B Complex-C (SUPER B COMPLEX PO) Take 1 tablet by mouth daily.  . Biotin 1000 MCG tablet Take 1,000 mcg by mouth 2 (two) times daily.   . calcium citrate-vitamin D (CITRACAL+D) 315-200 MG-UNIT per tablet Take 1 tablet by mouth 2 (two) times daily.  . Cholecalciferol (VITAMIN D3) 5000 units CAPS Take 1 capsule by mouth daily.  Marland Kitchen glucosamine-chondroitin 500-400 MG tablet Take 1 tablet by mouth 2 (two) times daily.  Marland Kitchen losartan-hydrochlorothiazide (HYZAAR) 50-12.5 MG tablet Take 1 tablet by mouth daily.  Marland Kitchen lovastatin (MEVACOR) 10 MG tablet TAKE 1 TABLET(10 MG) BY MOUTH AT BEDTIME  . Multiple Vitamin  (MULTI-VITAMINS) TABS Take 1 tablet by mouth daily.    No Known Allergies Recent Results (from the past 2160 hour(s))  Basic metabolic panel     Status: Abnormal   Collection Time: 07/27/19  8:21 AM  Result Value Ref Range   Sodium 141 135 - 145 mEq/L   Potassium 4.6 3.5 - 5.1 mEq/L   Chloride 103 96 - 112 mEq/L   CO2 30 19 - 32 mEq/L   Glucose, Bld 117 (H) 70 - 99 mg/dL   BUN 32 (H) 6 - 23 mg/dL   Creatinine, Ser 0.99 0.40 - 1.20 mg/dL   GFR 53.72 (L) >60.00 mL/min   Calcium 9.4 8.4 - 10.5 mg/dL  Hepatic function panel     Status: None   Collection Time: 07/27/19  8:21 AM  Result Value Ref Range   Total Bilirubin 0.4 0.2 - 1.2 mg/dL   Bilirubin, Direct 0.1 0.0 - 0.3 mg/dL   Alkaline Phosphatase 42 39 - 117 U/L   AST 23 0 - 37 U/L   ALT 25 0 - 35 U/L   Total Protein 6.6 6.0 - 8.3 g/dL   Albumin 4.0 3.5 - 5.2 g/dL  Hemoglobin A1c     Status: None   Collection Time: 07/27/19  8:21 AM  Result Value Ref Range   Hgb A1c MFr Bld 5.8 4.6 - 6.5 %    Comment: Glycemic Control Guidelines for People with Diabetes:Non Diabetic:  <6%Goal of Therapy: <7%Additional Action Suggested:  >8%   Lipid panel     Status: Abnormal   Collection Time: 07/27/19  8:21 AM  Result Value Ref Range   Cholesterol 110 0 - 200 mg/dL    Comment: ATP III Classification       Desirable:  < 200 mg/dL               Borderline High:  200 - 239 mg/dL          High:  > = 240 mg/dL   Triglycerides 103.0 0 - 149 mg/dL    Comment: Normal:  <150 mg/dLBorderline High:  150 - 199 mg/dL   HDL 34.40 (L) >39.00 mg/dL   VLDL 20.6 0.0 - 40.0 mg/dL   LDL Cholesterol 55 0 - 99 mg/dL   Total CHOL/HDL Ratio 3     Comment:                Men          Women1/2 Average Risk     3.4  3.3Average Risk          5.0          4.42X Average Risk          9.6          7.13X Average Risk          15.0          11.0                       NonHDL 75.44     Comment: NOTE:  Non-HDL goal should be 30 mg/dL higher than patient's LDL goal  (i.e. LDL goal of < 70 mg/dL, would have non-HDL goal of < 100 mg/dL)  TSH     Status: None   Collection Time: 07/27/19  8:21 AM  Result Value Ref Range   TSH 3.04 0.35 - 4.50 uIU/mL  CBC with Differential/Platelet     Status: Abnormal   Collection Time: 08/17/19 10:24 AM  Result Value Ref Range   WBC 6.4 4.0 - 10.5 K/uL   RBC 3.63 (L) 3.87 - 5.11 MIL/uL   Hemoglobin 12.6 12.0 - 15.0 g/dL   HCT 36.9 36 - 46 %   MCV 101.7 (H) 80.0 - 100.0 fL   MCH 34.7 (H) 26.0 - 34.0 pg   MCHC 34.1 30.0 - 36.0 g/dL   RDW 13.3 11.5 - 15.5 %   Platelets 260 150 - 400 K/uL   nRBC 0.0 0.0 - 0.2 %   Neutrophils Relative % 75 %   Neutro Abs 4.8 1.7 - 7.7 K/uL   Lymphocytes Relative 14 %   Lymphs Abs 0.9 0.7 - 4.0 K/uL   Monocytes Relative 9 %   Monocytes Absolute 0.6 0 - 1 K/uL   Eosinophils Relative 1 %   Eosinophils Absolute 0.0 0 - 0 K/uL   Basophils Relative 1 %   Basophils Absolute 0.1 0 - 0 K/uL   Immature Granulocytes 0 %   Abs Immature Granulocytes 0.02 0.00 - 0.07 K/uL    Comment: Performed at [facility], [address] [place], [place] [address]  CEA     Status: None   Collection Time: 08/17/19 10:24 AM  Result Value Ref Range   CEA 3.0 0.0 - 4.7 ng/mL    Comment: (NOTE)                             Nonsmokers          <3.9                             Smokers             <5.6 [employer] Electrochemiluminescence Immunoassay (ECLIA) Values obtained with different assay methods or kits cannot be used interchangeably.  Results cannot be interpreted as absolute evidence of the presence or absence of malignant disease. Performed At: [facility] [place], [place] [id number] Rush Farmer MD [id number]   Comprehensive metabolic panel     Status: Abnormal   Collection Time: 08/17/19 10:24 AM  Result Value Ref Range   Sodium 141 135 - 145 mmol/L   Potassium 4.1 3.5 - 5.1 mmol/L   Chloride 101 98 - 111 mmol/L   CO2 31 22 - 32 mmol/L   Glucose,  Bld 130 (H) 70 - 99 mg/dL    Comment: Glucose reference  range applies only to samples taken after fasting for at least 8 hours.   BUN 28 (H) 8 - 23 mg/dL   Creatinine, Ser 0.90 0.44 - 1.00 mg/dL   Calcium 8.9 8.9 - 10.3 mg/dL   Total Protein 6.9 6.5 - 8.1 g/dL   Albumin 3.9 3.5 - 5.0 g/dL   AST 25 15 - 41 U/L   ALT 26 0 - 44 U/L   Alkaline Phosphatase 47 38 - 126 U/L   Total Bilirubin 1.0 0.3 - 1.2 mg/dL   GFR calc non Af Amer 60 (L) >60 mL/min   GFR calc Af Amer >60 >60 mL/min   Anion gap 9 5 - 15    Comment: Performed at [facility], [address] [place], [place] [address]   Objective  Body mass index is 25.12 kg/m. Wt Readings from Last 3 Encounters:  09/03/19 141 lb 12.8 oz (64.3 kg)  08/17/19 140 lb 4.8 oz (63.6 kg)  07/29/19 142 lb (64.4 kg)   Temp Readings from Last 3 Encounters:  09/03/19 97.9 F (36.6 C) (Oral)  08/17/19 (!) 97.2 F (36.2 C) (Tympanic)  07/29/19 97.7 F (36.5 C)   BP Readings from Last 3 Encounters:  09/03/19 136/70  08/17/19 135/62  08/03/19 132/62   Pulse Readings from Last 3 Encounters:  09/03/19 72  08/17/19 62  07/29/19 72    Physical Exam Vitals and nursing note reviewed.  Constitutional:      Appearance: Normal appearance. She is well-developed and well-groomed.  HENT:     Head: Normocephalic and atraumatic.  Eyes:     Conjunctiva/sclera: Conjunctivae normal.     Pupils: Pupils are equal, round, and reactive to light.  Cardiovascular:     Rate and Rhythm: Normal rate and regular rhythm.     Heart sounds: Normal heart sounds. No murmur heard.   Pulmonary:     Effort: Pulmonary effort is normal.     Breath sounds: Normal breath sounds.  Abdominal:     General: Abdomen is flat. Bowel sounds are normal.     Tenderness: There is no abdominal tenderness. There is no right CVA tenderness or left CVA tenderness.     Comments: Left ostomy intact clean on issues  Neurological:     General: No focal deficit present.      Mental Status: She is alert and oriented to person, place, and time.     Gait: Gait normal.  Psychiatric:        Attention and Perception: Attention and perception normal.        Mood and Affect: Mood and affect normal.        Speech: Speech normal.        Behavior: Behavior normal. Behavior is cooperative.        Thought Content: Thought content normal.        Cognition and Memory: Cognition and memory normal.        Judgment: Judgment normal.     Assessment  Plan  Acute cystitis without hematuria - Plan: Urine Culture, Urinalysis, dipstick only, cephALEXin (KEFLEX) 500 MG capsule qid x 5-7 days Hematuria, ddx uti vs kidney stones  - Plan: cephALEXin (KEFLEX) 500 MG capsule  Increase water   Provider: Dr. Olivia Mackie McLean-Scocuzza-Internal Medicine

## 2019-09-05 LAB — URINE CULTURE
MICRO NUMBER:: 10791979
Result:: NO GROWTH
SPECIMEN QUALITY:: ADEQUATE

## 2019-09-07 ENCOUNTER — Other Ambulatory Visit: Payer: Self-pay | Admitting: Internal Medicine

## 2019-09-07 DIAGNOSIS — R319 Hematuria, unspecified: Secondary | ICD-10-CM

## 2019-09-11 ENCOUNTER — Telehealth: Payer: Self-pay | Admitting: Internal Medicine

## 2019-09-11 NOTE — Telephone Encounter (Signed)
-----   Message from Delorise Jackson, MD sent at 09/07/2019  3:06 PM EDT ----- Urine did not grow uti  How is she feeling?  Urine dipstick did show blood   Please schedule repeat urine test  She can stop antibiotics

## 2019-09-11 NOTE — Telephone Encounter (Signed)
Pt returned call about lab results 

## 2019-09-11 NOTE — Telephone Encounter (Signed)
Patient informed and verbalized understanding

## 2019-09-11 NOTE — Telephone Encounter (Signed)
Pt called in for results  

## 2019-09-12 ENCOUNTER — Other Ambulatory Visit: Payer: Self-pay | Admitting: Internal Medicine

## 2019-09-14 ENCOUNTER — Other Ambulatory Visit: Payer: Medicare Other

## 2019-09-14 ENCOUNTER — Other Ambulatory Visit: Payer: Self-pay

## 2019-09-14 DIAGNOSIS — R319 Hematuria, unspecified: Secondary | ICD-10-CM

## 2019-09-15 LAB — URINALYSIS, ROUTINE W REFLEX MICROSCOPIC
Bacteria, UA: NONE SEEN /HPF
Bilirubin Urine: NEGATIVE
Glucose, UA: NEGATIVE
Hgb urine dipstick: NEGATIVE
Hyaline Cast: NONE SEEN /LPF
Ketones, ur: NEGATIVE
Nitrite: NEGATIVE
Protein, ur: NEGATIVE
Specific Gravity, Urine: 1.013 (ref 1.001–1.03)
Squamous Epithelial / HPF: NONE SEEN /HPF (ref <=5)
pH: <=5 (ref 5.0–8.0)

## 2019-09-23 DIAGNOSIS — Z933 Colostomy status: Secondary | ICD-10-CM | POA: Diagnosis not present

## 2019-09-23 DIAGNOSIS — Z85038 Personal history of other malignant neoplasm of large intestine: Secondary | ICD-10-CM | POA: Diagnosis not present

## 2019-11-09 ENCOUNTER — Other Ambulatory Visit: Payer: Self-pay | Admitting: Internal Medicine

## 2019-11-09 DIAGNOSIS — Z1231 Encounter for screening mammogram for malignant neoplasm of breast: Secondary | ICD-10-CM

## 2019-11-10 DIAGNOSIS — Z85038 Personal history of other malignant neoplasm of large intestine: Secondary | ICD-10-CM | POA: Diagnosis not present

## 2019-11-10 DIAGNOSIS — Z933 Colostomy status: Secondary | ICD-10-CM | POA: Diagnosis not present

## 2019-11-17 ENCOUNTER — Encounter: Payer: Self-pay | Admitting: Internal Medicine

## 2019-11-27 ENCOUNTER — Other Ambulatory Visit: Payer: Medicare Other

## 2019-11-30 ENCOUNTER — Ambulatory Visit: Payer: Medicare Other | Admitting: Internal Medicine

## 2019-12-10 ENCOUNTER — Other Ambulatory Visit (INDEPENDENT_AMBULATORY_CARE_PROVIDER_SITE_OTHER): Payer: Medicare Other

## 2019-12-10 ENCOUNTER — Other Ambulatory Visit: Payer: Medicare Other

## 2019-12-10 ENCOUNTER — Other Ambulatory Visit: Payer: Self-pay

## 2019-12-10 DIAGNOSIS — E7800 Pure hypercholesterolemia, unspecified: Secondary | ICD-10-CM | POA: Diagnosis not present

## 2019-12-10 DIAGNOSIS — Z1231 Encounter for screening mammogram for malignant neoplasm of breast: Secondary | ICD-10-CM

## 2019-12-10 DIAGNOSIS — R739 Hyperglycemia, unspecified: Secondary | ICD-10-CM

## 2019-12-10 LAB — BASIC METABOLIC PANEL
BUN: 32 mg/dL — ABNORMAL HIGH (ref 6–23)
CO2: 35 mEq/L — ABNORMAL HIGH (ref 19–32)
Calcium: 9.6 mg/dL (ref 8.4–10.5)
Chloride: 103 mEq/L (ref 96–112)
Creatinine, Ser: 0.99 mg/dL (ref 0.40–1.20)
GFR: 53.21 mL/min — ABNORMAL LOW (ref >60.00)
Glucose, Bld: 121 mg/dL — ABNORMAL HIGH (ref 70–99)
Potassium: 4.9 mEq/L (ref 3.5–5.1)
Sodium: 142 mEq/L (ref 135–145)

## 2019-12-10 LAB — LIPID PANEL
Cholesterol: 108 mg/dL (ref 0–200)
HDL: 38.2 mg/dL — ABNORMAL LOW (ref >39.00)
LDL Cholesterol: 56 mg/dL (ref 0–99)
NonHDL: 69.96
Total CHOL/HDL Ratio: 3
Triglycerides: 70 mg/dL (ref 0.0–149.0)
VLDL: 14 mg/dL (ref 0.0–40.0)

## 2019-12-10 LAB — HEPATIC FUNCTION PANEL
ALT: 21 U/L (ref 0–35)
AST: 23 U/L (ref 0–37)
Albumin: 3.9 g/dL (ref 3.5–5.2)
Alkaline Phosphatase: 45 U/L (ref 39–117)
Bilirubin, Direct: 0.1 mg/dL (ref 0.0–0.3)
Total Bilirubin: 0.7 mg/dL (ref 0.2–1.2)
Total Protein: 6.2 g/dL (ref 6.0–8.3)

## 2019-12-10 LAB — HEMOGLOBIN A1C: Hgb A1c MFr Bld: 5.9 % (ref 4.6–6.5)

## 2019-12-14 ENCOUNTER — Encounter: Payer: Self-pay | Admitting: Internal Medicine

## 2019-12-14 ENCOUNTER — Ambulatory Visit: Payer: Medicare Other | Admitting: Internal Medicine

## 2019-12-14 ENCOUNTER — Other Ambulatory Visit: Payer: Self-pay

## 2019-12-14 DIAGNOSIS — S98132A Complete traumatic amputation of one left lesser toe, initial encounter: Secondary | ICD-10-CM | POA: Diagnosis not present

## 2019-12-14 DIAGNOSIS — H2513 Age-related nuclear cataract, bilateral: Secondary | ICD-10-CM | POA: Diagnosis not present

## 2019-12-14 DIAGNOSIS — Z933 Colostomy status: Secondary | ICD-10-CM | POA: Diagnosis not present

## 2019-12-14 DIAGNOSIS — I6523 Occlusion and stenosis of bilateral carotid arteries: Secondary | ICD-10-CM | POA: Diagnosis not present

## 2019-12-14 DIAGNOSIS — R739 Hyperglycemia, unspecified: Secondary | ICD-10-CM

## 2019-12-14 DIAGNOSIS — Z85048 Personal history of other malignant neoplasm of rectum, rectosigmoid junction, and anus: Secondary | ICD-10-CM | POA: Diagnosis not present

## 2019-12-14 DIAGNOSIS — E7800 Pure hypercholesterolemia, unspecified: Secondary | ICD-10-CM

## 2019-12-14 DIAGNOSIS — M8580 Other specified disorders of bone density and structure, unspecified site: Secondary | ICD-10-CM

## 2019-12-14 DIAGNOSIS — I771 Stricture of artery: Secondary | ICD-10-CM

## 2019-12-14 NOTE — Progress Notes (Signed)
[name], female   DOB: October 28, 1935, 84 y.o.   MRN: [id number]   Subjective:    [name], female    DOB: July 25, 1935, 84 y.o.   MRN: [id number]  HPI This visit occurred during the SARS-CoV-2 public health emergency.  Safety protocols were in place, including screening questions prior to the visit, additional usage of staff PPE, and extensive cleaning of exam room while observing appropriate contact time as indicated for disinfecting solutions.  Patient here for a scheduled follow up.  Here to follow up regarding her blood pressure, cholesterol and to f/u regarding her fosamax. She reports she is doing relatively well.  No chest pain or sob.  No acid reflux.  No abdominal pain.  Bowels moving.  Blood pressure doing well.  Had questions about fosamax.  Appears she started fosamax 09/2015.  Will continue for now.  Discussed drug holiday after 5 years.  Had questions about covid booster and flu vaccine.     Past Medical History:  Diagnosis Date   Colon cancer [facility])    History of shingles    twice   Hypercholesteremia    Hypertension    Left carotid bruit    Nephrolithiasis    Neuropathy    Personal history of chemotherapy 2014   Colon   Personal history of radiation therapy 2014   Colon   Urine incontinence 03/06/2013   h/o   Past Surgical History:  Procedure Laterality Date   APPENDECTOMY  1979   colorectal   CESAREAN SECTION     COLONOSCOPY WITH PROPOFOL N/A 03/21/2015   Procedure: COLONOSCOPY WITH PROPOFOL;  Surgeon: Hulen Luster, MD;  Location: [facility] ENDOSCOPY;  Service: Gastroenterology;  Laterality: N/A;   COLOSTOMY     surgery for colorectal cancer     VENTRAL HERNIA REPAIR Right [phone]   Family History  Problem Relation Age of Onset   Hypertension Mother    Hypertension Father    Breast cancer Paternal Aunt    Social History   Socioeconomic History   Marital status: Widowed    Spouse name: Not on file   Number of  children: Not on file   Years of education: Not on file   Highest education level: Not on file  Occupational History   Not on file  Tobacco Use   Smoking status: Never Smoker   Smokeless tobacco: Never Used  Vaping Use   Vaping Use: Never used  Substance and Sexual Activity   Alcohol use: No    Alcohol/week: 0.0 standard drinks   Drug use: No   Sexual activity: Not Currently  Other Topics Concern   Not on file  Social History Narrative   Not on file   Social Determinants of Health   Financial Resource Strain:    Difficulty of Paying Living Expenses: Not on file  Food Insecurity:    Worried About [place] in the Last Year: Not on file   Ran Out of Food in the Last Year: Not on file  Transportation Needs:    Lack of Transportation (Medical): Not on file   Lack of Transportation (Non-Medical): Not on file  Physical Activity:    Days of Exercise per Week: Not on file   Minutes of Exercise per Session: Not on file  Stress:    Feeling of Stress : Not on file  Social Connections:    Frequency of Communication with Friends and Family: Not on file   Frequency of Social Gatherings  with Friends and Family: Not on file   Attends Religious Services: Not on file   Active Member of Clubs or Organizations: Not on file   Attends Club or Organization Meetings: Not on file   Marital Status: Not on file    Outpatient Encounter Medications as of 12/14/2019  Medication Sig   alendronate (FOSAMAX) 70 MG tablet TAKE 1 TABLET BY MOUTH 1 TIME A WEEK WITH A FULL GLASS OF WATER AND ON AN EMPTY STOMACH   Alpha-Lipoic Acid 100 MG CAPS Take 1 capsule by mouth daily.    aspirin EC 81 MG tablet Take 81 mg by mouth daily.   B Complex-C (SUPER B COMPLEX PO) Take 1 tablet by mouth daily.   Biotin 1000 MCG tablet Take 1,000 mcg by mouth 2 (two) times daily.    calcium citrate-vitamin D (CITRACAL+D) 315-200 MG-UNIT per tablet Take 1 tablet by mouth 2 (two) times  daily.   Cholecalciferol (VITAMIN D3) 5000 units CAPS Take 1 capsule by mouth daily.   glucosamine-chondroitin 500-400 MG tablet Take 1 tablet by mouth 2 (two) times daily.   losartan-hydrochlorothiazide (HYZAAR) 50-12.5 MG tablet Take 1 tablet by mouth daily.   lovastatin (MEVACOR) 10 MG tablet TAKE 1 TABLET(10 MG) BY MOUTH AT BEDTIME   Multiple Vitamin (MULTI-VITAMINS) TABS Take 1 tablet by mouth daily.    [DISCONTINUED] cephALEXin (KEFLEX) 500 MG capsule Take 1 capsule (500 mg total) by mouth 4 (four) times daily. X 5-7 days with food   Facility-Administered Encounter Medications as of 12/14/2019  Medication   sodium chloride flush (NS) 0.9 % injection 10 mL   sodium chloride flush (NS) 0.9 % injection 10 mL    Review of Systems  Constitutional: Negative for appetite change and unexpected weight change.  HENT: Negative for congestion and sinus pressure.   Respiratory: Negative for cough, chest tightness and shortness of breath.   Cardiovascular: Negative for chest pain, palpitations and leg swelling.  Gastrointestinal: Negative for abdominal pain, diarrhea, nausea and vomiting.  Genitourinary: Negative for difficulty urinating and dysuria.  Musculoskeletal: Negative for joint swelling and myalgias.  Neurological: Negative for dizziness, light-headedness and headaches.  Psychiatric/Behavioral: Negative for agitation and dysphoric mood.       Objective:    Physical Exam Vitals reviewed.  Constitutional:      General: She is not in acute distress.    Appearance: Normal appearance.  HENT:     Head: Normocephalic and atraumatic.     Right Ear: External ear normal.     Left Ear: External ear normal.  Eyes:     General: No scleral icterus.       Right eye: No discharge.        Left eye: No discharge.     Conjunctiva/sclera: Conjunctivae normal.  Neck:     Thyroid: No thyromegaly.  Cardiovascular:     Rate and Rhythm: Normal rate and regular rhythm.  Pulmonary:      Effort: No respiratory distress.     Breath sounds: Normal breath sounds. No wheezing.  Abdominal:     General: Bowel sounds are normal.     Palpations: Abdomen is soft.     Tenderness: There is no abdominal tenderness.  Musculoskeletal:        General: No swelling or tenderness.     Cervical back: Neck supple. No tenderness.  Lymphadenopathy:     Cervical: No cervical adenopathy.  Skin:    Findings: No erythema or rash.  Neurological:     Mental  Status: She is alert.  Psychiatric:        Mood and Affect: Mood normal.        Behavior: Behavior normal.     BP 122/70    Pulse 68    Temp 98 F (36.7 C) (Oral)    Resp 16    Ht '5\' 3"'  (1.6 m)    Wt 141 lb 9.6 oz (64.2 kg)    SpO2 99%    BMI 25.08 kg/m  Wt Readings from Last 3 Encounters:  12/14/19 141 lb 9.6 oz (64.2 kg)  09/03/19 141 lb 12.8 oz (64.3 kg)  08/17/19 140 lb 4.8 oz (63.6 kg)     Lab Results  Component Value Date   WBC 6.4 08/17/2019   HGB 12.6 08/17/2019   HCT 36.9 08/17/2019   PLT 260 08/17/2019   GLUCOSE 121 (H) 12/10/2019   CHOL 108 12/10/2019   TRIG 70.0 12/10/2019   HDL 38.20 (L) 12/10/2019   LDLCALC 56 12/10/2019   ALT 21 12/10/2019   AST 23 12/10/2019   NA 142 12/10/2019   K 4.9 12/10/2019   CL 103 12/10/2019   CREATININE 0.99 12/10/2019   BUN 32 (H) 12/10/2019   CO2 35 (H) 12/10/2019   TSH 3.04 07/27/2019   INR 1.1 10/21/2012   HGBA1C 5.9 12/10/2019   MICROALBUR 1.1 10/19/2016    DG Bone Density  Result Date: 12/17/2018 EXAM: DUAL X-RAY ABSORPTIOMETRY (DXA) FOR BONE MINERAL DENSITY IMPRESSION: Technologist: MTB Your patient Vilma Will completed a BMD test on 12/17/2018 using the Melvin (analysis version: 14.10) manufactured by [employer]. The following summarizes the results of our evaluation. PATIENT BIOGRAPHICAL: Name: Jacci, Ruberg Patient ID: [id number] Birth Date: 1935/08/18 Height: 63.0 in. Gender: Female Exam Date: 12/17/2018 Weight: 148.0 lbs. Indications:  Caucasian, Height Loss, History of Chemo, History of colon cancer, History of Radiation, Osteopenia, Postmenopausal, History of Fracture (Adult) Fractures: Right humerus Treatments: Calcium, Fosamax, Multi-Vitamin with calcium ASSESSMENT: The BMD measured at Femur Neck Right is 0.727 g/cm2 with a T-score of -2.2. This patient is considered OSTEOPENIC according to [facility]) criteria. The scan quality is good. Lumbar spine was not utilized due to advanced degenerative changes. Site Region Measured Measured WHO Young Adult BMD Date       Age      Classification T-score DualFemur Neck Right 12/17/2018 81.2 Osteopenia -2.2 0.727 g/cm2 DualFemur Neck Right 10/04/2015 78.0 Osteopenia -2.3 0.714 g/cm2 DualFemur Total Mean 12/17/2018 81.2 Osteopenia -2.0 0.761 g/cm2 DualFemur Total Mean 10/04/2015 78.0 Osteopenia -2.0 0.751 g/cm2 Left Forearm Radius 33% 12/17/2018 81.2 Osteopenia -1.2 0.769 g/cm2 Left Forearm Radius 33% 10/04/2015 78.0 Osteopenia -1.2 0.772 g/cm2 World Health [facility]) criteria for post-menopausal, Caucasian Women: Normal:       T-score at or above -1 SD Osteopenia:   T-score between -1 and -2.5 SD Osteoporosis: T-score at or below -2.5 SD RECOMMENDATIONS: 1. All patients should optimize calcium and vitamin D intake. 2. Consider FDA-approved medical therapies in postmenopausal women and men aged 56 years and older, based on the following: a. A hip or vertebral(clinical or morphometric) fracture b. T-score < -2.5 at the femoral neck or spine after appropriate evaluation to exclude secondary causes c. Low bone mass (T-score between -1.0 and -2.5 at the femoral neck or spine) and a 10-year probability of a hip fracture > 3% or a 10-year probability of a major osteoporosis-related fracture > 20% based on the [place]-adapted WHO algorithm d. Clinician judgment and/or patient preferences  may indicate treatment for people with 10-year fracture probabilities above or below these levels  FOLLOW-UP: People with diagnosed cases of osteoporosis or at high risk for fracture should have regular bone mineral density tests. For patients eligible for Medicare, routine testing is allowed once every 2 years. The testing frequency can be increased to one year for patients who have rapidly progressing disease, those who are receiving or discontinuing medical therapy to restore bone mass, or have additional risk factors. I have reviewed this report, and agree with the above findings. Mark A. Thornton Papas, M.D. [facility] Radiology Electronically Signed   By: Lavonia Dana M.D.   On: 12/17/2018 10:58   MM 3D SCREEN BREAST BILATERAL  Result Date: 12/17/2018 CLINICAL DATA:  Screening. EXAM: DIGITAL SCREENING BILATERAL MAMMOGRAM WITH TOMO AND CAD COMPARISON:  Previous exam(s). ACR Breast Density Category b: There are scattered areas of fibroglandular density. FINDINGS: There are no findings suspicious for malignancy. Images were processed with CAD. IMPRESSION: No mammographic evidence of malignancy. A result letter of this screening mammogram will be mailed directly to the patient. RECOMMENDATION: Screening mammogram in one year. (Code:SM-B-01Y) BI-RADS CATEGORY  1: Negative. Electronically Signed   By: Audie Pinto M.D.   On: 12/17/2018 15:33       Assessment & Plan:   Problem List Items Addressed This Visit    Subclavian arterial stenosis ([place])    Followed by [facility].  Just evaluated 06/2019.        Osteopenia    On fosamax.  Started in 2017.  Will continue for 5 years.  Continue calcium, vitamin  D and weight bearing exercise.        Hyperglycemia    Low carb diet and exercise.  Follow met b and a1c.       Relevant Orders   Hemoglobin R4Y   Basic metabolic panel   Hypercholesterolemia    On lovastatin.  Low cholesterol diet and exercise.  Follow lipid panel and liver function tests.        Relevant Orders   Hepatic function panel   Lipid panel   History of rectal cancer    S/p chemo,  XRT and colectomy.  Last colonoscopy I 03/2015.  Recommended f/u in 5 years.  Followed by Dr Grayland Ormond.  Last evaluated 08/17/19 - recommended f/u in one year.       Colostomy status ([place])    Ostomy working well.  Follow.       Carotid artery disease ([place])    Saw [facility].  Last evaluated 06/2019.  Carotid ultrasound - left 1-39% and right no significant stenosis.  Recommended f/u in 12 months.        Amputated toe of left foot ([place])    S/p amputation.  Adjacent toe - bothering her.  Have discussed referral to podiatry.            Einar Pheasant, MD

## 2019-12-18 ENCOUNTER — Other Ambulatory Visit: Payer: Self-pay

## 2019-12-18 ENCOUNTER — Ambulatory Visit
Admission: RE | Admit: 2019-12-18 | Discharge: 2019-12-18 | Disposition: A | Discharge status: Home / Self Care | Payer: Medicare Other | Source: Ambulatory Visit | Attending: Internal Medicine | Admitting: Internal Medicine

## 2019-12-18 DIAGNOSIS — Z1231 Encounter for screening mammogram for malignant neoplasm of breast: Secondary | ICD-10-CM | POA: Insufficient documentation

## 2019-12-20 ENCOUNTER — Encounter: Payer: Self-pay | Admitting: Internal Medicine

## 2019-12-20 NOTE — Assessment & Plan Note (Signed)
Followed by [facility].  Just evaluated 06/2019.

## 2019-12-20 NOTE — Assessment & Plan Note (Signed)
S/p amputation.  Adjacent toe - bothering her.  Have discussed referral to podiatry.

## 2019-12-20 NOTE — Assessment & Plan Note (Signed)
Ostomy working well.  Follow.  

## 2019-12-20 NOTE — Assessment & Plan Note (Signed)
On lovastatin.  Low cholesterol diet and exercise.  Follow lipid panel and liver function tests.   

## 2019-12-20 NOTE — Assessment & Plan Note (Signed)
Low carb diet and exercise.  Follow met b and a1c.  

## 2019-12-20 NOTE — Assessment & Plan Note (Signed)
On fosamax.  Started in 2017.  Will continue for 5 years.  Continue calcium, vitamin  D and weight bearing exercise.

## 2019-12-20 NOTE — Assessment & Plan Note (Signed)
Saw [facility].  Last evaluated 06/2019.  Carotid ultrasound - left 1-39% and right no significant stenosis.  Recommended f/u in 12 months.  

## 2019-12-20 NOTE — Assessment & Plan Note (Signed)
S/p chemo, XRT and colectomy.  Last colonoscopy I 03/2015.  Recommended f/u in 5 years.  Followed by Dr Grayland Ormond.  Last evaluated 08/17/19 - recommended f/u in one year.

## 2020-01-03 ENCOUNTER — Other Ambulatory Visit: Payer: Self-pay | Admitting: Internal Medicine

## 2020-01-04 ENCOUNTER — Telehealth: Payer: Self-pay | Admitting: Internal Medicine

## 2020-01-04 MED ORDER — LOSARTAN POTASSIUM-HCTZ 50-12.5 MG PO TABS
1.0000 | ORAL_TABLET | Freq: Every day | ORAL | 3 refills | Status: DC
Start: 2020-01-04 — End: 2020-12-23

## 2020-01-04 NOTE — Telephone Encounter (Signed)
Patient has changed pharmacy to [facility], on [address]. They will not refill her losartan-hydrochlorothiazide (HYZAAR) 50-12.5 MG tablet and patient needs a refill.

## 2020-01-12 DIAGNOSIS — Z85038 Personal history of other malignant neoplasm of large intestine: Secondary | ICD-10-CM | POA: Diagnosis not present

## 2020-01-15 ENCOUNTER — Other Ambulatory Visit: Payer: Self-pay | Admitting: Internal Medicine

## 2020-01-18 DIAGNOSIS — Z0120 Encounter for dental examination and cleaning without abnormal findings: Secondary | ICD-10-CM | POA: Diagnosis not present

## 2020-02-12 ENCOUNTER — Telehealth (INDEPENDENT_AMBULATORY_CARE_PROVIDER_SITE_OTHER): Payer: Medicare Other | Admitting: Family

## 2020-02-12 ENCOUNTER — Encounter: Payer: Self-pay | Admitting: Family

## 2020-02-12 ENCOUNTER — Other Ambulatory Visit: Payer: Self-pay

## 2020-02-12 ENCOUNTER — Other Ambulatory Visit: Payer: Medicare Other

## 2020-02-12 DIAGNOSIS — J320 Chronic maxillary sinusitis: Secondary | ICD-10-CM

## 2020-02-12 DIAGNOSIS — R0981 Nasal congestion: Secondary | ICD-10-CM | POA: Diagnosis not present

## 2020-02-12 DIAGNOSIS — J329 Chronic sinusitis, unspecified: Secondary | ICD-10-CM | POA: Insufficient documentation

## 2020-02-12 MED ORDER — AMOXICILLIN-POT CLAVULANATE 875-125 MG PO TABS: 1.0000 | ORAL_TABLET | Freq: Two times a day (BID) | ORAL | 0 refills | Status: AC

## 2020-02-12 NOTE — Assessment & Plan Note (Signed)
Afebrile. No acute respiratory distress. Duration 12 days and I suspect secondary bacterial infection. Start augmentin, probiotics. Advised quarantine until results of covid test.

## 2020-02-12 NOTE — Progress Notes (Signed)
Verbal consent for services obtained from patient prior to services given to TELEPHONE visit:   Location of call:  provider at work patient at home  Names of all persons present for services: Mable Paris, NP and patient Chief complaint: right sided nasal congestion, sneezing 12 days, unchanged.  Thick discolored mucous, muffled hearing right ear.   No fever, facial pain, sob, cough, cp, diarrhea, vomiting.  Raking leaves for past 14 days ago and suspect contributory  Started claritin 6 days without relief.  Scheduled for colonoscopy 02/22/20, covid test 02/18/20- Dr Haig Prophet  H/o HTN, colon cancer Never smoker No known covid contact.  Vaccinated Estate manager/land agent with booster CKD- 53.21  A/P/next steps:  Problem List Items Addressed This Visit      Respiratory   Sinusitis - Primary    Afebrile. No acute respiratory distress. Duration 12 days and I suspect secondary bacterial infection. Start augmentin, probiotics. Advised quarantine until results of covid test.       Relevant Medications   loratadine (CLARITIN) 10 MG tablet   amoxicillin-clavulanate (AUGMENTIN) 875-125 MG tablet       I spent 15 min  discussing plan of care over the phone.

## 2020-02-14 LAB — SARS-COV-2, NAA 2 DAY TAT

## 2020-02-14 LAB — NOVEL CORONAVIRUS, NAA: SARS-CoV-2, NAA: NOT DETECTED

## 2020-02-18 ENCOUNTER — Other Ambulatory Visit: Payer: Medicare Other | Attending: Gastroenterology

## 2020-03-17 ENCOUNTER — Other Ambulatory Visit: Payer: Self-pay

## 2020-03-17 ENCOUNTER — Other Ambulatory Visit
Admission: RE | Admit: 2020-03-17 | Discharge: 2020-03-17 | Disposition: A | Discharge status: Home / Self Care | Payer: Medicare Other | Source: Ambulatory Visit | Attending: Gastroenterology | Admitting: Gastroenterology

## 2020-03-17 DIAGNOSIS — Z20822 Contact with and (suspected) exposure to covid-19: Secondary | ICD-10-CM | POA: Insufficient documentation

## 2020-03-17 DIAGNOSIS — Z01812 Encounter for preprocedural laboratory examination: Secondary | ICD-10-CM | POA: Insufficient documentation

## 2020-03-17 LAB — SARS CORONAVIRUS 2 (TAT 6-24 HRS): SARS Coronavirus 2: NEGATIVE

## 2020-03-18 ENCOUNTER — Ambulatory Visit (INDEPENDENT_AMBULATORY_CARE_PROVIDER_SITE_OTHER): Payer: Medicare Other

## 2020-03-18 VITALS — Ht 63.0 in | Wt 141.0 lb

## 2020-03-18 DIAGNOSIS — Z0000 Encounter for general adult medical examination without abnormal findings: Secondary | ICD-10-CM | POA: Diagnosis not present

## 2020-03-18 NOTE — Progress Notes (Signed)
Subjective:   Christina Mathis is a 85 y.o. female who presents for Medicare Annual (Subsequent) preventive examination.  Review of Systems    No ROS.  Medicare Wellness Virtual Visit.     Cardiac Risk Factors include: advanced age (>65men, >74 women)     Objective:    Today's Vitals   03/18/20 0840  Weight: 141 lb (64 kg)  Height: 5\' 3"  (1.6 m)   Body mass index is 24.98 kg/m.  Advanced Directives 03/18/2020 03/18/2019 08/13/2018 03/14/2018 08/14/2017 03/08/2017 07/06/2016  Does Patient Have a Medical Advance Directive? Yes Yes No No No No No  Type of Paramedic of [place];Living will [place]  Does patient want to make changes to medical advance directive? No - Patient declined No - Patient declined - - - - -  Copy of [place] in Chart? No - copy requested No - copy requested - - - - -  Would patient like information on creating a medical advance directive? - - No - Patient declined No - Patient declined No - Patient declined Yes (MAU/Ambulatory/Procedural Areas - Information given) -    Current Medications (verified) Outpatient Encounter Medications as of 03/18/2020  Medication Sig  . alendronate (FOSAMAX) 70 MG tablet TAKE 1 TABLET BY MOUTH 1 TIME A WEEK WITH A FULL GLASS OF WATER AND ON AN EMPTY STOMACH  . Alpha-Lipoic Acid 100 MG CAPS Take 1 capsule by mouth daily.  (Patient not taking: Reported on 03/18/2020)  . aspirin EC 81 MG tablet Take 81 mg by mouth daily.  . B Complex-C (SUPER B COMPLEX PO) Take 1 tablet by mouth daily. (Patient not taking: Reported on 03/18/2020)  . Biotin 1000 MCG tablet Take 1,000 mcg by mouth 2 (two) times daily.   . calcium citrate-vitamin D (CITRACAL+D) 315-200 MG-UNIT per tablet Take 1 tablet by mouth 2 (two) times daily.  . Cholecalciferol (VITAMIN D3) 5000 units CAPS Take 1 capsule by mouth daily.  Marland Kitchen glucosamine-chondroitin 500-400 MG tablet Take 1 tablet by mouth 2 (two)  times daily.  Marland Kitchen loratadine (CLARITIN) 10 MG tablet Take 10 mg by mouth daily.  Marland Kitchen losartan-hydrochlorothiazide (HYZAAR) 50-12.5 MG tablet Take 1 tablet by mouth daily.  Marland Kitchen lovastatin (MEVACOR) 10 MG tablet TAKE 1 TABLET(10 MG) BY MOUTH AT BEDTIME  . Multiple Vitamin (MULTI-VITAMINS) TABS Take 1 tablet by mouth daily.    Facility-Administered Encounter Medications as of 03/18/2020  Medication  . sodium chloride flush (NS) 0.9 % injection 10 mL  . sodium chloride flush (NS) 0.9 % injection 10 mL    Allergies (verified) Patient has no known allergies.   History: Past Medical History:  Diagnosis Date  . Colon cancer ([place])   . History of shingles    twice  . Hypercholesteremia   . Hypertension   . Left carotid bruit   . Nephrolithiasis   . Neuropathy   . Personal history of chemotherapy 2014   Colon  . Personal history of radiation therapy 2014   Colon  . Urine incontinence 03/06/2013   h/o   Past Surgical History:  Procedure Laterality Date  . APPENDECTOMY  1979   colorectal  . CESAREAN SECTION    . COLONOSCOPY WITH PROPOFOL N/A 03/21/2015   Procedure: COLONOSCOPY WITH PROPOFOL;  Surgeon: Hulen Luster, MD;  Location[facility] ENDOSCOPY;  Service: Gastroenterology;  Laterality: N/A;  . COLOSTOMY    . surgery for colorectal cancer    .  VENTRAL HERNIA REPAIR Right [phone]   Family History  Problem Relation Age of Onset  . Hypertension Mother   . Hypertension Father   . Breast cancer Paternal Aunt    Social History   Socioeconomic History  . Marital status: Widowed    Spouse name: Not on file  . Number of children: Not on file  . Years of education: Not on file  . Highest education level: Not on file  Occupational History  . Not on file  Tobacco Use  . Smoking status: Never Smoker  . Smokeless tobacco: Never Used  Vaping Use  . Vaping Use: Never used  Substance and Sexual Activity  . Alcohol use: No    Alcohol/week: 0.0 standard drinks  . Drug use: No  . Sexual  activity: Not Currently  Other Topics Concern  . Not on file  Social History Narrative  . Not on file   Social Determinants of Health   Financial Resource Strain: Low Risk   . Difficulty of Paying Living Expenses: Not hard at all  Food Insecurity: No Food Insecurity  . Worried About Charity fundraiser in the Last Year: Never true  . Ran Out of Food in the Last Year: Never true  Transportation Needs: No Transportation Needs  . Lack of Transportation (Medical): No  . Lack of Transportation (Non-Medical): No  Physical Activity: Insufficiently Active  . Days of Exercise per Week: 4 days  . Minutes of Exercise per Session: 30 min  Stress: No Stress Concern Present  . Feeling of Stress : Not at all  Social Connections: Unknown  . Frequency of Communication with Friends and Family: More than three times a week  . Frequency of Social Gatherings with Friends and Family: Not on file  . Attends Religious Services: Not on file  . Active Member of Clubs or Organizations: Yes  . Attends Archivist Meetings: Not on file  . Marital Status: Not on file    Tobacco Counseling Counseling given: Not Answered   Clinical Intake:  Pre-visit preparation completed: Yes        Diabetes: No  How often do you need to have someone help you when you read instructions, pamphlets, or other written materials from your doctor or pharmacy?: 1 - Never  Interpreter Needed?: No      Activities of Daily Living In your present state of health, do you have any difficulty performing the following activities: 03/18/2020  Hearing? N  Vision? N  Difficulty concentrating or making decisions? N  Walking or climbing stairs? N  Dressing or bathing? N  Doing errands, shopping? N  Preparing Food and eating ? N  Using the Toilet? N  In the past six months, have you accidently leaked urine? N  Do you have problems with loss of bowel control? N  Managing your Medications? N  Managing your  Finances? N  Housekeeping or managing your Housekeeping? N  Some recent data might be hidden    Patient Care Team: Einar Pheasant, MD as PCP - General (Internal Medicine) Regal, Tamala Fothergill, DPM as Consulting Physician (Podiatry) Grayland Ormond Kathlene November, MD as Consulting Physician (Oncology) Efrain Sella, MD as Consulting Physician (Gastroenterology)  Indicate any recent Medical Services you may have received from other than [facility] providers in the past year (date may be approximate).     Assessment:   This is a routine wellness examination for Christina Mathis.  I connected with Dominica today by telephone and verified that I am  speaking with the correct person using two identifiers. Location patient: home Location provider: work Persons participating in the virtual visit: patient, Marine scientist.    I discussed the limitations, risks, security and privacy concerns of performing an evaluation and management service by telephone and the availability of in person appointments. The patient expressed understanding and verbally consented to this telephonic visit.    Interactive audio and video telecommunications were attempted between this provider and patient, however failed, due to patient having technical difficulties OR patient did not have access to video capability.  We continued and completed visit with audio only.  Some vital signs may be absent or patient reported.   Hearing/Vision screen  Hearing Screening   125Hz  250Hz  500Hz  1000Hz  2000Hz  3000Hz  4000Hz  6000Hz  8000Hz   Right ear:           Left ear:           Vision Screening Comments: Follow[facility] Wears corrective lenses Visual acuity not assessed, virtual visit.  They have seen their ophthalmologist in the last 12 months.     Dietary issues and exercise activities discussed: Current Exercise Habits: Home exercise routine, Type of exercise: walking, Intensity: Mild  Healthy diet Good water intake  Goals   None     Depression Screen PHQ 2/9 Scores 03/18/2020 03/18/2019 03/14/2018 03/08/2017 03/07/2016 10/18/2015 06/09/2014  PHQ - 2 Score 0 0 0 0 0 0 0    Fall Risk Fall Risk  03/18/2020 09/03/2019 03/18/2019 03/14/2018 03/08/2017  Falls in the past year? 0 0 0 0 No  Number falls in past yr: 0 0 - - -  Injury with Fall? 0 0 - - -  Follow up Falls evaluation completed Falls evaluation completed Falls evaluation completed - -    FALL RISK PREVENTION PERTAINING TO THE HOME: Handrails in use when climbing stairs? Yes Home free of loose throw rugs in walkways, pet beds, electrical cords, etc? Yes  Adequate lighting in your home to reduce risk of falls? Yes   ASSISTIVE DEVICES UTILIZED TO PREVENT FALLS: Life alert? No  Use of a cane, walker or w/c? No  Grab bars in the bathroom? Yes   TIMED UP AND GO: Was the test performed? No . Virtual visit.   Cognitive Function: Patient is alert and oriented x3.  Denies difficulty focusing, making decisions, memory loss.  MMSE/6CIT deferred. Normal by direct communication/observation.  MMSE - Mini Mental State Exam 03/08/2017  Orientation to time 5  Orientation to Place 5  Registration 3  Attention/ Calculation 5  Recall 1  Language- name 2 objects 2  Language- repeat 1  Language- follow 3 step command 3  Language- read & follow direction 1  Write a sentence 1  Copy design 1  Total score 28     6CIT Screen 03/18/2020 03/18/2019 03/14/2018 03/07/2016  What Year? 0 points 0 points 0 points 0 points  What month? 0 points 0 points 0 points 0 points  What time? 0 points 0 points 0 points 0 points  Count back from 20 0 points 0 points 0 points 0 points  Months in reverse 0 points 0 points 0 points 0 points  Repeat phrase 0 points 0 points 0 points 0 points  Total Score 0 0 0 0    Immunizations Immunization History  Administered Date(s) Administered  . Fluad Quad(high Dose 65+) 11/28/2018  . Influenza, High Dose Seasonal PF 10/18/2015, 10/22/2016, 11/01/2017  .  Influenza,inj,Quad PF,6+ Mos 10/12/2014  . Influenza-Unspecified 11/30/2013  .  PFIZER(Purple Top)SARS-COV-2 Vaccination 02/20/2019, 03/10/2019, 01/01/2020  . Pneumococcal Conjugate-13 10/22/2016  . Pneumococcal Polysaccharide-23 01/30/2004   Health Maintenance Health Maintenance  Topic Date Due  . TETANUS/TDAP  Never done  . INFLUENZA VACCINE  08/30/2019  . FOOT EXAM  09/13/2019  . COLONOSCOPY (Pts 45-32yrs Insurance coverage will need to be confirmed)  03/20/2020  . HEMOGLOBIN A1C  06/08/2020  . COVID-19 Vaccine (4 - Booster for Pfizer series) 07/01/2020  . OPHTHALMOLOGY EXAM  11/29/2020  . MAMMOGRAM  12/17/2020  . DEXA SCAN  Completed  . PNA vac Low Risk Adult  Completed   Colorectal cancer screening: Type of screening: Colonoscopy. Completed 03/21/15. Repeat every 5 years  Mammogram status: Completed 12/18/19. Repeat every year  Bone density- 12/17/18.  Lung Cancer Screening: (Low Dose CT Chest recommended if Age 18-80 years, 30 pack-year currently smoking OR have quit w/in 15years.) does not qualify.   Hepatitis C Screening: does not qualify.  Vision Screening: Recommended annual ophthalmology exams for early detection of glaucoma and other disorders of the eye. Is the patient up to date with their annual eye exam?  Yes  Who is the provider or what is the name of the office in which the patient attends annual eye exams? [facility], Dr. George Ina.  Dental Screening: Recommended annual dental exams for proper oral hygiene. Visits every 6 months.   Community Resource Referral / Chronic Care Management: CRR required this visit?  No   CCM required this visit?  No      Plan:   Keep all routine maintenance appointments.   Next scheduled lab 04/13/20 @ 8:00  Follow up 04/15/20 @ 9:00  I have personally reviewed and noted the following in the patient's chart:   . Medical and social history . Use of alcohol, tobacco or illicit drugs  . Current medications and  supplements . Functional ability and status . Nutritional status . Physical activity . Advanced directives . List of other physicians . Hospitalizations, surgeries, and ER visits in previous 12 months . Vitals . Screenings to include cognitive, depression, and falls . Referrals and appointments  In addition, I have reviewed and discussed with patient certain preventive protocols, quality metrics, and best practice recommendations. A written personalized care plan for preventive services as well as general preventive health recommendations were provided to patient via mail.     Varney Biles, LPN   [phone]

## 2020-03-18 NOTE — Patient Instructions (Addendum)
Christina Mathis , Thank you for taking time to come for your Medicare Wellness Visit. I appreciate your ongoing commitment to your health goals. Please review the following plan we discussed and let me know if I can assist you in the future.   These are the goals we discussed: Goals   None     This is a list of the screening recommended for you and due dates:  Health Maintenance  Topic Date Due  . Tetanus Vaccine  Never done  . Flu Shot  08/30/2019  . Complete foot exam   09/13/2019  . Colon Cancer Screening  03/20/2020  . Hemoglobin A1C  06/08/2020  . COVID-19 Vaccine (4 - Booster for Pfizer series) 07/01/2020  . Eye exam for diabetics  11/29/2020  . Mammogram  12/17/2020  . DEXA scan (bone density measurement)  Completed  . Pneumonia vaccines  Completed    Immunizations Immunization History  Administered Date(s) Administered  . Fluad Quad(high Dose 65+) 11/28/2018  . Influenza, High Dose Seasonal PF 10/18/2015, 10/22/2016, 11/01/2017  . Influenza,inj,Quad PF,6+ Mos 10/12/2014  . Influenza-Unspecified 11/30/2013  . PFIZER(Purple Top)SARS-COV-2 Vaccination 02/20/2019, 03/10/2019, 01/01/2020  . Pneumococcal Conjugate-13 10/22/2016  . Pneumococcal Polysaccharide-23 01/30/2004   Keep all routine maintenance appointments.   Next scheduled lab 04/13/20 @ 8:00  Follow up 04/15/20 @ 9:00  Advanced directives: End of life planning; Advance aging; Advanced directives discussed.  Copy of current HCPOA/Living Will requested.    Conditions/risks identified: none new  Follow up in one year for your annual wellness visit    Preventive Care 65 Years and Older, Female Preventive care refers to lifestyle choices and visits with your health care provider that can promote health and wellness. What does preventive care include?  A yearly physical exam. This is also called an annual well check.  Dental exams once or twice a year.  Routine eye exams. Ask your health care provider how  often you should have your eyes checked.  Personal lifestyle choices, including:  Daily care of your teeth and gums.  Regular physical activity.  Eating a healthy diet.  Avoiding tobacco and drug use.  Limiting alcohol use.  Practicing safe sex.  Taking low-dose aspirin every day.  Taking vitamin and mineral supplements as recommended by your health care provider. What happens during an annual well check? The services and screenings done by your health care provider during your annual well check will depend on your age, overall health, lifestyle risk factors, and family history of disease. Counseling  Your health care provider may ask you questions about your:  Alcohol use.  Tobacco use.  Drug use.  Emotional well-being.  Home and relationship well-being.  Sexual activity.  Eating habits.  History of falls.  Memory and ability to understand (cognition).  Work and work Statistician.  Reproductive health. Screening  You may have the following tests or measurements:  Height, weight, and BMI.  Blood pressure.  Lipid and cholesterol levels. These may be checked every 5 years, or more frequently if you are over 54 years old.  Skin check.  Lung cancer screening. You may have this screening every year starting at age 25 if you have a 30-pack-year history of smoking and currently smoke or have quit within the past 15 years.  Fecal occult blood test (FOBT) of the stool. You may have this test every year starting at age 68.  Flexible sigmoidoscopy or colonoscopy. You may have a sigmoidoscopy every 5 years or a colonoscopy every 10 years  starting at age 42.  Hepatitis C blood test.  Hepatitis B blood test.  Sexually transmitted disease (STD) testing.  Diabetes screening. This is done by checking your blood sugar (glucose) after you have not eaten for a while (fasting). You may have this done every 1-3 years.  Bone density scan. This is done to screen for  osteoporosis. You may have this done starting at age 29.  Mammogram. This may be done every 1-2 years. Talk to your health care provider about how often you should have regular mammograms. Talk with your health care provider about your test results, treatment options, and if necessary, the need for more tests. Vaccines  Your health care provider may recommend certain vaccines, such as:  Influenza vaccine. This is recommended every year.  Tetanus, diphtheria, and acellular pertussis (Tdap, Td) vaccine. You may need a Td booster every 10 years.  Zoster vaccine. You may need this after age 39.  Pneumococcal 13-valent conjugate (PCV13) vaccine. One dose is recommended after age 25.  Pneumococcal polysaccharide (PPSV23) vaccine. One dose is recommended after age 34. Talk to your health care provider about which screenings and vaccines you need and how often you need them. This information is not intended to replace advice given to you by your health care provider. Make sure you discuss any questions you have with your health care provider. Document Released: 02/11/2015 Document Revised: 10/05/2015 Document Reviewed: 11/16/2014 Elsevier Interactive Patient Education  2017 [place] Prevention in the Home Falls can cause injuries. They can happen to people of all ages. There are many things you can do to make your home safe and to help prevent falls. What can I do on the outside of my home?  Regularly fix the edges of walkways and driveways and fix any cracks.  Remove anything that might make you trip as you walk through a door, such as a raised step or threshold.  Trim any bushes or trees on the path to your home.  Use bright outdoor lighting.  Clear any walking paths of anything that might make someone trip, such as rocks or tools.  Regularly check to see if handrails are loose or broken. Make sure that both sides of any steps have handrails.  Any raised decks and porches  should have guardrails on the edges.  Have any leaves, snow, or ice cleared regularly.  Use sand or salt on walking paths during winter.  Clean up any spills in your garage right away. This includes oil or grease spills. What can I do in the bathroom?  Use night lights.  Install grab bars by the toilet and in the tub and shower. Do not use towel bars as grab bars.  Use non-skid mats or decals in the tub or shower.  If you need to sit down in the shower, use a plastic, non-slip stool.  Keep the floor dry. Clean up any water that spills on the floor as soon as it happens.  Remove soap buildup in the tub or shower regularly.  Attach bath mats securely with double-sided non-slip rug tape.  Do not have throw rugs and other things on the floor that can make you trip. What can I do in the bedroom?  Use night lights.  Make sure that you have a light by your bed that is easy to reach.  Do not use any sheets or blankets that are too big for your bed. They should not hang down onto the floor.  Have a  firm chair that has side arms. You can use this for support while you get dressed.  Do not have throw rugs and other things on the floor that can make you trip. What can I do in the kitchen?  Clean up any spills right away.  Avoid walking on wet floors.  Keep items that you use a lot in easy-to-reach places.  If you need to reach something above you, use a strong step stool that has a grab bar.  Keep electrical cords out of the way.  Do not use floor polish or wax that makes floors slippery. If you must use wax, use non-skid floor wax.  Do not have throw rugs and other things on the floor that can make you trip. What can I do with my stairs?  Do not leave any items on the stairs.  Make sure that there are handrails on both sides of the stairs and use them. Fix handrails that are broken or loose. Make sure that handrails are as long as the stairways.  Check any carpeting to  make sure that it is firmly attached to the stairs. Fix any carpet that is loose or worn.  Avoid having throw rugs at the top or bottom of the stairs. If you do have throw rugs, attach them to the floor with carpet tape.  Make sure that you have a light switch at the top of the stairs and the bottom of the stairs. If you do not have them, ask someone to add them for you. What else can I do to help prevent falls?  Wear shoes that:  Do not have high heels.  Have rubber bottoms.  Are comfortable and fit you well.  Are closed at the toe. Do not wear sandals.  If you use a stepladder:  Make sure that it is fully opened. Do not climb a closed stepladder.  Make sure that both sides of the stepladder are locked into place.  Ask someone to hold it for you, if possible.  Clearly mark and make sure that you can see:  Any grab bars or handrails.  First and last steps.  Where the edge of each step is.  Use tools that help you move around (mobility aids) if they are needed. These include:  Canes.  Walkers.  Scooters.  Crutches.  Turn on the lights when you go into a dark area. Replace any light bulbs as soon as they burn out.  Set up your furniture so you have a clear path. Avoid moving your furniture around.  If any of your floors are uneven, fix them.  If there are any pets around you, be aware of where they are.  Review your medicines with your doctor. Some medicines can make you feel dizzy. This can increase your chance of falling. Ask your doctor what other things that you can do to help prevent falls. This information is not intended to replace advice given to you by your health care provider. Make sure you discuss any questions you have with your health care provider. Document Released: 11/11/2008 Document Revised: 06/23/2015 Document Reviewed: 02/19/2014 Elsevier Interactive Patient Education  2017 [employer].

## 2020-03-21 ENCOUNTER — Encounter: Payer: Self-pay | Admitting: Anesthesiology

## 2020-03-21 ENCOUNTER — Ambulatory Visit
Admission: RE | Admit: 2020-03-21 | Discharge: 2020-03-21 | Disposition: A | Discharge status: Home / Self Care | Payer: Medicare Other | Attending: Gastroenterology | Admitting: Gastroenterology

## 2020-03-21 ENCOUNTER — Ambulatory Visit: Payer: Medicare Other | Admitting: Anesthesiology

## 2020-03-21 ENCOUNTER — Encounter
Admission: RE | Disposition: A | Discharge status: Home / Self Care | Payer: Self-pay | Source: Home / Self Care | Attending: Gastroenterology

## 2020-03-21 DIAGNOSIS — Z1211 Encounter for screening for malignant neoplasm of colon: Secondary | ICD-10-CM | POA: Diagnosis not present

## 2020-03-21 DIAGNOSIS — Z933 Colostomy status: Secondary | ICD-10-CM | POA: Insufficient documentation

## 2020-03-21 DIAGNOSIS — Z85048 Personal history of other malignant neoplasm of rectum, rectosigmoid junction, and anus: Secondary | ICD-10-CM | POA: Diagnosis not present

## 2020-03-21 DIAGNOSIS — Z79899 Other long term (current) drug therapy: Secondary | ICD-10-CM | POA: Insufficient documentation

## 2020-03-21 DIAGNOSIS — E7800 Pure hypercholesterolemia, unspecified: Secondary | ICD-10-CM | POA: Diagnosis not present

## 2020-03-21 DIAGNOSIS — Z923 Personal history of irradiation: Secondary | ICD-10-CM | POA: Insufficient documentation

## 2020-03-21 DIAGNOSIS — Z7982 Long term (current) use of aspirin: Secondary | ICD-10-CM | POA: Diagnosis not present

## 2020-03-21 DIAGNOSIS — Z791 Long term (current) use of non-steroidal anti-inflammatories (NSAID): Secondary | ICD-10-CM | POA: Insufficient documentation

## 2020-03-21 DIAGNOSIS — K5730 Diverticulosis of large intestine without perforation or abscess without bleeding: Secondary | ICD-10-CM | POA: Insufficient documentation

## 2020-03-21 DIAGNOSIS — Z9221 Personal history of antineoplastic chemotherapy: Secondary | ICD-10-CM | POA: Insufficient documentation

## 2020-03-21 DIAGNOSIS — Z7983 Long term (current) use of bisphosphonates: Secondary | ICD-10-CM | POA: Insufficient documentation

## 2020-03-21 DIAGNOSIS — I739 Peripheral vascular disease, unspecified: Secondary | ICD-10-CM | POA: Diagnosis not present

## 2020-03-21 DIAGNOSIS — Z85038 Personal history of other malignant neoplasm of large intestine: Secondary | ICD-10-CM | POA: Diagnosis not present

## 2020-03-21 HISTORY — PX: COLONOSCOPY WITH PROPOFOL: SHX5780

## 2020-03-21 SURGERY — COLONOSCOPY WITH PROPOFOL
Anesthesia: General

## 2020-03-21 MED ORDER — SODIUM CHLORIDE 0.9 % IV SOLN
INTRAVENOUS | Status: DC
  Administered 2020-03-21: 20 mL/h via INTRAVENOUS

## 2020-03-21 MED ORDER — PROPOFOL 500 MG/50ML IV EMUL
INTRAVENOUS | Status: AC
  Filled 2020-03-21: qty 50

## 2020-03-21 MED ORDER — PROPOFOL 500 MG/50ML IV EMUL
INTRAVENOUS | Status: DC | PRN
  Administered 2020-03-21: 100 ug/kg/min via INTRAVENOUS

## 2020-03-21 NOTE — Anesthesia Preprocedure Evaluation (Signed)
Anesthesia Evaluation  Patient identified by MRN, date of birth, ID band Patient awake    Reviewed: Allergy & Precautions, H&P , NPO status , Patient's Chart, lab work & pertinent test results  History of Anesthesia Complications Negative for: history of anesthetic complications  Airway Mallampati: III  TM Distance: <3 FB Neck ROM: limited    Dental  (+) Chipped   Pulmonary neg pulmonary ROS, neg shortness of breath,    Pulmonary exam normal        Cardiovascular Exercise Tolerance: Good hypertension, (-) angina+ Peripheral Vascular Disease  (-) Past MI Normal cardiovascular exam     Neuro/Psych  Neuromuscular disease negative psych ROS   GI/Hepatic Neg liver ROS, GERD  Medicated and Controlled,  Endo/Other  negative endocrine ROS  Renal/GU Renal disease  negative genitourinary   Musculoskeletal   Abdominal   Peds  Hematology negative hematology ROS (+)   Anesthesia Other Findings Past Medical History: No date: Colon cancer ([place]) No date: History of shingles     Comment:  twice No date: Hypercholesteremia No date: Hypertension No date: Left carotid bruit No date: Nephrolithiasis No date: Neuropathy 2014: Personal history of chemotherapy     Comment:  Colon 2014: Personal history of radiation therapy     Comment:  Colon 03/06/2013: Urine incontinence     Comment:  h/o  Past Surgical History: 1979: APPENDECTOMY     Comment:  colorectal No date: CESAREAN SECTION 03/21/2015: COLONOSCOPY WITH PROPOFOL; N/A     Comment:  Procedure: COLONOSCOPY WITH PROPOFOL;  Surgeon: Hulen Luster, MD;  Location: [facility] ENDOSCOPY;  Service:               Gastroenterology;  Laterality: N/A; No date: COLOSTOMY No date: surgery for colorectal cancer [id number]: VENTRAL HERNIA REPAIR; Right  BMI    Body Mass Index: 23.91 kg/m      Reproductive/Obstetrics negative OB ROS                              Anesthesia Physical Anesthesia Plan  ASA: III  Anesthesia Plan: General   Post-op Pain Management:    Induction: Intravenous  PONV Risk Score and Plan: Propofol infusion and TIVA  Airway Management Planned: Natural Airway and Nasal Cannula  Additional Equipment:   Intra-op Plan:   Post-operative Plan:   Informed Consent: I have reviewed the patients History and Physical, chart, labs and discussed the procedure including the risks, benefits and alternatives for the proposed anesthesia with the patient or authorized representative who has indicated his/her understanding and acceptance.     Dental Advisory Given  Plan Discussed with: Anesthesiologist, CRNA and Surgeon  Anesthesia Plan Comments: (Patient consented for risks of anesthesia including but not limited to:  - adverse reactions to medications - risk of airway placement if required - damage to eyes, teeth, lips or other oral mucosa - nerve damage due to positioning  - sore throat or hoarseness - Damage to heart, brain, nerves, lungs, other parts of body or loss of life  Patient voiced understanding.)        Anesthesia Quick Evaluation

## 2020-03-21 NOTE — Transfer of Care (Signed)
Immediate Anesthesia Transfer of Care Note  Patient: Christina Mathis  Procedure(s) Performed: COLONOSCOPY WITH PROPOFOL (N/A )  Patient Location: PACU  Anesthesia Type:General  Level of Consciousness: awake and sedated  Airway & Oxygen Therapy: Patient Spontanous Breathing and Patient connected to nasal cannula oxygen  Post-op Assessment: Report given to RN and Post -op Vital signs reviewed and stable  Post vital signs: Reviewed and stable  Last Vitals:  Vitals Value Taken Time  BP    Temp    Pulse    Resp    SpO2      Last Pain:  Vitals:   03/21/20 0726  TempSrc: Temporal  PainSc: 0-No pain         Complications: No complications documented.

## 2020-03-21 NOTE — Interval H&P Note (Signed)
History and Physical Interval Note:  03/21/2020 7:55 AM  Christina Mathis  has presented today for surgery, with the diagnosis of HX.OF COLON CANCER.  The various methods of treatment have been discussed with the patient and family. After consideration of risks, benefits and other options for treatment, the patient has consented to  Procedure(s): COLONOSCOPY WITH PROPOFOL (N/A) as a surgical intervention.  The patient's history has been reviewed, patient examined, no change in status, stable for surgery.  I have reviewed the patient's chart and labs.  Questions were answered to the patient's satisfaction.     Lesly Rubenstein  Ok to proceed with colonoscopy

## 2020-03-21 NOTE — Op Note (Signed)
[facility] Gastroenterology Patient Name: Christina Mathis Procedure Date: 03/21/2020 7:38 AM MRN: [id number] Account #: [id number] Date of Birth: [birth year] Admit Type: Outpatient Age: 85 Room: [facility] ENDO ROOM 3 Gender: Female Note Status: Finalized Procedure:             Colonoscopy Indications:           High risk colon cancer surveillance: Personal history                         of colon cancer Providers:             Andrey Farmer MD, MD Medicines:             Monitored Anesthesia Care Complications:         No immediate complications. Procedure:             Pre-Anesthesia Assessment:                        - Prior to the procedure, a History and Physical was                         performed, and patient medications and allergies were                         reviewed. The patient is competent. The risks and                         benefits of the procedure and the sedation options and                         risks were discussed with the patient. All questions                         were answered and informed consent was obtained.                         Patient identification and proposed procedure were                         verified by the physician, the nurse, the anesthetist                         and the technician in the pre-procedure area in the                         endoscopy suite. Mental Status Examination: alert and                         oriented. Airway Examination: normal oropharyngeal                         airway and neck mobility. Respiratory Examination:                         clear to auscultation. CV Examination: normal.                         Prophylactic Antibiotics: The patient does not require  prophylactic antibiotics. Prior Anticoagulants: The                         patient has taken no previous anticoagulant or                         antiplatelet agents. ASA Grade Assessment: III - A                          patient with severe systemic disease. After reviewing                         the risks and benefits, the patient was deemed in                         satisfactory condition to undergo the procedure. The                         anesthesia plan was to use monitored anesthesia care                         (MAC). Immediately prior to administration of                         medications, the patient was re-assessed for adequacy                         to receive sedatives. The heart rate, respiratory                         rate, oxygen saturations, blood pressure, adequacy of                         pulmonary ventilation, and response to care were                         monitored throughout the procedure. The physical                         status of the patient was re-assessed after the                         procedure.                        After obtaining informed consent, the colonoscope was                         passed under direct vision. Throughout the procedure,                         the patient's blood pressure, pulse, and oxygen                         saturations were monitored continuously. The                         Colonoscope was introduced through the ostomy and  advanced to the the cecum, identified by appendiceal                         orifice and ileocecal valve. The colonoscopy was                         performed without difficulty. The patient tolerated                         the procedure well. The quality of the bowel                         preparation was good. Findings:      There was evidence of a widely patent end colostomy in the recto-sigmoid       colon. This was characterized by healthy appearing mucosa.      A few small-mouthed diverticula were found in the sigmoid colon,       descending colon, transverse colon and ascending colon.      Retroflexion at the surgical stoma was not performed due to  anatomy. Impression:            - Widely patent end colostomy with healthy appearing                         mucosa in the recto-sigmoid colon.                        - Diverticulosis in the sigmoid colon, in the                         descending colon, in the transverse colon and in the                         ascending colon.                        - No specimens collected. Recommendation:        - Discharge patient to home.                        - Resume previous diet.                        - Continue present medications.                        - Repeat colonoscopy in 5 years for surveillance if                         benefits outweigh risks given age.                        - Return to referring physician as previously                         scheduled. Procedure Code(s):     --- Professional ---                        G8115, Colorectal cancer screening; colonoscopy on  individual at high risk Diagnosis Code(s):     --- Professional ---                        [phone], Personal history of other malignant neoplasm                         of large intestine                        Z93.3, Colostomy status                        K57.30, Diverticulosis of large intestine without                         perforation or abscess without bleeding CPT copyright 2019 [employer]. All rights reserved. The codes documented in this report are preliminary and upon coder review may  be revised to meet current compliance requirements. Andrey Farmer MD, MD 03/21/2020 8:21:55 AM Number of Addenda: 0 Note Initiated On: 03/21/2020 7:38 AM Scope Withdrawal Time: 0 hours 5 minutes 1 second  Total Procedure Duration: 0 hours 10 minutes 55 seconds  Estimated Blood Loss:  Estimated blood loss: none.      [facility]

## 2020-03-21 NOTE — H&P (Signed)
Outpatient short stay form Pre-procedure 03/21/2020 7:53 AM Raylene Miyamoto MD, MPH  Primary Physician: Dr. Nicki Reaper  Reason for visit:  History of colon cancer  History of present illness:   85 y/o lady with history of rectal cancer with ostomy. No blood thinners, no family history of GI malignancies. History of colostomy due to colon cancer. No new GI symptoms.    Current Facility-Administered Medications:  .  0.9 %  sodium chloride infusion, , Intravenous, Continuous, Cailin Gebel, Hilton Cork, MD, Last Rate: 20 mL/hr at 03/21/20 0739, 20 mL/hr at 03/21/20 8341  Facility-Administered Medications Ordered in Other Encounters:  .  sodium chloride flush (NS) 0.9 % injection 10 mL, 10 mL, Intravenous, PRN, Choksi, Janak, MD, 10 mL at 05/31/15 1040 .  sodium chloride flush (NS) 0.9 % injection 10 mL, 10 mL, Intravenous, PRN, Choksi, Janak, MD, 10 mL at 07/12/15 1018  Medications Prior to Admission  Medication Sig Dispense Refill Last Dose  . alendronate (FOSAMAX) 70 MG tablet TAKE 1 TABLET BY MOUTH 1 TIME A WEEK WITH A FULL GLASS OF WATER AND ON AN EMPTY STOMACH 4 tablet 11 Past Week at Unknown time  . Alpha-Lipoic Acid 100 MG CAPS Take 1 capsule by mouth daily.   Past Week at Unknown time  . aspirin EC 81 MG tablet Take 81 mg by mouth daily.   Past Week at Unknown time  . Biotin 1000 MCG tablet Take 1,000 mcg by mouth 2 (two) times daily.    Past Week at Unknown time  . calcium citrate-vitamin D (CITRACAL+D) 315-200 MG-UNIT per tablet Take 1 tablet by mouth 2 (two) times daily.   Past Week at Unknown time  . Cholecalciferol (VITAMIN D3) 5000 units CAPS Take 1 capsule by mouth daily.   Past Week at Unknown time  . glucosamine-chondroitin 500-400 MG tablet Take 1 tablet by mouth 2 (two) times daily.   Past Week at Unknown time  . loratadine (CLARITIN) 10 MG tablet Take 10 mg by mouth daily.   Past Week at Unknown time  . losartan-hydrochlorothiazide (HYZAAR) 50-12.5 MG tablet Take 1 tablet by mouth  daily. 90 tablet 3 03/20/2020 at Unknown time  . lovastatin (MEVACOR) 10 MG tablet TAKE 1 TABLET(10 MG) BY MOUTH AT BEDTIME 90 tablet 1 Past Week at Unknown time  . meloxicam (MOBIC) 7.5 MG tablet Take 7.5 mg by mouth daily.   Past Week at Unknown time  . Multiple Vitamin (MULTI-VITAMINS) TABS Take 1 tablet by mouth daily.    Past Week at Unknown time  . B Complex-C (SUPER B COMPLEX PO) Take 1 tablet by mouth daily. (Patient not taking: Reported on 03/18/2020)        No Known Allergies   Past Medical History:  Diagnosis Date  . Colon cancer ([place])   . History of shingles    twice  . Hypercholesteremia   . Hypertension   . Left carotid bruit   . Nephrolithiasis   . Neuropathy   . Personal history of chemotherapy 2014   Colon  . Personal history of radiation therapy 2014   Colon  . Urine incontinence 03/06/2013   h/o    Review of systems:  Otherwise negative.    Physical Exam  Gen: Alert, oriented. Appears stated age.  HEENT: PERRLA. Lungs: No respiratory distress CV: RRR Abd: soft, benign, no masses Ext: No edema    Planned procedures: Proceed with colonoscopy. The patient understands the nature of the planned procedure, indications, risks, alternatives and potential complications including  but not limited to bleeding, infection, perforation, damage to internal organs and possible oversedation/side effects from anesthesia. The patient agrees and gives consent to proceed.  Please refer to procedure notes for findings, recommendations and patient disposition/instructions.     Raylene Miyamoto MD, MPH Gastroenterology 03/21/2020  7:53 AM

## 2020-03-21 NOTE — Anesthesia Procedure Notes (Signed)
Performed by: Cook-Martin, Janmarie Smoot Pre-anesthesia Checklist: Patient identified, Emergency Drugs available, Suction available, Patient being monitored and Timeout performed Patient Re-evaluated:Patient Re-evaluated prior to induction Oxygen Delivery Method: Nasal cannula Preoxygenation: Pre-oxygenation with 100% oxygen Induction Type: IV induction Placement Confirmation: positive ETCO2 and CO2 detector       

## 2020-03-21 NOTE — Anesthesia Postprocedure Evaluation (Signed)
Anesthesia Post Note  Patient: Christina Mathis  Procedure(s) Performed: COLONOSCOPY WITH PROPOFOL (N/A )  Patient location during evaluation: Endoscopy Anesthesia Type: General Level of consciousness: awake and alert Pain management: pain level controlled Vital Signs Assessment: post-procedure vital signs reviewed and stable Respiratory status: spontaneous breathing, nonlabored ventilation, respiratory function stable and patient connected to nasal cannula oxygen Cardiovascular status: blood pressure returned to baseline and stable Postop Assessment: no apparent nausea or vomiting Anesthetic complications: no   No complications documented.   Last Vitals:  Vitals:   03/21/20 0834 03/21/20 0844  BP: 127/66 131/61  Pulse: 68 61  Resp: 15 15  Temp:    SpO2: 100% 100%    Last Pain:  Vitals:   03/21/20 0844  TempSrc:   PainSc: 0-No pain                 Precious Haws Essam Lowdermilk

## 2020-03-22 ENCOUNTER — Encounter: Payer: Self-pay | Admitting: Gastroenterology

## 2020-04-13 ENCOUNTER — Other Ambulatory Visit: Payer: Self-pay

## 2020-04-13 ENCOUNTER — Other Ambulatory Visit (INDEPENDENT_AMBULATORY_CARE_PROVIDER_SITE_OTHER): Payer: Medicare Other

## 2020-04-13 DIAGNOSIS — R739 Hyperglycemia, unspecified: Secondary | ICD-10-CM | POA: Diagnosis not present

## 2020-04-13 DIAGNOSIS — E7800 Pure hypercholesterolemia, unspecified: Secondary | ICD-10-CM | POA: Diagnosis not present

## 2020-04-13 LAB — BASIC METABOLIC PANEL
BUN: 25 mg/dL — ABNORMAL HIGH (ref 6–23)
CO2: 30 mEq/L (ref 19–32)
Calcium: 9.4 mg/dL (ref 8.4–10.5)
Chloride: 103 mEq/L (ref 96–112)
Creatinine, Ser: 0.98 mg/dL (ref 0.40–1.20)
GFR: 53.73 mL/min — ABNORMAL LOW (ref >60.00)
Glucose, Bld: 116 mg/dL — ABNORMAL HIGH (ref 70–99)
Potassium: 4.5 mEq/L (ref 3.5–5.1)
Sodium: 141 mEq/L (ref 135–145)

## 2020-04-13 LAB — LIPID PANEL
Cholesterol: 100 mg/dL (ref 0–200)
HDL: 39.2 mg/dL (ref >39.00)
LDL Cholesterol: 46 mg/dL (ref 0–99)
NonHDL: 60.59
Total CHOL/HDL Ratio: 3
Triglycerides: 74 mg/dL (ref 0.0–149.0)
VLDL: 14.8 mg/dL (ref 0.0–40.0)

## 2020-04-13 LAB — HEPATIC FUNCTION PANEL
ALT: 27 U/L (ref 0–35)
AST: 32 U/L (ref 0–37)
Albumin: 3.8 g/dL (ref 3.5–5.2)
Alkaline Phosphatase: 46 U/L (ref 39–117)
Bilirubin, Direct: 0.2 mg/dL (ref 0.0–0.3)
Total Bilirubin: 1 mg/dL (ref 0.2–1.2)
Total Protein: 6.7 g/dL (ref 6.0–8.3)

## 2020-04-13 LAB — HEMOGLOBIN A1C: Hgb A1c MFr Bld: 5.6 % (ref 4.6–6.5)

## 2020-04-15 ENCOUNTER — Other Ambulatory Visit: Payer: Self-pay

## 2020-04-15 ENCOUNTER — Encounter: Payer: Self-pay | Admitting: Internal Medicine

## 2020-04-15 ENCOUNTER — Ambulatory Visit (INDEPENDENT_AMBULATORY_CARE_PROVIDER_SITE_OTHER): Payer: Medicare Other | Admitting: Internal Medicine

## 2020-04-15 DIAGNOSIS — I771 Stricture of artery: Secondary | ICD-10-CM | POA: Diagnosis not present

## 2020-04-15 DIAGNOSIS — R739 Hyperglycemia, unspecified: Secondary | ICD-10-CM

## 2020-04-15 DIAGNOSIS — J029 Acute pharyngitis, unspecified: Secondary | ICD-10-CM | POA: Diagnosis not present

## 2020-04-15 DIAGNOSIS — Z85048 Personal history of other malignant neoplasm of rectum, rectosigmoid junction, and anus: Secondary | ICD-10-CM

## 2020-04-15 DIAGNOSIS — Z933 Colostomy status: Secondary | ICD-10-CM

## 2020-04-15 DIAGNOSIS — I6523 Occlusion and stenosis of bilateral carotid arteries: Secondary | ICD-10-CM

## 2020-04-15 DIAGNOSIS — E7800 Pure hypercholesterolemia, unspecified: Secondary | ICD-10-CM

## 2020-04-15 NOTE — Progress Notes (Addendum)
[name], female   DOB: 09-20-1935, 85 y.o.   MRN: [id number]   Subjective:    [name], female    DOB: [birth year], 85 y.o.   MRN: [id number]  HPI This visit occurred during the SARS-CoV-2 public health emergency.  Safety protocols were in place, including screening questions prior to the visit, additional usage of staff PPE, and extensive cleaning of exam room while observing appropriate contact time as indicated for disinfecting solutions.  Patient here for a scheduled follow up.  Here to follow up regarding her cholesterol and blood sugar.  Has a history of rectal cancer.   Just evaluated by Dr Haig Prophet.  Colonoscopy - normal.  Followed by Dr Grayland Ormond.  Last CEA wnl.  She tries to stay active.  No chest pain or sob reported.  Does report that her son was sick this past week.  She started having symptoms 2-3 days ago.  Reports sore throat.  Increased congestion.  Nasal congestion and throat congestion.  No chest pain or sob.  No acid reflux reported.  Eating.  No vomiting.  Ostomy working well.    Past Medical History:  Diagnosis Date  . Colon cancer ([place])   . History of shingles    twice  . Hypercholesteremia   . Hypertension   . Left carotid bruit   . Nephrolithiasis   . Neuropathy   . Personal history of chemotherapy 2014   Colon  . Personal history of radiation therapy 2014   Colon  . Urine incontinence 03/06/2013   h/o   Past Surgical History:  Procedure Laterality Date  . APPENDECTOMY  1979   colorectal  . CESAREAN SECTION    . COLONOSCOPY WITH PROPOFOL N/A 03/21/2015   Procedure: COLONOSCOPY WITH PROPOFOL;  Surgeon: Hulen Luster, MD;  Location: [facility] ENDOSCOPY;  Service: Gastroenterology;  Laterality: N/A;  . COLONOSCOPY WITH PROPOFOL N/A 03/21/2020   Procedure: COLONOSCOPY WITH PROPOFOL;  Surgeon: Lesly Rubenstein, MD;  Location: [facility] ENDOSCOPY;  Service: Endoscopy;  Laterality: N/A;  . COLOSTOMY    . surgery for colorectal cancer    .  VENTRAL HERNIA REPAIR Right [phone]   Family History  Problem Relation Age of Onset  . Hypertension Mother   . Hypertension Father   . Breast cancer Paternal Aunt    Social History   Socioeconomic History  . Marital status: Widowed    Spouse name: Not on file  . Number of children: Not on file  . Years of education: Not on file  . Highest education level: Not on file  Occupational History  . Not on file  Tobacco Use  . Smoking status: Never Smoker  . Smokeless tobacco: Never Used  Vaping Use  . Vaping Use: Never used  Substance and Sexual Activity  . Alcohol use: No    Alcohol/week: 0.0 standard drinks  . Drug use: No  . Sexual activity: Not Currently  Other Topics Concern  . Not on file  Social History Narrative  . Not on file   Social Determinants of Health   Financial Resource Strain: Low Risk   . Difficulty of Paying Living Expenses: Not hard at all  Food Insecurity: No Food Insecurity  . Worried About Charity fundraiser in the Last Year: Never true  . Ran Out of Food in the Last Year: Never true  Transportation Needs: No Transportation Needs  . Lack of Transportation (Medical): No  . Lack of Transportation (Non-Medical): No  Physical Activity:  Insufficiently Active  . Days of Exercise per Week: 4 days  . Minutes of Exercise per Session: 30 min  Stress: No Stress Concern Present  . Feeling of Stress : Not at all  Social Connections: Unknown  . Frequency of Communication with Friends and Family: More than three times a week  . Frequency of Social Gatherings with Friends and Family: Not on file  . Attends Religious Services: Not on file  . Active Member of Clubs or Organizations: Yes  . Attends Archivist Meetings: Not on file  . Marital Status: Not on file    Outpatient Encounter Medications as of 04/15/2020  Medication Sig  . alendronate (FOSAMAX) 70 MG tablet TAKE 1 TABLET BY MOUTH 1 TIME A WEEK WITH A FULL GLASS OF WATER AND ON AN EMPTY  STOMACH  . aspirin EC 81 MG tablet Take 81 mg by mouth daily.  . Biotin 1000 MCG tablet Take 1,000 mcg by mouth 2 (two) times daily.   . calcium citrate-vitamin D (CITRACAL+D) 315-200 MG-UNIT per tablet Take 1 tablet by mouth 2 (two) times daily.  . Cholecalciferol (VITAMIN D3) 5000 units CAPS Take 1 capsule by mouth daily.  Marland Kitchen glucosamine-chondroitin 500-400 MG tablet Take 1 tablet by mouth 2 (two) times daily.  Marland Kitchen loratadine (CLARITIN) 10 MG tablet Take 10 mg by mouth daily.  Marland Kitchen losartan-hydrochlorothiazide (HYZAAR) 50-12.5 MG tablet Take 1 tablet by mouth daily.  Marland Kitchen lovastatin (MEVACOR) 10 MG tablet TAKE 1 TABLET(10 MG) BY MOUTH AT BEDTIME  . Multiple Vitamin (MULTI-VITAMINS) TABS Take 1 tablet by mouth daily.   . [DISCONTINUED] Alpha-Lipoic Acid 100 MG CAPS Take 1 capsule by mouth daily.  . [DISCONTINUED] B Complex-C (SUPER B COMPLEX PO) Take 1 tablet by mouth daily. (Patient not taking: Reported on 03/18/2020)  . [DISCONTINUED] meloxicam (MOBIC) 7.5 MG tablet Take 7.5 mg by mouth daily.   Facility-Administered Encounter Medications as of 04/15/2020  Medication  . sodium chloride flush (NS) 0.9 % injection 10 mL  . sodium chloride flush (NS) 0.9 % injection 10 mL    Review of Systems  Constitutional: Negative for appetite change and unexpected weight change.  HENT: Positive for congestion and sore throat.   Respiratory: Negative for chest tightness and shortness of breath.   Cardiovascular: Negative for chest pain, palpitations and leg swelling.  Gastrointestinal: Negative for abdominal pain, nausea and vomiting.  Genitourinary: Negative for difficulty urinating and dysuria.  Musculoskeletal: Negative for joint swelling and myalgias.  Skin: Negative for color change and rash.  Neurological: Negative for dizziness, light-headedness and headaches.  Psychiatric/Behavioral: Negative for agitation and dysphoric mood.       Objective:    Physical Exam Vitals reviewed.  Constitutional:       General: She is not in acute distress.    Appearance: Normal appearance.  HENT:     Head: Normocephalic and atraumatic.     Right Ear: External ear normal.     Left Ear: External ear normal.  Eyes:     General: No scleral icterus.       Right eye: No discharge.        Left eye: No discharge.     Conjunctiva/sclera: Conjunctivae normal.  Neck:     Thyroid: No thyromegaly.  Cardiovascular:     Rate and Rhythm: Normal rate and regular rhythm.  Pulmonary:     Effort: No respiratory distress.     Breath sounds: Normal breath sounds. No wheezing.  Abdominal:     General:  Bowel sounds are normal.     Palpations: Abdomen is soft.     Tenderness: There is no abdominal tenderness.  Musculoskeletal:        General: No swelling or tenderness.     Cervical back: Neck supple. No tenderness.  Lymphadenopathy:     Cervical: No cervical adenopathy.  Skin:    Findings: No erythema or rash.  Neurological:     Mental Status: She is alert.  Psychiatric:        Mood and Affect: Mood normal.        Behavior: Behavior normal.     BP 138/68   Pulse 73   Temp 97.9 F (36.6 C) (Oral)   Ht $R'5\' 3"'GA$  (1.6 m)   Wt 138 lb (62.6 kg)   SpO2 98%   BMI 24.45 kg/m  Wt Readings from Last 3 Encounters:  04/15/20 138 lb (62.6 kg)  03/21/20 135 lb (61.2 kg)  03/18/20 141 lb (64 kg)     Lab Results  Component Value Date   WBC 6.4 08/17/2019   HGB 12.6 08/17/2019   HCT 36.9 08/17/2019   PLT 260 08/17/2019   GLUCOSE 116 (H) 04/13/2020   CHOL 100 04/13/2020   TRIG 74.0 04/13/2020   HDL 39.20 04/13/2020   LDLCALC 46 04/13/2020   ALT 27 04/13/2020   AST 32 04/13/2020   NA 141 04/13/2020   K 4.5 04/13/2020   CL 103 04/13/2020   CREATININE 0.98 04/13/2020   BUN 25 (H) 04/13/2020   CO2 30 04/13/2020   TSH 3.04 07/27/2019   INR 1.1 10/21/2012   HGBA1C 5.6 04/13/2020   MICROALBUR 1.1 10/19/2016       Assessment & Plan:   Problem List Items Addressed This Visit    Carotid artery  disease ([place])    Saw [facility].  Last evaluated 06/2019.  Carotid ultrasound - left 1-39% and right no significant stenosis.  Recommended f/u in 12 months.       Colostomy status ([place])    Ostomy working well.  Follow.       History of rectal cancer    S/p chemo, XRT and colectomy.  Just had colonoscopy - ok.  Followed by Dr Grayland Ormond.  Ostomy working well.        Hypercholesterolemia    On lovastatin.  Low cholesterol diet and exercise.  Follow lipid panel and liver function tests.   Lab Results  Component Value Date   CHOL 100 04/13/2020   HDL 39.20 04/13/2020   LDLCALC 46 04/13/2020   TRIG 74.0 04/13/2020   CHOLHDL 3 04/13/2020        Relevant Orders   Hepatic function panel   Lipid panel   TSH   Basic metabolic panel   Hyperglycemia    Low carb diet and exercise.  Follow met b and a1c.   Lab Results  Component Value Date   HGBA1C 5.6 04/13/2020        Relevant Orders   Hemoglobin A1c   Sore throat    Congestion and sore throat.  Discussed robitussin/mucinex and nasacort nasal spray as directed.  No sob.  No chest tightness.  Nasal swab to r/u covid.        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  Last evaluated 06/2019.            Einar Pheasant,  MD 

## 2020-04-16 ENCOUNTER — Encounter: Payer: Self-pay | Admitting: Internal Medicine

## 2020-04-16 ENCOUNTER — Telehealth: Payer: Self-pay | Admitting: Internal Medicine

## 2020-04-16 LAB — SARS-COV-2, NAA 2 DAY TAT

## 2020-04-16 LAB — NOVEL CORONAVIRUS, NAA: SARS-CoV-2, NAA: NOT DETECTED

## 2020-04-16 NOTE — Assessment & Plan Note (Signed)
Ostomy working well.  Follow.  

## 2020-04-16 NOTE — Assessment & Plan Note (Signed)
Congestion and sore throat.  Discussed robitussin/mucinex and nasacort nasal spray as directed.  No sob.  No chest tightness.  Nasal swab to r/u covid.

## 2020-04-16 NOTE — Assessment & Plan Note (Signed)
S/p chemo, XRT and colectomy.  Just had colonoscopy - ok.  Followed by Dr Grayland Ormond.  Ostomy working well.

## 2020-04-16 NOTE — Assessment & Plan Note (Signed)
Low carb diet and exercise.  Follow met b and a1c.   Lab Results  Component Value Date   HGBA1C 5.6 04/13/2020

## 2020-04-16 NOTE — Telephone Encounter (Signed)
Please schedule f/u appt in 4 months and fasting labs 1-2 days before f/u appt.  Thanks

## 2020-04-16 NOTE — Assessment & Plan Note (Signed)
Saw [facility].  Last evaluated 06/2019.  Carotid ultrasound - left 1-39% and right no significant stenosis.  Recommended f/u in 12 months.

## 2020-04-16 NOTE — Assessment & Plan Note (Signed)
On lovastatin.  Low cholesterol diet and exercise.  Follow lipid panel and liver function tests.   Lab Results  Component Value Date   CHOL 100 04/13/2020   HDL 39.20 04/13/2020   LDLCALC 46 04/13/2020   TRIG 74.0 04/13/2020   CHOLHDL 3 04/13/2020

## 2020-04-16 NOTE — Assessment & Plan Note (Signed)
Followed by [facility].  Last evaluated 06/2019.

## 2020-04-18 NOTE — Telephone Encounter (Signed)
Called and scheduled appt

## 2020-04-21 DIAGNOSIS — Z85038 Personal history of other malignant neoplasm of large intestine: Secondary | ICD-10-CM | POA: Diagnosis not present

## 2020-04-21 DIAGNOSIS — Z933 Colostomy status: Secondary | ICD-10-CM | POA: Diagnosis not present

## 2020-07-06 DIAGNOSIS — D2262 Melanocytic nevi of left upper limb, including shoulder: Secondary | ICD-10-CM | POA: Diagnosis not present

## 2020-07-06 DIAGNOSIS — L821 Other seborrheic keratosis: Secondary | ICD-10-CM | POA: Diagnosis not present

## 2020-07-06 DIAGNOSIS — D225 Melanocytic nevi of trunk: Secondary | ICD-10-CM | POA: Diagnosis not present

## 2020-07-06 DIAGNOSIS — D2261 Melanocytic nevi of right upper limb, including shoulder: Secondary | ICD-10-CM | POA: Diagnosis not present

## 2020-07-14 ENCOUNTER — Other Ambulatory Visit: Payer: Self-pay | Admitting: Internal Medicine

## 2020-07-15 ENCOUNTER — Telehealth: Payer: Self-pay

## 2020-07-15 NOTE — Telephone Encounter (Signed)
I called and advised patient that Dr. Nicki Christina Mathis not Larena Glassman called patient. Pt just wanted to make sure.

## 2020-07-15 NOTE — Telephone Encounter (Signed)
Pt called in stating she missed a call from Dr. Nicki Reaper.

## 2020-07-25 ENCOUNTER — Ambulatory Visit (INDEPENDENT_AMBULATORY_CARE_PROVIDER_SITE_OTHER): Payer: Medicare Other | Admitting: Nurse Practitioner

## 2020-07-25 ENCOUNTER — Encounter (INDEPENDENT_AMBULATORY_CARE_PROVIDER_SITE_OTHER): Payer: Medicare Other

## 2020-07-26 DIAGNOSIS — Z933 Colostomy status: Secondary | ICD-10-CM | POA: Diagnosis not present

## 2020-07-26 DIAGNOSIS — Z85038 Personal history of other malignant neoplasm of large intestine: Secondary | ICD-10-CM | POA: Diagnosis not present

## 2020-08-03 ENCOUNTER — Other Ambulatory Visit (INDEPENDENT_AMBULATORY_CARE_PROVIDER_SITE_OTHER): Payer: Self-pay | Admitting: Nurse Practitioner

## 2020-08-03 DIAGNOSIS — I6523 Occlusion and stenosis of bilateral carotid arteries: Secondary | ICD-10-CM

## 2020-08-05 ENCOUNTER — Ambulatory Visit (INDEPENDENT_AMBULATORY_CARE_PROVIDER_SITE_OTHER): Payer: Medicare Other

## 2020-08-05 ENCOUNTER — Ambulatory Visit (INDEPENDENT_AMBULATORY_CARE_PROVIDER_SITE_OTHER): Payer: Medicare Other | Admitting: Nurse Practitioner

## 2020-08-05 ENCOUNTER — Other Ambulatory Visit: Payer: Self-pay

## 2020-08-05 VITALS — BP 123/62 | HR 70 | Resp 16 | Wt 137.0 lb

## 2020-08-05 DIAGNOSIS — E7800 Pure hypercholesterolemia, unspecified: Secondary | ICD-10-CM | POA: Diagnosis not present

## 2020-08-05 DIAGNOSIS — I771 Stricture of artery: Secondary | ICD-10-CM | POA: Diagnosis not present

## 2020-08-05 DIAGNOSIS — I6523 Occlusion and stenosis of bilateral carotid arteries: Secondary | ICD-10-CM

## 2020-08-05 DIAGNOSIS — I890 Lymphedema, not elsewhere classified: Secondary | ICD-10-CM

## 2020-08-06 ENCOUNTER — Encounter (INDEPENDENT_AMBULATORY_CARE_PROVIDER_SITE_OTHER): Payer: Self-pay | Admitting: Nurse Practitioner

## 2020-08-06 NOTE — Progress Notes (Signed)
Subjective:    [name], female    DOB: December 17, 1935, 85 y.o.   MRN: [id number] Chief Complaint  Patient presents with   Follow-up    Ultrasound follow up    The patient is seen for follow up evaluation of carotid stenosis. The carotid stenosis followed by ultrasound.   The patient denies amaurosis fugax. There is no recent history of TIA symptoms or focal motor deficits. There is no prior documented CVA.  The patient is taking enteric-coated aspirin 81 mg daily.  There is no history of migraine headaches. There is no history of seizures.  The patient has a history of coronary artery disease, no recent episodes of angina or shortness of breath. The patient denies PAD or claudication symptoms. There is a history of hyperlipidemia which is being treated with a statin.    Carotid Duplex done today shows 40 to 59%.  This is an increase from previous studies.  The left vertebral has antegrade flow in the right vertebral artery has retrograde flow.  Elevated velocities in the left subclavian   Review of Systems  Neurological:  Negative for dizziness and light-headedness.  All other systems reviewed and are negative.     Objective:   Physical Exam Vitals reviewed.  HENT:     Head: Normocephalic.  Neck:     Vascular: Carotid bruit present.  Cardiovascular:     Rate and Rhythm: Normal rate.     Pulses: Normal pulses.  Pulmonary:     Effort: Pulmonary effort is normal.  Neurological:     Mental Status: She is alert and oriented to person, place, and time.  Psychiatric:        Mood and Affect: Mood normal.        Behavior: Behavior normal.        Thought Content: Thought content normal.        Judgment: Judgment normal.    BP 123/62 (BP Location: Right Arm)   Pulse 70   Resp 16   Wt 137 lb (62.1 kg)   BMI 24.27 kg/m   Past Medical History:  Diagnosis Date   Colon cancer ([place])    History of shingles    twice   Hypercholesteremia    Hypertension     Left carotid bruit    Nephrolithiasis    Neuropathy    Personal history of chemotherapy 2014   Colon   Personal history of radiation therapy 2014   Colon   Urine incontinence 03/06/2013   h/o    Social History   Socioeconomic History   Marital status: Widowed    Spouse name: Not on file   Number of children: Not on file   Years of education: Not on file   Highest education level: Not on file  Occupational History   Not on file  Tobacco Use   Smoking status: Never   Smokeless tobacco: Never  Vaping Use   Vaping Use: Never used  Substance and Sexual Activity   Alcohol use: No    Alcohol/week: 0.0 standard drinks   Drug use: No   Sexual activity: Not Currently  Other Topics Concern   Not on file  Social History Narrative   Not on file   Social Determinants of Health   Financial Resource Strain: Low Risk    Difficulty of Paying Living Expenses: Not hard at all  Food Insecurity: No Food Insecurity   Worried About [place] in the Last Year: Never true  Ran Out of Food in the Last Year: Never true  Transportation Needs: No Transportation Needs   Lack of Transportation (Medical): No   Lack of Transportation (Non-Medical): No  Physical Activity: Insufficiently Active   Days of Exercise per Week: 4 days   Minutes of Exercise per Session: 30 min  Stress: No Stress Concern Present   Feeling of Stress : Not at all  Social Connections: Unknown   Frequency of Communication with Friends and Family: More than three times a week   Frequency of Social Gatherings with Friends and Family: Not on file   Attends Religious Services: Not on file   Active Member of Clubs or Organizations: Yes   Attends Archivist Meetings: Not on file   Marital Status: Not on file  Intimate Partner Violence: Not At Risk   Fear of Current or Ex-Partner: No   Emotionally Abused: No   Physically Abused: No   Sexually Abused: No    Past Surgical History:  Procedure Laterality  Date   APPENDECTOMY  1979   colorectal   CESAREAN SECTION     COLONOSCOPY WITH PROPOFOL N/A 03/21/2015   Procedure: COLONOSCOPY WITH PROPOFOL;  Surgeon: Hulen Luster, MD;  Location: [facility] ENDOSCOPY;  Service: Gastroenterology;  Laterality: N/A;   COLONOSCOPY WITH PROPOFOL N/A 03/21/2020   Procedure: COLONOSCOPY WITH PROPOFOL;  Surgeon: Lesly Rubenstein, MD;  Location: [facility] ENDOSCOPY;  Service: Endoscopy;  Laterality: N/A;   COLOSTOMY     surgery for colorectal cancer     VENTRAL HERNIA REPAIR Right [phone]    Family History  Problem Relation Age of Onset   Hypertension Mother    Hypertension Father    Breast cancer Paternal Aunt     No Known Allergies  CBC Latest Ref Rng & Units 08/17/2019 04/01/2019 08/13/2018  WBC 4.0 - 10.5 K/uL 6.4 6.0 7.9  Hemoglobin 12.0 - 15.0 g/dL 12.6 12.7 12.4  Hematocrit 36.0 - 46.0 % 36.9 37.1 37.4  Platelets 150 - 400 K/uL 260 267.0 248      CMP     Component Value Date/Time   NA 141 04/13/2020 0802   NA 140 11/30/2013 1121   K 4.5 04/13/2020 0802   K 4.0 11/30/2013 1121   CL 103 04/13/2020 0802   CL 105 11/30/2013 1121   CO2 30 04/13/2020 0802   CO2 31 11/30/2013 1121   GLUCOSE 116 (H) 04/13/2020 0802   GLUCOSE 103 (H) 11/30/2013 1121   BUN 25 (H) 04/13/2020 0802   BUN 14 11/30/2013 1121   CREATININE 0.98 04/13/2020 0802   CREATININE 0.83 11/30/2013 1121   CALCIUM 9.4 04/13/2020 0802   CALCIUM 8.2 (L) 11/30/2013 1121   PROT 6.7 04/13/2020 0802   PROT 5.9 (L) 11/30/2013 1121   ALBUMIN 3.8 04/13/2020 0802   ALBUMIN 3.0 (L) 11/30/2013 1121   AST 32 04/13/2020 0802   AST 37 11/30/2013 1121   ALT 27 04/13/2020 0802   ALT 39 11/30/2013 1121   ALKPHOS 46 04/13/2020 0802   ALKPHOS 88 11/30/2013 1121   BILITOT 1.0 04/13/2020 0802   BILITOT 0.4 11/30/2013 1121   GFRNONAA 60 (L) 08/17/2019 1024   GFRNONAA >60 11/30/2013 1121   GFRNONAA >60 08/31/2013 0917   GFRAA >60 08/17/2019 1024   GFRAA >60 11/30/2013 1121   GFRAA >60 08/31/2013  0917     No results found.     Assessment & Plan:   1. Bilateral carotid artery stenosis Recommend:  Given the patient's asymptomatic subcritical  stenosis no further invasive testing or surgery at this time.  Duplex ultrasound shows 40-59% stenosis bilaterally.  Continue antiplatelet therapy as prescribed Continue management of CAD, HTN and Hyperlipidemia Healthy heart diet,  encouraged exercise at least 4 times per week Follow up in 12 months with duplex ultrasound and physical exam    2. Subclavian arterial stenosis ([facility]) Currently asymptomatic.  We will continue to monitor with annual carotid studies.  3. Hypercholesterolemia Continue statin as ordered and reviewed, no changes at this time   4. Lymphedema Currently patient is doing well with this.  No worsening swelling or ulcerations.  We will continue to monitor annually.   Current Outpatient Medications on File Prior to Visit  Medication Sig Dispense Refill   alendronate (FOSAMAX) 70 MG tablet TAKE 1 TABLET BY MOUTH 1 TIME A WEEK WITH A FULL GLASS OF WATER AND ON AN EMPTY STOMACH 4 tablet 11   aspirin EC 81 MG tablet Take 81 mg by mouth daily.     Biotin 1000 MCG tablet Take 1,000 mcg by mouth 2 (two) times daily.      calcium citrate-vitamin D (CITRACAL+D) 315-200 MG-UNIT per tablet Take 1 tablet by mouth 2 (two) times daily.     Cholecalciferol (VITAMIN D3) 5000 units CAPS Take 1 capsule by mouth daily.     glucosamine-chondroitin 500-400 MG tablet Take 1 tablet by mouth 2 (two) times daily.     loratadine (CLARITIN) 10 MG tablet Take 10 mg by mouth daily.     losartan-hydrochlorothiazide (HYZAAR) 50-12.5 MG tablet Take 1 tablet by mouth daily. 90 tablet 3   lovastatin (MEVACOR) 10 MG tablet TAKE 1 TABLET(10 MG) BY MOUTH AT BEDTIME 90 tablet 1   Multiple Vitamin (MULTI-VITAMINS) TABS Take 1 tablet by mouth daily.      Current Facility-Administered Medications on File Prior to Visit  Medication Dose Route Frequency  Provider Last Rate Last Admin   sodium chloride flush (NS) 0.9 % injection 10 mL  10 mL Intravenous PRN Choksi, Janak, MD   10 mL at 05/31/15 1040   sodium chloride flush (NS) 0.9 % injection 10 mL  10 mL Intravenous PRN Forest Gleason, MD   10 mL at 07/12/15 1018    There are no Patient Instructions on file for this visit. No follow-ups on file.   Kris Hartmann, NP

## 2020-08-16 ENCOUNTER — Inpatient Hospital Stay: Payer: Medicare Other | Attending: Obstetrics and Gynecology

## 2020-08-16 ENCOUNTER — Other Ambulatory Visit: Payer: Self-pay

## 2020-08-16 DIAGNOSIS — C20 Malignant neoplasm of rectum: Secondary | ICD-10-CM | POA: Insufficient documentation

## 2020-08-16 LAB — COMPREHENSIVE METABOLIC PANEL
ALT: 27 U/L (ref 0–44)
AST: 31 U/L (ref 15–41)
Albumin: 3.8 g/dL (ref 3.5–5.0)
Alkaline Phosphatase: 45 U/L (ref 38–126)
Anion gap: 9 (ref 5–15)
BUN: 27 mg/dL — ABNORMAL HIGH (ref 8–23)
CO2: 28 mmol/L (ref 22–32)
Calcium: 9.3 mg/dL (ref 8.9–10.3)
Chloride: 103 mmol/L (ref 98–111)
Creatinine, Ser: 0.93 mg/dL (ref 0.44–1.00)
GFR, Estimated: >60 mL/min (ref >60)
Glucose, Bld: 120 mg/dL — ABNORMAL HIGH (ref 70–99)
Potassium: 4.3 mmol/L (ref 3.5–5.1)
Sodium: 140 mmol/L (ref 135–145)
Total Bilirubin: 1.1 mg/dL (ref 0.3–1.2)
Total Protein: 6.8 g/dL (ref 6.5–8.1)

## 2020-08-16 LAB — CBC WITH DIFFERENTIAL/PLATELET
Abs Immature Granulocytes: 0.03 10*3/uL (ref 0.00–0.07)
Basophils Absolute: 0.1 10*3/uL (ref 0.0–0.1)
Basophils Relative: 1 %
Eosinophils Absolute: 0 10*3/uL (ref 0.0–0.5)
Eosinophils Relative: 1 %
HCT: 39 % (ref 36.0–46.0)
Hemoglobin: 13.1 g/dL (ref 12.0–15.0)
Immature Granulocytes: 0 %
Lymphocytes Relative: 16 %
Lymphs Abs: 1.1 10*3/uL (ref 0.7–4.0)
MCH: 34.8 pg — ABNORMAL HIGH (ref 26.0–34.0)
MCHC: 33.6 g/dL (ref 30.0–36.0)
MCV: 103.7 fL — ABNORMAL HIGH (ref 80.0–100.0)
Monocytes Absolute: 0.6 10*3/uL (ref 0.1–1.0)
Monocytes Relative: 9 %
Neutro Abs: 5.3 10*3/uL (ref 1.7–7.7)
Neutrophils Relative %: 73 %
Platelets: 270 10*3/uL (ref 150–400)
RBC: 3.76 MIL/uL — ABNORMAL LOW (ref 3.87–5.11)
RDW: 13.2 % (ref 11.5–15.5)
WBC: 7.2 10*3/uL (ref 4.0–10.5)
nRBC: 0 % (ref 0.0–0.2)

## 2020-08-16 NOTE — Progress Notes (Signed)
[place]  [phone]  [phone]     Christina Mathis DOB: October 24, 1935  MR#: [id number]  IWP#:[id number]  Patient Care Team: Einar Pheasant, MD as PCP - General (Internal Medicine) Paulla Dolly Tamala Fothergill, DPM as Consulting Physician (Podiatry) Grayland Ormond Kathlene November, MD as Consulting Physician (Oncology) Efrain Sella, MD as Consulting Physician (Gastroenterology)  CHIEF COMPLAINT: Adenocarcinoma of the rectum.  INTERVAL HISTORY: Patient returns to clinic today for repeat laboratory work and routine yearly evaluation.  She continues to feel well and remains asymptomatic.  She continues to remain active.  She continues to feel well and remains asymptomatic.  She reports no changes in bowel movements or problems with her colostomy.  She has no neurologic complaints.  She denies any recent fevers or illnesses.  She has no chest pain, shortness of breath, cough, or hemoptysis.  She has a good appetite and denies weight loss.  She has no nausea, vomiting, constipation, or diarrhea.  She reports no melena or hematochezia.  She has no urinary complaints.  Patient feels at her baseline offers no specific complaints today.  REVIEW OF SYSTEMS:   Review of Systems  Constitutional: Negative.  Negative for fever, malaise/fatigue and weight loss.  Respiratory: Negative.  Negative for cough and shortness of breath.   Cardiovascular: Negative.  Negative for chest pain and leg swelling.  Gastrointestinal: Negative.  Negative for abdominal pain, blood in stool and melena.  Genitourinary: Negative.  Negative for dysuria.  Musculoskeletal: Negative.  Negative for back pain.  Skin: Negative.  Negative for rash.  Neurological: Negative.  Negative for tingling, sensory change, focal weakness, weakness and headaches.  Psychiatric/Behavioral: Negative.  The patient is not nervous/anxious.    As per HPI. Otherwise, a complete review of systems is negative.  ONCOLOGY  HISTORY: Oncology History Overview Note  20. 85 year old female with stage III (T3, N1, M0) adenocarcinoma of the rectum for  preop chemoradiation 2. Starting 5-FU by continuous infusion and radiation therapy November 24, 2012 3. Patient is finishing up radiation and chemotherapy on December 15 of 2014 4. Status postsurgery and colostomyFebruary of 2015, pT0 pNO tumor stage 0 5. Post operative adjuvant with FOLFOX march 2015 6.patient finished total 6 cycles of chemotherapy with FOLFOX on August 31, 2013. 7.  January 2 016 [name] had hernia repair      PAST MEDICAL HISTORY: Past Medical History:  Diagnosis Date   Colon cancer [facility])    History of shingles    twice   Hypercholesteremia    Hypertension    Left carotid bruit    Nephrolithiasis    Neuropathy    Personal history of chemotherapy 2014   Colon   Personal history of radiation therapy 2014   Colon   Urine incontinence 03/06/2013   h/o    PAST SURGICAL HISTORY: Past Surgical History:  Procedure Laterality Date   APPENDECTOMY  1979   colorectal   CESAREAN SECTION     COLONOSCOPY WITH PROPOFOL N/A 03/21/2015   Procedure: COLONOSCOPY WITH PROPOFOL;  Surgeon: Hulen Luster, MD;  Location: [facility] ENDOSCOPY;  Service: Gastroenterology;  Laterality: N/A;   COLONOSCOPY WITH PROPOFOL N/A 03/21/2020   Procedure: COLONOSCOPY WITH PROPOFOL;  Surgeon: Lesly Rubenstein, MD;  Location: [facility] ENDOSCOPY;  Service: Endoscopy;  Laterality: N/A;   COLOSTOMY     surgery for colorectal cancer     VENTRAL HERNIA REPAIR Right [phone]    FAMILY HISTORY Family History  Problem Relation Age of Onset   Hypertension Mother  Hypertension Father    Breast cancer Paternal Aunt     GYNECOLOGIC HISTORY:  No LMP recorded. Patient is postmenopausal.     ADVANCED DIRECTIVES:    HEALTH MAINTENANCE: Social History   Tobacco Use   Smoking status: Never   Smokeless tobacco: Never  Vaping Use   Vaping Use: Never used  Substance Use Topics    Alcohol use: No    Alcohol/week: 0.0 standard drinks   Drug use: No     No Known Allergies  Current Outpatient Medications  Medication Sig Dispense Refill   alendronate (FOSAMAX) 70 MG tablet TAKE 1 TABLET BY MOUTH 1 TIME A WEEK WITH A FULL GLASS OF WATER AND ON AN EMPTY STOMACH 4 tablet 11   aspirin EC 81 MG tablet Take 81 mg by mouth daily.     Biotin 1000 MCG tablet Take 1,000 mcg by mouth 2 (two) times daily.      calcium citrate-vitamin D (CITRACAL+D) 315-200 MG-UNIT per tablet Take 1 tablet by mouth 2 (two) times daily.     Cholecalciferol (VITAMIN D3) 5000 units CAPS Take 1 capsule by mouth daily.     glucosamine-chondroitin 500-400 MG tablet Take 1 tablet by mouth 2 (two) times daily.     losartan-hydrochlorothiazide (HYZAAR) 50-12.5 MG tablet Take 1 tablet by mouth daily. 90 tablet 3   lovastatin (MEVACOR) 10 MG tablet TAKE 1 TABLET(10 MG) BY MOUTH AT BEDTIME 90 tablet 1   Multiple Vitamin (MULTI-VITAMINS) TABS Take 1 tablet by mouth daily.      loratadine (CLARITIN) 10 MG tablet Take 10 mg by mouth daily. (Patient not taking: Reported on 08/17/2020)     No current facility-administered medications for this visit.   Facility-Administered Medications Ordered in Other Visits  Medication Dose Route Frequency Provider Last Rate Last Admin   sodium chloride flush (NS) 0.9 % injection 10 mL  10 mL Intravenous PRN Choksi, Delorise Shiner, MD   10 mL at 05/31/15 1040   sodium chloride flush (NS) 0.9 % injection 10 mL  10 mL Intravenous PRN Choksi, Delorise Shiner, MD   10 mL at 07/12/15 1018    OBJECTIVE: BP (!) 143/60 (BP Location: Left Arm, Patient Position: Sitting)   Pulse 75   Temp (!) 96.9 F (36.1 C) (Tympanic)   Resp 18   Wt 135 lb (61.2 kg)   SpO2 100%   BMI 23.91 kg/m    Body mass index is 23.91 kg/m.    ECOG FS:0 - Asymptomatic  General: Well-developed, well-nourished, no acute distress. Eyes: Pink conjunctiva, anicteric sclera. HEENT: Normocephalic, moist mucous  membranes. Lungs: No audible wheezing or coughing. Heart: Regular rate and rhythm. Abdomen: Soft, nontender, no obvious distention.  Colostomy noted. Musculoskeletal: No edema, cyanosis, or clubbing. Neuro: Alert, answering all questions appropriately. Cranial nerves grossly intact. Skin: No rashes or petechiae noted. Psych: Normal affect.   LAB RESULTS:  Appointment on 08/16/2020  Component Date Value Ref Range Status   CEA 08/16/2020 2.7  0.0 - 4.7 ng/mL Final   Comment: (NOTE)                             Nonsmokers          <3.9                             Smokers             <5.6 Roche  [employer]ostics Electrochemiluminescence Immunoassay (ECLIA) Values obtained with different assay methods or kits cannot be used interchangeably.  Results cannot be interpreted as absolute evidence of the presence or absence of malignant disease. Performed At: [facility] [place], [place] [id number] Rush Farmer MD KK:[id number]    Sodium 08/16/2020 140  135 - 145 mmol/L Final   Potassium 08/16/2020 4.3  3.5 - 5.1 mmol/L Final   Chloride 08/16/2020 103  98 - 111 mmol/L Final   CO2 08/16/2020 28  22 - 32 mmol/L Final   Glucose, Bld 08/16/2020 120 (A) 70 - 99 mg/dL Final   Glucose reference range applies only to samples taken after fasting for at least 8 hours.   BUN 08/16/2020 27 (A) 8 - 23 mg/dL Final   Creatinine, Ser 08/16/2020 0.93  0.44 - 1.00 mg/dL Final   Calcium 08/16/2020 9.3  8.9 - 10.3 mg/dL Final   Total Protein 08/16/2020 6.8  6.5 - 8.1 g/dL Final   Albumin 08/16/2020 3.8  3.5 - 5.0 g/dL Final   AST 08/16/2020 31  15 - 41 U/L Final   ALT 08/16/2020 27  0 - 44 U/L Final   Alkaline Phosphatase 08/16/2020 45  38 - 126 U/L Final   Total Bilirubin 08/16/2020 1.1  0.3 - 1.2 mg/dL Final   GFR, Estimated 08/16/2020 >60  >60 mL/min Final   Comment: (NOTE) Calculated using the CKD-EPI Creatinine Equation (2021)    Anion gap 08/16/2020 9  5 - 15 Final    Performed at [facility], [address] [place], [place] [address]   WBC 08/16/2020 7.2  4.0 - 10.5 K/uL Final   RBC 08/16/2020 3.76 (A) 3.87 - 5.11 MIL/uL Final   Hemoglobin 08/16/2020 13.1  12.0 - 15.0 g/dL Final   HCT 08/16/2020 39.0  36.0 - 46.0 % Final   MCV 08/16/2020 103.7 (A) 80.0 - 100.0 fL Final   MCH 08/16/2020 34.8 (A) 26.0 - 34.0 pg Final   MCHC 08/16/2020 33.6  30.0 - 36.0 g/dL Final   RDW 08/16/2020 13.2  11.5 - 15.5 % Final   Platelets 08/16/2020 270  150 - 400 K/uL Final   nRBC 08/16/2020 0.0  0.0 - 0.2 % Final   Neutrophils Relative % 08/16/2020 73  % Final   Neutro Abs 08/16/2020 5.3  1.7 - 7.7 K/uL Final   Lymphocytes Relative 08/16/2020 16  % Final   Lymphs Abs 08/16/2020 1.1  0.7 - 4.0 K/uL Final   Monocytes Relative 08/16/2020 9  % Final   Monocytes Absolute 08/16/2020 0.6  0.1 - 1.0 K/uL Final   Eosinophils Relative 08/16/2020 1  % Final   Eosinophils Absolute 08/16/2020 0.0  0.0 - 0.5 K/uL Final   Basophils Relative 08/16/2020 1  % Final   Basophils Absolute 08/16/2020 0.1  0.0 - 0.1 K/uL Final   Immature Granulocytes 08/16/2020 0  % Final   Abs Immature Granulocytes 08/16/2020 0.03  0.00 - 0.07 K/uL Final   Performed at [facility], [address], [place], [place] [address]   Lab Results  Component Value Date   CEA 2.3 01/11/2016    STUDIES: No results found.  ASSESSMENT:  Adenocarcinoma of the rectum, stage III, T3 N1 M0.  PLAN:    1. Rectal cancer: No evidence of disease.  Patient completed 6 cycles of adjuvant FOLFOX in August 2015.  Patient's most recent colonoscopy in February 2022 was reported as normal with no evidence of progression or recurrent disease.  Recommendation was repeat in 5 years.  Her most recent CEA continues to be within normal limits at 2.7.  Although patient is greater than 5 years removed from completing her treatment, she has asked for continued yearly follow-up.  Return to clinic in 1 year for repeat  laboratory work and further evaluation.   2. Lymphedema in the left lower extremity: Patient does not complain of this today.  Continue compression stockings as instructed by lymphedema clinic.  3. Peripheral neuropathy: Patient does not complain of this today.  Patient has discontinued Lyrica and gabapentin. 4.  Elevated MCV: Chronic and unchanged.  No intervention is needed.  Patient expressed understanding and was in agreement with this plan. She also understands that She can call clinic at any time with any questions, concerns, or complaints.    Lloyd Huger, MD 08/22/20 6:10 AM

## 2020-08-17 ENCOUNTER — Encounter: Payer: Self-pay | Admitting: Oncology

## 2020-08-17 ENCOUNTER — Inpatient Hospital Stay: Payer: Medicare Other | Attending: Oncology | Admitting: Oncology

## 2020-08-17 VITALS — BP 143/60 | HR 75 | Temp 96.9°F | Resp 18 | Wt 135.0 lb

## 2020-08-17 DIAGNOSIS — Z85048 Personal history of other malignant neoplasm of rectum, rectosigmoid junction, and anus: Secondary | ICD-10-CM | POA: Insufficient documentation

## 2020-08-17 DIAGNOSIS — Z803 Family history of malignant neoplasm of breast: Secondary | ICD-10-CM | POA: Diagnosis not present

## 2020-08-17 DIAGNOSIS — G629 Polyneuropathy, unspecified: Secondary | ICD-10-CM | POA: Insufficient documentation

## 2020-08-17 DIAGNOSIS — C20 Malignant neoplasm of rectum: Secondary | ICD-10-CM | POA: Diagnosis not present

## 2020-08-17 DIAGNOSIS — I10 Essential (primary) hypertension: Secondary | ICD-10-CM | POA: Insufficient documentation

## 2020-08-17 DIAGNOSIS — E7800 Pure hypercholesterolemia, unspecified: Secondary | ICD-10-CM | POA: Insufficient documentation

## 2020-08-17 DIAGNOSIS — Z79899 Other long term (current) drug therapy: Secondary | ICD-10-CM | POA: Insufficient documentation

## 2020-08-17 DIAGNOSIS — Z923 Personal history of irradiation: Secondary | ICD-10-CM | POA: Diagnosis not present

## 2020-08-17 DIAGNOSIS — Z7982 Long term (current) use of aspirin: Secondary | ICD-10-CM | POA: Diagnosis not present

## 2020-08-17 DIAGNOSIS — R718 Other abnormality of red blood cells: Secondary | ICD-10-CM | POA: Insufficient documentation

## 2020-08-17 DIAGNOSIS — Z9221 Personal history of antineoplastic chemotherapy: Secondary | ICD-10-CM | POA: Insufficient documentation

## 2020-08-17 DIAGNOSIS — I890 Lymphedema, not elsewhere classified: Secondary | ICD-10-CM | POA: Diagnosis not present

## 2020-08-17 LAB — CEA: CEA: 2.7 ng/mL (ref 0.0–4.7)

## 2020-08-17 NOTE — Progress Notes (Signed)
Patient here for oncology follow-up appointment, concerns of urinary urgency

## 2020-08-29 ENCOUNTER — Other Ambulatory Visit: Payer: Self-pay

## 2020-08-29 ENCOUNTER — Other Ambulatory Visit (INDEPENDENT_AMBULATORY_CARE_PROVIDER_SITE_OTHER): Payer: Medicare Other

## 2020-08-29 DIAGNOSIS — R739 Hyperglycemia, unspecified: Secondary | ICD-10-CM | POA: Diagnosis not present

## 2020-08-29 DIAGNOSIS — E7800 Pure hypercholesterolemia, unspecified: Secondary | ICD-10-CM | POA: Diagnosis not present

## 2020-08-29 LAB — LIPID PANEL
Cholesterol: 106 mg/dL (ref 0–200)
HDL: 35.4 mg/dL — ABNORMAL LOW (ref >39.00)
LDL Cholesterol: 52 mg/dL (ref 0–99)
NonHDL: 70.25
Total CHOL/HDL Ratio: 3
Triglycerides: 89 mg/dL (ref 0.0–149.0)
VLDL: 17.8 mg/dL (ref 0.0–40.0)

## 2020-08-29 LAB — HEPATIC FUNCTION PANEL
ALT: 26 U/L (ref 0–35)
AST: 26 U/L (ref 0–37)
Albumin: 3.8 g/dL (ref 3.5–5.2)
Alkaline Phosphatase: 45 U/L (ref 39–117)
Bilirubin, Direct: 0.2 mg/dL (ref 0.0–0.3)
Total Bilirubin: 1 mg/dL (ref 0.2–1.2)
Total Protein: 6.3 g/dL (ref 6.0–8.3)

## 2020-08-29 LAB — TSH: TSH: 2.92 u[IU]/mL (ref 0.35–5.50)

## 2020-08-29 LAB — BASIC METABOLIC PANEL
BUN: 27 mg/dL — ABNORMAL HIGH (ref 6–23)
CO2: 33 mEq/L — ABNORMAL HIGH (ref 19–32)
Calcium: 9.4 mg/dL (ref 8.4–10.5)
Chloride: 104 mEq/L (ref 96–112)
Creatinine, Ser: 0.91 mg/dL (ref 0.40–1.20)
GFR: 58.57 mL/min — ABNORMAL LOW (ref >60.00)
Glucose, Bld: 112 mg/dL — ABNORMAL HIGH (ref 70–99)
Potassium: 4.8 mEq/L (ref 3.5–5.1)
Sodium: 142 mEq/L (ref 135–145)

## 2020-08-29 LAB — HEMOGLOBIN A1C: Hgb A1c MFr Bld: 5.8 % (ref 4.6–6.5)

## 2020-08-30 LAB — HM DIABETES FOOT EXAM

## 2020-08-31 ENCOUNTER — Other Ambulatory Visit: Payer: Self-pay

## 2020-08-31 ENCOUNTER — Other Ambulatory Visit: Payer: Self-pay | Admitting: Internal Medicine

## 2020-08-31 ENCOUNTER — Encounter: Payer: Self-pay | Admitting: Internal Medicine

## 2020-08-31 ENCOUNTER — Ambulatory Visit (INDEPENDENT_AMBULATORY_CARE_PROVIDER_SITE_OTHER): Payer: Medicare Other | Admitting: Internal Medicine

## 2020-08-31 VITALS — BP 132/74 | HR 77 | Temp 96.5°F | Ht 62.99 in | Wt 134.4 lb

## 2020-08-31 DIAGNOSIS — D7589 Other specified diseases of blood and blood-forming organs: Secondary | ICD-10-CM | POA: Diagnosis not present

## 2020-08-31 DIAGNOSIS — S98132A Complete traumatic amputation of one left lesser toe, initial encounter: Secondary | ICD-10-CM

## 2020-08-31 DIAGNOSIS — Z85048 Personal history of other malignant neoplasm of rectum, rectosigmoid junction, and anus: Secondary | ICD-10-CM

## 2020-08-31 DIAGNOSIS — I6523 Occlusion and stenosis of bilateral carotid arteries: Secondary | ICD-10-CM | POA: Diagnosis not present

## 2020-08-31 DIAGNOSIS — E7800 Pure hypercholesterolemia, unspecified: Secondary | ICD-10-CM

## 2020-08-31 DIAGNOSIS — I771 Stricture of artery: Secondary | ICD-10-CM | POA: Diagnosis not present

## 2020-08-31 DIAGNOSIS — Z933 Colostomy status: Secondary | ICD-10-CM

## 2020-08-31 DIAGNOSIS — R739 Hyperglycemia, unspecified: Secondary | ICD-10-CM

## 2020-08-31 DIAGNOSIS — E2839 Other primary ovarian failure: Secondary | ICD-10-CM

## 2020-08-31 NOTE — Progress Notes (Signed)
[name], female   DOB: 08/24/1935, 85 y.o.   MRN: [id number]   Subjective:    [name], female    DOB: Jan 08, 1936, 85 y.o.   MRN: [id number]  HPI This visit occurred during the SARS-CoV-2 public health emergency.  Safety protocols were in place, including screening questions prior to the visit, additional usage of staff PPE, and extensive cleaning of exam room while observing appropriate contact time as indicated for disinfecting solutions.   Patient here for a scheduled follow up.  Here to follow-up regarding her blood pressure and cholesterol.  She recently saw oncology for follow-up of her history of rectal cancer.  They felt things were stable.  Recommended follow-up in 1 year.  She saw [place] [facility] as well (08/05/2020)-felt stable recommended follow-up in 12 months.  She tries to stay active.  No chest pain or shortness of breath reported.  No abdominal pain or bowel change reported.  No increased cough or congestion.  Discussed need for bone density.  Blood pressure doing well.  Her main concern is her toe.  Since her surgery her toe has pushed in on the adjacent toe.   She is seeing podiatry.  Nothing has really made a difference.  She desires no further intervention at this time.   Past Medical History:  Diagnosis Date   Colon cancer [facility])    History of shingles    twice   Hypercholesteremia    Hypertension    Left carotid bruit    Nephrolithiasis    Neuropathy    Personal history of chemotherapy 2014   Colon   Personal history of radiation therapy 2014   Colon   Urine incontinence 03/06/2013   h/o   Past Surgical History:  Procedure Laterality Date   APPENDECTOMY  1979   colorectal   CESAREAN SECTION     COLONOSCOPY WITH PROPOFOL N/A 03/21/2015   Procedure: COLONOSCOPY WITH PROPOFOL;  Surgeon: Hulen Luster, MD;  Location: [facility] ENDOSCOPY;  Service: Gastroenterology;  Laterality: N/A;   COLONOSCOPY WITH PROPOFOL N/A 03/21/2020    Procedure: COLONOSCOPY WITH PROPOFOL;  Surgeon: Lesly Rubenstein, MD;  Location: [facility] ENDOSCOPY;  Service: Endoscopy;  Laterality: N/A;   COLOSTOMY     surgery for colorectal cancer     VENTRAL HERNIA REPAIR Right [phone]   Family History  Problem Relation Age of Onset   Hypertension Mother    Hypertension Father    Breast cancer Paternal Aunt    Social History   Socioeconomic History   Marital status: Widowed    Spouse name: Not on file   Number of children: Not on file   Years of education: Not on file   Highest education level: Not on file  Occupational History   Not on file  Tobacco Use   Smoking status: Never   Smokeless tobacco: Never  Vaping Use   Vaping Use: Never used  Substance and Sexual Activity   Alcohol use: No    Alcohol/week: 0.0 standard drinks   Drug use: No   Sexual activity: Not Currently  Other Topics Concern   Not on file  Social History Narrative   Not on file   Social Determinants of Health   Financial Resource Strain: Low Risk    Difficulty of Paying Living Expenses: Not hard at all  Food Insecurity: No Food Insecurity   Worried About [place] in the Last Year: Never true   Arboriculturist in  the Last Year: Never true  Transportation Needs: No Transportation Needs   Lack of Transportation (Medical): No   Lack of Transportation (Non-Medical): No  Physical Activity: Insufficiently Active   Days of Exercise per Week: 4 days   Minutes of Exercise per Session: 30 min  Stress: No Stress Concern Present   Feeling of Stress : Not at all  Social Connections: Unknown   Frequency of Communication with Friends and Family: More than three times a week   Frequency of Social Gatherings with Friends and Family: Not on file   Attends Religious Services: Not on file   Active Member of Clubs or Organizations: Yes   Attends Archivist Meetings: Not on file   Marital Status: Not on file    Review of Systems  Constitutional:   Negative for appetite change, fatigue and unexpected weight change.  HENT:  Negative for congestion and sinus pressure.   Respiratory:  Negative for cough, chest tightness and shortness of breath.   Cardiovascular:  Negative for chest pain, palpitations and leg swelling.  Gastrointestinal:  Negative for abdominal pain, diarrhea, nausea and vomiting.  Genitourinary:  Negative for difficulty urinating and dysuria.  Musculoskeletal:  Negative for joint swelling and myalgias.  Skin:  Negative for color change and rash.  Neurological:  Negative for dizziness, light-headedness and headaches.  Psychiatric/Behavioral:  Negative for agitation and dysphoric mood.       Objective:    Physical Exam Vitals reviewed.  Constitutional:      General: She is not in acute distress.    Appearance: Normal appearance.  HENT:     Head: Normocephalic and atraumatic.     Right Ear: External ear normal.     Left Ear: External ear normal.  Eyes:     General: No scleral icterus.       Right eye: No discharge.        Left eye: No discharge.     Conjunctiva/sclera: Conjunctivae normal.  Neck:     Thyroid: No thyromegaly.  Cardiovascular:     Rate and Rhythm: Normal rate and regular rhythm.  Pulmonary:     Effort: No respiratory distress.     Breath sounds: Normal breath sounds. No wheezing.  Abdominal:     General: Bowel sounds are normal.     Palpations: Abdomen is soft.     Tenderness: There is no abdominal tenderness.  Musculoskeletal:        General: No swelling or tenderness.     Cervical back: Neck supple. No tenderness.  Lymphadenopathy:     Cervical: No cervical adenopathy.  Skin:    Findings: No erythema or rash.  Neurological:     Mental Status: She is alert.  Psychiatric:        Mood and Affect: Mood normal.        Behavior: Behavior normal.    BP 132/74   Pulse 77   Temp (!) 96.5 F (35.8 C)   Ht 5' 2.99" (1.6 m)   Wt 134 lb 6.4 oz (61 kg)   SpO2 98%   BMI 23.81 kg/m  Wt  Readings from Last 3 Encounters:  08/31/20 134 lb 6.4 oz (61 kg)  08/17/20 135 lb (61.2 kg)  08/05/20 137 lb (62.1 kg)    Outpatient Encounter Medications as of 08/31/2020  Medication Sig   aspirin EC 81 MG tablet Take 81 mg by mouth daily.   Biotin 1000 MCG tablet Take 1,000 mcg by mouth 2 (two) times daily.  calcium citrate-vitamin D (CITRACAL+D) 315-200 MG-UNIT per tablet Take 1 tablet by mouth 2 (two) times daily.   Cholecalciferol (VITAMIN D3) 5000 units CAPS Take 1 capsule by mouth daily.   glucosamine-chondroitin 500-400 MG tablet Take 1 tablet by mouth 2 (two) times daily.   loratadine (CLARITIN) 10 MG tablet Take 10 mg by mouth daily as needed.   losartan-hydrochlorothiazide (HYZAAR) 50-12.5 MG tablet Take 1 tablet by mouth daily.   lovastatin (MEVACOR) 10 MG tablet TAKE 1 TABLET(10 MG) BY MOUTH AT BEDTIME   Multiple Vitamin (MULTI-VITAMINS) TABS Take 1 tablet by mouth daily.    [DISCONTINUED] alendronate (FOSAMAX) 70 MG tablet TAKE 1 TABLET BY MOUTH 1 TIME A WEEK WITH A FULL GLASS OF WATER AND ON AN EMPTY STOMACH   Facility-Administered Encounter Medications as of 08/31/2020  Medication   sodium chloride flush (NS) 0.9 % injection 10 mL   sodium chloride flush (NS) 0.9 % injection 10 mL     Lab Results  Component Value Date   WBC 7.2 08/16/2020   HGB 13.1 08/16/2020   HCT 39.0 08/16/2020   PLT 270 08/16/2020   GLUCOSE 112 (H) 08/29/2020   CHOL 106 08/29/2020   TRIG 89.0 08/29/2020   HDL 35.40 (L) 08/29/2020   LDLCALC 52 08/29/2020   ALT 26 08/29/2020   AST 26 08/29/2020   NA 142 08/29/2020   K 4.8 08/29/2020   CL 104 08/29/2020   CREATININE 0.91 08/29/2020   BUN 27 (H) 08/29/2020   CO2 33 (H) 08/29/2020   TSH 2.92 08/29/2020   INR 1.1 10/21/2012   HGBA1C 5.8 08/29/2020   MICROALBUR 1.1 10/19/2016       Assessment & Plan:   Problem List Items Addressed This Visit     Amputated toe of left foot ([place])    S/p amputation - toe.  Her toe is pushing on her  adjacent toe.  Has seen podiatry.  Discussed f/u. She will let me know if desires f/u.        Carotid artery disease ([place])    Evaluated 08/05/20 - stable.  Recommended f/u in 12 months.        Colostomy status ([place])    Ostomy working well.  Follow.        History of rectal cancer    S/p chemo, XRT and colectomy.  Just had colonoscopy - ok.  Followed by Dr Grayland Ormond.  Ostomy working well.  Just evaluated 07/2020.  Stable.  Recommended f/u in one year.        Hypercholesterolemia    On lovastatin.  Low cholesterol diet and exercise.  Follow lipid panel and liver function tests.   Lab Results  Component Value Date   CHOL 106 08/29/2020   HDL 35.40 (L) 08/29/2020   LDLCALC 52 08/29/2020   TRIG 89.0 08/29/2020   CHOLHDL 3 08/29/2020       Relevant Orders   Hepatic function panel   Lipid panel   Basic metabolic panel   Hyperglycemia    Low carb diet and exercise.  Follow met b and a1c.   Lab Results  Component Value Date   HGBA1C 5.8 08/29/2020       Relevant Orders   Hemoglobin A1c   Macrocytosis    Follow cbc.        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  Stable.  Evaluated 08/05/20 - stable.  Recommended f/u in one year.        Other Visit  Diagnoses     Estrogen deficiency    -  Primary   Relevant Orders   DG Bone Density        Einar Pheasant, MD

## 2020-09-04 ENCOUNTER — Encounter: Payer: Self-pay | Admitting: Internal Medicine

## 2020-09-04 NOTE — Assessment & Plan Note (Addendum)
S/p chemo, XRT and colectomy.  Just had colonoscopy - ok.  Followed by Dr Grayland Ormond.  Ostomy working well.  Just evaluated 07/2020.  Stable.  Recommended f/u in one year.

## 2020-09-04 NOTE — Assessment & Plan Note (Signed)
S/p amputation - toe.  Her toe is pushing on her adjacent toe.  Has seen podiatry.  Discussed f/u. She will let me know if desires f/u.

## 2020-09-04 NOTE — Assessment & Plan Note (Signed)
On lovastatin.  Low cholesterol diet and exercise.  Follow lipid panel and liver function tests.   Lab Results  Component Value Date   CHOL 106 08/29/2020   HDL 35.40 (L) 08/29/2020   LDLCALC 52 08/29/2020   TRIG 89.0 08/29/2020   CHOLHDL 3 08/29/2020

## 2020-09-04 NOTE — Assessment & Plan Note (Signed)
Evaluated 08/05/20 - stable.  Recommended f/u in 12 months.

## 2020-09-04 NOTE — Assessment & Plan Note (Signed)
Followed by [facility].  Stable.  Evaluated 08/05/20 - stable.  Recommended f/u in one year.

## 2020-09-04 NOTE — Assessment & Plan Note (Signed)
Low carb diet and exercise.  Follow met b and a1c.   Lab Results  Component Value Date   HGBA1C 5.8 08/29/2020

## 2020-09-04 NOTE — Assessment & Plan Note (Signed)
Follow cbc.  

## 2020-09-04 NOTE — Assessment & Plan Note (Signed)
Ostomy working well.  Follow.  

## 2020-11-08 ENCOUNTER — Telehealth: Payer: Self-pay | Admitting: Internal Medicine

## 2020-11-08 NOTE — Telephone Encounter (Signed)
Patient calling back in and informed.

## 2020-11-08 NOTE — Telephone Encounter (Signed)
She would need the new covid vaccine - it is directed to the variant we are seeing now.

## 2020-11-08 NOTE — Telephone Encounter (Signed)
Ok to do so due to her HX?

## 2020-11-08 NOTE — Telephone Encounter (Signed)
Detailed message has been left to inform patient per patient request.

## 2020-11-08 NOTE — Telephone Encounter (Signed)
Patient came in stating she is unsure if she should get her fourth booster shot or the new COVID shoot due to going to [facility] and being told to check with her doctor to see which shot they recommend.Please call patient,she stated it is ok to leave her a voicemail.

## 2020-11-10 ENCOUNTER — Other Ambulatory Visit: Payer: Self-pay | Admitting: Internal Medicine

## 2020-11-10 DIAGNOSIS — Z1231 Encounter for screening mammogram for malignant neoplasm of breast: Secondary | ICD-10-CM

## 2020-12-14 DIAGNOSIS — M3501 Sicca syndrome with keratoconjunctivitis: Secondary | ICD-10-CM | POA: Diagnosis not present

## 2020-12-14 DIAGNOSIS — H40003 Preglaucoma, unspecified, bilateral: Secondary | ICD-10-CM | POA: Diagnosis not present

## 2020-12-19 ENCOUNTER — Other Ambulatory Visit: Payer: Self-pay

## 2020-12-19 ENCOUNTER — Ambulatory Visit
Admission: RE | Admit: 2020-12-19 | Discharge: 2020-12-19 | Disposition: A | Discharge status: Home / Self Care | Payer: Medicare Other | Source: Ambulatory Visit | Attending: Internal Medicine | Admitting: Internal Medicine

## 2020-12-19 DIAGNOSIS — Z1231 Encounter for screening mammogram for malignant neoplasm of breast: Secondary | ICD-10-CM | POA: Diagnosis not present

## 2020-12-23 ENCOUNTER — Other Ambulatory Visit: Payer: Self-pay | Admitting: Internal Medicine

## 2020-12-29 ENCOUNTER — Other Ambulatory Visit: Payer: Self-pay

## 2020-12-29 ENCOUNTER — Other Ambulatory Visit (INDEPENDENT_AMBULATORY_CARE_PROVIDER_SITE_OTHER): Payer: Medicare Other

## 2020-12-29 DIAGNOSIS — E7800 Pure hypercholesterolemia, unspecified: Secondary | ICD-10-CM

## 2020-12-29 DIAGNOSIS — R739 Hyperglycemia, unspecified: Secondary | ICD-10-CM | POA: Diagnosis not present

## 2020-12-29 LAB — LIPID PANEL
Cholesterol: 104 mg/dL (ref 0–200)
HDL: 35.7 mg/dL — ABNORMAL LOW (ref >39.00)
LDL Cholesterol: 54 mg/dL (ref 0–99)
NonHDL: 68.24
Total CHOL/HDL Ratio: 3
Triglycerides: 69 mg/dL (ref 0.0–149.0)
VLDL: 13.8 mg/dL (ref 0.0–40.0)

## 2020-12-29 LAB — BASIC METABOLIC PANEL
BUN: 27 mg/dL — ABNORMAL HIGH (ref 6–23)
CO2: 34 mEq/L — ABNORMAL HIGH (ref 19–32)
Calcium: 9.4 mg/dL (ref 8.4–10.5)
Chloride: 103 mEq/L (ref 96–112)
Creatinine, Ser: 0.95 mg/dL (ref 0.40–1.20)
GFR: 55.5 mL/min — ABNORMAL LOW (ref >60.00)
Glucose, Bld: 121 mg/dL — ABNORMAL HIGH (ref 70–99)
Potassium: 3.9 mEq/L (ref 3.5–5.1)
Sodium: 140 mEq/L (ref 135–145)

## 2020-12-29 LAB — HEPATIC FUNCTION PANEL
ALT: 23 U/L (ref 0–35)
AST: 24 U/L (ref 0–37)
Albumin: 3.9 g/dL (ref 3.5–5.2)
Alkaline Phosphatase: 44 U/L (ref 39–117)
Bilirubin, Direct: 0.1 mg/dL (ref 0.0–0.3)
Total Bilirubin: 0.7 mg/dL (ref 0.2–1.2)
Total Protein: 6.5 g/dL (ref 6.0–8.3)

## 2020-12-29 LAB — HEMOGLOBIN A1C: Hgb A1c MFr Bld: 5.8 % (ref 4.6–6.5)

## 2021-01-02 ENCOUNTER — Other Ambulatory Visit: Payer: Self-pay

## 2021-01-02 ENCOUNTER — Encounter: Payer: Self-pay | Admitting: Internal Medicine

## 2021-01-02 ENCOUNTER — Ambulatory Visit (INDEPENDENT_AMBULATORY_CARE_PROVIDER_SITE_OTHER): Payer: Medicare Other | Admitting: Internal Medicine

## 2021-01-02 VITALS — BP 136/68 | HR 68 | Temp 98.6°F | Ht 63.0 in | Wt 129.0 lb

## 2021-01-02 DIAGNOSIS — R739 Hyperglycemia, unspecified: Secondary | ICD-10-CM

## 2021-01-02 DIAGNOSIS — I771 Stricture of artery: Secondary | ICD-10-CM | POA: Diagnosis not present

## 2021-01-02 DIAGNOSIS — Z0000 Encounter for general adult medical examination without abnormal findings: Secondary | ICD-10-CM

## 2021-01-02 DIAGNOSIS — Z23 Encounter for immunization: Secondary | ICD-10-CM

## 2021-01-02 DIAGNOSIS — D7589 Other specified diseases of blood and blood-forming organs: Secondary | ICD-10-CM | POA: Diagnosis not present

## 2021-01-02 DIAGNOSIS — I6523 Occlusion and stenosis of bilateral carotid arteries: Secondary | ICD-10-CM | POA: Diagnosis not present

## 2021-01-02 DIAGNOSIS — Z933 Colostomy status: Secondary | ICD-10-CM

## 2021-01-02 DIAGNOSIS — Z85048 Personal history of other malignant neoplasm of rectum, rectosigmoid junction, and anus: Secondary | ICD-10-CM

## 2021-01-02 DIAGNOSIS — S98132A Complete traumatic amputation of one left lesser toe, initial encounter: Secondary | ICD-10-CM

## 2021-01-02 DIAGNOSIS — E7800 Pure hypercholesterolemia, unspecified: Secondary | ICD-10-CM

## 2021-01-02 NOTE — Progress Notes (Signed)
[name], female   DOB: 03/08/1935, 85 y.o.   MRN: [id number]   Subjective:    [name], female    DOB: 05/17/1935, 85 y.o.   MRN: [id number]  This visit occurred during the SARS-CoV-2 public health emergency.  Safety protocols were in place, including screening questions prior to the visit, additional usage of staff PPE, and extensive cleaning of exam room while observing appropriate contact time as indicated for disinfecting solutions.   Patient here for her physical exam.   Chief Complaint  Patient presents with   Annual Exam    Fasting    Osteoporosis    Thought that you were going to take off fosamax   .   HPI She is doing well.  Tries to stay active.  No chest pain or sob with increased activity or exertion.  No cough or congestion.  Ostomy working well.  Up to date with eye exams, colonoscopy and mammogram.  Weight 129 pounds - down 6 pounds from last check.  States she has been watching what she eats.  No nausea or vomiting.     Past Medical History:  Diagnosis Date   Colon cancer (HCC)    History of shingles    twice   Hypercholesteremia    Hypertension    Left carotid bruit    Nephrolithiasis    Neuropathy    Personal history of chemotherapy 2014   Colon   Personal history of radiation therapy 2014   Colon   Urine incontinence 03/06/2013   h/o   Past Surgical History:  Procedure Laterality Date   APPENDECTOMY  1979   colorectal   CESAREAN SECTION     COLONOSCOPY WITH PROPOFOL N/A 03/21/2015   Procedure: COLONOSCOPY WITH PROPOFOL;  Surgeon: Paul Y Oh, MD;  Location: [facility];  Service: Gastroenterology;  Laterality: N/A;   COLONOSCOPY WITH PROPOFOL N/A 03/21/2020   Procedure: COLONOSCOPY WITH PROPOFOL;  Surgeon: Locklear, Cameron T, MD;  Location: [facility] ENDOSCOPY;  Service: Endoscopy;  Laterality: N/A;   COLOSTOMY     surgery for colorectal cancer     VENTRAL HERNIA REPAIR Right [phone]   Family History  Problem  Relation Age of Onset   Hypertension Mother    Hypertension Father    Breast cancer Paternal Aunt    Social History   Socioeconomic History   Marital status: Widowed    Spouse name: Not on file   Number of children: Not on file   Years of education: Not on file   Highest education level: Not on file  Occupational History   Not on file  Tobacco Use   Smoking status: Never   Smokeless tobacco: Never  Vaping Use   Vaping Use: Never used  Substance and Sexual Activity   Alcohol use: No    Alcohol/week: 0.0 standard drinks   Drug use: No   Sexual activity: Not Currently  Other Topics Concern   Not on file  Social History Narrative   Not on file   Social Determinants of Health   Financial Resource Strain: Low Risk    Difficulty of Paying Living Expenses: Not hard at all  Food Insecurity: No Food Insecurity   Worried About Running Out of Food in the Last Year: Never true   Ran Out of Food in the Last Year: Never true  Transportation Needs: No Transportation Needs   Lack of Transportation (Medical): No   Lack of Transportation (Non-Medical): No  Physical Activity: Insufficiently Active     Days of Exercise per Week: 4 days   Minutes of Exercise per Session: 30 min  Stress: No Stress Concern Present   Feeling of Stress : Not at all  Social Connections: Unknown   Frequency of Communication with Friends and Family: More than three times a week   Frequency of Social Gatherings with Friends and Family: Not on file   Attends Religious Services: Not on file   Active Member of Clubs or Organizations: Yes   Attends Archivist Meetings: Not on file   Marital Status: Not on file     Review of Systems  Constitutional:  Negative for appetite change and unexpected weight change.  HENT:  Negative for congestion and sinus pressure.   Respiratory:  Negative for cough, chest tightness and shortness of breath.   Cardiovascular:  Negative for chest pain, palpitations and leg  swelling.  Gastrointestinal:  Negative for abdominal pain, diarrhea, nausea and vomiting.  Genitourinary:  Negative for difficulty urinating and dysuria.  Musculoskeletal:  Negative for joint swelling and myalgias.  Skin:  Negative for color change and rash.  Neurological:  Negative for dizziness, light-headedness and headaches.  Psychiatric/Behavioral:  Negative for agitation and dysphoric mood.       Objective:     BP 136/68   Pulse 68   Temp 98.6 F (37 C) (Temporal)   Ht 5' 3" (1.6 m)   Wt 129 lb (58.5 kg)   SpO2 99%   BMI 22.85 kg/m  Wt Readings from Last 3 Encounters:  01/02/21 129 lb (58.5 kg)  08/31/20 134 lb 6.4 oz (61 kg)  08/17/20 135 lb (61.2 kg)    Physical Exam Vitals reviewed.  Constitutional:      General: She is not in acute distress.    Appearance: Normal appearance. She is well-developed.  HENT:     Head: Normocephalic and atraumatic.     Right Ear: External ear normal.     Left Ear: External ear normal.  Eyes:     General: No scleral icterus.       Right eye: No discharge.        Left eye: No discharge.     Conjunctiva/sclera: Conjunctivae normal.  Neck:     Thyroid: No thyromegaly.  Cardiovascular:     Rate and Rhythm: Normal rate and regular rhythm.  Pulmonary:     Effort: No tachypnea, accessory muscle usage or respiratory distress.     Breath sounds: Normal breath sounds. No decreased breath sounds or wheezing.  Chest:  Breasts:    Right: No inverted nipple, mass, nipple discharge or tenderness (no axillary adenopathy).     Left: No inverted nipple, mass, nipple discharge or tenderness (no axilarry adenopathy).  Abdominal:     General: Bowel sounds are normal.     Palpations: Abdomen is soft.     Tenderness: There is no abdominal tenderness.     Comments: Ostomy in place - no pain.    Musculoskeletal:        General: No swelling or tenderness.     Cervical back: Neck supple.  Lymphadenopathy:     Cervical: No cervical adenopathy.   Skin:    Findings: No erythema or rash.  Neurological:     Mental Status: She is alert and oriented to person, place, and time.  Psychiatric:        Mood and Affect: Mood normal.        Behavior: Behavior normal.     Outpatient Encounter Medications as  of 01/02/2021  Medication Sig   alendronate (FOSAMAX) 70 MG tablet TAKE 1 TABLET BY MOUTH 1 TIME A WEEK WITH A FULL GLASS OF WATER AND ON AN EMPTY STOMACH   aspirin EC 81 MG tablet Take 81 mg by mouth daily.   Biotin 1000 MCG tablet Take 1,000 mcg by mouth 2 (two) times daily.    calcium citrate-vitamin D (CITRACAL+D) 315-200 MG-UNIT per tablet Take 1 tablet by mouth 2 (two) times daily.   Cholecalciferol (VITAMIN D3) 5000 units CAPS Take 1 capsule by mouth daily.   glucosamine-chondroitin 500-400 MG tablet Take 1 tablet by mouth 2 (two) times daily.   loratadine (CLARITIN) 10 MG tablet Take 10 mg by mouth daily as needed.   losartan-hydrochlorothiazide (HYZAAR) 50-12.5 MG tablet TAKE 1 TABLET BY MOUTH DAILY   lovastatin (MEVACOR) 10 MG tablet TAKE 1 TABLET(10 MG) BY MOUTH AT BEDTIME   Multiple Vitamin (MULTI-VITAMINS) TABS Take 1 tablet by mouth daily.    Facility-Administered Encounter Medications as of 01/02/2021  Medication   sodium chloride flush (NS) 0.9 % injection 10 mL   sodium chloride flush (NS) 0.9 % injection 10 mL     Lab Results  Component Value Date   WBC 7.2 08/16/2020   HGB 13.1 08/16/2020   HCT 39.0 08/16/2020   PLT 270 08/16/2020   GLUCOSE 121 (H) 12/29/2020   CHOL 104 12/29/2020   TRIG 69.0 12/29/2020   HDL 35.70 (L) 12/29/2020   LDLCALC 54 12/29/2020   ALT 23 12/29/2020   AST 24 12/29/2020   NA 140 12/29/2020   K 3.9 12/29/2020   CL 103 12/29/2020   CREATININE 0.95 12/29/2020   BUN 27 (H) 12/29/2020   CO2 34 (H) 12/29/2020   TSH 2.92 08/29/2020   INR 1.1 10/21/2012   HGBA1C 5.8 12/29/2020   MICROALBUR 1.1 10/19/2016    MM 3D SCREEN BREAST BILATERAL  Result Date: 12/19/2020 CLINICAL DATA:   Screening. EXAM: DIGITAL SCREENING BILATERAL MAMMOGRAM WITH TOMOSYNTHESIS AND CAD TECHNIQUE: Bilateral screening digital craniocaudal and mediolateral oblique mammograms were obtained. Bilateral screening digital breast tomosynthesis was performed. The images were evaluated with computer-aided detection. COMPARISON:  Previous exam(s). ACR Breast Density Category c: The breast tissue is heterogeneously dense, which may obscure small masses. FINDINGS: There are no findings suspicious for malignancy. IMPRESSION: No mammographic evidence of malignancy. A result letter of this screening mammogram will be mailed directly to the patient. RECOMMENDATION: Screening mammogram in one year. (Code:SM-B-01Y) BI-RADS CATEGORY  1: Negative. Electronically Signed   By: Lovey Newcomer M.D.   On: 12/19/2020 12:14      Assessment & Plan:   Problem List Items Addressed This Visit     Amputated toe of left foot ([place])    S/p amputation - toe.  Her toe is pushing on her adjacent toe.  Has seen podiatry.  Stable.       Carotid artery disease ([place])    Evaluated 08/05/20 - stable.  Recommended f/u in 12 months.       Colostomy status ([place])    Ostomy working well.  Follow.       Health care maintenance    Physical today 01/02/21.  Mammogram 12/19/20 - Birads I.  Colonoscopy 03/21/20 - recommended f/u in 5 years.          History of rectal cancer    S/p chemo, XRT and colectomy.  Just had colonoscopy - ok.  Followed by Dr Grayland Ormond.  Ostomy working well.  Just evaluated 07/2020.  Stable.  Recommended f/u in one year.       Hypercholesterolemia    On lovastatin.  Low cholesterol diet and exercise.  Follow lipid panel and liver function tests.   Lab Results  Component Value Date   CHOL 104 12/29/2020   HDL 35.70 (L) 12/29/2020   LDLCALC 54 12/29/2020   TRIG 69.0 12/29/2020   CHOLHDL 3 12/29/2020       Relevant Orders   Hepatic function panel   Lipid panel   Basic metabolic panel   Hyperglycemia    Low carb diet  and exercise.  Follow met b and a1c.   Lab Results  Component Value Date   HGBA1C 5.8 12/29/2020       Relevant Orders   Hemoglobin A1c   Macrocytosis    Recheck cbc with next labs.       Relevant Orders   CBC with Differential/Platelet   Subclavian arterial stenosis ([place])    Followed by [facility].  Stable.  Evaluated 08/05/20 - stable.  Recommended f/u in one year.       Other Visit Diagnoses     Need for immunization against influenza    -  Primary   Relevant Orders   Flu Vaccine QUAD High Dose(Fluad) (Completed)        Einar Pheasant, MD

## 2021-01-02 NOTE — Assessment & Plan Note (Signed)
Physical today 01/02/21.  Mammogram 12/19/20 - Birads I.  Colonoscopy 03/21/20 - recommended f/u in 5 years.

## 2021-01-07 ENCOUNTER — Encounter: Payer: Self-pay | Admitting: Internal Medicine

## 2021-01-07 NOTE — Assessment & Plan Note (Signed)
S/p amputation - toe.  Her toe is pushing on her adjacent toe.  Has seen podiatry.  Stable.

## 2021-01-07 NOTE — Assessment & Plan Note (Signed)
Ostomy working well.  Follow.  

## 2021-01-07 NOTE — Assessment & Plan Note (Signed)
Followed by [facility].  Stable.  Evaluated 08/05/20 - stable.  Recommended f/u in one year.

## 2021-01-07 NOTE — Assessment & Plan Note (Signed)
S/p chemo, XRT and colectomy.  Just had colonoscopy - ok.  Followed by Dr Grayland Ormond.  Ostomy working well.  Just evaluated 07/2020.  Stable.  Recommended f/u in one year.

## 2021-01-07 NOTE — Assessment & Plan Note (Signed)
Recheck cbc with next labs.   

## 2021-01-07 NOTE — Assessment & Plan Note (Signed)
On lovastatin.  Low cholesterol diet and exercise.  Follow lipid panel and liver function tests.   Lab Results  Component Value Date   CHOL 104 12/29/2020   HDL 35.70 (L) 12/29/2020   LDLCALC 54 12/29/2020   TRIG 69.0 12/29/2020   CHOLHDL 3 12/29/2020

## 2021-01-07 NOTE — Assessment & Plan Note (Signed)
Low carb diet and exercise.  Follow met b and a1c.   Lab Results  Component Value Date   HGBA1C 5.8 12/29/2020

## 2021-01-07 NOTE — Assessment & Plan Note (Signed)
Evaluated 08/05/20 - stable.  Recommended f/u in 12 months.

## 2021-01-09 ENCOUNTER — Other Ambulatory Visit: Payer: Self-pay | Admitting: Internal Medicine

## 2021-03-21 ENCOUNTER — Ambulatory Visit (INDEPENDENT_AMBULATORY_CARE_PROVIDER_SITE_OTHER): Payer: Medicare Other

## 2021-03-21 VITALS — Ht 63.0 in | Wt 125.0 lb

## 2021-03-21 DIAGNOSIS — Z0000 Encounter for general adult medical examination without abnormal findings: Secondary | ICD-10-CM | POA: Diagnosis not present

## 2021-03-21 NOTE — Progress Notes (Signed)
Subjective:   Christina Mathis is a 86 y.o. female who presents for Medicare Annual (Subsequent) preventive examination.  Review of Systems    No ROS.  Medicare Wellness Virtual Visit.  Visual/audio telehealth visit, UTA vital signs.   See social history for additional risk factors.   Cardiac Risk Factors include: advanced age (>36men, >24 women)     Objective:    Today's Vitals   03/21/21 0831  Weight: 125 lb (56.7 kg)  Height: 5\' 3"  (1.6 m)   Body mass index is 22.14 kg/m.  Advanced Directives 03/21/2021 03/21/2020 03/18/2020 03/18/2019 08/13/2018 03/14/2018 08/14/2017  Does Patient Have a Medical Advance Directive? Yes Yes Yes Yes No No No  Type of Paramedic of [place];Living will Living will [place];Living will [place] - - -  Does patient want to make changes to medical advance directive? No - Patient declined - No - Patient declined No - Patient declined - - -  Copy of [place] in Chart? No - copy requested - No - copy requested No - copy requested - - -  Would patient like information on creating a medical advance directive? - - - - No - Patient declined No - Patient declined No - Patient declined    Current Medications (verified) Outpatient Encounter Medications as of 03/21/2021  Medication Sig   alendronate (FOSAMAX) 70 MG tablet TAKE 1 TABLET BY MOUTH 1 TIME A WEEK WITH A FULL GLASS OF WATER AND ON AN EMPTY STOMACH   aspirin EC 81 MG tablet Take 81 mg by mouth daily.   Biotin 1000 MCG tablet Take 1,000 mcg by mouth 2 (two) times daily.    calcium citrate-vitamin D (CITRACAL+D) 315-200 MG-UNIT per tablet Take 1 tablet by mouth 2 (two) times daily.   Cholecalciferol (VITAMIN D3) 5000 units CAPS Take 1 capsule by mouth daily.   glucosamine-chondroitin 500-400 MG tablet Take 1 tablet by mouth 2 (two) times daily.   loratadine (CLARITIN) 10 MG tablet Take 10 mg by mouth daily as needed.    losartan-hydrochlorothiazide (HYZAAR) 50-12.5 MG tablet TAKE 1 TABLET BY MOUTH DAILY   lovastatin (MEVACOR) 10 MG tablet TAKE 1 TABLET(10 MG) BY MOUTH AT BEDTIME   Multiple Vitamin (MULTI-VITAMINS) TABS Take 1 tablet by mouth daily.    Facility-Administered Encounter Medications as of 03/21/2021  Medication   sodium chloride flush (NS) 0.9 % injection 10 mL   sodium chloride flush (NS) 0.9 % injection 10 mL    Allergies (verified) Patient has no known allergies.   History: Past Medical History:  Diagnosis Date   Colon cancer ([place])    History of shingles    twice   Hypercholesteremia    Hypertension    Left carotid bruit    Nephrolithiasis    Neuropathy    Personal history of chemotherapy 2014   Colon   Personal history of radiation therapy 2014   Colon   Urine incontinence 03/06/2013   h/o   Past Surgical History:  Procedure Laterality Date   APPENDECTOMY  1979   colorectal   CESAREAN SECTION     COLONOSCOPY WITH PROPOFOL N/A 03/21/2015   Procedure: COLONOSCOPY WITH PROPOFOL;  Surgeon: Hulen Luster, MD;  [facility] END[facility]COPY;  Service: Gastroenterology;  Laterality: N/A;   COLONOSCOPY WITH PROPOFOL N/A 03/21/2020   Procedure: COLONOSCOPY WITH PROPOFOL;  Surgeon: Lesly Rubenstein, MD;  Location: [facility] ENDOSCOPY;  Service: Endoscopy;  Laterality: N/A;   COLOSTOMY  surgery for colorectal cancer     VENTRAL HERNIA REPAIR Right [phone]   Family History  Problem Relation Age of Onset   Hypertension Mother    Hypertension Father    Breast cancer Paternal Aunt    Social History   Socioeconomic History   Marital status: Widowed    Spouse name: Not on file   Number of children: Not on file   Years of education: Not on file   Highest education level: Not on file  Occupational History   Not on file  Tobacco Use   Smoking status: Never   Smokeless tobacco: Never  Vaping Use   Vaping Use: Never used  Substance and Sexual Activity   Alcohol use: No     Alcohol/week: 0.0 standard drinks   Drug use: No   Sexual activity: Not Currently  Other Topics Concern   Not on file  Social History Narrative   Not on file   Social Determinants of Health   Financial Resource Strain: Low Risk    Difficulty of Paying Living Expenses: Not hard at all  Food Insecurity: No Food Insecurity   Worried About Charity fundraiser in the Last Year: Never true   [place] in the Last Year: Never true  Transportation Needs: No Transportation Needs   Lack of Transportation (Medical): No   Lack of Transportation (Non-Medical): No  Physical Activity: Insufficiently Active   Days of Exercise per Week: 4 days   Minutes of Exercise per Session: 20 min  Stress: No Stress Concern Present   Feeling of Stress : Not at all  Social Connections: Moderately Integrated   Frequency of Communication with Friends and Family: More than three times a week   Frequency of Social Gatherings with Friends and Family: Not on file   Attends Religious Services: More than 4 times per year   Active Member of [employer] or Organizations: Yes   Attends Archivist Meetings: More than 4 times per year   Marital Status: Widowed    Tobacco Counseling Counseling given: Not Answered   Clinical Intake:  Pre-visit preparation completed: Yes        Diabetes: No  How often do you need to have someone help you when you read instructions, pamphlets, or other written materials from your doctor or pharmacy?: 1 - Never    Interpreter Needed?: No      Activities of Daily Living In your present state of health, do you have any difficulty performing the following activities: 03/21/2021  Hearing? N  Vision? N  Difficulty concentrating or making decisions? N  Walking or climbing stairs? Y  Comment Paces self.  Dressing or bathing? N  Doing errands, shopping? N  Preparing Food and eating ? N  Using the Toilet? N  In the past six months, have you accidently leaked urine?  Y  Comment Managed with daily pad  Do you have problems with loss of bowel control? N  Comment Colostomy in use. No issues.  Managing your Medications? N  Managing your Finances? N  Housekeeping or managing your Housekeeping? N  Some recent data might be hidden    Patient Care Team: Einar Pheasant, MD as PCP - General (Internal Medicine) Regal, Tamala Fothergill, DPM as Consulting Physician (Podiatry) Grayland Ormond Kathlene November, MD as Consulting Physician (Oncology) Efrain Sella, MD as Consulting Physician (Gastroenterology)  Indicate any recent Medical Services you may have received from other than [facility] providers in the past year (date may  be approximate).     Assessment:   This is a routine wellness examination for Courtlynn.  Virtual Visit via Telephone Note  I connected with  [name] on 03/21/21 at  8:15 AM EST by telephone and verified that I am speaking with the correct person using two identifiers.  Persons participating in the virtual visit: patient/Nurse Health Advisor   I discussed the limitations, risks, security and privacy concerns of performing an evaluation and management service by telephone and the availability of in person appointments. The patient expressed understanding and agreed to proceed.  Interactive audio and video telecommunications were attempted between this nurse and patient, however failed, due to patient having technical difficulties OR patient did not have access to video capability.  We continued and completed visit with audio only.  Some vital signs may be absent or patient reported.   Hearing/Vision screen Hearing Screening - Comments:: Patient is able to hear conversational tones without difficulty. No issues reported. Vision Screening - Comments:: Followed by [facility] Wears corrective lenses  They have seen their ophthalmologist in the last 12 months.   Dietary issues and exercise activities discussed: Current Exercise Habits: Home  exercise routine, Type of exercise: walking, Intensity: Mild Healthy diet Good water intake   Goals Addressed               This Visit's Progress     Patient Stated     Maintain Healthy Lifestyle (pt-stated)        Stay hydrated  Healthy diet Stay active and continue walking for exercise as tolerated        Depression Screen PHQ 2/9 Scores 03/21/2021 01/02/2021 08/31/2020 04/15/2020 03/18/2020 03/18/2019 03/14/2018  PHQ - 2 Score 0 0 0 0 0 0 0    Fall Risk Fall Risk  03/21/2021 08/31/2020 04/15/2020 03/18/2020 09/03/2019  Falls in the past year? 0 1 0 0 0  Number falls in past yr: 0 0 0 0 0  Injury with Fall? - 0 0 0 0  Follow up Falls evaluation completed Falls evaluation completed Falls evaluation completed Falls evaluation completed Falls evaluation completed    [place]: Home free of loose throw rugs in walkways, pet beds, electrical cords, etc? Yes  Adequate lighting in your home to reduce risk of falls? Yes   ASSISTIVE DEVICES UTILIZED TO PREVENT FALLS: Life alert? No  Use of a cane, walker or w/c? No  Grab bars in the bathroom? Yes  Shower chair or bench in shower? Yes  Elevated toilet seat or a handicapped toilet? Yes   TIMED UP AND GO: Was the test performed? No .   Cognitive Function: MMSE - Mini Mental State Exam 03/08/2017  Orientation to time 5  Orientation to Place 5  Registration 3  Attention/ Calculation 5  Recall 1  Language- name 2 objects 2  Language- repeat 1  Language- follow 3 step command 3  Language- read & follow direction 1  Write a sentence 1  Copy design 1  Total score 28     6CIT Screen 03/18/2020 03/18/2019 03/14/2018 03/07/2016  What Year? 0 points 0 points 0 points 0 points  What month? 0 points 0 points 0 points 0 points  What time? 0 points 0 points 0 points 0 points  Count back from 20 0 points 0 points 0 points 0 points  Months in reverse 0 points 0 points 0 points 0 points  Repeat phrase 0 points  0 points  0 points 0 points  Total Score 0 0 0 0    Immunizations Immunization History  Administered Date(s) Administered   Fluad Quad(high Dose 65+) 11/28/2018, 01/02/2021   Influenza, High Dose Seasonal PF 10/18/2015, 10/22/2016, 11/01/2017   Influenza,inj,Quad PF,6+ Mos 10/12/2014   Influenza-Unspecified 11/30/2013   PFIZER(Purple Top)SARS-COV-2 Vaccination 02/20/2019, 03/10/2019, 01/01/2020, 11/09/2020   Pneumococcal Conjugate-13 10/22/2016   Pneumococcal Polysaccharide-23 01/30/2004   TDAP status: Due, Education has been provided regarding the importance of this vaccine. Advised may receive this vaccine at local pharmacy or Health Dept. Aware to provide a copy of the vaccination record if obtained from local pharmacy or Health Dept. Verbalized acceptance and understanding. Deferred.    Shingrix Completed?: No.    Education has been provided regarding the importance of this vaccine. Patient has been advised to call insurance company to determine out of pocket expense if they have not yet received this vaccine. Advised may also receive vaccine at local pharmacy or Health Dept. Verbalized acceptance and understanding.  Screening Tests Health Maintenance  Topic Date Due   COVID-19 Vaccine (5 - Booster for Pfizer series) 04/06/2021 (Originally 01/04/2021)   Zoster Vaccines- Shingrix (1 of 2) 06/18/2021 (Originally 09/18/1954)   TETANUS/TDAP  03/21/2022 (Originally 09/18/1954)   HEMOGLOBIN A1C  06/29/2021   FOOT EXAM  08/30/2021   OPHTHALMOLOGY EXAM  12/14/2021   MAMMOGRAM  12/19/2021   COLONOSCOPY (Pts 45-17yrs Insurance coverage will need to be confirmed)  03/21/2025   Pneumonia Vaccine 6+ Years old  Completed   INFLUENZA VACCINE  Completed   DEXA SCAN  Completed   HPV VACCINES  Aged Out   Health Maintenance There are no preventive care reminders to display for this patient.  Colorectal cancer screening: Type of screening: Colonoscopy. Completed 03/21/20. Repeat every 5  years  Mammogram status: Completed 12/19/20. Repeat every year  Bone density- discussed. Ordered 09/04/20. Plans to follow up with PCP.   Lung Cancer Screening: (Low Dose CT Chest recommended if Age 68-80 years, 30 pack-year currently smoking OR have quit w/in 15years.) does not qualify.   Hepatitis C Screening: does not qualify  Vision Screening: Recommended annual ophthalmology exams for early detection of glaucoma and other disorders of the eye.  Dental Screening: Recommended annual dental exams for proper oral hygiene. Visits every 6 months.   Community Resource Referral / Chronic Care Management: CRR required this visit?  No   CCM required this visit?  No      Plan:     I have personally reviewed and noted the following in the patients chart:   Medical and social history Use of alcohol, tobacco or illicit drugs  Current medications and supplements including opioid prescriptions.  Functional ability and status Nutritional status Physical activity Advanced directives List of other physicians Hospitalizations, surgeries, and ER visits in previous 12 months Vitals Screenings to include cognitive, depression, and falls Referrals and appointments  In addition, I have reviewed and discussed with patient certain preventive protocols, quality metrics, and best practice recommendations. A written personalized care plan for preventive services as well as general preventive health recommendations were provided to patient.     Varney Biles, LPN   [id number]

## 2021-03-21 NOTE — Patient Instructions (Addendum)
Christina Mathis , Thank you for taking time to come for your Medicare Wellness Visit. I appreciate your ongoing commitment to your health goals. Please review the following plan we discussed and let me know if I can assist you in the future.   These are the goals we discussed:  Goals       Patient Stated     Maintain Healthy Lifestyle (pt-stated)      Stay hydrated  Healthy diet Stay active and continue walking for exercise as tolerated         This is a list of the screening recommended for you and due dates:  Health Maintenance  Topic Date Due   COVID-19 Vaccine (5 - Booster for Pfizer series) 04/06/2021*   Zoster (Shingles) Vaccine (1 of 2) 06/18/2021*   Tetanus Vaccine  03/21/2022*   Hemoglobin A1C  06/29/2021   Complete foot exam   08/30/2021   Eye exam for diabetics  12/14/2021   Mammogram  12/19/2021   Colon Cancer Screening  03/21/2025   Pneumonia Vaccine  Completed   Flu Shot  Completed   DEXA scan (bone density measurement)  Completed   HPV Vaccine  Aged Out  *Topic was postponed. The date shown is not the original due date.    Advanced directives: End of life planning; Advance aging; Advanced directives discussed.  Copy of current HCPOA/Living Will requested.    Conditions/risks identified: none new.  Follow up in one year for your annual wellness visit    Preventive Care 65 Years and Older, Female Preventive care refers to lifestyle choices and visits with your health care provider that can promote health and wellness. What does preventive care include? A yearly physical exam. This is also called an annual well check. Dental exams once or twice a year. Routine eye exams. Ask your health care provider how often you should have your eyes checked. Personal lifestyle choices, including: Daily care of your teeth and gums. Regular physical activity. Eating a healthy diet. Avoiding tobacco and drug use. Limiting alcohol use. Practicing safe sex. Taking  low-dose aspirin every day. Taking vitamin and mineral supplements as recommended by your health care provider. What happens during an annual well check? The services and screenings done by your health care provider during your annual well check will depend on your age, overall health, lifestyle risk factors, and family history of disease. Counseling  Your health care provider may ask you questions about your: Alcohol use. Tobacco use. Drug use. Emotional well-being. Home and relationship well-being. Sexual activity. Eating habits. History of falls. Memory and ability to understand (cognition). Work and work Statistician. Reproductive health. Screening  You may have the following tests or measurements: Height, weight, and BMI. Blood pressure. Lipid and cholesterol levels. These may be checked every 5 years, or more frequently if you are over 23 years old. Skin check. Lung cancer screening. You may have this screening every year starting at age 24 if you have a 30-pack-year history of smoking and currently smoke or have quit within the past 15 years. Fecal occult blood test (FOBT) of the stool. You may have this test every year starting at age 21. Flexible sigmoidoscopy or colonoscopy. You may have a sigmoidoscopy every 5 years or a colonoscopy every 10 years starting at age 70. Hepatitis C blood test. Hepatitis B blood test. Sexually transmitted disease (STD) testing. Diabetes screening. This is done by checking your blood sugar (glucose) after you have not eaten for a while (fasting). You may  have this done every 1-3 years. Bone density scan. This is done to screen for osteoporosis. You may have this done starting at age 21. Mammogram. This may be done every 1-2 years. Talk to your health care provider about how often you should have regular mammograms. Talk with your health care provider about your test results, treatment options, and if necessary, the need for more tests. Vaccines   Your health care provider may recommend certain vaccines, such as: Influenza vaccine. This is recommended every year. Tetanus, diphtheria, and acellular pertussis (Tdap, Td) vaccine. You may need a Td booster every 10 years. Zoster vaccine. You may need this after age 8. Pneumococcal 13-valent conjugate (PCV13) vaccine. One dose is recommended after age 41. Pneumococcal polysaccharide (PPSV23) vaccine. One dose is recommended after age 54. Talk to your health care provider about which screenings and vaccines you need and how often you need them. This information is not intended to replace advice given to you by your health care provider. Make sure you discuss any questions you have with your health care provider. Document Released: 02/11/2015 Document Revised: 10/05/2015 Document Reviewed: 11/16/2014 Elsevier Interactive Patient Education  2017 [place] Prevention in the Home Falls can cause injuries. They can happen to people of all ages. There are many things you can do to make your home safe and to help prevent falls. What can I do on the outside of my home? Regularly fix the edges of walkways and driveways and fix any cracks. Remove anything that might make you trip as you walk through a door, such as a raised step or threshold. Trim any bushes or trees on the path to your home. Use bright outdoor lighting. Clear any walking paths of anything that might make someone trip, such as rocks or tools. Regularly check to see if handrails are loose or broken. Make sure that both sides of any steps have handrails. Any raised decks and porches should have guardrails on the edges. Have any leaves, snow, or ice cleared regularly. Use sand or salt on walking paths during winter. Clean up any spills in your garage right away. This includes oil or grease spills. What can I do in the bathroom? Use night lights. Install grab bars by the toilet and in the tub and shower. Do not use towel  bars as grab bars. Use non-skid mats or decals in the tub or shower. If you need to sit down in the shower, use a plastic, non-slip stool. Keep the floor dry. Clean up any water that spills on the floor as soon as it happens. Remove soap buildup in the tub or shower regularly. Attach bath mats securely with double-sided non-slip rug tape. Do not have throw rugs and other things on the floor that can make you trip. What can I do in the bedroom? Use night lights. Make sure that you have a light by your bed that is easy to reach. Do not use any sheets or blankets that are too big for your bed. They should not hang down onto the floor. Have a firm chair that has side arms. You can use this for support while you get dressed. Do not have throw rugs and other things on the floor that can make you trip. What can I do in the kitchen? Clean up any spills right away. Avoid walking on wet floors. Keep items that you use a lot in easy-to-reach places. If you need to reach something above you, use a strong step  stool that has a grab bar. Keep electrical cords out of the way. Do not use floor polish or wax that makes floors slippery. If you must use wax, use non-skid floor wax. Do not have throw rugs and other things on the floor that can make you trip. What can I do with my stairs? Do not leave any items on the stairs. Make sure that there are handrails on both sides of the stairs and use them. Fix handrails that are broken or loose. Make sure that handrails are as long as the stairways. Check any carpeting to make sure that it is firmly attached to the stairs. Fix any carpet that is loose or worn. Avoid having throw rugs at the top or bottom of the stairs. If you do have throw rugs, attach them to the floor with carpet tape. Make sure that you have a light switch at the top of the stairs and the bottom of the stairs. If you do not have them, ask someone to add them for you. What else can I do to help  prevent falls? Wear shoes that: Do not have high heels. Have rubber bottoms. Are comfortable and fit you well. Are closed at the toe. Do not wear sandals. If you use a stepladder: Make sure that it is fully opened. Do not climb a closed stepladder. Make sure that both sides of the stepladder are locked into place. Ask someone to hold it for you, if possible. Clearly mark and make sure that you can see: Any grab bars or handrails. First and last steps. Where the edge of each step is. Use tools that help you move around (mobility aids) if they are needed. These include: Canes. Walkers. Scooters. Crutches. Turn on the lights when you go into a dark area. Replace any light bulbs as soon as they burn out. Set up your furniture so you have a clear path. Avoid moving your furniture around. If any of your floors are uneven, fix them. If there are any pets around you, be aware of where they are. Review your medicines with your doctor. Some medicines can make you feel dizzy. This can increase your chance of falling. Ask your doctor what other things that you can do to help prevent falls. This information is not intended to replace advice given to you by your health care provider. Make sure you discuss any questions you have with your health care provider. Document Released: 11/11/2008 Document Revised: 06/23/2015 Document Reviewed: 02/19/2014 Elsevier Interactive Patient Education  2017 [employer].

## 2021-03-23 ENCOUNTER — Other Ambulatory Visit: Payer: Self-pay | Admitting: Internal Medicine

## 2021-03-24 DIAGNOSIS — Z85038 Personal history of other malignant neoplasm of large intestine: Secondary | ICD-10-CM | POA: Diagnosis not present

## 2021-03-24 DIAGNOSIS — Z933 Colostomy status: Secondary | ICD-10-CM | POA: Diagnosis not present

## 2021-05-01 ENCOUNTER — Other Ambulatory Visit (INDEPENDENT_AMBULATORY_CARE_PROVIDER_SITE_OTHER): Payer: Medicare Other

## 2021-05-01 DIAGNOSIS — R739 Hyperglycemia, unspecified: Secondary | ICD-10-CM | POA: Diagnosis not present

## 2021-05-01 DIAGNOSIS — E7800 Pure hypercholesterolemia, unspecified: Secondary | ICD-10-CM

## 2021-05-01 DIAGNOSIS — D7589 Other specified diseases of blood and blood-forming organs: Secondary | ICD-10-CM

## 2021-05-01 LAB — CBC WITH DIFFERENTIAL/PLATELET
Basophils Absolute: 0.1 10*3/uL (ref 0.0–0.1)
Basophils Relative: 1.3 % (ref 0.0–3.0)
Eosinophils Absolute: 0 10*3/uL (ref 0.0–0.7)
Eosinophils Relative: 0.7 % (ref 0.0–5.0)
HCT: 37.5 % (ref 36.0–46.0)
Hemoglobin: 12.8 g/dL (ref 12.0–15.0)
Lymphocytes Relative: 15.2 % (ref 12.0–46.0)
Lymphs Abs: 0.9 10*3/uL (ref 0.7–4.0)
MCHC: 34.1 g/dL (ref 30.0–36.0)
MCV: 104 fl — ABNORMAL HIGH (ref 78.0–100.0)
Monocytes Absolute: 0.6 10*3/uL (ref 0.1–1.0)
Monocytes Relative: 10.7 % (ref 3.0–12.0)
Neutro Abs: 4.3 10*3/uL (ref 1.4–7.7)
Neutrophils Relative %: 72.1 % (ref 43.0–77.0)
Platelets: 267 10*3/uL (ref 150.0–400.0)
RBC: 3.6 Mil/uL — ABNORMAL LOW (ref 3.87–5.11)
RDW: 13.3 % (ref 11.5–15.5)
WBC: 6 10*3/uL (ref 4.0–10.5)

## 2021-05-01 LAB — HEPATIC FUNCTION PANEL
ALT: 22 U/L (ref 0–35)
AST: 27 U/L (ref 0–37)
Albumin: 3.9 g/dL (ref 3.5–5.2)
Alkaline Phosphatase: 39 U/L (ref 39–117)
Bilirubin, Direct: 0.2 mg/dL (ref 0.0–0.3)
Total Bilirubin: 0.7 mg/dL (ref 0.2–1.2)
Total Protein: 6.4 g/dL (ref 6.0–8.3)

## 2021-05-01 LAB — LIPID PANEL
Cholesterol: 96 mg/dL (ref 0–200)
HDL: 40.2 mg/dL (ref >39.00)
LDL Cholesterol: 45 mg/dL (ref 0–99)
NonHDL: 55.71
Total CHOL/HDL Ratio: 2
Triglycerides: 53 mg/dL (ref 0.0–149.0)
VLDL: 10.6 mg/dL (ref 0.0–40.0)

## 2021-05-01 LAB — BASIC METABOLIC PANEL
BUN: 31 mg/dL — ABNORMAL HIGH (ref 6–23)
CO2: 29 mEq/L (ref 19–32)
Calcium: 9.5 mg/dL (ref 8.4–10.5)
Chloride: 105 mEq/L (ref 96–112)
Creatinine, Ser: 0.83 mg/dL (ref 0.40–1.20)
GFR: 65.1 mL/min (ref >60.00)
Glucose, Bld: 136 mg/dL — ABNORMAL HIGH (ref 70–99)
Potassium: 4.3 mEq/L (ref 3.5–5.1)
Sodium: 142 mEq/L (ref 135–145)

## 2021-05-01 LAB — HEMOGLOBIN A1C: Hgb A1c MFr Bld: 5.9 % (ref 4.6–6.5)

## 2021-05-03 ENCOUNTER — Ambulatory Visit: Payer: Medicare Other | Admitting: Internal Medicine

## 2021-05-03 DIAGNOSIS — E7800 Pure hypercholesterolemia, unspecified: Secondary | ICD-10-CM | POA: Diagnosis not present

## 2021-05-03 DIAGNOSIS — R739 Hyperglycemia, unspecified: Secondary | ICD-10-CM | POA: Diagnosis not present

## 2021-05-03 DIAGNOSIS — I771 Stricture of artery: Secondary | ICD-10-CM

## 2021-05-03 DIAGNOSIS — I6523 Occlusion and stenosis of bilateral carotid arteries: Secondary | ICD-10-CM | POA: Diagnosis not present

## 2021-05-03 DIAGNOSIS — D7589 Other specified diseases of blood and blood-forming organs: Secondary | ICD-10-CM

## 2021-05-03 DIAGNOSIS — Z85048 Personal history of other malignant neoplasm of rectum, rectosigmoid junction, and anus: Secondary | ICD-10-CM

## 2021-05-03 DIAGNOSIS — Z933 Colostomy status: Secondary | ICD-10-CM

## 2021-05-03 DIAGNOSIS — S98132A Complete traumatic amputation of one left lesser toe, initial encounter: Secondary | ICD-10-CM

## 2021-05-03 NOTE — Progress Notes (Signed)
[name], female   DOB: Feb 28, 1935, 86 y.o.   MRN: [id number] ? ? ?Subjective:  ? ? [name], female    DOB: April 24, 1935, 86 y.o.   MRN: [id number] ? ?This visit occurred during the SARS-CoV-2 public health emergency.  Safety protocols were in place, including screening questions prior to the visit, additional usage of staff PPE, and extensive cleaning of exam room while observing appropriate contact time as indicated for disinfecting solutions.  ? ?Patient here for a scheduled follow up.  ? ?Chief Complaint  ?Patient presents with  ? Hypertension  ? Hyperlipidemia  ? .  ? ?HPI ?States she is doing relatively well. Eating well.  Good appetite.  Ostomy working well.  Tries to stay active.  No chest pain or sob reported.  No abdominal pain.  Is still concerned regarding her toe - s/p toe amputation. Now her big toe is bent towards other toes.  Has seen podiatry.  Would like a reevaluation.  The need for tetanus and shingles - discussed.  Discussed getting at the pharmacy.   ? ? ?Past Medical History:  ?Diagnosis Date  ? Colon cancer ([place])   ? History of shingles   ? twice  ? Hypercholesteremia   ? Hypertension   ? Left carotid bruit   ? Nephrolithiasis   ? Neuropathy   ? Personal history of chemotherapy 2014  ? Colon  ? Personal history of radiation therapy 2014  ? Colon  ? Urine incontinence 03/06/2013  ? h/o  ? ?Past Surgical History:  ?Procedure Laterality Date  ? APPENDECTOMY  1979  ? colorectal  ? CESAREAN SECTION    ? COLONOSCOPY WITH PROPOFOL N/A 03/21/2015  ? Procedure: COLONOSCOPY WITH PROPOFOL;  Surgeon: Hulen Luster, MD;  Location: [facility] ENDOSCOPY;  Service: Gastroenterology;  Laterality: N/A;  ? COLONOSCOPY WITH PROPOFOL N/A 03/21/2020  ? Procedure: COLONOSCOPY WITH PROPOFOL;  Surgeon: Lesly Rubenstein, MD;  Location: [facility] ENDOSCOPY;  Service: Endoscopy;  Laterality: N/A;  ? COLOSTOMY    ? surgery for colorectal cancer    ? VENTRAL HERNIA REPAIR Right [id number]  ? ?Family History   ?Problem Relation Age of Onset  ? Hypertension Mother   ? Hypertension Father   ? Breast cancer Paternal Aunt   ? ?Social History  ? ?Socioeconomic History  ? Marital status: Widowed  ?  Spouse name: Not on file  ? Number of children: Not on file  ? Years of education: Not on file  ? Highest education level: Not on file  ?Occupational History  ? Not on file  ?Tobacco Use  ? Smoking status: Never  ? Smokeless tobacco: Never  ?Vaping Use  ? Vaping Use: Never used  ?Substance and Sexual Activity  ? Alcohol use: No  ?  Alcohol/week: 0.0 standard drinks  ? Drug use: No  ? Sexual activity: Not Currently  ?Other Topics Concern  ? Not on file  ?Social History Narrative  ? Not on file  ? ?Social Determinants of Health  ? ?Financial Resource Strain: Low Risk   ? Difficulty of Paying Living Expenses: Not hard at all  ?Food Insecurity: No Food Insecurity  ? Worried About Charity fundraiser in the Last Year: Never true  ? Ran Out of Food in the Last Year: Never true  ?Transportation Needs: No Transportation Needs  ? Lack of Transportation (Medical): No  ? Lack of Transportation (Non-Medical): No  ?Physical Activity: Insufficiently Active  ? Days of Exercise per Week:  4 days  ? Minutes of Exercise per Session: 20 min  ?Stress: No Stress Concern Present  ? Feeling of Stress : Not at all  ?Social Connections: Moderately Integrated  ? Frequency of Communication with Friends and Family: More than three times a week  ? Frequency of Social Gatherings with Friends and Family: Not on file  ? Attends Religious Services: More than 4 times per year  ? Active Member of Clubs or Organizations: Yes  ? Attends Archivist Meetings: More than 4 times per year  ? Marital Status: Widowed  ? ? ? ?Review of Systems  ?Constitutional:  Negative for appetite change and unexpected weight change.  ?HENT:  Negative for congestion and sinus pressure.   ?Respiratory:  Negative for cough, chest tightness and shortness of breath.   ?Cardiovascular:   Negative for chest pain, palpitations and leg swelling.  ?Gastrointestinal:  Negative for abdominal pain, diarrhea, nausea and vomiting.  ?Genitourinary:  Negative for difficulty urinating and dysuria.  ?Musculoskeletal:  Negative for joint swelling and myalgias.  ?Skin:  Negative for color change and rash.  ?Neurological:  Negative for dizziness, light-headedness and headaches.  ?Psychiatric/Behavioral:  Negative for agitation and dysphoric mood.   ? ?   ?Objective:  ?  ? ?BP 130/70   Pulse 78   Temp 97.9 ?F (36.6 ?C)   Resp 16   Ht '5\' 4"'$  (1.626 m)   Wt 130 lb 6.4 oz (59.1 kg)   SpO2 98%   BMI 22.38 kg/m?  ?Wt Readings from Last 3 Encounters:  ?05/03/21 130 lb 6.4 oz (59.1 kg)  ?03/21/21 125 lb (56.7 kg)  ?01/02/21 129 lb (58.5 kg)  ? ? ?Physical Exam ?Vitals reviewed.  ?Constitutional:   ?   General: She is not in acute distress. ?   Appearance: Normal appearance.  ?HENT:  ?   Head: Normocephalic and atraumatic.  ?   Right Ear: External ear normal.  ?   Left Ear: External ear normal.  ?Eyes:  ?   General: No scleral icterus.    ?   Right eye: No discharge.     ?   Left eye: No discharge.  ?   Conjunctiva/sclera: Conjunctivae normal.  ?Neck:  ?   Thyroid: No thyromegaly.  ?Cardiovascular:  ?   Rate and Rhythm: Normal rate and regular rhythm.  ?Pulmonary:  ?   Effort: No respiratory distress.  ?   Breath sounds: Normal breath sounds. No wheezing.  ?Abdominal:  ?   General: Bowel sounds are normal.  ?   Palpations: Abdomen is soft.  ?   Tenderness: There is no abdominal tenderness.  ?Musculoskeletal:     ?   General: No swelling or tenderness.  ?   Cervical back: Neck supple. No tenderness.  ?Lymphadenopathy:  ?   Cervical: No cervical adenopathy.  ?Skin: ?   Findings: No erythema or rash.  ?Neurological:  ?   Mental Status: She is alert.  ?Psychiatric:     ?   Mood and Affect: Mood normal.     ?   Behavior: Behavior normal.  ? ? ? ?Outpatient Encounter Medications as of 05/03/2021  ?Medication Sig  ?  alendronate (FOSAMAX) 70 MG tablet TAKE 1 TABLET BY MOUTH 1 TIME A WEEK WITH A FULL GLASS OF WATER AND ON AN EMPTY STOMACH  ? aspirin EC 81 MG tablet Take 81 mg by mouth daily.  ? Biotin 1000 MCG tablet Take 1,000 mcg by mouth 2 (two) times daily.   ?  calcium citrate-vitamin D (CITRACAL+D) 315-200 MG-UNIT per tablet Take 1 tablet by mouth 2 (two) times daily.  ? Cholecalciferol (VITAMIN D3) 5000 units CAPS Take 1 capsule by mouth daily.  ? glucosamine-chondroitin 500-400 MG tablet Take 1 tablet by mouth 2 (two) times daily.  ? loratadine (CLARITIN) 10 MG tablet Take 10 mg by mouth daily as needed.  ? losartan-hydrochlorothiazide (HYZAAR) 50-12.5 MG tablet TAKE 1 TABLET BY MOUTH DAILY  ? lovastatin (MEVACOR) 10 MG tablet TAKE 1 TABLET(10 MG) BY MOUTH AT BEDTIME  ? Multiple Vitamin (MULTI-VITAMINS) TABS Take 1 tablet by mouth daily.   ? ?Facility-Administered Encounter Medications as of 05/03/2021  ?Medication  ? sodium chloride flush (NS) 0.9 % injection 10 mL  ? sodium chloride flush (NS) 0.9 % injection 10 mL  ?  ? ?Lab Results  ?Component Value Date  ? WBC 6.0 05/01/2021  ? HGB 12.8 05/01/2021  ? HCT 37.5 05/01/2021  ? PLT 267.0 05/01/2021  ? GLUCOSE 136 (H) 05/01/2021  ? CHOL 96 05/01/2021  ? TRIG 53.0 05/01/2021  ? HDL 40.20 05/01/2021  ? LDLCALC 45 05/01/2021  ? ALT 22 05/01/2021  ? AST 27 05/01/2021  ? NA 142 05/01/2021  ? K 4.3 05/01/2021  ? CL 105 05/01/2021  ? CREATININE 0.83 05/01/2021  ? BUN 31 (H) 05/01/2021  ? CO2 29 05/01/2021  ? TSH 2.92 08/29/2020  ? INR 1.1 10/21/2012  ? HGBA1C 5.9 05/01/2021  ? MICROALBUR 1.1 10/19/2016  ? ? ?MM 3D SCREEN BREAST BILATERAL ? ?Result Date: 12/19/2020 ?CLINICAL DATA:  Screening. EXAM: DIGITAL SCREENING BILATERAL MAMMOGRAM WITH TOMOSYNTHESIS AND CAD TECHNIQUE: Bilateral screening digital craniocaudal and mediolateral oblique mammograms were obtained. Bilateral screening digital breast tomosynthesis was performed. The images were evaluated with computer-aided detection.  COMPARISON:  Previous exam(s). ACR Breast Density Category c: The breast tissue is heterogeneously dense, which may obscure small masses. FINDINGS: There are no findings suspicious for malignancy. IMPRESSION: No mam

## 2021-05-07 ENCOUNTER — Encounter: Payer: Self-pay | Admitting: Internal Medicine

## 2021-05-07 NOTE — Assessment & Plan Note (Signed)
Evaluated 08/05/20 - stable.  Recommended f/u in 12 months.  ?

## 2021-05-07 NOTE — Assessment & Plan Note (Signed)
S/p amputation - toe.  Her toe is pushing on her adjacent toe.  Has seen podiatry.  Discussed f/u. She desires to be referred back to podiatry to discuss treatment options.   ?

## 2021-05-07 NOTE — Assessment & Plan Note (Signed)
S/p chemo, XRT and colectomy.  Up to date with colonoscopy - ok.  Followed by Dr Grayland Ormond.  Ostomy working well.  Just evaluated 07/2020.  Stable.  Recommended f/u in one year.  ?

## 2021-05-07 NOTE — Assessment & Plan Note (Signed)
On lovastatin.  Low cholesterol diet and exercise.  Follow lipid panel and liver function tests.   ?Lab Results  ?Component Value Date  ? CHOL 96 05/01/2021  ? HDL 40.20 05/01/2021  ? LDLCALC 45 05/01/2021  ? TRIG 53.0 05/01/2021  ? CHOLHDL 2 05/01/2021  ? ?

## 2021-05-07 NOTE — Assessment & Plan Note (Signed)
Low carb diet and exercise.  Follow met b and a1c.   ?Lab Results  ?Component Value Date  ? HGBA1C 5.9 05/01/2021  ? ?

## 2021-05-07 NOTE — Assessment & Plan Note (Signed)
Followed by [facility].  Stable.  Evaluated 08/05/20 - stable.  Recommended f/u in one year.  ?

## 2021-05-07 NOTE — Assessment & Plan Note (Signed)
Ostomy working well.  Follow.  

## 2021-06-28 ENCOUNTER — Other Ambulatory Visit: Payer: Self-pay | Admitting: Internal Medicine

## 2021-07-06 DIAGNOSIS — D2261 Melanocytic nevi of right upper limb, including shoulder: Secondary | ICD-10-CM | POA: Diagnosis not present

## 2021-07-06 DIAGNOSIS — D2262 Melanocytic nevi of left upper limb, including shoulder: Secondary | ICD-10-CM | POA: Diagnosis not present

## 2021-07-06 DIAGNOSIS — D692 Other nonthrombocytopenic purpura: Secondary | ICD-10-CM | POA: Diagnosis not present

## 2021-07-06 DIAGNOSIS — D225 Melanocytic nevi of trunk: Secondary | ICD-10-CM | POA: Diagnosis not present

## 2021-08-09 NOTE — Progress Notes (Signed)
MRN : [id number]  Christina Mathis is a 86 y.o. (Jul 24, 1935) female who presents with chief complaint of check circulation.  History of Present Illness:   The patient is seen for follow up evaluation of carotid stenosis. The carotid stenosis followed by ultrasound.    The patient denies amaurosis fugax. There is no recent history of TIA symptoms or focal motor deficits. There is no prior documented CVA.   The patient is taking enteric-coated aspirin 81 mg daily.   There is no history of migraine headaches. There is no history of seizures.   The patient has a history of coronary artery disease, no recent episodes of angina or shortness of breath. The patient denies PAD or claudication symptoms. There is a history of hyperlipidemia which is being treated with a statin.     Carotid Duplex done today shows 1-39% bilateral ICA stenosis. The left vertebral has antegrade flow in the right vertebral artery has retrograde flow.    No outpatient medications have been marked as taking for the 08/10/21 encounter (Appointment) with Delana Meyer, Dolores Lory, MD.    Past Medical History:  Diagnosis Date   Colon cancer [facility])    History of shingles    twice   Hypercholesteremia    Hypertension    Left carotid bruit    Nephrolithiasis    Neuropathy    Personal history of chemotherapy 2014   Colon   Personal history of radiation therapy 2014   Colon   Urine incontinence 03/06/2013   h/o    Past Surgical History:  Procedure Laterality Date   APPENDECTOMY  1979   colorectal   CESAREAN SECTION     COLONOSCOPY WITH PROPOFOL N/A 03/21/2015   Procedure: COLONOSCOPY WITH PROPOFOL;  Surgeon: Hulen Luster, MD;  Location: [facility] ENDOSCOPY;  Service: Gastroenterology;  Laterality: N/A;   COLONOSCOPY WITH PROPOFOL N/A 03/21/2020   Procedure: COLONOSCOPY WITH PROPOFOL;  Surgeon: Lesly Rubenstein, MD;  Location: [facility] ENDOSCOPY;  Service: Endoscopy;  Laterality: N/A;   COLOSTOMY     surgery  for colorectal cancer     VENTRAL HERNIA REPAIR Right [phone]    Social History Social History   Tobacco Use   Smoking status: Never   Smokeless tobacco: Never  Vaping Use   Vaping Use: Never used  Substance Use Topics   Alcohol use: No    Alcohol/week: 0.0 standard drinks of alcohol   Drug use: No    Family History Family History  Problem Relation Age of Onset   Hypertension Mother    Hypertension Father    Breast cancer Paternal Aunt     No Known Allergies   REVIEW OF SYSTEMS (Negative unless checked)  Constitutional: '[]'$ Weight loss  '[]'$ Fever  '[]'$ Chills Cardiac: '[]'$ Chest pain   '[]'$ Chest pressure   '[]'$ Palpitations   '[]'$ Shortness of breath when laying flat   '[]'$ Shortness of breath with exertion. Vascular:  '[x]'$ Pain in legs with walking   '[]'$ Pain in legs at rest  '[]'$ History of DVT   '[]'$ Phlebitis   '[]'$ Swelling in legs   '[]'$ Varicose veins   '[]'$ Non-healing ulcers Pulmonary:   '[]'$ Uses home oxygen   '[]'$ Productive cough   '[]'$ Hemoptysis   '[]'$ Wheeze  '[]'$ COPD   '[]'$ Asthma Neurologic:  '[]'$ Dizziness   '[]'$ Seizures   '[]'$ History of stroke   '[]'$ History of TIA  '[]'$ Aphasia   '[]'$ Vissual changes   '[]'$ Weakness or numbness in arm   '[]'$ Weakness or numbness in leg Musculoskeletal:   '[]'$   Joint swelling   '[]'$ Joint pain   '[]'$ Low back pain Hematologic:  '[]'$ Easy bruising  '[]'$ Easy bleeding   '[]'$ Hypercoagulable state   '[]'$ Anemic Gastrointestinal:  '[]'$ Diarrhea   '[]'$ Vomiting  '[]'$ Gastroesophageal reflux/heartburn   '[]'$ Difficulty swallowing. Genitourinary:  '[]'$ Chronic kidney disease   '[]'$ Difficult urination  '[]'$ Frequent urination   '[]'$ Blood in urine Skin:  '[]'$ Rashes   '[]'$ Ulcers  Psychological:  '[]'$ History of anxiety   '[]'$  History of major depression.  Physical Examination  There were no vitals filed for this visit. There is no height or weight on file to calculate BMI. Gen: WD/WN, NAD Head: [place]/AT, No temporalis wasting.  Ear/Nose/Throat: Hearing grossly intact, nares w/o erythema or drainage Eyes: PER, EOMI, sclera nonicteric.  Neck: Supple, no  masses.  No bruit or JVD.  Pulmonary:  Good air movement, no audible wheezing, no use of accessory muscles.  Cardiac: RRR, normal S1, S2, no Murmurs. Vascular:  mild trophic changes, no open wounds Vessel Right Left  Radial Palpable Palpable  carotid Palpable Palpable  Gastrointestinal: soft, non-distended. No guarding/no peritoneal signs.  Musculoskeletal: M/S 5/5 throughout.  No visible deformity.  Neurologic: CN 2-12 intact. Pain and light touch intact in extremities.  Symmetrical.  Speech is fluent. Motor exam as listed above. Psychiatric: Judgment intact, Mood & affect appropriate for pt's clinical situation. Dermatologic: No rashes or ulcers noted.  No changes consistent with cellulitis.   CBC Lab Results  Component Value Date   WBC 6.0 05/01/2021   HGB 12.8 05/01/2021   HCT 37.5 05/01/2021   MCV 104.0 (H) 05/01/2021   PLT 267.0 05/01/2021    BMET    Component Value Date/Time   NA 142 05/01/2021 0759   NA 140 11/30/2013 1121   K 4.3 05/01/2021 0759   K 4.0 11/30/2013 1121   CL 105 05/01/2021 0759   CL 105 11/30/2013 1121   CO2 29 05/01/2021 0759   CO2 31 11/30/2013 1121   GLUCOSE 136 (H) 05/01/2021 0759   GLUCOSE 103 (H) 11/30/2013 1121   BUN 31 (H) 05/01/2021 0759   BUN 14 11/30/2013 1121   CREATININE 0.83 05/01/2021 0759   CREATININE 0.83 11/30/2013 1121   CALCIUM 9.5 05/01/2021 0759   CALCIUM 8.2 (L) 11/30/2013 1121   GFRNONAA >60 08/16/2020 1043   GFRNONAA >60 11/30/2013 1121   GFRNONAA >60 08/31/2013 0917   GFRAA >60 08/17/2019 1024   GFRAA >60 11/30/2013 1121   GFRAA >60 08/31/2013 0917   CrCl cannot be calculated (Patient's most recent lab result is older than the maximum 21 days allowed.).  COAG Lab Results  Component Value Date   INR 1.1 10/21/2012    Radiology No results found.   Assessment/Plan 1. Bilateral carotid artery stenosis Recommend:   Given the patient's asymptomatic subcritical stenosis no further invasive testing or surgery  at this time.   Duplex ultrasound shows 1-39% stenosis bilaterally.   Continue antiplatelet therapy as prescribed Continue management of CAD, HTN and Hyperlipidemia Healthy heart diet,  encouraged exercise at least 4 times per week Follow up in 12 months with duplex ultrasound and physical exam   - VAS US CAROTID; Future  2. Subclavian arterial stenosis (HCC) She remains asymptomatic.  We will continue to monitor with annual carotid studies.  3. Gastroesophageal reflux disease, unspecified whether esophagitis present Continue PPI as already ordered, this medication has been reviewed and there are no changes at this time.  Avoidence of caffeine and alcohol  Moderate elevation of the head of the bed    4. Hypercholesterolemia  Continue statin as ordered and reviewed, no changes at this time     Hortencia Pilar, MD  08/09/2021 5:24 PM

## 2021-08-10 ENCOUNTER — Encounter (INDEPENDENT_AMBULATORY_CARE_PROVIDER_SITE_OTHER): Payer: Self-pay | Admitting: Vascular Surgery

## 2021-08-10 ENCOUNTER — Ambulatory Visit (INDEPENDENT_AMBULATORY_CARE_PROVIDER_SITE_OTHER): Payer: Medicare Other

## 2021-08-10 ENCOUNTER — Ambulatory Visit (INDEPENDENT_AMBULATORY_CARE_PROVIDER_SITE_OTHER): Payer: Medicare Other | Admitting: Vascular Surgery

## 2021-08-10 ENCOUNTER — Other Ambulatory Visit (INDEPENDENT_AMBULATORY_CARE_PROVIDER_SITE_OTHER): Payer: Self-pay | Admitting: Vascular Surgery

## 2021-08-10 VITALS — BP 134/66 | HR 71 | Resp 17 | Ht 63.0 in | Wt 125.0 lb

## 2021-08-10 DIAGNOSIS — E7800 Pure hypercholesterolemia, unspecified: Secondary | ICD-10-CM

## 2021-08-10 DIAGNOSIS — I771 Stricture of artery: Secondary | ICD-10-CM | POA: Diagnosis not present

## 2021-08-10 DIAGNOSIS — I679 Cerebrovascular disease, unspecified: Secondary | ICD-10-CM

## 2021-08-10 DIAGNOSIS — K219 Gastro-esophageal reflux disease without esophagitis: Secondary | ICD-10-CM

## 2021-08-10 DIAGNOSIS — I6523 Occlusion and stenosis of bilateral carotid arteries: Secondary | ICD-10-CM

## 2021-08-11 ENCOUNTER — Other Ambulatory Visit: Payer: Self-pay | Admitting: Internal Medicine

## 2021-08-11 ENCOUNTER — Encounter (INDEPENDENT_AMBULATORY_CARE_PROVIDER_SITE_OTHER): Payer: Self-pay | Admitting: Vascular Surgery

## 2021-08-11 ENCOUNTER — Other Ambulatory Visit: Payer: Self-pay

## 2021-08-11 DIAGNOSIS — Z933 Colostomy status: Secondary | ICD-10-CM | POA: Diagnosis not present

## 2021-08-11 DIAGNOSIS — Z85038 Personal history of other malignant neoplasm of large intestine: Secondary | ICD-10-CM | POA: Diagnosis not present

## 2021-08-11 DIAGNOSIS — M8580 Other specified disorders of bone density and structure, unspecified site: Secondary | ICD-10-CM

## 2021-08-11 MED ORDER — ALENDRONATE SODIUM 70 MG PO TABS: ORAL_TABLET | ORAL | 11 refills | Status: DC

## 2021-08-16 ENCOUNTER — Inpatient Hospital Stay: Payer: Medicare Other | Attending: Oncology

## 2021-08-16 DIAGNOSIS — Z923 Personal history of irradiation: Secondary | ICD-10-CM | POA: Diagnosis not present

## 2021-08-16 DIAGNOSIS — Z9221 Personal history of antineoplastic chemotherapy: Secondary | ICD-10-CM | POA: Diagnosis not present

## 2021-08-16 DIAGNOSIS — E7800 Pure hypercholesterolemia, unspecified: Secondary | ICD-10-CM | POA: Diagnosis not present

## 2021-08-16 DIAGNOSIS — Z7982 Long term (current) use of aspirin: Secondary | ICD-10-CM | POA: Insufficient documentation

## 2021-08-16 DIAGNOSIS — I10 Essential (primary) hypertension: Secondary | ICD-10-CM | POA: Diagnosis not present

## 2021-08-16 DIAGNOSIS — C20 Malignant neoplasm of rectum: Secondary | ICD-10-CM | POA: Diagnosis not present

## 2021-08-16 DIAGNOSIS — Z79899 Other long term (current) drug therapy: Secondary | ICD-10-CM | POA: Diagnosis not present

## 2021-08-16 DIAGNOSIS — Z803 Family history of malignant neoplasm of breast: Secondary | ICD-10-CM | POA: Insufficient documentation

## 2021-08-16 LAB — COMPREHENSIVE METABOLIC PANEL
ALT: 28 U/L (ref 0–44)
AST: 30 U/L (ref 15–41)
Albumin: 3.9 g/dL (ref 3.5–5.0)
Alkaline Phosphatase: 43 U/L (ref 38–126)
Anion gap: 5 (ref 5–15)
BUN: 27 mg/dL — ABNORMAL HIGH (ref 8–23)
CO2: 30 mmol/L (ref 22–32)
Calcium: 8.8 mg/dL — ABNORMAL LOW (ref 8.9–10.3)
Chloride: 105 mmol/L (ref 98–111)
Creatinine, Ser: 0.92 mg/dL (ref 0.44–1.00)
GFR, Estimated: >60 mL/min (ref >60)
Glucose, Bld: 124 mg/dL — ABNORMAL HIGH (ref 70–99)
Potassium: 4 mmol/L (ref 3.5–5.1)
Sodium: 140 mmol/L (ref 135–145)
Total Bilirubin: 0.9 mg/dL (ref 0.3–1.2)
Total Protein: 6.8 g/dL (ref 6.5–8.1)

## 2021-08-16 LAB — CBC WITH DIFFERENTIAL/PLATELET
Abs Immature Granulocytes: 0.04 10*3/uL (ref 0.00–0.07)
Basophils Absolute: 0.1 10*3/uL (ref 0.0–0.1)
Basophils Relative: 1 %
Eosinophils Absolute: 0 10*3/uL (ref 0.0–0.5)
Eosinophils Relative: 0 %
HCT: 37.5 % (ref 36.0–46.0)
Hemoglobin: 12.6 g/dL (ref 12.0–15.0)
Immature Granulocytes: 1 %
Lymphocytes Relative: 12 %
Lymphs Abs: 0.9 10*3/uL (ref 0.7–4.0)
MCH: 35 pg — ABNORMAL HIGH (ref 26.0–34.0)
MCHC: 33.6 g/dL (ref 30.0–36.0)
MCV: 104.2 fL — ABNORMAL HIGH (ref 80.0–100.0)
Monocytes Absolute: 0.6 10*3/uL (ref 0.1–1.0)
Monocytes Relative: 8 %
Neutro Abs: 5.9 10*3/uL (ref 1.7–7.7)
Neutrophils Relative %: 78 %
Platelets: 257 10*3/uL (ref 150–400)
RBC: 3.6 MIL/uL — ABNORMAL LOW (ref 3.87–5.11)
RDW: 13.2 % (ref 11.5–15.5)
WBC: 7.5 10*3/uL (ref 4.0–10.5)
nRBC: 0 % (ref 0.0–0.2)

## 2021-08-17 LAB — CEA: CEA: 2.4 ng/mL (ref 0.0–4.7)

## 2021-08-18 ENCOUNTER — Encounter: Payer: Self-pay | Admitting: Medical Oncology

## 2021-08-18 ENCOUNTER — Inpatient Hospital Stay: Payer: Medicare Other | Admitting: Medical Oncology

## 2021-08-18 VITALS — BP 132/60 | HR 70 | Temp 97.3°F | Resp 16 | Wt 127.0 lb

## 2021-08-18 DIAGNOSIS — C20 Malignant neoplasm of rectum: Secondary | ICD-10-CM

## 2021-08-18 DIAGNOSIS — Z7982 Long term (current) use of aspirin: Secondary | ICD-10-CM | POA: Diagnosis not present

## 2021-08-18 DIAGNOSIS — Z9221 Personal history of antineoplastic chemotherapy: Secondary | ICD-10-CM | POA: Diagnosis not present

## 2021-08-18 DIAGNOSIS — Z79899 Other long term (current) drug therapy: Secondary | ICD-10-CM | POA: Diagnosis not present

## 2021-08-18 DIAGNOSIS — Z803 Family history of malignant neoplasm of breast: Secondary | ICD-10-CM | POA: Diagnosis not present

## 2021-08-18 DIAGNOSIS — E7800 Pure hypercholesterolemia, unspecified: Secondary | ICD-10-CM | POA: Diagnosis not present

## 2021-08-18 DIAGNOSIS — Z923 Personal history of irradiation: Secondary | ICD-10-CM | POA: Diagnosis not present

## 2021-08-18 DIAGNOSIS — I10 Essential (primary) hypertension: Secondary | ICD-10-CM | POA: Diagnosis not present

## 2021-08-18 NOTE — Progress Notes (Signed)
[place]  [phone]  [phone]     Christina Mathis DOB: 12-19-1935  MR#: [id number]  ATF#:[id number]  Patient Care Team: Einar Pheasant, MD as PCP - General (Internal Medicine) Paulla Dolly Tamala Fothergill, DPM as Consulting Physician (Podiatry) Grayland Ormond Kathlene November, MD as Consulting Physician (Oncology) Efrain Sella, MD as Consulting Physician (Gastroenterology)  CHIEF COMPLAINT: Adenocarcinoma of the rectum.  INTERVAL HISTORY: Christina Mathis returns to clinic today for repeat laboratory work and routine yearly evaluation.  She continues to feel well and remains asymptomatic. She wishes to continue yearly blood monitoring and visits. She continues to remain active.  She continues to feel well and remains asymptomatic.  She reports no changes in bowel movements or problems with her colostomy.  She has no neurologic complaints.  She denies any recent fevers or illnesses.  She has no chest pain, shortness of breath, cough, or hemoptysis.  She has a good appetite and denies weight loss.  She has no nausea, vomiting, constipation, or diarrhea.  She reports no melena or hematochezia.  She has no urinary complaints.  Patient feels at her baseline offers no specific complaints today.  REVIEW OF SYSTEMS:   Review of Systems  Constitutional: Negative.  Negative for fever, malaise/fatigue and weight loss.  Respiratory: Negative.  Negative for cough and shortness of breath.   Cardiovascular: Negative.  Negative for chest pain and leg swelling.  Gastrointestinal: Negative.  Negative for abdominal pain, blood in stool and melena.  Genitourinary: Negative.  Negative for dysuria.  Musculoskeletal: Negative.  Negative for back pain.  Skin: Negative.  Negative for rash.  Neurological: Negative.  Negative for tingling, sensory change, focal weakness, weakness and headaches.  Psychiatric/Behavioral: Negative.  The patient is not nervous/anxious.     As per HPI. Otherwise, a  complete review of systems is negative.  ONCOLOGY HISTORY: Oncology History Overview Note  5. 86 year old female with stage III (T3, N1, M0) adenocarcinoma of the rectum for  preop chemoradiation 2. Starting 5-FU by continuous infusion and radiation therapy November 24, 2012 3. Patient is finishing up radiation and chemotherapy on December 15 of 2014 4. Status postsurgery and colostomyFebruary of 2015, pT0 pNO tumor stage 0 5. Post operative adjuvant with FOLFOX march 2015 6.patient finished total 6 cycles of chemotherapy with FOLFOX on August 31, 2013. 7.  January 2 016 [name] had hernia repair     PAST MEDICAL HISTORY: Past Medical History:  Diagnosis Date   Colon cancer [facility])    History of shingles    twice   Hypercholesteremia    Hypertension    Left carotid bruit    Nephrolithiasis    Neuropathy    Personal history of chemotherapy 2014   Colon   Personal history of radiation therapy 2014   Colon   Urine incontinence 03/06/2013   h/o    PAST SURGICAL HISTORY: Past Surgical History:  Procedure Laterality Date   APPENDECTOMY  1979   colorectal   CESAREAN SECTION     COLONOSCOPY WITH PROPOFOL N/A 03/21/2015   Procedure: COLONOSCOPY WITH PROPOFOL;  Surgeon: Hulen Luster, MD;  Location[facility] ENDOSCOPY;  Service: Gastroenterology;  Laterality: N/A;   COLONOSCOPY WITH PROPOFOL N/A 03/21/2020   Procedure: COLONOSCOPY WITH PROPOFOL;  Surgeon: Lesly Rubenstein, MD;  Location: [facility] ENDOSCOPY;  Service: Endoscopy;  Laterality: N/A;   COLOSTOMY     surgery for colorectal cancer     VENTRAL HERNIA REPAIR Right [phone]    FAMILY HISTORY Family History  Problem  Relation Age of Onset   Hypertension Mother    Hypertension Father    Breast cancer Paternal Aunt     GYNECOLOGIC HISTORY:  No LMP recorded. Patient is postmenopausal.     ADVANCED DIRECTIVES:    HEALTH MAINTENANCE: Social History   Tobacco Use   Smoking status: Never   Smokeless tobacco: Never  Vaping Use    Vaping Use: Never used  Substance Use Topics   Alcohol use: No    Alcohol/week: 0.0 standard drinks of alcohol   Drug use: No     No Known Allergies  Current Outpatient Medications  Medication Sig Dispense Refill   alendronate (FOSAMAX) 70 MG tablet Take with a full glass of water on an empty stomach. 4 tablet 11   aspirin EC 81 MG tablet Take 81 mg by mouth daily.     Biotin 1000 MCG tablet Take 1,000 mcg by mouth 2 (two) times daily.      calcium citrate-vitamin D (CITRACAL+D) 315-200 MG-UNIT per tablet Take 1 tablet by mouth 2 (two) times daily.     Cholecalciferol (VITAMIN D3) 5000 units CAPS Take 1 capsule by mouth daily.     glucosamine-chondroitin 500-400 MG tablet Take 1 tablet by mouth 2 (two) times daily.     losartan-hydrochlorothiazide (HYZAAR) 50-12.5 MG tablet TAKE 1 TABLET BY MOUTH DAILY 90 tablet 3   lovastatin (MEVACOR) 10 MG tablet TAKE 1 TABLET(10 MG) BY MOUTH AT BEDTIME 90 tablet 1   Multiple Vitamin (MULTI-VITAMINS) TABS Take 1 tablet by mouth daily.      loratadine (CLARITIN) 10 MG tablet Take 10 mg by mouth daily as needed. (Patient not taking: Reported on 08/18/2021)     No current facility-administered medications for this visit.   Facility-Administered Medications Ordered in Other Visits  Medication Dose Route Frequency Provider Last Rate Last Admin   sodium chloride flush (NS) 0.9 % injection 10 mL  10 mL Intravenous PRN Choksi, Delorise Shiner, MD   10 mL at 05/31/15 1040   sodium chloride flush (NS) 0.9 % injection 10 mL  10 mL Intravenous PRN Choksi, Delorise Shiner, MD   10 mL at 07/12/15 1018    OBJECTIVE: BP 132/60 (BP Location: Left Arm, Patient Position: Sitting)   Pulse 70   Temp (!) 97.3 F (36.3 C) (Tympanic)   Resp 16   Wt 127 lb (57.6 kg)   SpO2 100%   BMI 22.50 kg/m    Body mass index is 22.5 kg/m.    ECOG FS:0 - Asymptomatic  General: Well-developed, well-nourished, no acute distress. Eyes: Pink conjunctiva, anicteric sclera. HEENT: Normocephalic,  moist mucous membranes. Lungs: No audible wheezing or coughing. Heart: Regular rate and rhythm. Abdomen: Soft, nontender, no obvious distention.  Colostomy noted. Musculoskeletal: No edema, cyanosis, or clubbing. Neuro: Alert, answering all questions appropriately. Cranial nerves grossly intact. Skin: No rashes or petechiae noted. Psych: Normal affect.   LAB RESULTS:  Appointment on 08/16/2021  Component Date Value Ref Range Status   CEA 08/16/2021 2.4  0.0 - 4.7 ng/mL Final   Comment: (NOTE)                             Nonsmokers          <3.9                             Smokers             <  5.6 [employer] Electrochemiluminescence Immunoassay (ECLIA) Values obtained with different assay methods or kits cannot be used interchangeably.  Results cannot be interpreted as absolute evidence of the presence or absence of malignant disease. Performed At: [facility] [place], [place] [id number] Rush Farmer MD [id number]    Sodium 08/16/2021 140  135 - 145 mmol/L Final   Potassium 08/16/2021 4.0  3.5 - 5.1 mmol/L Final   Chloride 08/16/2021 105  98 - 111 mmol/L Final   CO2 08/16/2021 30  22 - 32 mmol/L Final   Glucose, Bld 08/16/2021 124 (H)  70 - 99 mg/dL Final   Glucose reference range applies only to samples taken after fasting for at least 8 hours.   BUN 08/16/2021 27 (H)  8 - 23 mg/dL Final   Creatinine, Ser 08/16/2021 0.92  0.44 - 1.00 mg/dL Final   Calcium 08/16/2021 8.8 (L)  8.9 - 10.3 mg/dL Final   Total Protein 08/16/2021 6.8  6.5 - 8.1 g/dL Final   Albumin 08/16/2021 3.9  3.5 - 5.0 g/dL Final   AST 08/16/2021 30  15 - 41 U/L Final   ALT 08/16/2021 28  0 - 44 U/L Final   Alkaline Phosphatase 08/16/2021 43  38 - 126 U/L Final   Total Bilirubin 08/16/2021 0.9  0.3 - 1.2 mg/dL Final   GFR, Estimated 08/16/2021 >60  >60 mL/min Final   Comment: (NOTE) Calculated using the CKD-EPI Creatinine Equation (2021)    Anion gap 08/16/2021 5  5 -  15 Final   Performed at [facility], [address] [place], [place] [address]   WBC 08/16/2021 7.5  4.0 - 10.5 K/uL Final   RBC 08/16/2021 3.60 (L)  3.87 - 5.11 MIL/uL Final   Hemoglobin 08/16/2021 12.6  12.0 - 15.0 g/dL Final   HCT 08/16/2021 37.5  36.0 - 46.0 % Final   MCV 08/16/2021 104.2 (H)  80.0 - 100.0 fL Final   MCH 08/16/2021 35.0 (H)  26.0 - 34.0 pg Final   MCHC 08/16/2021 33.6  30.0 - 36.0 g/dL Final   RDW 08/16/2021 13.2  11.5 - 15.5 % Final   Platelets 08/16/2021 257  150 - 400 K/uL Final   nRBC 08/16/2021 0.0  0.0 - 0.2 % Final   Neutrophils Relative % 08/16/2021 78  % Final   Neutro Abs 08/16/2021 5.9  1.7 - 7.7 K/uL Final   Lymphocytes Relative 08/16/2021 12  % Final   Lymphs Abs 08/16/2021 0.9  0.7 - 4.0 K/uL Final   Monocytes Relative 08/16/2021 8  % Final   Monocytes Absolute 08/16/2021 0.6  0.1 - 1.0 K/uL Final   Eosinophils Relative 08/16/2021 0  % Final   Eosinophils Absolute 08/16/2021 0.0  0.0 - 0.5 K/uL Final   Basophils Relative 08/16/2021 1  % Final   Basophils Absolute 08/16/2021 0.1  0.0 - 0.1 K/uL Final   Immature Granulocytes 08/16/2021 1  % Final   Abs Immature Granulocytes 08/16/2021 0.04  0.00 - 0.07 K/uL Final   Performed at [facility], [address], [place], [place] [address]   Lab Results  Component Value Date   CEA 2.3 01/11/2016    STUDIES: No results found.  ASSESSMENT:  Adenocarcinoma of the rectum, stage III, T3 N1 M0.  PLAN:    1. Rectal cancer: No evidence of disease.  Patient completed 6 cycles of adjuvant FOLFOX in August 2015.  Patient's most recent colonoscopy in February 2022 was reported as normal with  no evidence of progression or recurrent disease.  Recommendation was repeat in 5 years.  TODAY -  Her CEA has reduced down to 2.4 from 2.7. Recent CT scan showed no sign of disease recurrence. Follow up in 1 year or sooner as needed.  2.  Elevated MCV: Chronic and stable. No intervention is needed.  Patient  expressed understanding and was in agreement with this plan. She also understands that She can call clinic at any time with any questions, concerns, or complaints.    Hughie Closs, PA-C 08/18/21 4:51 PM

## 2021-08-18 NOTE — Progress Notes (Signed)
Pt in for follow up, denies any concerns today. 

## 2021-08-30 ENCOUNTER — Other Ambulatory Visit (INDEPENDENT_AMBULATORY_CARE_PROVIDER_SITE_OTHER): Payer: Medicare Other

## 2021-08-30 DIAGNOSIS — E7800 Pure hypercholesterolemia, unspecified: Secondary | ICD-10-CM | POA: Diagnosis not present

## 2021-08-30 DIAGNOSIS — D7589 Other specified diseases of blood and blood-forming organs: Secondary | ICD-10-CM

## 2021-08-30 DIAGNOSIS — R739 Hyperglycemia, unspecified: Secondary | ICD-10-CM | POA: Diagnosis not present

## 2021-08-30 LAB — BASIC METABOLIC PANEL
BUN: 31 mg/dL — ABNORMAL HIGH (ref 6–23)
CO2: 32 mEq/L (ref 19–32)
Calcium: 9.5 mg/dL (ref 8.4–10.5)
Chloride: 103 mEq/L (ref 96–112)
Creatinine, Ser: 1.01 mg/dL (ref 0.40–1.20)
GFR: 51.32 mL/min — ABNORMAL LOW (ref >60.00)
Glucose, Bld: 126 mg/dL — ABNORMAL HIGH (ref 70–99)
Potassium: 4.4 mEq/L (ref 3.5–5.1)
Sodium: 141 mEq/L (ref 135–145)

## 2021-08-30 LAB — LIPID PANEL
Cholesterol: 96 mg/dL (ref 0–200)
HDL: 37.1 mg/dL — ABNORMAL LOW (ref >39.00)
LDL Cholesterol: 48 mg/dL (ref 0–99)
NonHDL: 58.8
Total CHOL/HDL Ratio: 3
Triglycerides: 56 mg/dL (ref 0.0–149.0)
VLDL: 11.2 mg/dL (ref 0.0–40.0)

## 2021-08-30 LAB — HEPATIC FUNCTION PANEL
ALT: 36 U/L — ABNORMAL HIGH (ref 0–35)
AST: 33 U/L (ref 0–37)
Albumin: 4 g/dL (ref 3.5–5.2)
Alkaline Phosphatase: 42 U/L (ref 39–117)
Bilirubin, Direct: 0.2 mg/dL (ref 0.0–0.3)
Total Bilirubin: 0.7 mg/dL (ref 0.2–1.2)
Total Protein: 6.7 g/dL (ref 6.0–8.3)

## 2021-08-30 LAB — VITAMIN B12: Vitamin B-12: 472 pg/mL (ref 211–911)

## 2021-08-30 LAB — HEMOGLOBIN A1C: Hgb A1c MFr Bld: 5.9 % (ref 4.6–6.5)

## 2021-09-04 ENCOUNTER — Ambulatory Visit (INDEPENDENT_AMBULATORY_CARE_PROVIDER_SITE_OTHER): Payer: Medicare Other | Admitting: Internal Medicine

## 2021-09-04 ENCOUNTER — Encounter: Payer: Self-pay | Admitting: Internal Medicine

## 2021-09-04 VITALS — BP 110/80 | HR 64 | Temp 98.1°F | Resp 16 | Ht 63.0 in | Wt 125.8 lb

## 2021-09-04 DIAGNOSIS — E2839 Other primary ovarian failure: Secondary | ICD-10-CM

## 2021-09-04 DIAGNOSIS — I10 Essential (primary) hypertension: Secondary | ICD-10-CM

## 2021-09-04 DIAGNOSIS — Z1231 Encounter for screening mammogram for malignant neoplasm of breast: Secondary | ICD-10-CM

## 2021-09-04 DIAGNOSIS — Z933 Colostomy status: Secondary | ICD-10-CM

## 2021-09-04 DIAGNOSIS — Z85048 Personal history of other malignant neoplasm of rectum, rectosigmoid junction, and anus: Secondary | ICD-10-CM

## 2021-09-04 DIAGNOSIS — R739 Hyperglycemia, unspecified: Secondary | ICD-10-CM

## 2021-09-04 DIAGNOSIS — I771 Stricture of artery: Secondary | ICD-10-CM

## 2021-09-04 DIAGNOSIS — I6523 Occlusion and stenosis of bilateral carotid arteries: Secondary | ICD-10-CM

## 2021-09-04 DIAGNOSIS — D692 Other nonthrombocytopenic purpura: Secondary | ICD-10-CM

## 2021-09-04 DIAGNOSIS — S98132A Complete traumatic amputation of one left lesser toe, initial encounter: Secondary | ICD-10-CM

## 2021-09-04 DIAGNOSIS — E7800 Pure hypercholesterolemia, unspecified: Secondary | ICD-10-CM

## 2021-09-04 DIAGNOSIS — M8580 Other specified disorders of bone density and structure, unspecified site: Secondary | ICD-10-CM

## 2021-09-04 NOTE — Progress Notes (Signed)
[name], female   DOB: [birth year], 86 y.o.   MRN: [id number]   Subjective:    [name], female    DOB: Mar 04, 1935, 86 y.o.   MRN: [id number]   Patient here for a scheduled follow up. Marland Kitchen   HPI Here to follow up regarding hypercholesterolemia and hypertension.  States she is doing relatively well.  Tries to stay active.  No chest pain or sob reported.  No abdominal pain.  Ostomy working well.  Does report increased nocturia.  Denies significant increased urination during the day.  Does drink in evening.  Discussed possible sleep apnea.  Still having issues with her toes and has noticed some left ankle discomfort.  Would like a second opinion about her toe and evaluation of her ankle.  Bruise hand.  Discussed skin thinning.  No unusual bleeding.  No increased pain.     Past Medical History:  Diagnosis Date   Colon cancer [facility])    History of shingles    twice   Hypercholesteremia    Hypertension    Left carotid bruit    Nephrolithiasis    Neuropathy    Personal history of chemotherapy 2014   Colon   Personal history of radiation therapy 2014   Colon   Urine incontinence 03/06/2013   h/o   Past Surgical History:  Procedure Laterality Date   APPENDECTOMY  1979   colorectal   CESAREAN SECTION     COLONOSCOPY WITH PROPOFOL N/A 03/21/2015   Procedure: COLONOSCOPY WITH PROPOFOL;  Surgeon: Hulen Luster, MD;  Location: [facility] ENDOSCOPY;  Service: Gastroenterology;  Laterality: N/A;   COLONOSCOPY WITH PROPOFOL N/A 03/21/2020   Procedure: COLONOSCOPY WITH PROPOFOL;  Surgeon: Lesly Rubenstein, MD;  Location: [facility] ENDOSCOPY;  Service: Endoscopy;  Laterality: N/A;   COLOSTOMY     surgery for colorectal cancer     VENTRAL HERNIA REPAIR Right [phone]   Family History  Problem Relation Age of Onset   Hypertension Mother    Hypertension Father    Breast cancer Paternal Aunt    Social History   Socioeconomic History   Marital status: Widowed    Spouse  name: Not on file   Number of children: Not on file   Years of education: Not on file   Highest education level: Not on file  Occupational History   Not on file  Tobacco Use   Smoking status: Never   Smokeless tobacco: Never  Vaping Use   Vaping Use: Never used  Substance and Sexual Activity   Alcohol use: No    Alcohol/week: 0.0 standard drinks of alcohol   Drug use: No   Sexual activity: Not Currently  Other Topics Concern   Not on file  Social History Narrative   Not on file   Social Determinants of Health   Financial Resource Strain: Low Risk  (03/21/2021)   Overall Financial Resource Strain (CARDIA)    Difficulty of Paying Living Expenses: Not hard at all  Food Insecurity: No Food Insecurity (03/21/2021)   Hunger Vital Sign    Worried About Running Out of Food in the Last Year: Never true    Wise in the Last Year: Never true  Transportation Needs: No Transportation Needs (03/21/2021)   PRAPARE - Hydrologist (Medical): No    Lack of Transportation (Non-Medical): No  Physical Activity: Insufficiently Active (03/21/2021)   Exercise Vital Sign    Days of Exercise per Week:  4 days    Minutes of Exercise per Session: 20 min  Stress: No Stress Concern Present (03/21/2021)   [place]    Feeling of Stress : Not at all  Social Connections: Moderately Integrated (03/21/2021)   Social Connection and Isolation Panel [NHANES]    Frequency of Communication with Friends and Family: More than three times a week    Frequency of Social Gatherings with Friends and Family: Not on file    Attends Religious Services: More than 4 times per year    Active Member of [employer] or Organizations: Yes    Attends Archivist Meetings: More than 4 times per year    Marital Status: Widowed     Review of Systems  Constitutional:  Negative for appetite change and unexpected weight change.   HENT:  Negative for congestion and sinus pressure.   Respiratory:  Negative for cough, chest tightness and shortness of breath.   Cardiovascular:  Negative for chest pain, palpitations and leg swelling.  Gastrointestinal:  Negative for abdominal pain, diarrhea, nausea and vomiting.  Genitourinary:  Negative for difficulty urinating and dysuria.       Nocturia.   Musculoskeletal:  Negative for joint swelling and myalgias.  Skin:  Negative for color change and rash.       Bruise as outlined.   Neurological:  Negative for dizziness and headaches.  Psychiatric/Behavioral:  Negative for agitation and dysphoric mood.        Objective:     BP 110/80 (BP Location: Left Arm, Patient Position: Sitting, Cuff Size: Small)   Pulse 64   Temp 98.1 F (36.7 C) (Temporal)   Resp 16   Ht '5\' 3"'  (1.6 m)   Wt 125 lb 12.8 oz (57.1 kg)   SpO2 97%   BMI 22.28 kg/m  Wt Readings from Last 3 Encounters:  09/04/21 125 lb 12.8 oz (57.1 kg)  08/18/21 127 lb (57.6 kg)  08/10/21 125 lb (56.7 kg)    Physical Exam Vitals reviewed.  Constitutional:      General: She is not in acute distress.    Appearance: Normal appearance.  HENT:     Head: Normocephalic and atraumatic.     Right Ear: External ear normal.     Left Ear: External ear normal.     Mouth/Throat:     Pharynx: Oropharynx is clear. No posterior oropharyngeal erythema.  Eyes:     General: No scleral icterus.       Right eye: No discharge.        Left eye: No discharge.     Conjunctiva/sclera: Conjunctivae normal.  Neck:     Thyroid: No thyromegaly.  Cardiovascular:     Rate and Rhythm: Normal rate and regular rhythm.  Pulmonary:     Effort: No respiratory distress.     Breath sounds: Normal breath sounds. No wheezing.  Abdominal:     General: Bowel sounds are normal.     Palpations: Abdomen is soft.     Tenderness: There is no abdominal tenderness.     Comments: No tenderness - around ostomy.    Musculoskeletal:        General:  No swelling or tenderness.     Cervical back: Neck supple. No tenderness.  Lymphadenopathy:     Cervical: No cervical adenopathy.  Skin:    Findings: No erythema or rash.  Neurological:     Mental Status: She is alert.  Psychiatric:  Mood and Affect: Mood normal.        Behavior: Behavior normal.      Outpatient Encounter Medications as of 09/04/2021  Medication Sig   alendronate (FOSAMAX) 70 MG tablet Take with a full glass of water on an empty stomach.   aspirin EC 81 MG tablet Take 81 mg by mouth daily.   Biotin 1000 MCG tablet Take 1,000 mcg by mouth 2 (two) times daily.    calcium citrate-vitamin D (CITRACAL+D) 315-200 MG-UNIT per tablet Take 1 tablet by mouth 2 (two) times daily.   Cholecalciferol (VITAMIN D3) 5000 units CAPS Take 1 capsule by mouth daily.   glucosamine-chondroitin 500-400 MG tablet Take 1 tablet by mouth 2 (two) times daily.   loratadine (CLARITIN) 10 MG tablet Take 10 mg by mouth daily as needed.   losartan-hydrochlorothiazide (HYZAAR) 50-12.5 MG tablet TAKE 1 TABLET BY MOUTH DAILY   lovastatin (MEVACOR) 10 MG tablet TAKE 1 TABLET(10 MG) BY MOUTH AT BEDTIME   Multiple Vitamin (MULTI-VITAMINS) TABS Take 1 tablet by mouth daily.    Facility-Administered Encounter Medications as of 09/04/2021  Medication   sodium chloride flush (NS) 0.9 % injection 10 mL   sodium chloride flush (NS) 0.9 % injection 10 mL     Lab Results  Component Value Date   WBC 7.5 08/16/2021   HGB 12.6 08/16/2021   HCT 37.5 08/16/2021   PLT 257 08/16/2021   GLUCOSE 126 (H) 08/30/2021   CHOL 96 08/30/2021   TRIG 56.0 08/30/2021   HDL 37.10 (L) 08/30/2021   LDLCALC 48 08/30/2021   ALT 36 (H) 08/30/2021   AST 33 08/30/2021   NA 141 08/30/2021   K 4.4 08/30/2021   CL 103 08/30/2021   CREATININE 1.01 08/30/2021   BUN 31 (H) 08/30/2021   CO2 32 08/30/2021   TSH 2.92 08/29/2020   INR 1.1 10/21/2012   HGBA1C 5.9 08/30/2021   MICROALBUR 1.1 10/19/2016    MM 3D SCREEN  BREAST BILATERAL  Result Date: 12/19/2020 CLINICAL DATA:  Screening. EXAM: DIGITAL SCREENING BILATERAL MAMMOGRAM WITH TOMOSYNTHESIS AND CAD TECHNIQUE: Bilateral screening digital craniocaudal and mediolateral oblique mammograms were obtained. Bilateral screening digital breast tomosynthesis was performed. The images were evaluated with computer-aided detection. COMPARISON:  Previous exam(s). ACR Breast Density Category c: The breast tissue is heterogeneously dense, which may obscure small masses. FINDINGS: There are no findings suspicious for malignancy. IMPRESSION: No mammographic evidence of malignancy. A result letter of this screening mammogram will be mailed directly to the patient. RECOMMENDATION: Screening mammogram in one year. (Code:SM-B-01Y) BI-RADS CATEGORY  1: Negative. Electronically Signed   By: Lovey Newcomer M.D.   On: 12/19/2020 12:14      Assessment & Plan:   Problem List Items Addressed This Visit     Amputated toe of left foot ([place])    S/p amputation - toe.  Her toe is pushing on her adjacent toe.  Has seen podiatry.  Discussed f/u. Having some discomfort in her ankle as well.  She desires to be referred back to podiatry to discuss treatment options.      Relevant Orders   Ambulatory referral to Podiatry   Carotid artery disease ([place])    Evaluated 08/10/21 - Duplex ultrasound shows 1-39% stenosis bilaterally. Recommended f/u in 12 months.       Colostomy status ([place])    Ostomy working well.  Follow.       History of rectal cancer    S/p chemo, XRT and colectomy.  No evidence  of disease.  Patient completed 6 cycles of adjuvant FOLFOX in August 2015.  Patient's most recent colonoscopy in February 2022 was reported as normal with no evidence of progression or recurrent disease.  Recommendation was repeat in 5 years.  Last CEA has reduced down to 2.4 from 2.7. Recent CT scan showed no sign of disease recurrence. Follow up in 1 year or sooner as needed.  (last evaluated 07/2021).  Followed by Dr Grayland Ormond.  Ostomy working well.       Hypercholesterolemia    On lovastatin.  Low cholesterol diet and exercise.  Follow lipid panel and liver function tests.   Lab Results  Component Value Date   CHOL 96 08/30/2021   HDL 37.10 (L) 08/30/2021   LDLCALC 48 08/30/2021   TRIG 56.0 08/30/2021   CHOLHDL 3 08/30/2021       Relevant Orders   Hepatic function panel   TSH   Lipid panel   Hyperglycemia    Low carb diet and exercise.  Follow met b and a1c.   Lab Results  Component Value Date   HGBA1C 5.9 08/30/2021       Relevant Orders   Hemoglobin A1c   Hypertension, essential    Continue losartan/hctz.  Blood pressure as outlined.  Follow pressures.  Follow metabolic panel.       Relevant Orders   Basic metabolic panel   Osteopenia    On fosamax.  Started in 2017.   Continue calcium, vitamin  D and weight bearing exercise.  Bone density ordered. Will need to discuss holding treatment.       Senile purpura ([facility])    Bruising on skin.  Discussed thin skin.  No unusual bleeding.  No pain.  Follow.       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  Stable.  Evaluated 08/10/21 - stable.  Recommended f/u in one year.       Other Visit Diagnoses     Estrogen deficiency    -  Primary   Relevant Orders   DG Bone Density   Encounter for screening mammogram for malignant neoplasm of breast       Relevant Orders   MM 3D SCREEN BREAST BILATERAL        Einar Pheasant, MD

## 2021-09-09 ENCOUNTER — Telehealth: Payer: Self-pay | Admitting: Internal Medicine

## 2021-09-09 ENCOUNTER — Encounter: Payer: Self-pay | Admitting: Internal Medicine

## 2021-09-09 DIAGNOSIS — D692 Other nonthrombocytopenic purpura: Secondary | ICD-10-CM | POA: Insufficient documentation

## 2021-09-09 NOTE — Assessment & Plan Note (Signed)
Ostomy working well.  Follow.  

## 2021-09-09 NOTE — Assessment & Plan Note (Signed)
Continue losartan/hctz.  Blood pressure as outlined.  Follow pressures.  Follow metabolic panel.  

## 2021-09-09 NOTE — Assessment & Plan Note (Signed)
S/p chemo, XRT and colectomy.  No evidence of disease.  Patient completed 6 cycles of adjuvant FOLFOX in August 2015.  Patient's most recent colonoscopy in February 2022 was reported as normal with no evidence of progression or recurrent disease.  Recommendation was repeat in 5 years.  Last CEA has reduced down to 2.4 from 2.7. Recent CT scan showed no sign of disease recurrence. Follow up in 1 year or sooner as needed.  (last evaluated 07/2021). Followed by Dr Grayland Ormond.  Ostomy working well.

## 2021-09-09 NOTE — Assessment & Plan Note (Signed)
On lovastatin.  Low cholesterol diet and exercise.  Follow lipid panel and liver function tests.   Lab Results  Component Value Date   CHOL 96 08/30/2021   HDL 37.10 (L) 08/30/2021   LDLCALC 48 08/30/2021   TRIG 56.0 08/30/2021   CHOLHDL 3 08/30/2021

## 2021-09-09 NOTE — Telephone Encounter (Signed)
Received notification about bone density.  We had ordered her mammogram at her visit.  I have reordered her bone density.  If agreeable, please schedule both.

## 2021-09-09 NOTE — Assessment & Plan Note (Signed)
Bruising on skin.  Discussed thin skin.  No unusual bleeding.  No pain.  Follow.

## 2021-09-09 NOTE — Assessment & Plan Note (Addendum)
On fosamax.  Started in 2017.   Continue calcium, vitamin  D and weight bearing exercise.  Bone density ordered. Will need to discuss holding treatment.

## 2021-09-09 NOTE — Assessment & Plan Note (Signed)
Low carb diet and exercise.  Follow met b and a1c.   Lab Results  Component Value Date   HGBA1C 5.9 08/30/2021

## 2021-09-09 NOTE — Assessment & Plan Note (Signed)
Followed by [facility].  Stable.  Evaluated 08/10/21 - stable.  Recommended f/u in one year.  

## 2021-09-09 NOTE — Assessment & Plan Note (Signed)
Evaluated 08/10/21 - Duplex ultrasound shows 1-39% stenosis bilaterally. Recommended f/u in 12 months.  

## 2021-09-09 NOTE — Assessment & Plan Note (Signed)
S/p amputation - toe.  Her toe is pushing on her adjacent toe.  Has seen podiatry.  Discussed f/u. Having some discomfort in her ankle as well.  She desires to be referred back to podiatry to discuss treatment options.

## 2021-10-05 DIAGNOSIS — M21612 Bunion of left foot: Secondary | ICD-10-CM | POA: Diagnosis not present

## 2021-10-05 DIAGNOSIS — M2012 Hallux valgus (acquired), left foot: Secondary | ICD-10-CM | POA: Diagnosis not present

## 2021-10-31 DIAGNOSIS — Z933 Colostomy status: Secondary | ICD-10-CM | POA: Diagnosis not present

## 2021-10-31 DIAGNOSIS — Z85038 Personal history of other malignant neoplasm of large intestine: Secondary | ICD-10-CM | POA: Diagnosis not present

## 2021-12-15 DIAGNOSIS — M3501 Sicca syndrome with keratoconjunctivitis: Secondary | ICD-10-CM | POA: Diagnosis not present

## 2021-12-20 ENCOUNTER — Ambulatory Visit
Admission: RE | Admit: 2021-12-20 | Discharge: 2021-12-20 | Disposition: A | Discharge status: Home / Self Care | Payer: Medicare Other | Source: Ambulatory Visit | Attending: Internal Medicine | Admitting: Internal Medicine

## 2021-12-20 DIAGNOSIS — M8589 Other specified disorders of bone density and structure, multiple sites: Secondary | ICD-10-CM | POA: Diagnosis not present

## 2021-12-20 DIAGNOSIS — Z1231 Encounter for screening mammogram for malignant neoplasm of breast: Secondary | ICD-10-CM

## 2021-12-20 DIAGNOSIS — Z780 Asymptomatic menopausal state: Secondary | ICD-10-CM | POA: Diagnosis not present

## 2021-12-20 DIAGNOSIS — E2839 Other primary ovarian failure: Secondary | ICD-10-CM | POA: Diagnosis not present

## 2022-01-01 ENCOUNTER — Other Ambulatory Visit (INDEPENDENT_AMBULATORY_CARE_PROVIDER_SITE_OTHER): Payer: Medicare Other

## 2022-01-01 DIAGNOSIS — I10 Essential (primary) hypertension: Secondary | ICD-10-CM

## 2022-01-01 DIAGNOSIS — E7800 Pure hypercholesterolemia, unspecified: Secondary | ICD-10-CM | POA: Diagnosis not present

## 2022-01-01 DIAGNOSIS — R739 Hyperglycemia, unspecified: Secondary | ICD-10-CM

## 2022-01-01 LAB — TSH: TSH: 3.62 u[IU]/mL (ref 0.35–5.50)

## 2022-01-01 LAB — BASIC METABOLIC PANEL
BUN: 23 mg/dL (ref 6–23)
CO2: 34 mEq/L — ABNORMAL HIGH (ref 19–32)
Calcium: 9.3 mg/dL (ref 8.4–10.5)
Chloride: 103 mEq/L (ref 96–112)
Creatinine, Ser: 0.95 mg/dL (ref 0.40–1.20)
GFR: 55.1 mL/min — ABNORMAL LOW (ref >60.00)
Glucose, Bld: 120 mg/dL — ABNORMAL HIGH (ref 70–99)
Potassium: 4.8 mEq/L (ref 3.5–5.1)
Sodium: 142 mEq/L (ref 135–145)

## 2022-01-01 LAB — HEPATIC FUNCTION PANEL
ALT: 23 U/L (ref 0–35)
AST: 22 U/L (ref 0–37)
Albumin: 3.9 g/dL (ref 3.5–5.2)
Alkaline Phosphatase: 45 U/L (ref 39–117)
Bilirubin, Direct: 0.1 mg/dL (ref 0.0–0.3)
Total Bilirubin: 0.7 mg/dL (ref 0.2–1.2)
Total Protein: 6.2 g/dL (ref 6.0–8.3)

## 2022-01-01 LAB — HEMOGLOBIN A1C: Hgb A1c MFr Bld: 5.9 % (ref 4.6–6.5)

## 2022-01-01 LAB — LIPID PANEL
Cholesterol: 102 mg/dL (ref 0–200)
HDL: 36.7 mg/dL — ABNORMAL LOW (ref >39.00)
LDL Cholesterol: 49 mg/dL (ref 0–99)
NonHDL: 65.19
Total CHOL/HDL Ratio: 3
Triglycerides: 82 mg/dL (ref 0.0–149.0)
VLDL: 16.4 mg/dL (ref 0.0–40.0)

## 2022-01-04 ENCOUNTER — Encounter: Payer: Self-pay | Admitting: Internal Medicine

## 2022-01-04 ENCOUNTER — Ambulatory Visit (INDEPENDENT_AMBULATORY_CARE_PROVIDER_SITE_OTHER): Payer: Medicare Other | Admitting: Internal Medicine

## 2022-01-04 VITALS — BP 132/70 | HR 80 | Temp 97.7°F | Resp 16 | Ht 63.0 in | Wt 120.2 lb

## 2022-01-04 DIAGNOSIS — S98132A Complete traumatic amputation of one left lesser toe, initial encounter: Secondary | ICD-10-CM

## 2022-01-04 DIAGNOSIS — Z933 Colostomy status: Secondary | ICD-10-CM | POA: Diagnosis not present

## 2022-01-04 DIAGNOSIS — E7800 Pure hypercholesterolemia, unspecified: Secondary | ICD-10-CM | POA: Diagnosis not present

## 2022-01-04 DIAGNOSIS — Z0000 Encounter for general adult medical examination without abnormal findings: Secondary | ICD-10-CM

## 2022-01-04 DIAGNOSIS — I6523 Occlusion and stenosis of bilateral carotid arteries: Secondary | ICD-10-CM | POA: Diagnosis not present

## 2022-01-04 DIAGNOSIS — Z85048 Personal history of other malignant neoplasm of rectum, rectosigmoid junction, and anus: Secondary | ICD-10-CM

## 2022-01-04 DIAGNOSIS — I771 Stricture of artery: Secondary | ICD-10-CM

## 2022-01-04 DIAGNOSIS — M8580 Other specified disorders of bone density and structure, unspecified site: Secondary | ICD-10-CM

## 2022-01-04 DIAGNOSIS — I10 Essential (primary) hypertension: Secondary | ICD-10-CM

## 2022-01-04 DIAGNOSIS — R739 Hyperglycemia, unspecified: Secondary | ICD-10-CM

## 2022-01-04 LAB — MICROALBUMIN / CREATININE URINE RATIO
Creatinine,U: 56.2 mg/dL
Microalb Creat Ratio: 7.2 mg/g (ref 0.0–30.0)
Microalb, Ur: 4 mg/dL — ABNORMAL HIGH (ref 0.0–1.9)

## 2022-01-04 NOTE — Assessment & Plan Note (Signed)
Physical today 01/04/22.  Mammogram 12/20/21 - Birads I.  Colonoscopy 03/21/20 - recommended f/u in 5 years.

## 2022-01-04 NOTE — Progress Notes (Signed)
[name], female   DOB: 1935/06/26, 86 y.o.   MRN: [id number]   Subjective:    [name], female    DOB: 1935/06/29, 86 y.o.   MRN: [id number]   Patient here for  Chief Complaint  Patient presents with   Annual Exam    CPE   .   HPI Here for her physical exam.  Reports she is doing relatively well.  Did see Dr Vickki Muff for f/u hallux valgus of left foot.  Recommended to continue to follow.  Discussed with her today.  She tries to stay active.  No chest pain or sob reported.  No abdominal pain.   Bone density 11/2021 - osteopenia.  Mammogram ok.  History of rectal cancer.  Followed by Dr Grayland Ormond. Ostomy working well.    Past Medical History:  Diagnosis Date   Colon cancer [facility])    History of shingles    twice   Hypercholesteremia    Hypertension    Left carotid bruit    Nephrolithiasis    Neuropathy    Personal history of chemotherapy 2014   Colon   Personal history of radiation therapy 2014   Colon   Urine incontinence 03/06/2013   h/o   Past Surgical History:  Procedure Laterality Date   APPENDECTOMY  1979   colorectal   CESAREAN SECTION     COLONOSCOPY WITH PROPOFOL N/A 03/21/2015   Procedure: COLONOSCOPY WITH PROPOFOL;  Surgeon: Hulen Luster, MD;  Location: [facility] ENDOSCOPY;  Service: Gastroenterology;  Laterality: N/A;   COLONOSCOPY WITH PROPOFOL N/A 03/21/2020   Procedure: COLONOSCOPY WITH PROPOFOL;  Surgeon: Lesly Rubenstein, MD;  Location: [facility] ENDOSCOPY;  Service: Endoscopy;  Laterality: N/A;   COLOSTOMY     surgery for colorectal cancer     VENTRAL HERNIA REPAIR Right [phone]   Family History  Problem Relation Age of Onset   Hypertension Mother    Hypertension Father    Breast cancer Paternal Aunt    Social History   Socioeconomic History   Marital status: Widowed    Spouse name: Not on file   Number of children: Not on file   Years of education: Not on file   Highest education level: Not on file  Occupational History    Not on file  Tobacco Use   Smoking status: Never   Smokeless tobacco: Never  Vaping Use   Vaping Use: Never used  Substance and Sexual Activity   Alcohol use: No    Alcohol/week: 0.0 standard drinks of alcohol   Drug use: No   Sexual activity: Not Currently  Other Topics Concern   Not on file  Social History Narrative   Not on file   Social Determinants of Health   Financial Resource Strain: Low Risk  (03/21/2021)   Overall Financial Resource Strain (CARDIA)    Difficulty of Paying Living Expenses: Not hard at all  Food Insecurity: No Food Insecurity (03/21/2021)   Hunger Vital Sign    Worried About Running Out of Food in the Last Year: Never true    [place] in the Last Year: Never true  Transportation Needs: No Transportation Needs (03/21/2021)   PRAPARE - Hydrologist (Medical): No    Lack of Transportation (Non-Medical): No  Physical Activity: Insufficiently Active (03/21/2021)   Exercise Vital Sign    Days of Exercise per Week: 4 days    Minutes of Exercise per Session: 20 min  Stress:  No Stress Concern Present (03/21/2021)   [place]    Feeling of Stress : Not at all  Social Connections: Moderately Integrated (03/21/2021)   Social Connection and Isolation Panel [NHANES]    Frequency of Communication with Friends and Family: More than three times a week    Frequency of Social Gatherings with Friends and Family: Not on file    Attends Religious Services: More than 4 times per year    Active Member of [employer] or Organizations: Yes    Attends Archivist Meetings: More than 4 times per year    Marital Status: Widowed     Review of Systems  Constitutional:  Negative for appetite change and unexpected weight change.  HENT:  Negative for congestion, sinus pressure and sore throat.   Eyes:  Negative for pain and visual disturbance.  Respiratory:  Negative for  cough, chest tightness and shortness of breath.   Cardiovascular:  Negative for chest pain, palpitations and leg swelling.  Gastrointestinal:  Negative for abdominal pain, diarrhea, nausea and vomiting.  Genitourinary:  Negative for difficulty urinating and dysuria.  Musculoskeletal:  Negative for joint swelling and myalgias.  Skin:  Negative for color change and rash.  Neurological:  Negative for dizziness and headaches.  Hematological:  Negative for adenopathy. Does not bruise/bleed easily.  Psychiatric/Behavioral:  Negative for agitation and dysphoric mood.        Objective:     BP 132/70 (BP Location: Left Arm, Patient Position: Sitting, Cuff Size: Small)   Pulse 80   Temp 97.7 F (36.5 C) (Temporal)   Resp 16   Ht _0  (1.6 m)   Wt 120 lb 3.2 oz (54.5 kg)   SpO2 97%   BMI 21.29 kg/m  Wt Readings from Last 3 Encounters:  01/04/22 120 lb 3.2 oz (54.5 kg)  09/04/21 125 lb 12.8 oz (57.1 kg)  08/18/21 127 lb (57.6 kg)    Physical Exam Vitals reviewed.  Constitutional:      General: She is not in acute distress.    Appearance: Normal appearance. She is well-developed.  HENT:     Head: Normocephalic and atraumatic.     Right Ear: External ear normal.     Left Ear: External ear normal.  Eyes:     General: No scleral icterus.       Right eye: No discharge.        Left eye: No discharge.     Conjunctiva/sclera: Conjunctivae normal.  Neck:     Thyroid: No thyromegaly.  Cardiovascular:     Rate and Rhythm: Normal rate and regular rhythm.  Pulmonary:     Effort: No tachypnea, accessory muscle usage or respiratory distress.     Breath sounds: Normal breath sounds. No decreased breath sounds or wheezing.  Chest:  Breasts:    Right: No inverted nipple, mass, nipple discharge or tenderness (no axillary adenopathy).     Left: No inverted nipple, mass, nipple discharge or tenderness (no axilarry adenopathy).  Abdominal:     General: Bowel sounds are normal.      Palpations: Abdomen is soft.     Tenderness: There is no abdominal tenderness.     Comments: No pain around ostomy site.   Musculoskeletal:        General: No swelling or tenderness.     Cervical back: Neck supple.  Lymphadenopathy:     Cervical: No cervical adenopathy.  Skin:    Findings: No erythema  or rash.  Neurological:     Mental Status: She is alert and oriented to person, place, and time.  Psychiatric:        Mood and Affect: Mood normal.        Behavior: Behavior normal.      Outpatient Encounter Medications as of 01/04/2022  Medication Sig   alendronate (FOSAMAX) 70 MG tablet Take with a full glass of water on an empty stomach.   aspirin EC 81 MG tablet Take 81 mg by mouth daily.   Biotin 1000 MCG tablet Take 1,000 mcg by mouth 2 (two) times daily.    calcium citrate-vitamin D (CITRACAL+D) 315-200 MG-UNIT per tablet Take 1 tablet by mouth 2 (two) times daily.   Cholecalciferol (VITAMIN D3) 5000 units CAPS Take 1 capsule by mouth daily.   glucosamine-chondroitin 500-400 MG tablet Take 1 tablet by mouth 2 (two) times daily.   loratadine (CLARITIN) 10 MG tablet Take 10 mg by mouth daily as needed.   losartan-hydrochlorothiazide (HYZAAR) 50-12.5 MG tablet TAKE 1 TABLET BY MOUTH DAILY   lovastatin (MEVACOR) 10 MG tablet TAKE 1 TABLET(10 MG) BY MOUTH AT BEDTIME   Multiple Vitamin (MULTI-VITAMINS) TABS Take 1 tablet by mouth daily.    Facility-Administered Encounter Medications as of 01/04/2022  Medication   sodium chloride flush (NS) 0.9 % injection 10 mL   sodium chloride flush (NS) 0.9 % injection 10 mL     Lab Results  Component Value Date   WBC 7.5 08/16/2021   HGB 12.6 08/16/2021   HCT 37.5 08/16/2021   PLT 257 08/16/2021   GLUCOSE 120 (H) 01/01/2022   CHOL 102 01/01/2022   TRIG 82.0 01/01/2022   HDL 36.70 (L) 01/01/2022   LDLCALC 49 01/01/2022   ALT 23 01/01/2022   AST 22 01/01/2022   NA 142 01/01/2022   K 4.8 01/01/2022   CL 103 01/01/2022   CREATININE  0.95 01/01/2022   BUN 23 01/01/2022   CO2 34 (H) 01/01/2022   TSH 3.62 01/01/2022   INR 1.1 10/21/2012   HGBA1C 5.9 01/01/2022   MICROALBUR 4.0 (H) 01/04/2022    MM 3D SCREEN BREAST BILATERAL  Result Date: 12/25/2021 CLINICAL DATA:  Screening. EXAM: DIGITAL SCREENING BILATERAL MAMMOGRAM WITH TOMOSYNTHESIS AND CAD TECHNIQUE: Bilateral screening digital craniocaudal and mediolateral oblique mammograms were obtained. Bilateral screening digital breast tomosynthesis was performed. The images were evaluated with computer-aided detection. COMPARISON:  Previous exam(s). ACR Breast Density Category c: The breast tissue is heterogeneously dense, which may obscure small masses. FINDINGS: There are no findings suspicious for malignancy. IMPRESSION: No mammographic evidence of malignancy. A result letter of this screening mammogram will be mailed directly to the patient. RECOMMENDATION: Screening mammogram in one year. (Code:SM-B-01Y) BI-RADS CATEGORY  1: Negative. Electronically Signed   By: Kristopher Oppenheim M.D.   On: 12/25/2021 10:14   DG Bone Density  Result Date: 12/20/2021 EXAM: DUAL X-RAY ABSORPTIOMETRY (DXA) FOR BONE MINERAL DENSITY IMPRESSION: Your patient Christina Mathis completed a BMD test on 12/20/2021 using the [employer] (software version: 14.10) manufactured by [employer]. The following summarizes the results of our evaluation. Technologist: MTB PATIENT BIOGRAPHICAL: Name: Christina, Mathis Patient ID: [id number] Birth Date: July 04, 1935 Height: 63.0 in. Gender: Female Exam Date: 12/20/2021 Weight: 121.0 lbs. Indications: Height Loss, Postmenopausal, Osteopenia, History of Fracture (Adult), History of colon cancer, History of Chemo, History of Radiation, Caucasian Fractures: Right humerus Treatments: Calcium, Fosamax, Multi-Vitamin with calcium, Vitamin D DENSITOMETRY RESULTS: Site  Region     Measured Date Measured Age WHO Classification Young Adult T-score BMD          %Change vs. Previous Significant Change (*) DualFemur Neck Left 14/20/[age] 84.2 Osteopenia -2.0 0.756 g/cm2 -2.6% - DualFemur Neck Left 12/17/2018 81.2 Osteopenia -1.9 0.776 g/cm2 0.9% - DualFemur Neck Left 10/04/2015 78.0 Osteopenia -1.9 0.769 g/cm2 - - DualFemur Total Mean 12/20/2023 84.2 Osteopenia -1.8 0.777 g/cm2 2.1% - DualFemur Total Mean 12/17/2018 81.2 Osteopenia -2.0 0.761 g/cm2 1.3% - DualFemur Total Mean 10/04/2015 78.0 Osteopenia -2.0 0.751 g/cm2 - - Left Forearm Radius 33% 12/20/2021 84.2 Osteopenia -1.4 0.756 g/cm2 -1.7% - Left Forearm Radius 33% 12/17/2018 81.2 Osteopenia -1.2 0.769 g/cm2 -0.4% - Left Forearm Radius 33% 10/04/2015 78.0 Osteopenia -1.2 0.772 g/cm2 - - ASSESSMENT: The BMD measured at Femur Neck Left is 0.756 g/cm2 with a T-score of -2.0. This patient is considered osteopenic according to [facility]) criteria. The scan quality is good. Lumbar spine was not utilized due to advanced degenerative changes. Patient is not a candidate for FRAX due to Fosamax. Compared with prior study, there has been no significant change in the total hip. World Pharmacologist [facility]) criteria for post-menopausal, Caucasian Women: Normal:                   T-score at or above -1 SD Osteopenia/low bone mass: T-score between -1 and -2.5 SD Osteoporosis:             T-score at or below -2.5 SD RECOMMENDATIONS: 1. All patients should optimize calcium and vitamin D intake. 2. Consider FDA-approved medical therapies in postmenopausal women and men aged 3 years and older, based on the following: a. A hip or vertebral(clinical or morphometric) fracture b. T-score < -2.5 at the femoral neck or spine after appropriate evaluation to exclude secondary causes c. Low bone mass (T-score between -1.0 and -2.5 at the femoral neck or spine) and a 10-year probability of a hip fracture > 3% or a 10-year probability of a major osteoporosis-related fracture > 20% based on the [place]-adapted WHO algorithm 3.  Clinician judgment and/or patient preferences may indicate treatment for people with 10-year fracture probabilities above or below these levels FOLLOW-UP: People with diagnosed cases of osteoporosis or at high risk for fracture should have regular bone mineral density tests. For patients eligible for Medicare, routine testing is allowed once every 2 years. The testing frequency can be increased to one year for patients who have rapidly progressing disease, those who are receiving or discontinuing medical therapy to restore bone mass, or have additional risk factors. I have reviewed this report, and agree with the above findings. Mark A. Thornton Papas, M.D. [facility] Electronically Signed   By: Lavonia Dana M.D.   On: 12/20/2021 10:53       Assessment & Plan:   Problem List Items Addressed This Visit     Amputated toe of left foot ([place])    S/p amputation - toe.  Her toe is pushing on her adjacent toe.  Saw Dr Vickki Muff.  Recommended to follow and no further intervention at this time.       Carotid artery disease ([place])    Evaluated 08/10/21 - Duplex ultrasound shows 1-39% stenosis bilaterally. Recommended f/u in 12 months.       Colostomy status ([place])    Ostomy working well.  Follow.       Health care maintenance    Physical today 01/04/22.  Mammogram 12/20/21 - Birads I.  Colonoscopy 03/21/20 - recommended f/u in 5 years.          Relevant Orders   Urine Microalbumin w/creat. ratio (Completed)   History of rectal cancer    Saw oncology 07/2021 - recent colonoscopy in February 2022 was reported as normal with no evidence of progression or recurrent disease.  Recommendation was repeat in 5 years. Her CEA has reduced down to 2.4 from 2.7. Recent CT scan showed no sign of disease recurrence. Follow up in 1 year or sooner as needed.       Hypercholesterolemia    On lovastatin.  Low cholesterol diet and exercise.  Follow lipid panel and liver function tests.   Lab Results  Component Value  Date   CHOL 102 01/01/2022   HDL 36.70 (L) 01/01/2022   LDLCALC 49 01/01/2022   TRIG 82.0 01/01/2022   CHOLHDL 3 01/01/2022       Relevant Orders   Basic metabolic panel   Hepatic function panel   Lipid panel   Hyperglycemia    Low carb diet and exercise.  Follow met b and a1c.   Lab Results  Component Value Date   HGBA1C 5.9 01/01/2022       Relevant Orders   Hemoglobin A1c   Hypertension, essential    Continue losartan/hctz.  Blood pressure as outlined.  Follow pressures.  Follow metabolic panel.       Osteopenia    On fosamax.  Started in 2017.   Continue calcium, vitamin  D and weight bearing exercise.  Bone density reviewed.  Given > 5 years of treatment, will hold fosamax.  Follow bone density.       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  Stable.  Evaluated 08/10/21 - stable.  Recommended f/u in one year.       Other Visit Diagnoses     Routine general medical examination at a health care facility    -  Primary        Einar Pheasant, MD

## 2022-01-04 NOTE — Patient Instructions (Signed)
Stop fosamax

## 2022-01-08 ENCOUNTER — Encounter: Payer: Self-pay | Admitting: Internal Medicine

## 2022-01-08 NOTE — Assessment & Plan Note (Signed)
Ostomy working well.  Follow.

## 2022-01-08 NOTE — Assessment & Plan Note (Signed)
On lovastatin.  Low cholesterol diet and exercise.  Follow lipid panel and liver function tests.   Lab Results  Component Value Date   CHOL 102 01/01/2022   HDL 36.70 (L) 01/01/2022   LDLCALC 49 01/01/2022   TRIG 82.0 01/01/2022   CHOLHDL 3 01/01/2022

## 2022-01-08 NOTE — Assessment & Plan Note (Signed)
On fosamax.  Started in 2017.   Continue calcium, vitamin  D and weight bearing exercise.  Bone density reviewed.  Given > 5 years of treatment, will hold fosamax.  Follow bone density.

## 2022-01-08 NOTE — Assessment & Plan Note (Signed)
Saw oncology 07/2021 - recent colonoscopy in February 2022 was reported as normal with no evidence of progression or recurrent disease.  Recommendation was repeat in 5 years. Her CEA has reduced down to 2.4 from 2.7. Recent CT scan showed no sign of disease recurrence. Follow up in 1 year or sooner as needed.

## 2022-01-08 NOTE — Assessment & Plan Note (Signed)
Continue losartan/hctz.  Blood pressure as outlined.  Follow pressures.  Follow metabolic panel.

## 2022-01-08 NOTE — Assessment & Plan Note (Signed)
S/p amputation - toe.  Her toe is pushing on her adjacent toe.  Saw Dr Vickki Muff.  Recommended to follow and no further intervention at this time.

## 2022-01-08 NOTE — Assessment & Plan Note (Signed)
Low carb diet and exercise.  Follow met b and a1c.   Lab Results  Component Value Date   HGBA1C 5.9 01/01/2022

## 2022-01-08 NOTE — Assessment & Plan Note (Signed)
Followed by [facility].  Stable.  Evaluated 08/10/21 - stable.  Recommended f/u in one year.

## 2022-01-08 NOTE — Assessment & Plan Note (Signed)
Evaluated 08/10/21 - Duplex ultrasound shows 1-39% stenosis bilaterally. Recommended f/u in 12 months.

## 2022-01-10 ENCOUNTER — Encounter: Payer: Self-pay | Admitting: Family Medicine

## 2022-01-10 ENCOUNTER — Ambulatory Visit: Payer: Medicare Other | Admitting: Family Medicine

## 2022-01-10 VITALS — BP 140/70 | HR 56 | Temp 97.8°F | Ht 63.0 in | Wt 126.4 lb

## 2022-01-10 DIAGNOSIS — M795 Residual foreign body in soft tissue: Secondary | ICD-10-CM | POA: Diagnosis not present

## 2022-01-10 NOTE — Progress Notes (Signed)
Kaiden Dardis T. Ariela Mochizuki, MD, [place] at [facility] [place], [address]  Phone: [phone]  FAX: [phone]  Christina Mathis - 86 y.o. female  MRN [id number]  Date of Birth: 23-Jun-1935  Date: 01/10/2022  PCP: Einar Pheasant, MD  Referral: Einar Pheasant, MD  Chief Complaint  Patient presents with   Finger Laceration    Right Thumb-Cut on yogurt cup and thinks there is still a piece of plastic in her thumb   Subjective:   Christina Mathis is a 86 y.o. very pleasant female patient with Body mass index is 22.39 kg/m. who presents with the following:  Cut her R thumb on a piece of plastic.   Very pleasant well-known patient for many years who presents with an injury to her thumb from 3 weeks ago.  She was cleaning a yogurt container that was plastic, and at that time she thinks she may have gotten a small sliver of plastic in her first digit on the right.  She is right-hand dominant.  She is not tried to do anything about this thus far, and it is not really causing any significant pain.  She can feel that it is palpable on her thumb.  Review of Systems is noted in the HPI, as appropriate  Objective:   BP (!) 140/70   Pulse (!) 56   Temp 97.8 F (36.6 C) (Oral)   Ht '5\' 3"'$  (1.6 m)   Wt 126 lb 6 oz (57.3 kg)   SpO2 99%   BMI 22.39 kg/m   GEN: No acute distress; alert,appropriate. PULM: Breathing comfortably in no respiratory distress PSYCH: Normally interactive.   There is no laceration or puncture.  Laboratory and Imaging Data:  Assessment and Plan:     ICD-10-CM   1. Foreign body (FB) in soft tissue  M79.5      Essentially this is a very small plastic splinter in the patient's first digit on the palmar surface.  I prepped the area with alcohol, then I used a 25-gauge needle in the area in question.  I tried to unearth the tip of the splinter and I additionally used some tweezers.  After multiple  passes, I was not able to remove any foreign body.  Band-Aid was placed.  Tried to reassure the patient, now with an opening, it the foreign body/splinter might come out on its own.  Additionally, it may simply walled off and not bother her anymore.  It is not painful at all right now.  Disposition: No follow-ups on file.  [employer] One speech-to-text software was used for transcription in this dictation.  Possible transcriptional errors can occur using Editor, commissioning.   Signed,  Maud Deed. Jerame Hedding, MD   Outpatient Encounter Medications as of 01/10/2022  Medication Sig   alendronate (FOSAMAX) 70 MG tablet Take with a full glass of water on an empty stomach.   aspirin EC 81 MG tablet Take 81 mg by mouth daily as needed.   Biotin 1000 MCG tablet Take 1,000 mcg by mouth 2 (two) times daily.    calcium citrate-vitamin D (CITRACAL+D) 315-200 MG-UNIT per tablet Take 1 tablet by mouth 2 (two) times daily.   Cholecalciferol (VITAMIN D3) 5000 units CAPS Take 1 capsule by mouth daily.   glucosamine-chondroitin 500-400 MG tablet Take 1 tablet by mouth 2 (two) times daily.   loratadine (CLARITIN) 10 MG tablet Take 10 mg by mouth daily as needed.   losartan-hydrochlorothiazide (HYZAAR) 50-12.5 MG  tablet TAKE 1 TABLET BY MOUTH DAILY   lovastatin (MEVACOR) 10 MG tablet TAKE 1 TABLET(10 MG) BY MOUTH AT BEDTIME   Multiple Vitamin (MULTI-VITAMINS) TABS Take 1 tablet by mouth daily.    Facility-Administered Encounter Medications as of 01/10/2022  Medication   sodium chloride flush (NS) 0.9 % injection 10 mL   sodium chloride flush (NS) 0.9 % injection 10 mL

## 2022-02-07 ENCOUNTER — Other Ambulatory Visit: Payer: Self-pay | Admitting: Internal Medicine

## 2022-03-28 ENCOUNTER — Other Ambulatory Visit: Payer: Self-pay | Admitting: Internal Medicine

## 2022-04-02 ENCOUNTER — Telehealth: Payer: Self-pay | Admitting: Internal Medicine

## 2022-04-02 NOTE — Telephone Encounter (Signed)
Contacted Christina Mathis to schedule their annual wellness visit. Appointment made for 04/09/2022.  Thank you,  [place] Direct dial  [phone]

## 2022-04-09 ENCOUNTER — Telehealth: Payer: Self-pay

## 2022-04-09 NOTE — Telephone Encounter (Signed)
No answer when called for scheduled AWV. Let message. Okay to reschedule.

## 2022-04-10 ENCOUNTER — Telehealth: Payer: Self-pay | Admitting: Internal Medicine

## 2022-04-10 NOTE — Telephone Encounter (Signed)
Copied from [place] [phone]. Topic: Medicare AWV >> Apr 10, 2022  9:33 AM Lollie Marrow wrote: Reason for CRM: Called patient to schedule Medicare Annual Wellness Visit (AWV). Left message for patient to call back and schedule Medicare Annual Wellness Visit (AWV). Patient missed appointment on 04/09/2022.  Last date of AWV: 03/21/2021  Please schedule an [facility] appointment at any time with Nurse Health Advisor.  If any questions, please contact me at [phone].    Thank you,  [place] Direct dial  [phone]

## 2022-04-12 ENCOUNTER — Ambulatory Visit (INDEPENDENT_AMBULATORY_CARE_PROVIDER_SITE_OTHER): Payer: Medicare Other

## 2022-04-12 VITALS — Ht 63.0 in | Wt 120.0 lb

## 2022-04-12 DIAGNOSIS — Z0000 Encounter for general adult medical examination without abnormal findings: Secondary | ICD-10-CM

## 2022-04-12 NOTE — Progress Notes (Signed)
Subjective:   Christina Mathis is a 87 y.o. female who presents for Medicare Annual (Subsequent) preventive examination.  Review of Systems    No ROS.  Medicare Wellness Virtual Visit.  Visual/audio telehealth visit, UTA vital signs.   See social history for additional risk factors.   Cardiac Risk Factors include: advanced age (>69mn, >>26women)     Objective:    Today's Vitals   04/12/22 1607  Weight: 120 lb (54.4 kg)  Height: '5\' 3"'$  (1.6 m)   Body mass index is 21.26 kg/m.     04/12/2022    4:19 PM 08/18/2021   11:06 AM 03/21/2021    8:43 AM 03/21/2020    7:24 AM 03/18/2020    8:48 AM 03/18/2019    8:43 AM 08/13/2018   11:15 AM  Advanced Directives  Does Patient Have a Medical Advance Directive? Yes Yes Yes Yes Yes Yes No  Type of AParamedicof [place] will [place] will [place] will Living will [place] will [place]  Does patient want to make changes to medical advance directive? No - Patient declined  No - Patient declined  No - Patient declined No - Patient declined   Copy of [place] Chart? No - copy requested  No - copy requested  No - copy requested No - copy requested   Would patient like information on creating a medical advance directive?       No - Patient declined    Current Medications (verified) Outpatient Encounter Medications as of 04/12/2022  Medication Sig   alendronate (FOSAMAX) 70 MG tablet Take with a full glass of water on an empty stomach.   aspirin EC 81 MG tablet Take 81 mg by mouth daily as needed.   Biotin 1000 MCG tablet Take 1,000 mcg by mouth 2 (two) times daily.    calcium citrate-vitamin D (CITRACAL+D) 315-200 MG-UNIT per tablet Take 1 tablet by mouth 2 (two) times daily.   Cholecalciferol (VITAMIN D3) 5000 units CAPS Take 1 capsule by mouth daily.   glucosamine-chondroitin 500-400 MG  tablet Take 1 tablet by mouth 2 (two) times daily.   loratadine (CLARITIN) 10 MG tablet Take 10 mg by mouth daily as needed.   losartan-hydrochlorothiazide (HYZAAR) 50-12.5 MG tablet TAKE 1 TABLET BY MOUTH DAILY   lovastatin (MEVACOR) 10 MG tablet TAKE 1 TABLET(10 MG) BY MOUTH AT BEDTIME   Multiple Vitamin (MULTI-VITAMINS) TABS Take 1 tablet by mouth daily.    Facility-Administered Encounter Medications as of 04/12/2022  Medication   sodium chloride flush (NS) 0.9 % injection 10 mL   sodium chloride flush (NS) 0.9 % injection 10 mL    Allergies (verified) Patient has no known allergies.   History: Past Medical History:  Diagnosis Date   [place]    History of shingles    twice   Hypercholesteremia    Hypertension    Left carotid bruit    Nephrolithiasis    Neuropathy    Personal history of chemotherapy 2014   Colon   Personal history of radiation therapy 2014   Colon   Urine incontinence 03/06/2013   h/o   Past Surgical History:  Procedure Laterality Date   APPENDECTOMY  1979   colorectal   CESAREAN SECTION     COLONOSCOPY WITH PROPOFOL N/A 03/21/2015   Procedure: COLONOSCOPY WITH PROPOFOL;  Surgeon: PHulen Luster MD;  Location: [facility] ENDOSCOPY;  Service: Gastroenterology;  Laterality: N/A;   COLONOSCOPY WITH PROPOFOL N/A 03/21/2020   Procedure: COLONOSCOPY WITH PROPOFOL;  Surgeon: Lesly Rubenstein, MD;  Location: [facility] ENDOSCOPY;  Service: Endoscopy;  Laterality: N/A;   COLOSTOMY     surgery for colorectal cancer     VENTRAL HERNIA REPAIR Right [id number]   Family History  Problem Relation Age of Onset   Hypertension Mother    Hypertension Father    Breast cancer Paternal Aunt    Social History   Socioeconomic History   Marital status: Widowed    Spouse name: Not on file   Number of children: Not on file   Years of education: Not on file   Highest education level: Not on file  Occupational History   Not on file  Tobacco Use   Smoking status: Never    Smokeless tobacco: Never  Vaping Use   Vaping Use: Never used  Substance and Sexual Activity   Alcohol use: No    Alcohol/week: 0.0 standard drinks of alcohol   Drug use: No   Sexual activity: Not Currently  Other Topics Concern   Not on file  Social History Narrative   Not on file   Social Determinants of Health   Financial Resource Strain: Low Risk  (04/12/2022)   Overall Financial Resource Strain (CARDIA)    Difficulty of Paying Living Expenses: Not hard at all  Food Insecurity: No Food Insecurity (04/12/2022)   Hunger Vital Sign    Worried About Running Out of Food in the Last Year: Never true    [place] in the Last Year: Never true  Transportation Needs: No Transportation Needs (04/12/2022)   PRAPARE - Hydrologist (Medical): No    Lack of Transportation (Non-Medical): No  Physical Activity: Insufficiently Active (04/12/2022)   Exercise Vital Sign    Days of Exercise per Week: 4 days    Minutes of Exercise per Session: 10 min  Stress: No Stress Concern Present (04/12/2022)   [place]    Feeling of Stress : Not at all  Social Connections: Moderately Integrated (04/12/2022)   Social Connection and Isolation Panel [NHANES]    Frequency of Communication with Friends and Family: More than three times a week    Frequency of Social Gatherings with Friends and Family: Not on file    Attends Religious Services: More than 4 times per year    Active Member of [employer] or Organizations: Yes    Attends Archivist Meetings: More than 4 times per year    Marital Status: Widowed    Tobacco Counseling Counseling given: Not Answered   Clinical Intake:  Pre-visit preparation completed: Yes        Diabetes:  (Monitored by pcp)  How often do you need to have someone help you when you read instructions, pamphlets, or other written materials from your doctor or pharmacy?: 1  - Never    Interpreter Needed?: No      Activities of Daily Living    04/12/2022    4:09 PM  In your present state of health, do you have any difficulty performing the following activities:  Hearing? 0  Vision? 0  Difficulty concentrating or making decisions? 0  Walking or climbing stairs? 0  Dressing or bathing? 0  Doing errands, shopping? 0  Preparing Food and eating ? N  Using the Toilet? N  In the past six months, have you  accidently leaked urine? Y  Comment Managed with daily brief/pad  Do you have problems with loss of bowel control? N  Managing your Medications? N  Managing your Finances? N  Housekeeping or managing your Housekeeping? N    Patient Care Team: Einar Pheasant, MD as PCP - General (Internal Medicine) Regal, Tamala Fothergill, DPM as Consulting Physician (Podiatry) Grayland Ormond Kathlene November, MD as Consulting Physician (Oncology) Efrain Sella, MD as Consulting Physician (Gastroenterology)  Indicate any recent Medical Services you may have received from other than [facility] providers in the past year (date may be approximate).     Assessment:   This is a routine wellness examination for Esterlene.  I connected with  [name] on 04/12/22 by a audio enabled telemedicine application and verified that I am speaking with the correct person using two identifiers.  Patient Location: Home  Provider Location: Office/Clinic  I discussed the limitations of evaluation and management by telemedicine. The patient expressed understanding and agreed to proceed.   Hearing/Vision screen Hearing Screening - Comments:: Patient is able to hear conversational tones without difficulty. No issues reported. Vision Screening - Comments:: Followed by [facility] Wears corrective lenses  They have seen their ophthalmologist in the last 12 months.    Dietary issues and exercise activities discussed: Current Exercise Habits: Home exercise routine, Type of exercise: walking,  Intensity: Mild  Healthy diet Good water intake   Goals Addressed               This Visit's Progress     Patient Stated     Maintain Healthy Lifestyle (pt-stated)        Stay hydrated. Healthy diet. Stay active and continue walking for exercise as tolerated.        Depression Screen    04/12/2022    4:16 PM 01/04/2022    9:29 AM 03/21/2021    8:36 AM 01/02/2021    8:30 AM 08/31/2020    8:53 AM 04/15/2020    9:12 AM 03/18/2020    8:46 AM  PHQ 2/9 Scores  PHQ - 2 Score 0 0 0 0 0 0 0    Fall Risk    04/12/2022    4:14 PM 01/04/2022    9:29 AM 03/21/2021    8:39 AM 08/31/2020    8:53 AM 04/15/2020    9:11 AM  Fall Risk   Falls in the past year? 0 0 0 1 0  Number falls in past yr:  0 0 0 0  Injury with Fall?  0  0 0  Risk for fall due to :  Impaired mobility     Follow up Falls evaluation completed;Falls prevention discussed Falls evaluation completed Falls evaluation completed Falls evaluation completed Falls evaluation completed    FALL RISK PREVENTION PERTAINING TO THE HOME: Home free of loose throw rugs in walkways, pet beds, electrical cords, etc? Yes  Adequate lighting in your home to reduce risk of falls? Yes   ASSISTIVE DEVICES UTILIZED TO PREVENT FALLS: Life alert? No  Use of a cane, walker or w/c? No  Grab bars in the bathroom? Yes  Shower chair or bench in shower? No  Elevated toilet seat or a handicapped toilet? No   TIMED UP AND GO: Was the test performed? No .   Cognitive Function:    03/08/2017    9:34 AM  MMSE - Mini Mental State Exam  Orientation to time 5  Orientation to Place 5  Registration 3  Attention/ Calculation  5  Recall 1  Language- name 2 objects 2  Language- repeat 1  Language- follow 3 step command 3  Language- read & follow direction 1  Write a sentence 1  Copy design 1  Total score 28        04/12/2022    4:19 PM 03/18/2020    8:55 AM 03/18/2019    8:54 AM 03/14/2018    8:52 AM 03/07/2016    9:53 AM  6CIT Screen  What  Year? 0 points 0 points 0 points 0 points 0 points  What month? 0 points 0 points 0 points 0 points 0 points  What time? 0 points 0 points 0 points 0 points 0 points  Count back from 20 0 points 0 points 0 points 0 points 0 points  Months in reverse 0 points 0 points 0 points 0 points 0 points  Repeat phrase 0 points 0 points 0 points 0 points 0 points  Total Score 0 points 0 points 0 points 0 points 0 points    Immunizations Immunization History  Administered Date(s) Administered   Fluad Quad(high Dose 65+) 11/28/2018, 01/02/2021   Influenza, High Dose Seasonal PF 10/18/2015, 10/22/2016, 11/01/2017   Influenza,inj,Quad PF,6+ Mos 10/12/2014   Influenza-Unspecified 11/30/2013   PFIZER(Purple Top)SARS-COV-2 Vaccination 02/20/2019, 03/10/2019, 01/01/2020, 11/09/2020   Pneumococcal Conjugate-13 10/22/2016   Pneumococcal Polysaccharide-23 01/30/2004   Covid-19 vaccine status: Completed vaccines x4.  Shingrix Completed?: No.    Education has been provided regarding the importance of this vaccine. Patient has been advised to call insurance company to determine out of pocket expense if they have not yet received this vaccine. Advised may also receive vaccine at local pharmacy or Health Dept. Verbalized acceptance and understanding.  Screening Tests Health Maintenance  Topic Date Due   FOOT EXAM  08/30/2021   COVID-19 Vaccine (5 - 2023-24 season) 04/28/2022 (Originally 09/29/2021)   INFLUENZA VACCINE  04/29/2022 (Originally 08/29/2021)   Zoster Vaccines- Shingrix (1 of 2) 04/30/2022 (Originally 09/18/1954)   HEMOGLOBIN A1C  07/03/2022   OPHTHALMOLOGY EXAM  12/14/2022   MAMMOGRAM  12/21/2022   Diabetic kidney evaluation - eGFR measurement  01/02/2023   Diabetic kidney evaluation - Urine ACR  01/05/2023   Medicare Annual Wellness (AWV)  04/12/2023   COLONOSCOPY (Pts 45-4yr Insurance coverage will need to be confirmed)  03/21/2025   Pneumonia Vaccine 85 Years old  Completed   DEXA SCAN   Completed   HPV VACCINES  Aged Out   DTaP/Tdap/Td  Discontinued    Health Maintenance Health Maintenance Due  Topic Date Due   FOOT EXAM  08/30/2021   Lung Cancer Screening: (Low Dose CT Chest recommended if Age 85-80years, 30 pack-year currently smoking OR have quit w/in 15years.) does not qualify.   Hepatitis C Screening: does not qualify.  Vision Screening: Recommended annual ophthalmology exams for early detection of glaucoma and other disorders of the eye.  Dental Screening: Recommended annual dental exams for proper oral hygiene  Community Resource Referral / Chronic Care Management: CRR required this visit?  No   CCM required this visit?  No      Plan:     I have personally reviewed and noted the following in the patient's chart:   Medical and social history Use of alcohol, tobacco or illicit drugs  Current medications and supplements including opioid prescriptions. Patient is not currently taking opioid prescriptions. Functional ability and status Nutritional status Physical activity Advanced directives List of other physicians Hospitalizations, surgeries, and ER visits in  previous 12 months Vitals Screenings to include cognitive, depression, and falls Referrals and appointments  In addition, I have reviewed and discussed with patient certain preventive protocols, quality metrics, and best practice recommendations. A written personalized care plan for preventive services as well as general preventive health recommendations were provided to patient.     Leta Jungling, LPN   [id number]

## 2022-04-12 NOTE — Patient Instructions (Addendum)
Christina Mathis , Thank you for taking time to come for your Medicare Wellness Visit. I appreciate your ongoing commitment to your health goals. Please review the following plan we discussed and let me know if I can assist you in the future.   These are the goals we discussed:  Goals       Patient Stated     Maintain Healthy Lifestyle (pt-stated)      Stay hydrated. Healthy diet. Stay active and continue walking for exercise as tolerated.         This is a list of the screening recommended for you and due dates:  Health Maintenance  Topic Date Due   Complete foot exam   08/30/2021   COVID-19 Vaccine (5 - 2023-24 season) 04/28/2022*   Flu Shot  04/29/2022*   Zoster (Shingles) Vaccine (1 of 2) 04/30/2022*   Hemoglobin A1C  07/03/2022   Eye exam for diabetics  12/14/2022   Mammogram  12/21/2022   Yearly kidney function blood test for diabetes  01/02/2023   Yearly kidney health urinalysis for diabetes  01/05/2023   Medicare Annual Wellness Visit  04/12/2023   Colon Cancer Screening  03/21/2025   Pneumonia Vaccine  Completed   DEXA scan (bone density measurement)  Completed   HPV Vaccine  Aged Out   DTaP/Tdap/Td vaccine  Discontinued  *Topic was postponed. The date shown is not the original due date.    Advanced directives: on file  Conditions/risks identified: none new  Next appointment: Follow up in one year for your annual wellness visit   Preventive Care 65 Years and Older, Female Preventive care refers to lifestyle choices and visits with your health care provider that can promote health and wellness. What does preventive care include? A yearly physical exam. This is also called an annual well check. Dental exams once or twice a year. Routine eye exams. Ask your health care provider how often you should have your eyes checked. Personal lifestyle choices, including: Daily care of your teeth and gums. Regular physical activity. Eating a healthy diet. Avoiding tobacco  and drug use. Limiting alcohol use. Practicing safe sex. Taking low-dose aspirin every day. Taking vitamin and mineral supplements as recommended by your health care provider. What happens during an annual well check? The services and screenings done by your health care provider during your annual well check will depend on your age, overall health, lifestyle risk factors, and family history of disease. Counseling  Your health care provider may ask you questions about your: Alcohol use. Tobacco use. Drug use. Emotional well-being. Home and relationship well-being. Sexual activity. Eating habits. History of falls. Memory and ability to understand (cognition). Work and work Statistician. Reproductive health. Screening  You may have the following tests or measurements: Height, weight, and BMI. Blood pressure. Lipid and cholesterol levels. These may be checked every 5 years, or more frequently if you are over 78 years old. Skin check. Lung cancer screening. You may have this screening every year starting at age 53 if you have a 30-pack-year history of smoking and currently smoke or have quit within the past 15 years. Fecal occult blood test (FOBT) of the stool. You may have this test every year starting at age 32. Flexible sigmoidoscopy or colonoscopy. You may have a sigmoidoscopy every 5 years or a colonoscopy every 10 years starting at age 76. Hepatitis C blood test. Hepatitis B blood test. Sexually transmitted disease (STD) testing. Diabetes screening. This is done by checking your blood sugar (glucose)  after you have not eaten for a while (fasting). You may have this done every 1-3 years. Bone density scan. This is done to screen for osteoporosis. You may have this done starting at age 52. Mammogram. This may be done every 1-2 years. Talk to your health care provider about how often you should have regular mammograms. Talk with your health care provider about your test results,  treatment options, and if necessary, the need for more tests. Vaccines  Your health care provider may recommend certain vaccines, such as: Influenza vaccine. This is recommended every year. Tetanus, diphtheria, and acellular pertussis (Tdap, Td) vaccine. You may need a Td booster every 10 years. Zoster vaccine. You may need this after age 62. Pneumococcal 13-valent conjugate (PCV13) vaccine. One dose is recommended after age 77. Pneumococcal polysaccharide (PPSV23) vaccine. One dose is recommended after age 19. Talk to your health care provider about which screenings and vaccines you need and how often you need them. This information is not intended to replace advice given to you by your health care provider. Make sure you discuss any questions you have with your health care provider. Document Released: 02/11/2015 Document Revised: 10/05/2015 Document Reviewed: 11/16/2014 Elsevier Interactive Patient Education  2017 Section Prevention in the Home Falls can cause injuries. They can happen to people of all ages. There are many things you can do to make your home safe and to help prevent falls. What can I do on the outside of my home? Regularly fix the edges of walkways and driveways and fix any cracks. Remove anything that might make you trip as you walk through a door, such as a raised step or threshold. Trim any bushes or trees on the path to your home. Use bright outdoor lighting. Clear any walking paths of anything that might make someone trip, such as rocks or tools. Regularly check to see if handrails are loose or broken. Make sure that both sides of any steps have handrails. Any raised decks and porches should have guardrails on the edges. Have any leaves, snow, or ice cleared regularly. Use sand or salt on walking paths during winter. Clean up any spills in your garage right away. This includes oil or grease spills. What can I do in the bathroom? Use night  lights. Install grab bars by the toilet and in the tub and shower. Do not use towel bars as grab bars. Use non-skid mats or decals in the tub or shower. If you need to sit down in the shower, use a plastic, non-slip stool. Keep the floor dry. Clean up any water that spills on the floor as soon as it happens. Remove soap buildup in the tub or shower regularly. Attach bath mats securely with double-sided non-slip rug tape. Do not have throw rugs and other things on the floor that can make you trip. What can I do in the bedroom? Use night lights. Make sure that you have a light by your bed that is easy to reach. Do not use any sheets or blankets that are too big for your bed. They should not hang down onto the floor. Have a firm chair that has side arms. You can use this for support while you get dressed. Do not have throw rugs and other things on the floor that can make you trip. What can I do in the kitchen? Clean up any spills right away. Avoid walking on wet floors. Keep items that you use a lot in easy-to-reach places. If  you need to reach something above you, use a strong step stool that has a grab bar. Keep electrical cords out of the way. Do not use floor polish or wax that makes floors slippery. If you must use wax, use non-skid floor wax. Do not have throw rugs and other things on the floor that can make you trip. What can I do with my stairs? Do not leave any items on the stairs. Make sure that there are handrails on both sides of the stairs and use them. Fix handrails that are broken or loose. Make sure that handrails are as long as the stairways. Check any carpeting to make sure that it is firmly attached to the stairs. Fix any carpet that is loose or worn. Avoid having throw rugs at the top or bottom of the stairs. If you do have throw rugs, attach them to the floor with carpet tape. Make sure that you have a light switch at the top of the stairs and the bottom of the stairs. If  you do not have them, ask someone to add them for you. What else can I do to help prevent falls? Wear shoes that: Do not have high heels. Have rubber bottoms. Are comfortable and fit you well. Are closed at the toe. Do not wear sandals. If you use a stepladder: Make sure that it is fully opened. Do not climb a closed stepladder. Make sure that both sides of the stepladder are locked into place. Ask someone to hold it for you, if possible. Clearly mark and make sure that you can see: Any grab bars or handrails. First and last steps. Where the edge of each step is. Use tools that help you move around (mobility aids) if they are needed. These include: Canes. Walkers. Scooters. Crutches. Turn on the lights when you go into a dark area. Replace any light bulbs as soon as they burn out. Set up your furniture so you have a clear path. Avoid moving your furniture around. If any of your floors are uneven, fix them. If there are any pets around you, be aware of where they are. Review your medicines with your doctor. Some medicines can make you feel dizzy. This can increase your chance of falling. Ask your doctor what other things that you can do to help prevent falls. This information is not intended to replace advice given to you by your health care provider. Make sure you discuss any questions you have with your health care provider. Document Released: 11/11/2008 Document Revised: 06/23/2015 Document Reviewed: 02/19/2014 Elsevier Interactive Patient Education  2017 [employer].

## 2022-04-18 DIAGNOSIS — K080 Exfoliation of teeth due to systemic causes: Secondary | ICD-10-CM | POA: Diagnosis not present

## 2022-05-02 DIAGNOSIS — K080 Exfoliation of teeth due to systemic causes: Secondary | ICD-10-CM | POA: Diagnosis not present

## 2022-05-03 ENCOUNTER — Other Ambulatory Visit (INDEPENDENT_AMBULATORY_CARE_PROVIDER_SITE_OTHER): Payer: Medicare Other

## 2022-05-03 DIAGNOSIS — E7800 Pure hypercholesterolemia, unspecified: Secondary | ICD-10-CM

## 2022-05-03 DIAGNOSIS — R739 Hyperglycemia, unspecified: Secondary | ICD-10-CM | POA: Diagnosis not present

## 2022-05-03 LAB — LIPID PANEL
Cholesterol: 108 mg/dL (ref 0–200)
HDL: 38 mg/dL — ABNORMAL LOW (ref >39.00)
LDL Cholesterol: 55 mg/dL (ref 0–99)
NonHDL: 70.07
Total CHOL/HDL Ratio: 3
Triglycerides: 75 mg/dL (ref 0.0–149.0)
VLDL: 15 mg/dL (ref 0.0–40.0)

## 2022-05-03 LAB — BASIC METABOLIC PANEL
BUN: 29 mg/dL — ABNORMAL HIGH (ref 6–23)
CO2: 33 mEq/L — ABNORMAL HIGH (ref 19–32)
Calcium: 9.2 mg/dL (ref 8.4–10.5)
Chloride: 104 mEq/L (ref 96–112)
Creatinine, Ser: 0.91 mg/dL (ref 0.40–1.20)
GFR: 57.89 mL/min — ABNORMAL LOW (ref >60.00)
Glucose, Bld: 116 mg/dL — ABNORMAL HIGH (ref 70–99)
Potassium: 4.1 mEq/L (ref 3.5–5.1)
Sodium: 143 mEq/L (ref 135–145)

## 2022-05-03 LAB — HEPATIC FUNCTION PANEL
ALT: 24 U/L (ref 0–35)
AST: 27 U/L (ref 0–37)
Albumin: 3.9 g/dL (ref 3.5–5.2)
Alkaline Phosphatase: 45 U/L (ref 39–117)
Bilirubin, Direct: 0.2 mg/dL (ref 0.0–0.3)
Total Bilirubin: 0.9 mg/dL (ref 0.2–1.2)
Total Protein: 6.2 g/dL (ref 6.0–8.3)

## 2022-05-03 LAB — HEMOGLOBIN A1C: Hgb A1c MFr Bld: 5.5 % (ref 4.6–6.5)

## 2022-05-07 ENCOUNTER — Ambulatory Visit (INDEPENDENT_AMBULATORY_CARE_PROVIDER_SITE_OTHER): Payer: Medicare Other | Admitting: Internal Medicine

## 2022-05-07 VITALS — BP 128/70 | HR 60 | Temp 98.0°F | Resp 16 | Ht 63.0 in | Wt 120.2 lb

## 2022-05-07 DIAGNOSIS — R413 Other amnesia: Secondary | ICD-10-CM | POA: Diagnosis not present

## 2022-05-07 DIAGNOSIS — I10 Essential (primary) hypertension: Secondary | ICD-10-CM

## 2022-05-07 DIAGNOSIS — R739 Hyperglycemia, unspecified: Secondary | ICD-10-CM

## 2022-05-07 DIAGNOSIS — E7800 Pure hypercholesterolemia, unspecified: Secondary | ICD-10-CM

## 2022-05-07 DIAGNOSIS — I6523 Occlusion and stenosis of bilateral carotid arteries: Secondary | ICD-10-CM | POA: Diagnosis not present

## 2022-05-07 DIAGNOSIS — I4949 Other premature depolarization: Secondary | ICD-10-CM | POA: Diagnosis not present

## 2022-05-07 DIAGNOSIS — D692 Other nonthrombocytopenic purpura: Secondary | ICD-10-CM

## 2022-05-07 DIAGNOSIS — I771 Stricture of artery: Secondary | ICD-10-CM

## 2022-05-07 DIAGNOSIS — Z85048 Personal history of other malignant neoplasm of rectum, rectosigmoid junction, and anus: Secondary | ICD-10-CM

## 2022-05-07 DIAGNOSIS — Z933 Colostomy status: Secondary | ICD-10-CM

## 2022-05-07 DIAGNOSIS — S98132A Complete traumatic amputation of one left lesser toe, initial encounter: Secondary | ICD-10-CM

## 2022-05-07 NOTE — Progress Notes (Signed)
Subjective:    [name], female    DOB: 1935-12-06, 87 y.o.   MRN: [id number]  Patient here for  Chief Complaint  Patient presents with   Medical Management of Chronic Issues    HPI Here for a scheduled follow up regarding hypercholesterolemia and hypertension.  Followed by oncology - history of rectal cancer.  She is accompanied by her two sons.  History obtained from all of them.  Sons express concern regarding her memory.  Remembering names.  Repeating herself.  Has noticed worsening over the past 6 months.  Stays active.  No chest pain or sob reported.  No abdominal pain or bowel change reported.     Past Medical History:  Diagnosis Date   Colon cancer    History of shingles    twice   Hypercholesteremia    Hypertension    Left carotid bruit    Nephrolithiasis    Neuropathy    Personal history of chemotherapy 2014   Colon   Personal history of radiation therapy 2014   Colon   Urine incontinence 03/06/2013   h/o   Past Surgical History:  Procedure Laterality Date   APPENDECTOMY  1979   colorectal   CESAREAN SECTION     COLONOSCOPY WITH PROPOFOL N/A 03/21/2015   Procedure: COLONOSCOPY WITH PROPOFOL;  Surgeon: Wallace Cullens, MD;  Location: [facility] ENDOSCOPY;  Service: Gastroenterology;  Laterality: N/A;   COLONOSCOPY WITH PROPOFOL N/A 03/21/2020   Procedure: COLONOSCOPY WITH PROPOFOL;  Surgeon: Regis Bill, MD;  Location: [facility] ENDOSCOPY;  Service: Endoscopy;  Laterality: N/A;   COLOSTOMY     surgery for colorectal cancer     VENTRAL HERNIA REPAIR Right [phone]   Family History  Problem Relation Age of Onset   Hypertension Mother    Hypertension Father    Breast cancer Paternal Aunt    Social History   Socioeconomic History   Marital status: Widowed    Spouse name: Not on file   Number of children: Not on file   Years of education: Not on file   Highest education level: Not on file  Occupational History   Not on file  Tobacco Use    Smoking status: Never   Smokeless tobacco: Never  Vaping Use   Vaping Use: Never used  Substance and Sexual Activity   Alcohol use: No    Alcohol/week: 0.0 standard drinks of alcohol   Drug use: No   Sexual activity: Not Currently  Other Topics Concern   Not on file  Social History Narrative   Not on file   Social Determinants of Health   Financial Resource Strain: Low Risk  (04/12/2022)   Overall Financial Resource Strain (CARDIA)    Difficulty of Paying Living Expenses: Not hard at all  Food Insecurity: No Food Insecurity (04/12/2022)   Hunger Vital Sign    Worried About Running Out of Food in the Last Year: Never true    Ran Out of Food in the Last Year: Never true  Transportation Needs: No Transportation Needs (04/12/2022)   PRAPARE - Administrator, Civil Service (Medical): No    Lack of Transportation (Non-Medical): No  Physical Activity: Insufficiently Active (04/12/2022)   Exercise Vital Sign    Days of Exercise per Week: 4 days    Minutes of Exercise per Session: 10 min  Stress: No Stress Concern Present (04/12/2022)   [employer] of Occupational Health - Occupational Stress Questionnaire    Feeling of  Stress : Not at all  Social Connections: Moderately Integrated (04/12/2022)   Social Connection and Isolation Panel [NHANES]    Frequency of Communication with Friends and Family: More than three times a week    Frequency of Social Gatherings with Friends and Family: Not on file    Attends Religious Services: More than 4 times per year    Active Member of [employer] Financial[employer]ubs or Organizations: Yes    Attends BankerClub or Organization Meetings: More than 4 times per year    Marital Status: Widowed     Review of Systems  Constitutional:  Negative for appetite change and unexpected weight change.  HENT:  Negative for congestion and sinus pressure.   Respiratory:  Negative for cough, chest tightness and shortness of breath.   Cardiovascular:  Negative for chest pain and  palpitations.  Gastrointestinal:  Negative for abdominal pain, diarrhea, nausea and vomiting.  Genitourinary:  Negative for difficulty urinating and dysuria.  Musculoskeletal:  Negative for joint swelling and myalgias.  Skin:  Negative for color change and rash.  Neurological:  Negative for dizziness and headaches.  Psychiatric/Behavioral:  Negative for agitation and dysphoric mood.        Objective:     BP 128/70   Pulse 60   Temp 98 F (36.7 C)   Resp 16   Ht 5\' 3"  (1.6 m)   Wt 120 lb 3.2 oz (54.5 kg)   SpO2 98%   BMI 21.29 kg/m  Wt Readings from Last 3 Encounters:  05/07/22 120 lb 3.2 oz (54.5 kg)  04/12/22 120 lb (54.4 kg)  01/10/22 126 lb 6 oz (57.3 kg)    Physical Exam Vitals reviewed.  Constitutional:      General: She is not in acute distress.    Appearance: Normal appearance.  HENT:     Head: Normocephalic and atraumatic.     Right Ear: External ear normal.     Left Ear: External ear normal.  Eyes:     General: No scleral icterus.       Right eye: No discharge.        Left eye: No discharge.     Conjunctiva/sclera: Conjunctivae normal.  Neck:     Thyroid: No thyromegaly.  Cardiovascular:     Rate and Rhythm: Normal rate and regular rhythm.     Comments: Occasional premature beat.  Pulmonary:     Effort: No respiratory distress.     Breath sounds: Normal breath sounds. No wheezing.  Abdominal:     General: Bowel sounds are normal.     Palpations: Abdomen is soft.     Tenderness: There is no abdominal tenderness.  Musculoskeletal:        General: No swelling or tenderness.     Cervical back: Neck supple. No tenderness.  Lymphadenopathy:     Cervical: No cervical adenopathy.  Skin:    Findings: No erythema or rash.  Neurological:     Mental Status: She is alert.  Psychiatric:        Mood and Affect: Mood normal.        Behavior: Behavior normal.      Outpatient Encounter Medications as of 05/07/2022  Medication Sig   aspirin EC 81 MG tablet  Take 81 mg by mouth daily as needed.   Biotin 1000 MCG tablet Take 1,000 mcg by mouth 2 (two) times daily.    calcium citrate-vitamin D (CITRACAL+D) 315-200 MG-UNIT per tablet Take 1 tablet by mouth 2 (two) times daily.  Cholecalciferol (VITAMIN D3) 5000 units CAPS Take 1 capsule by mouth daily.   glucosamine-chondroitin 500-400 MG tablet Take 1 tablet by mouth 2 (two) times daily.   loratadine (CLARITIN) 10 MG tablet Take 10 mg by mouth daily as needed.   losartan-hydrochlorothiazide (HYZAAR) 50-12.5 MG tablet TAKE 1 TABLET BY MOUTH DAILY   lovastatin (MEVACOR) 10 MG tablet TAKE 1 TABLET(10 MG) BY MOUTH AT BEDTIME   Multiple Vitamin (MULTI-VITAMINS) TABS Take 1 tablet by mouth daily.    [DISCONTINUED] alendronate (FOSAMAX) 70 MG tablet Take with a full glass of water on an empty stomach.   Facility-Administered Encounter Medications as of 05/07/2022  Medication   sodium chloride flush (NS) 0.9 % injection 10 mL   sodium chloride flush (NS) 0.9 % injection 10 mL     Lab Results  Component Value Date   WBC 7.5 08/16/2021   HGB 12.6 08/16/2021   HCT 37.5 08/16/2021   PLT 257 08/16/2021   GLUCOSE 116 (H) 05/03/2022   CHOL 108 05/03/2022   TRIG 75.0 05/03/2022   HDL 38.00 (L) 05/03/2022   LDLCALC 55 05/03/2022   ALT 24 05/03/2022   AST 27 05/03/2022   NA 143 05/03/2022   K 4.1 05/03/2022   CL 104 05/03/2022   CREATININE 0.91 05/03/2022   BUN 29 (H) 05/03/2022   CO2 33 (H) 05/03/2022   TSH 3.62 01/01/2022   INR 1.1 10/21/2012   HGBA1C 5.5 05/03/2022   MICROALBUR 4.0 (H) 01/04/2022    MM 3D SCREEN BREAST BILATERAL  Result Date: 12/25/2021 CLINICAL DATA:  Screening. EXAM: DIGITAL SCREENING BILATERAL MAMMOGRAM WITH TOMOSYNTHESIS AND CAD TECHNIQUE: Bilateral screening digital craniocaudal and mediolateral oblique mammograms were obtained. Bilateral screening digital breast tomosynthesis was performed. The images were evaluated with computer-aided detection. COMPARISON:  Previous  exam(s). ACR Breast Density Category c: The breast tissue is heterogeneously dense, which may obscure small masses. FINDINGS: There are no findings suspicious for malignancy. IMPRESSION: No mammographic evidence of malignancy. A result letter of this screening mammogram will be mailed directly to the patient. RECOMMENDATION: Screening mammogram in one year. (Code:SM-B-01Y) BI-RADS CATEGORY  1: Negative. Electronically Signed   By: Sande Brothers M.D.   On: 12/25/2021 10:14   DG Bone Density  Result Date: 12/20/2021 EXAM: DUAL X-RAY ABSORPTIOMETRY (DXA) FOR BONE MINERAL DENSITY IMPRESSION: Your patient Freeda Spivey completed a BMD test on 12/20/2021 using the [employer] DXA System (software version: 14.10) manufactured by [employer]. The following summarizes the results of our evaluation. Technologist: MTB PATIENT BIOGRAPHICAL: Name: Prudie, Guthridge Patient ID: [id number] Birth Date: 06-Nov-1935 Height: 63.0 in. Gender: Female Exam Date: 12/20/2021 Weight: 121.0 lbs. Indications: Height Loss, Postmenopausal, Osteopenia, History of Fracture (Adult), History of colon cancer, History of Chemo, History of Radiation, Caucasian Fractures: Right humerus Treatments: Calcium, Fosamax, Multi-Vitamin with calcium, Vitamin D DENSITOMETRY RESULTS: Site         Region     Measured Date Measured Age WHO Classification Young Adult T-score BMD         %Change vs. Previous Significant Change (*) DualFemur Neck Left 12/20/[age] 84.2 Osteopenia -2.0 0.756 g/cm2 -2.6% - DualFemur Neck Left 12/17/2018 81.2 Osteopenia -1.9 0.776 g/cm2 0.9% - DualFemur Neck Left 10/04/2015 78.0 Osteopenia -1.9 0.769 g/cm2 - - DualFemur Total Mean 12/20/2021 84.2 Osteopenia -1.8 0.777 g/cm2 2.1% - DualFemur Total Mean 12/17/2018 81.2 Osteopenia -2.0 0.761 g/cm2 1.3% - DualFemur Total Mean 10/04/2015 78.0 Osteopenia -2.0 0.751 g/cm2 - - Left Forearm Radius 33% 12/20/2021 84.2 Osteopenia -1.4  0.756 g/cm2 -1.7% - Left Forearm Radius 33%  12/17/2018 81.2 Osteopenia -1.2 0.769 g/cm2 -0.4% - Left Forearm Radius 33% 10/04/2015 78.0 Osteopenia -1.2 0.772 g/cm2 - - ASSESSMENT: The BMD measured at Femur Neck Left is 0.756 g/cm2 with a T-score of -2.0. This patient is considered osteopenic according to World Health Organization [facility]) criteria. The scan quality is good. Lumbar spine was not utilized due to advanced degenerative changes. Patient is not a candidate for FRAX due to Fosamax. Compared with prior study, there has been no significant change in the total hip. World Science writer [facility]) criteria for post-menopausal, Caucasian Women: Normal:                   T-score at or above -1 SD Osteopenia/low bone mass: T-score between -1 and -2.5 SD Osteoporosis:             T-score at or below -2.5 SD RECOMMENDATIONS: 1. All patients should optimize calcium and vitamin D intake. 2. Consider FDA-approved medical therapies in postmenopausal women and men aged 60 years and older, based on the following: a. A hip or vertebral(clinical or morphometric) fracture b. T-score < -2.5 at the femoral neck or spine after appropriate evaluation to exclude secondary causes c. Low bone mass (T-score between -1.0 and -2.5 at the femoral neck or spine) and a 10-year probability of a hip fracture > 3% or a 10-year probability of a major osteoporosis-related fracture > 20% based on the [place]-adapted WHO algorithm 3. Clinician judgment and/or patient preferences may indicate treatment for people with 10-year fracture probabilities above or below these levels FOLLOW-UP: People with diagnosed cases of osteoporosis or at high risk for fracture should have regular bone mineral density tests. For patients eligible for Medicare, routine testing is allowed once every 2 years. The testing frequency can be increased to one year for patients who have rapidly progressing disease, those who are receiving or discontinuing medical therapy to restore bone mass, or have additional risk  factors. I have reviewed this report, and agree with the above findings. Mark A. Tyron Russell, M.D. [facility] Electronically Signed   By: Ulyses Southward M.D.   On: 12/20/2021 10:53       Assessment & Plan:  Premature beats Assessment & Plan: EKG - SR with PACs.  Currently without symptoms.  Follow.   Orders: -     EKG 12-Lead  Memory change Assessment & Plan: Two sons accompany her and report concerns regarding memory change as outlined.  Recent TSH and B12 wnl.  Discussed further w/up and evaluation.  Agreeable to formal neurology w/up.   Orders: -     Ambulatory referral to Neurology -     Vitamin B12; Future  Amputated toe of left foot Assessment & Plan: S/p amputation - toe.  Her toe is pushing on her adjacent toe.  Saw Dr Ether Griffins.  Recommended to follow and no further intervention at this time.    Bilateral carotid artery stenosis Assessment & Plan: Evaluated 08/10/21 - Duplex ultrasound shows 1-39% stenosis bilaterally. Recommended f/u in 12 months.    Colostomy status Assessment & Plan: Ostomy working well.  Follow.    History of rectal cancer Assessment & Plan: Saw oncology 07/2021 - recent colonoscopy in February 2022 was reported as normal with no evidence of progression or recurrent disease.  Recommendation was repeat in 5 years. Her CEA has reduced down to 2.4 from 2.7. Recent CT scan showed no sign of disease recurrence. Follow up in 1  year or sooner as needed.    Hypercholesterolemia Assessment & Plan: On lovastatin.  Low cholesterol diet and exercise.  Follow lipid panel and liver function tests.   Lab Results  Component Value Date   CHOL 108 05/03/2022   HDL 38.00 (L) 05/03/2022   LDLCALC 55 05/03/2022   TRIG 75.0 05/03/2022   CHOLHDL 3 05/03/2022    Orders: -     Lipid panel; Future -     Hepatic function panel; Future  Hyperglycemia Assessment & Plan: Low carb diet and exercise.  Follow met b and a1c.   Lab Results  Component Value  Date   HGBA1C 5.5 05/03/2022    Orders: -     Hemoglobin A1c; Future  Hypertension, essential Assessment & Plan: Continue losartan/hctz.  Blood pressure as outlined.  Follow pressures.  Follow metabolic panel.   Orders: -     Basic metabolic panel; Future  Senile purpura Assessment & Plan: Bruising on skin.  Thin skin.  No unusual bleeding.  No pain.  Follow.    Subclavian arterial stenosis Assessment & Plan: Followed by [facility].  Stable.  Evaluated 08/10/21 - stable.  Recommended f/u in one year.       Dale , MD

## 2022-05-13 ENCOUNTER — Encounter: Payer: Self-pay | Admitting: Internal Medicine

## 2022-05-13 NOTE — Assessment & Plan Note (Signed)
Low carb diet and exercise.  Follow met b and a1c.   Lab Results  Component Value Date   HGBA1C 5.5 05/03/2022

## 2022-05-13 NOTE — Assessment & Plan Note (Signed)
Two sons accompany her and report concerns regarding memory change as outlined.  Recent TSH and B12 wnl.  Discussed further w/up and evaluation.  Agreeable to formal neurology w/up.

## 2022-05-13 NOTE — Assessment & Plan Note (Signed)
S/p amputation - toe.  Her toe is pushing on her adjacent toe.  Saw Dr Fowler.  Recommended to follow and no further intervention at this time.  

## 2022-05-13 NOTE — Assessment & Plan Note (Signed)
Continue losartan/hctz.  Blood pressure as outlined.  Follow pressures.  Follow metabolic panel.  

## 2022-05-13 NOTE — Assessment & Plan Note (Signed)
Ostomy working well.  Follow.   

## 2022-05-13 NOTE — Assessment & Plan Note (Signed)
Saw oncology 07/2021 - recent colonoscopy in February 2022 was reported as normal with no evidence of progression or recurrent disease.  Recommendation was repeat in 5 years. Her CEA has reduced down to 2.4 from 2.7. Recent CT scan showed no sign of disease recurrence. Follow up in 1 year or sooner as needed.  

## 2022-05-13 NOTE — Assessment & Plan Note (Signed)
Evaluated 08/10/21 - Duplex ultrasound shows1-39%stenosis bilaterally. Recommended f/u in 12 months.  

## 2022-05-13 NOTE — Assessment & Plan Note (Signed)
Bruising on skin.  Thin skin.  No unusual bleeding.  No pain.  Follow.

## 2022-05-13 NOTE — Assessment & Plan Note (Signed)
Followed by [facility].  Stable.  Evaluated 08/10/21 - stable.  Recommended f/u in one year.  

## 2022-05-13 NOTE — Assessment & Plan Note (Signed)
On lovastatin.  Low cholesterol diet and exercise.  Follow lipid panel and liver function tests.   Lab Results  Component Value Date   CHOL 108 05/03/2022   HDL 38.00 (L) 05/03/2022   LDLCALC 55 05/03/2022   TRIG 75.0 05/03/2022   CHOLHDL 3 05/03/2022

## 2022-05-13 NOTE — Assessment & Plan Note (Signed)
EKG - SR with PACs.  Currently without symptoms.  Follow.

## 2022-05-14 DIAGNOSIS — K080 Exfoliation of teeth due to systemic causes: Secondary | ICD-10-CM | POA: Diagnosis not present

## 2022-05-18 ENCOUNTER — Telehealth: Payer: Self-pay | Admitting: Internal Medicine

## 2022-05-18 NOTE — Telephone Encounter (Signed)
New message    Son calling has not heard anything regarding Neurology referral.

## 2022-05-21 NOTE — Telephone Encounter (Signed)
Per Erie Noe, number provided to son.

## 2022-05-21 NOTE — Telephone Encounter (Signed)
LM for son to call back. Looks like neurology has reached out to schedule. Can call their office to set up appt. [phone]

## 2022-05-22 DIAGNOSIS — Z85038 Personal history of other malignant neoplasm of large intestine: Secondary | ICD-10-CM | POA: Diagnosis not present

## 2022-05-22 DIAGNOSIS — Z933 Colostomy status: Secondary | ICD-10-CM | POA: Diagnosis not present

## 2022-07-12 DIAGNOSIS — D2272 Melanocytic nevi of left lower limb, including hip: Secondary | ICD-10-CM | POA: Diagnosis not present

## 2022-07-12 DIAGNOSIS — D2261 Melanocytic nevi of right upper limb, including shoulder: Secondary | ICD-10-CM | POA: Diagnosis not present

## 2022-07-12 DIAGNOSIS — D2262 Melanocytic nevi of left upper limb, including shoulder: Secondary | ICD-10-CM | POA: Diagnosis not present

## 2022-07-12 DIAGNOSIS — D225 Melanocytic nevi of trunk: Secondary | ICD-10-CM | POA: Diagnosis not present

## 2022-07-19 DIAGNOSIS — G3184 Mild cognitive impairment, so stated: Secondary | ICD-10-CM | POA: Diagnosis not present

## 2022-08-06 ENCOUNTER — Other Ambulatory Visit (INDEPENDENT_AMBULATORY_CARE_PROVIDER_SITE_OTHER): Payer: Self-pay | Admitting: Vascular Surgery

## 2022-08-06 DIAGNOSIS — I6523 Occlusion and stenosis of bilateral carotid arteries: Secondary | ICD-10-CM

## 2022-08-09 ENCOUNTER — Encounter (INDEPENDENT_AMBULATORY_CARE_PROVIDER_SITE_OTHER): Payer: Medicare Other

## 2022-08-09 ENCOUNTER — Ambulatory Visit (INDEPENDENT_AMBULATORY_CARE_PROVIDER_SITE_OTHER): Payer: Medicare Other | Admitting: Vascular Surgery

## 2022-08-11 NOTE — Progress Notes (Signed)
MRN : [id number]  Christina Mathis is a 87 y.o. (March 15, 1935) female who presents with chief complaint of check carotid arteries.  History of Present Illness:   The patient is seen for follow up evaluation of carotid stenosis. The carotid stenosis followed by ultrasound.    The patient denies amaurosis fugax. There is no recent history of TIA symptoms or focal motor deficits. There is no prior documented CVA.   The patient is taking enteric-coated aspirin 81 mg daily.   There is no history of migraine headaches. There is no history of seizures.   The patient has a history of coronary artery disease, no recent episodes of angina or shortness of breath. The patient denies PAD or claudication symptoms. There is a history of hyperlipidemia which is being treated with a statin.     Carotid Duplex done today shows RICA 1-39%and LICA 40-59% stenosis. The left vertebral has antegrade flow in the right vertebral artery has retrograde flow.    No outpatient medications have been marked as taking for the 08/13/22 encounter (Appointment) with Gilda Crease, Latina Craver, MD.    Past Medical History:  Diagnosis Date   Colon cancer [facility])    History of shingles    twice   Hypercholesteremia    Hypertension    Left carotid bruit    Nephrolithiasis    Neuropathy    Personal history of chemotherapy 2014   Colon   Personal history of radiation therapy 2014   Colon   Urine incontinence 03/06/2013   h/o    Past Surgical History:  Procedure Laterality Date   APPENDECTOMY  1979   colorectal   CESAREAN SECTION     COLONOSCOPY WITH PROPOFOL N/A 03/21/2015   Procedure: COLONOSCOPY WITH PROPOFOL;  Surgeon: Wallace Cullens, MD;  Location: [facility] ENDOSCOPY;  Service: Gastroenterology;  Laterality: N/A;   COLONOSCOPY WITH PROPOFOL N/A 03/21/2020   Procedure: COLONOSCOPY WITH PROPOFOL;  Surgeon: Regis Bill, MD;  Location: [facility] ENDOSCOPY;  Service: Endoscopy;  Laterality: N/A;    COLOSTOMY     surgery for colorectal cancer     VENTRAL HERNIA REPAIR Right [phone]    Social History Social History   Tobacco Use   Smoking status: Never   Smokeless tobacco: Never  Vaping Use   Vaping status: Never Used  Substance Use Topics   Alcohol use: No    Alcohol/week: 0.0 standard drinks of alcohol   Drug use: No    Family History Family History  Problem Relation Age of Onset   Hypertension Mother    Hypertension Father    Breast cancer Paternal Aunt     No Known Allergies   REVIEW OF SYSTEMS (Negative unless checked)  Constitutional: [] Weight loss  [] Fever  [] Chills Cardiac: [] Chest pain   [] Chest pressure   [] Palpitations   [] Shortness of breath when laying flat   [] Shortness of breath with exertion. Vascular:  [x] Pain in legs with walking   [] Pain in legs at rest  [] History of DVT   [] Phlebitis   [] Swelling in legs   [] Varicose veins   [] Non-healing ulcers Pulmonary:   [] Uses home oxygen   [] Productive cough   [] Hemoptysis   [] Wheeze  [] COPD   [] Asthma Neurologic:  [] Dizziness   [] Seizures   [] History of stroke   [] History of TIA  [] Aphasia   [] Vissual changes   [] Weakness or numbness  in arm   [] Weakness or numbness in leg Musculoskeletal:   [] Joint swelling   [] Joint pain   [] Low back pain Hematologic:  [] Easy bruising  [] Easy bleeding   [] Hypercoagulable state   [] Anemic Gastrointestinal:  [] Diarrhea   [] Vomiting  [] Gastroesophageal reflux/heartburn   [] Difficulty swallowing. Genitourinary:  [] Chronic kidney disease   [] Difficult urination  [] Frequent urination   [] Blood in urine Skin:  [] Rashes   [] Ulcers  Psychological:  [] History of anxiety   []  History of major depression.  Physical Examination  There were no vitals filed for this visit. There is no height or weight on file to calculate BMI. Gen: WD/WN, NAD Head: [place]vin/AT, No temporalis wasting.  Ear/Nose/Throat: Hearing grossly intact, nares w/o erythema or drainage Eyes: PER, EOMI, sclera  nonicteric.  Neck: Supple, no masses.  No bruit or JVD.  Pulmonary:  Good air movement, no audible wheezing, no use of accessory muscles.  Cardiac: RRR, normal S1, S2, no Murmurs. Vascular:  carotid bruit noted Vessel Right Left  Radial Palpable Palpable  Carotid  Palpable  Palpable  Subclav  Palpable Palpable  Gastrointestinal: soft, non-distended. No guarding/no peritoneal signs.  Musculoskeletal: M/S 5/5 throughout.  No visible deformity.  Neurologic: CN 2-12 intact. Pain and light touch intact in extremities.  Symmetrical.  Speech is fluent. Motor exam as listed above. Psychiatric: Judgment intact, Mood & affect appropriate for pt's clinical situation. Dermatologic: No rashes or ulcers noted.  No changes consistent with cellulitis.   CBC Lab Results  Component Value Date   WBC 7.5 08/16/2021   HGB 12.6 08/16/2021   HCT 37.5 08/16/2021   MCV 104.2 (H) 08/16/2021   PLT 257 08/16/2021    BMET    Component Value Date/Time   NA 143 05/03/2022 0810   NA 140 11/30/2013 1121   K 4.1 05/03/2022 0810   K 4.0 11/30/2013 1121   CL 104 05/03/2022 0810   CL 105 11/30/2013 1121   CO2 33 (H) 05/03/2022 0810   CO2 31 11/30/2013 1121   GLUCOSE 116 (H) 05/03/2022 0810   GLUCOSE 103 (H) 11/30/2013 1121   BUN 29 (H) 05/03/2022 0810   BUN 14 11/30/2013 1121   CREATININE 0.91 05/03/2022 0810   CREATININE 0.83 11/30/2013 1121   CALCIUM 9.2 05/03/2022 0810   CALCIUM 8.2 (L) 11/30/2013 1121   GFRNONAA >60 08/16/2021 1117   GFRNONAA >60 11/30/2013 1121   GFRNONAA >60 08/31/2013 0917   GFRAA >60 08/17/2019 1024   GFRAA >60 11/30/2013 1121   GFRAA >60 08/31/2013 0917   CrCl cannot be calculated (Patient's most recent lab result is older than the maximum 21 days allowed.).  COAG Lab Results  Component Value Date   INR 1.1 10/21/2012    Radiology No results found.   Assessment/Plan 1. Bilateral carotid artery stenosis Recommend:  Given the patient's asymptomatic subcritical  stenosis no further invasive testing or surgery at this time.  Carotid Duplex shows RICA 1-39%and LICA 40-59% stenosis. The left vertebral has antegrade flow in the right vertebral artery has retrograde flow.    Continue antiplatelet therapy as prescribed Continue management of CAD, HTN and Hyperlipidemia Healthy heart diet,  encouraged exercise at least 4 times per week Follow up in 12 months with duplex ultrasound and physical exam  - VAS US CAROTID; Future  2. Subclavian arterial stenosis (HCC) She remains asymptomatic.  We will continue to monitor with annual carotid studies.   3. Hypertension, essential Continue antihypertensive medications as already ordered, these medications have been reviewed  and there are no changes at this time.  4. Gastroesophageal reflux disease, unspecified whether esophagitis present Continue PPI as already ordered, this medication has been reviewed and there are no changes at this time.  Avoidence of caffeine and alcohol  Moderate elevation of the head of the bed   5. Hypercholesterolemia Continue statin as ordered and reviewed, no changes at this time    Levora Dredge, MD  08/11/2022 4:13 PM

## 2022-08-13 ENCOUNTER — Ambulatory Visit (INDEPENDENT_AMBULATORY_CARE_PROVIDER_SITE_OTHER): Payer: Medicare Other | Admitting: Vascular Surgery

## 2022-08-13 ENCOUNTER — Ambulatory Visit (INDEPENDENT_AMBULATORY_CARE_PROVIDER_SITE_OTHER): Payer: Medicare Other

## 2022-08-13 ENCOUNTER — Encounter (INDEPENDENT_AMBULATORY_CARE_PROVIDER_SITE_OTHER): Payer: Self-pay | Admitting: Vascular Surgery

## 2022-08-13 VITALS — BP 137/55 | HR 58 | Resp 18 | Ht 65.0 in | Wt 117.6 lb

## 2022-08-13 DIAGNOSIS — I10 Essential (primary) hypertension: Secondary | ICD-10-CM | POA: Diagnosis not present

## 2022-08-13 DIAGNOSIS — I771 Stricture of artery: Secondary | ICD-10-CM

## 2022-08-13 DIAGNOSIS — E7800 Pure hypercholesterolemia, unspecified: Secondary | ICD-10-CM

## 2022-08-13 DIAGNOSIS — I6523 Occlusion and stenosis of bilateral carotid arteries: Secondary | ICD-10-CM

## 2022-08-13 DIAGNOSIS — K219 Gastro-esophageal reflux disease without esophagitis: Secondary | ICD-10-CM | POA: Diagnosis not present

## 2022-08-14 ENCOUNTER — Other Ambulatory Visit: Payer: Self-pay | Admitting: Neurology

## 2022-08-14 DIAGNOSIS — G3184 Mild cognitive impairment, so stated: Secondary | ICD-10-CM

## 2022-08-20 ENCOUNTER — Inpatient Hospital Stay (HOSPITAL_BASED_OUTPATIENT_CLINIC_OR_DEPARTMENT_OTHER): Payer: Medicare Other | Admitting: Nurse Practitioner

## 2022-08-20 ENCOUNTER — Inpatient Hospital Stay: Payer: Medicare Other | Attending: Oncology

## 2022-08-20 ENCOUNTER — Ambulatory Visit: Payer: Medicare Other | Admitting: Medical Oncology

## 2022-08-20 ENCOUNTER — Encounter: Payer: Self-pay | Admitting: Nurse Practitioner

## 2022-08-20 ENCOUNTER — Other Ambulatory Visit: Payer: Self-pay | Admitting: *Deleted

## 2022-08-20 VITALS — BP 148/55 | HR 66 | Temp 97.0°F | Wt 115.0 lb

## 2022-08-20 DIAGNOSIS — C20 Malignant neoplasm of rectum: Secondary | ICD-10-CM | POA: Diagnosis not present

## 2022-08-20 DIAGNOSIS — Z9221 Personal history of antineoplastic chemotherapy: Secondary | ICD-10-CM | POA: Diagnosis not present

## 2022-08-20 DIAGNOSIS — G629 Polyneuropathy, unspecified: Secondary | ICD-10-CM | POA: Diagnosis not present

## 2022-08-20 DIAGNOSIS — Z08 Encounter for follow-up examination after completed treatment for malignant neoplasm: Secondary | ICD-10-CM

## 2022-08-20 DIAGNOSIS — Z79899 Other long term (current) drug therapy: Secondary | ICD-10-CM | POA: Insufficient documentation

## 2022-08-20 DIAGNOSIS — Z923 Personal history of irradiation: Secondary | ICD-10-CM | POA: Insufficient documentation

## 2022-08-20 DIAGNOSIS — E7800 Pure hypercholesterolemia, unspecified: Secondary | ICD-10-CM | POA: Insufficient documentation

## 2022-08-20 DIAGNOSIS — Z803 Family history of malignant neoplasm of breast: Secondary | ICD-10-CM | POA: Diagnosis not present

## 2022-08-20 DIAGNOSIS — D7589 Other specified diseases of blood and blood-forming organs: Secondary | ICD-10-CM

## 2022-08-20 DIAGNOSIS — I10 Essential (primary) hypertension: Secondary | ICD-10-CM | POA: Diagnosis not present

## 2022-08-20 DIAGNOSIS — Z85048 Personal history of other malignant neoplasm of rectum, rectosigmoid junction, and anus: Secondary | ICD-10-CM | POA: Diagnosis not present

## 2022-08-20 LAB — CBC WITH DIFFERENTIAL/PLATELET
Abs Immature Granulocytes: 0.01 10*3/uL (ref 0.00–0.07)
Basophils Absolute: 0.1 10*3/uL (ref 0.0–0.1)
Basophils Relative: 1 %
Eosinophils Absolute: 0.1 10*3/uL (ref 0.0–0.5)
Eosinophils Relative: 1 %
HCT: 38.7 % (ref 36.0–46.0)
Hemoglobin: 12.8 g/dL (ref 12.0–15.0)
Immature Granulocytes: 0 %
Lymphocytes Relative: 10 %
Lymphs Abs: 0.7 10*3/uL (ref 0.7–4.0)
MCH: 34.9 pg — ABNORMAL HIGH (ref 26.0–34.0)
MCHC: 33.1 g/dL (ref 30.0–36.0)
MCV: 105.4 fL — ABNORMAL HIGH (ref 80.0–100.0)
Monocytes Absolute: 0.6 10*3/uL (ref 0.1–1.0)
Monocytes Relative: 9 %
Neutro Abs: 5.4 10*3/uL (ref 1.7–7.7)
Neutrophils Relative %: 79 %
Platelets: 293 10*3/uL (ref 150–400)
RBC: 3.67 MIL/uL — ABNORMAL LOW (ref 3.87–5.11)
RDW: 13.8 % (ref 11.5–15.5)
WBC: 6.9 10*3/uL (ref 4.0–10.5)
nRBC: 0 % (ref 0.0–0.2)

## 2022-08-20 LAB — CMP (CANCER CENTER ONLY)
ALT: 23 U/L (ref 0–44)
AST: 26 U/L (ref 15–41)
Albumin: 4 g/dL (ref 3.5–5.0)
Alkaline Phosphatase: 45 U/L (ref 38–126)
Anion gap: 12 (ref 5–15)
BUN: 24 mg/dL — ABNORMAL HIGH (ref 8–23)
CO2: 28 mmol/L (ref 22–32)
Calcium: 9.3 mg/dL (ref 8.9–10.3)
Chloride: 101 mmol/L (ref 98–111)
Creatinine: 0.99 mg/dL (ref 0.44–1.00)
GFR, Estimated: 56 mL/min — ABNORMAL LOW (ref >60)
Glucose, Bld: 142 mg/dL — ABNORMAL HIGH (ref 70–99)
Potassium: 3.8 mmol/L (ref 3.5–5.1)
Sodium: 141 mmol/L (ref 135–145)
Total Bilirubin: 0.9 mg/dL (ref 0.3–1.2)
Total Protein: 6.9 g/dL (ref 6.5–8.1)

## 2022-08-20 NOTE — Progress Notes (Signed)
[facility]  [phone]  [phone]   Christina Mathis DOB: 01/21/1936  MR#: [id number]  AOZ#:[id number]  Patient Care Team: Dale Hustisford, MD as PCP - General (Internal Medicine) Regal, Kirstie Peri, DPM as Consulting Physician (Podiatry) Orlie Dakin, Tollie Pizza, MD as Consulting Physician (Oncology) Stanton Kidney, MD as Consulting Physician (Gastroenterology)  CHIEF COMPLAINT: Adenocarcinoma of the rectum  INTERVAL HISTORY: Christina Mathis returns to clinic today for repeat laboratory work and routine yearly evaluation.  She continues to feel well and remains asymptomatic of recurrence. She remains active. No concerns with ostomy. No changes in stool, black or bloody stools. No unintentional weight loss, new pain.   REVIEW OF SYSTEMS:   Review of Systems  Constitutional: Negative.  Negative for fever, malaise/fatigue and weight loss.  Respiratory: Negative.  Negative for cough and shortness of breath.   Cardiovascular: Negative.  Negative for chest pain and leg swelling.  Gastrointestinal: Negative.  Negative for abdominal pain, blood in stool and melena.  Genitourinary: Negative.  Negative for dysuria.  Musculoskeletal: Negative.  Negative for back pain.  Skin: Negative.  Negative for rash.  Neurological: Negative.  Negative for tingling, sensory change, focal weakness, weakness and headaches.  Psychiatric/Behavioral: Negative.  The patient is not nervous/anxious.   As per HPI. Otherwise, a complete review of systems is negative.  ONCOLOGY HISTORY: Oncology History Overview Note  9. 87 year old female with stage III (T3, N1, M0) adenocarcinoma of the rectum for  preop chemoradiation 2. Starting 5-FU by continuous infusion and radiation therapy November 24, 2012 3. Patient is finishing up radiation and chemotherapy on December 15 of 2014 4. Status postsurgery and colostomyFebruary of 2015, pT0 pNO tumor stage 0 5. Post operative adjuvant with FOLFOX march  2015 6.patient finished total 6 cycles of chemotherapy with FOLFOX on August 31, 2013. 7.  January 2 016 Christina Mathis had hernia repair     PAST MEDICAL HISTORY: Past Medical History:  Diagnosis Date   Colon cancer [facility])    History of shingles    twice   Hypercholesteremia    Hypertension    Left carotid bruit    Nephrolithiasis    Neuropathy    Personal history of chemotherapy 2014   Colon   Personal history of radiation therapy 2014   Colon   Urine incontinence 03/06/2013   h/o    PAST SURGICAL HISTORY: Past Surgical History:  Procedure Laterality Date   APPENDECTOMY  1979   colorectal   CESAREAN SECTION     COLONOSCOPY WITH PROPOFOL N/A 03/21/2015   Procedure: COLONOSCOPY WITH PROPOFOL;  Surgeon: Wallace Cullens, MD;  Location: [facility] ENDOSCOPY;  Service: Gastroenterology;  Laterality: N/A;   COLONOSCOPY WITH PROPOFOL N/A 03/21/2020   Procedure: COLONOSCOPY WITH PROPOFOL;  Surgeon: Regis Bill, MD;  Location: [facility] ENDOSCOPY;  Service: Endoscopy;  Laterality: N/A;   COLOSTOMY     surgery for colorectal cancer     VENTRAL HERNIA REPAIR Right [phone]    FAMILY HISTORY Family History  Problem Relation Age of Onset   Hypertension Mother    Hypertension Father    Breast cancer Paternal Aunt     GYNECOLOGIC HISTORY:  No LMP recorded. Patient is postmenopausal.     ADVANCED DIRECTIVES:    HEALTH MAINTENANCE: Social History   Tobacco Use   Smoking status: Never   Smokeless tobacco: Never  Vaping Use   Vaping status: Never Used  Substance Use Topics   Alcohol use: No    Alcohol/week: 0.0 standard drinks  of alcohol   Drug use: No   No Known Allergies  Current Outpatient Medications  Medication Sig Dispense Refill   aspirin EC 81 MG tablet Take 81 mg by mouth daily as needed.     Biotin 1000 MCG tablet Take 1,000 mcg by mouth 2 (two) times daily.      calcium citrate-vitamin D (CITRACAL+D) 315-200 MG-UNIT per tablet Take 1 tablet by mouth 2 (two) times daily.      Cholecalciferol (VITAMIN D3) 5000 units CAPS Take 1 capsule by mouth daily.     donepezil (ARICEPT) 5 MG tablet Take 1 tablet by mouth at bedtime.     glucosamine-chondroitin 500-400 MG tablet Take 1 tablet by mouth 2 (two) times daily.     loratadine (CLARITIN) 10 MG tablet Take 10 mg by mouth daily as needed.     losartan-hydrochlorothiazide (HYZAAR) 50-12.5 MG tablet TAKE 1 TABLET BY MOUTH DAILY 90 tablet 3   lovastatin (MEVACOR) 10 MG tablet TAKE 1 TABLET(10 MG) BY MOUTH AT BEDTIME 90 tablet 3   Multiple Vitamin (MULTI-VITAMINS) TABS Take 1 tablet by mouth daily.      No current facility-administered medications for this visit.   Facility-Administered Medications Ordered in Other Visits  Medication Dose Route Frequency Provider Last Rate Last Admin   sodium chloride flush (NS) 0.9 % injection 10 mL  10 mL Intravenous PRN Choksi, Valarie Cones, MD   10 mL at 05/31/15 1040   sodium chloride flush (NS) 0.9 % injection 10 mL  10 mL Intravenous PRN Choksi, Valarie Cones, MD   10 mL at 07/12/15 1018    OBJECTIVE: BP (!) 148/55 (BP Location: Left Arm, Patient Position: Sitting)   Pulse 66   Temp (!) 97 F (36.1 C) (Tympanic)   Wt 115 lb (52.2 kg)   SpO2 100%   BMI 19.14 kg/m    Body mass index is 19.14 kg/m.      ECOG FS:0 - Asymptomatic  General: Well-developed, well-nourished, no acute distress. Eyes: Pink conjunctiva, anicteric sclera. Lungs: Clear to auscultation bilaterally.  No audible wheezing or coughing Heart: Regular rate and rhythm.  Abdomen: Soft, nontender, nondistended. Soft stool in colostomy bag. Musculoskeletal: No edema, cyanosis, or clubbing. Neuro: Alert, answering all questions appropriately. Cranial nerves grossly intact. Skin: No rashes or petechiae noted. Psych: Normal affect.   LAB RESULTS: Appointment on 08/20/2022  Component Date Value Ref Range Status   Sodium 08/20/2022 141  135 - 145 mmol/L Final   Potassium 08/20/2022 3.8  3.5 - 5.1 mmol/L Final   Chloride  08/20/2022 101  98 - 111 mmol/L Final   CO2 08/20/2022 28  22 - 32 mmol/L Final   Glucose, Bld 08/20/2022 142 (H)  70 - 99 mg/dL Final   Glucose reference range applies only to samples taken after fasting for at least 8 hours.   BUN 08/20/2022 24 (H)  8 - 23 mg/dL Final   Creatinine 16/10/9602 0.99  0.44 - 1.00 mg/dL Final   Calcium 54/09/8117 9.3  8.9 - 10.3 mg/dL Final   Total Protein 14/78/2956 6.9  6.5 - 8.1 g/dL Final   Albumin 21/30/8657 4.0  3.5 - 5.0 g/dL Final   AST 84/69/6295 26  15 - 41 U/L Final   ALT 08/20/2022 23  0 - 44 U/L Final   Alkaline Phosphatase 08/20/2022 45  38 - 126 U/L Final   Total Bilirubin 08/20/2022 0.9  0.3 - 1.2 mg/dL Final   GFR, Estimated 08/20/2022 56 (L)  >60 mL/min Final  Comment: (NOTE) Calculated using the CKD-EPI Creatinine Equation (2021)    Anion gap 08/20/2022 12  5 - 15 Final   Performed at [facility], [address] [place], [place] [address]   WBC 08/20/2022 6.9  4.0 - 10.5 K/uL Final   RBC 08/20/2022 3.67 (L)  3.87 - 5.11 MIL/uL Final   Hemoglobin 08/20/2022 12.8  12.0 - 15.0 g/dL Final   HCT 60/45/4098 38.7  36.0 - 46.0 % Final   MCV 08/20/2022 105.4 (H)  80.0 - 100.0 fL Final   MCH 08/20/2022 34.9 (H)  26.0 - 34.0 pg Final   MCHC 08/20/2022 33.1  30.0 - 36.0 g/dL Final   RDW 11/91/4782 13.8  11.5 - 15.5 % Final   Platelets 08/20/2022 293  150 - 400 K/uL Final   nRBC 08/20/2022 0.0  0.0 - 0.2 % Final   Neutrophils Relative % 08/20/2022 79  % Final   Neutro Abs 08/20/2022 5.4  1.7 - 7.7 K/uL Final   Lymphocytes Relative 08/20/2022 10  % Final   Lymphs Abs 08/20/2022 0.7  0.7 - 4.0 K/uL Final   Monocytes Relative 08/20/2022 9  % Final   Monocytes Absolute 08/20/2022 0.6  0.1 - 1.0 K/uL Final   Eosinophils Relative 08/20/2022 1  % Final   Eosinophils Absolute 08/20/2022 0.1  0.0 - 0.5 K/uL Final   Basophils Relative 08/20/2022 1  % Final   Basophils Absolute 08/20/2022 0.1  0.0 - 0.1 K/uL Final   Immature Granulocytes  08/20/2022 0  % Final   Abs Immature Granulocytes 08/20/2022 0.01  0.00 - 0.07 K/uL Final   Performed at [facility], [address] [place], [place] [address]   Lab Results  Component Value Date   CEA 2.3 01/11/2016    STUDIES: No results found.  ASSESSMENT:  Adenocarcinoma of the rectum, stage III, T3 N1 M0.  PLAN:    1. Rectal cancer: Patient completed 6 cycles of adjuvant FOLFOX in August 2015. Patient's most recent colonoscopy in February 2022 was reported as normal with no evidence of progression or recurrent disease. Plan is to repeat in 2027 if benefits outweighs risks given her age. Her CEA is pending at time of visit. Clinically asymptomatic of recurrent disease. Continue annual surveillance at which time we can consider releasing her to pcp for surveillance.   2.  Macrocytosis without anemia- MCV 105.4. B12 and folate were normal. No other cytopenias. Monitor.   Disposition:  1 year- lab (cbc, cmp, cea), see me for rectal cancer surveillance- la  Patient expressed understanding and was in agreement with this plan. She also understands that She can call clinic at any time with any questions, concerns, or complaints.    Alinda Dooms, NP 08/20/22

## 2022-08-21 ENCOUNTER — Encounter (INDEPENDENT_AMBULATORY_CARE_PROVIDER_SITE_OTHER): Payer: Self-pay | Admitting: Vascular Surgery

## 2022-08-21 LAB — CEA: CEA: 2.4 ng/mL (ref 0.0–4.7)

## 2022-09-03 ENCOUNTER — Other Ambulatory Visit (INDEPENDENT_AMBULATORY_CARE_PROVIDER_SITE_OTHER): Payer: Medicare Other

## 2022-09-03 DIAGNOSIS — E7800 Pure hypercholesterolemia, unspecified: Secondary | ICD-10-CM

## 2022-09-03 DIAGNOSIS — I10 Essential (primary) hypertension: Secondary | ICD-10-CM

## 2022-09-03 DIAGNOSIS — R413 Other amnesia: Secondary | ICD-10-CM

## 2022-09-03 DIAGNOSIS — R739 Hyperglycemia, unspecified: Secondary | ICD-10-CM

## 2022-09-03 LAB — LIPID PANEL
Cholesterol: 109 mg/dL (ref 0–200)
HDL: 32.9 mg/dL — ABNORMAL LOW (ref >39.00)
LDL Cholesterol: 59 mg/dL (ref 0–99)
NonHDL: 75.92
Total CHOL/HDL Ratio: 3
Triglycerides: 83 mg/dL (ref 0.0–149.0)
VLDL: 16.6 mg/dL (ref 0.0–40.0)

## 2022-09-03 LAB — BASIC METABOLIC PANEL
BUN: 21 mg/dL (ref 6–23)
CO2: 32 mEq/L (ref 19–32)
Calcium: 9.2 mg/dL (ref 8.4–10.5)
Chloride: 102 mEq/L (ref 96–112)
Creatinine, Ser: 0.87 mg/dL (ref 0.40–1.20)
GFR: 60.95 mL/min (ref >60.00)
Glucose, Bld: 118 mg/dL — ABNORMAL HIGH (ref 70–99)
Potassium: 3.7 mEq/L (ref 3.5–5.1)
Sodium: 142 mEq/L (ref 135–145)

## 2022-09-03 LAB — HEPATIC FUNCTION PANEL
ALT: 29 U/L (ref 0–35)
AST: 29 U/L (ref 0–37)
Albumin: 3.8 g/dL (ref 3.5–5.2)
Alkaline Phosphatase: 45 U/L (ref 39–117)
Bilirubin, Direct: 0.2 mg/dL (ref 0.0–0.3)
Total Bilirubin: 0.7 mg/dL (ref 0.2–1.2)
Total Protein: 6.6 g/dL (ref 6.0–8.3)

## 2022-09-03 LAB — HEMOGLOBIN A1C: Hgb A1c MFr Bld: 5.8 % (ref 4.6–6.5)

## 2022-09-03 LAB — VITAMIN B12: Vitamin B-12: 559 pg/mL (ref 211–911)

## 2022-09-04 ENCOUNTER — Encounter: Payer: Self-pay | Admitting: Neurology

## 2022-09-05 ENCOUNTER — Other Ambulatory Visit: Payer: Medicare Other

## 2022-09-06 ENCOUNTER — Ambulatory Visit (INDEPENDENT_AMBULATORY_CARE_PROVIDER_SITE_OTHER): Payer: Medicare Other | Admitting: Internal Medicine

## 2022-09-06 ENCOUNTER — Encounter: Payer: Self-pay | Admitting: Internal Medicine

## 2022-09-06 VITALS — BP 138/80 | HR 80 | Temp 98.2°F | Resp 16 | Ht 65.0 in | Wt 113.6 lb

## 2022-09-06 DIAGNOSIS — I10 Essential (primary) hypertension: Secondary | ICD-10-CM

## 2022-09-06 DIAGNOSIS — Z933 Colostomy status: Secondary | ICD-10-CM

## 2022-09-06 DIAGNOSIS — I771 Stricture of artery: Secondary | ICD-10-CM | POA: Diagnosis not present

## 2022-09-06 DIAGNOSIS — Z85048 Personal history of other malignant neoplasm of rectum, rectosigmoid junction, and anus: Secondary | ICD-10-CM

## 2022-09-06 DIAGNOSIS — I6523 Occlusion and stenosis of bilateral carotid arteries: Secondary | ICD-10-CM

## 2022-09-06 DIAGNOSIS — S98132A Complete traumatic amputation of one left lesser toe, initial encounter: Secondary | ICD-10-CM | POA: Diagnosis not present

## 2022-09-06 DIAGNOSIS — R413 Other amnesia: Secondary | ICD-10-CM

## 2022-09-06 DIAGNOSIS — R739 Hyperglycemia, unspecified: Secondary | ICD-10-CM

## 2022-09-06 DIAGNOSIS — E7800 Pure hypercholesterolemia, unspecified: Secondary | ICD-10-CM

## 2022-09-06 NOTE — Progress Notes (Signed)
Subjective:    [name], female    DOB: [birth year], 87 y.o.   MRN: [id number]  Patient here for  Chief Complaint  Patient presents with   Medical Management of Chronic Issues    HPI Here for a scheduled follow up regarding hypercholesterolemia and hypertension.  She is accompanied by her two sons.  History obtained from all three of them.  Followed by oncology - history of rectal cancer. Last colonoscopy in February 2022 was reported as normal with no evidence of progression or recurrent disease. Plan is to repeat in 2027 Previous visit, family expressed concerns regarding her memory. Saw neurology 07/19/22 - mild cognitive impariment. Started aricept.  MRI ordered. Scheduled to be done Monday 09/10/22.  Saw [facility] 07/2022 - Carotid Duplex shows RICA1-39%and LICA 40-59% stenosis. Recommended continuing antiplatelet therapy.  F/u 12 months. Overall things appear to be relatively stable. Able to get around her house and do ADLs.  No chest pain or sob reported.  No cough or congestion.  No abdominal pain.  Ostomy working well.  Memory issues - persists.  Repeating self, etc.    Past Medical History:  Diagnosis Date   Colon cancer (HCC)    History of shingles    twice   Hypercholesteremia    Hypertension    Left carotid bruit    Nephrolithiasis    Neuropathy    Personal history of chemotherapy 2014   Colon   Personal history of radiation therapy 2014   Colon   Urine incontinence 03/06/2013   h/o   Past Surgical History:  Procedure Laterality Date   APPENDECTOMY  1979   colorectal   CESAREAN SECTION     COLONOSCOPY WITH PROPOFOL N/A 03/21/2015   Procedure: COLONOSCOPY WITH PROPOFOL;  Surgeon: Wallace Cullens, MD;  Location: [facility] ENDOSCOPY;  Service: Gastroenterology;  Laterality: N/A;   COLONOSCOPY WITH PROPOFOL N/A 03/21/2020   Procedure: COLONOSCOPY WITH PROPOFOL;  Surgeon: Regis Bill, MD;  Location: [facility] ENDOSCOPY;  Service: Endoscopy;  Laterality: N/A;    COLOSTOMY     surgery for colorectal cancer     VENTRAL HERNIA REPAIR Right [phone]   Family History  Problem Relation Age of Onset   Hypertension Mother    Hypertension Father    Breast cancer Paternal Aunt    Social History   Socioeconomic History   Marital status: Widowed    Spouse name: Not on file   Number of children: Not on file   Years of education: Not on file   Highest education level: Not on file  Occupational History   Not on file  Tobacco Use   Smoking status: Never   Smokeless tobacco: Never  Vaping Use   Vaping status: Never Used  Substance and Sexual Activity   Alcohol use: No    Alcohol/week: 0.0 standard drinks of alcohol   Drug use: No   Sexual activity: Not Currently  Other Topics Concern   Not on file  Social History Narrative   Not on file   Social Determinants of Health   Financial Resource Strain: Low Risk  (04/12/2022)   Overall Financial Resource Strain (CARDIA)    Difficulty of Paying Living Expenses: Not hard at all  Food Insecurity: No Food Insecurity (04/12/2022)   Hunger Vital Sign    Worried About Running Out of Food in the Last Year: Never true    Ran Out of Food in the Last Year: Never true  Transportation Needs: No Transportation Needs (04/12/2022)  PRAPARE - Administrator, Civil Service (Medical): No    Lack of Transportation (Non-Medical): No  Physical Activity: Insufficiently Active (04/12/2022)   Exercise Vital Sign    Days of Exercise per Week: 4 days    Minutes of Exercise per Session: 10 min  Stress: No Stress Concern Present (04/12/2022)   [employer] of Occupational Health - Occupational Stress Questionnaire    Feeling of Stress : Not at all  Social Connections: Moderately Integrated (04/12/2022)   Social Connection and Isolation Panel [NHANES]    Frequency of Communication with Friends and Family: More than three times a week    Frequency of Social Gatherings with Friends and Family: Not on file     Attends Religious Services: More than 4 times per year    Active Member of [employer] or Organizations: Yes    Attends Banker Meetings: More than 4 times per year    Marital Status: Widowed     Review of Systems  Constitutional:  Negative for appetite change and unexpected weight change.  HENT:  Negative for congestion and sinus pressure.   Respiratory:  Negative for cough, chest tightness and shortness of breath.   Cardiovascular:  Negative for chest pain, palpitations and leg swelling.  Gastrointestinal:  Negative for abdominal pain, diarrhea, nausea and vomiting.  Genitourinary:  Negative for difficulty urinating and dysuria.  Musculoskeletal:  Negative for joint swelling and myalgias.  Skin:  Negative for color change and rash.  Neurological:  Negative for dizziness and headaches.  Psychiatric/Behavioral:  Negative for agitation and dysphoric mood.        Objective:     BP 138/80   Pulse 80   Temp 98.2 F (36.8 C)   Resp 16   Ht 5\' 5"  (1.651 m)   Wt 113 lb 9.6 oz (51.5 kg)   SpO2 97%   BMI 18.90 kg/m  Wt Readings from Last 3 Encounters:  09/06/22 113 lb 9.6 oz (51.5 kg)  08/20/22 115 lb (52.2 kg)  08/13/22 117 lb 9.6 oz (53.3 kg)    Physical Exam Vitals reviewed.  Constitutional:      General: She is not in acute distress.    Appearance: Normal appearance.  HENT:     Head: Normocephalic and atraumatic.     Right Ear: External ear normal.     Left Ear: External ear normal.  Eyes:     General: No scleral icterus.       Right eye: No discharge.        Left eye: No discharge.     Conjunctiva/sclera: Conjunctivae normal.  Neck:     Thyroid: No thyromegaly.  Cardiovascular:     Rate and Rhythm: Normal rate and regular rhythm.  Pulmonary:     Effort: No respiratory distress.     Breath sounds: Normal breath sounds. No wheezing.  Abdominal:     General: Bowel sounds are normal.     Palpations: Abdomen is soft.     Tenderness: There is no abdominal  tenderness.     Comments: No pain around ostomy.   Musculoskeletal:        General: No swelling or tenderness.     Cervical back: Neck supple. No tenderness.  Lymphadenopathy:     Cervical: No cervical adenopathy.  Skin:    Findings: No erythema or rash.  Neurological:     Mental Status: She is alert.  Psychiatric:        Mood and Affect: Mood  normal.        Behavior: Behavior normal.      Outpatient Encounter Medications as of 09/06/2022  Medication Sig   aspirin EC 81 MG tablet Take 81 mg by mouth daily as needed.   calcium citrate-vitamin D (CITRACAL+D) 315-200 MG-UNIT per tablet Take 1 tablet by mouth 2 (two) times daily.   donepezil (ARICEPT) 5 MG tablet Take 1 tablet by mouth at bedtime.   losartan-hydrochlorothiazide (HYZAAR) 50-12.5 MG tablet TAKE 1 TABLET BY MOUTH DAILY   lovastatin (MEVACOR) 10 MG tablet TAKE 1 TABLET(10 MG) BY MOUTH AT BEDTIME   Multiple Vitamin (MULTI-VITAMINS) TABS Take 1 tablet by mouth daily.    [DISCONTINUED] Biotin 1000 MCG tablet Take 1,000 mcg by mouth 2 (two) times daily.    [DISCONTINUED] Cholecalciferol (VITAMIN D3) 5000 units CAPS Take 1 capsule by mouth daily.   [DISCONTINUED] glucosamine-chondroitin 500-400 MG tablet Take 1 tablet by mouth 2 (two) times daily.   [DISCONTINUED] loratadine (CLARITIN) 10 MG tablet Take 10 mg by mouth daily as needed.   Facility-Administered Encounter Medications as of 09/06/2022  Medication   sodium chloride flush (NS) 0.9 % injection 10 mL   [DISCONTINUED] sodium chloride flush (NS) 0.9 % injection 10 mL     Lab Results  Component Value Date   WBC 6.9 08/20/2022   HGB 12.8 08/20/2022   HCT 38.7 08/20/2022   PLT 293 08/20/2022   GLUCOSE 118 (H) 09/03/2022   CHOL 109 09/03/2022   TRIG 83.0 09/03/2022   HDL 32.90 (L) 09/03/2022   LDLCALC 59 09/03/2022   ALT 29 09/03/2022   AST 29 09/03/2022   NA 142 09/03/2022   K 3.7 09/03/2022   CL 102 09/03/2022   CREATININE 0.87 09/03/2022   BUN 21 09/03/2022    CO2 32 09/03/2022   TSH 3.62 01/01/2022   INR 1.1 10/21/2012   HGBA1C 5.8 09/03/2022   MICROALBUR 4.0 (H) 01/04/2022    MM 3D SCREEN BREAST BILATERAL  Result Date: 12/25/2021 CLINICAL DATA:  Screening. EXAM: DIGITAL SCREENING BILATERAL MAMMOGRAM WITH TOMOSYNTHESIS AND CAD TECHNIQUE: Bilateral screening digital craniocaudal and mediolateral oblique mammograms were obtained. Bilateral screening digital breast tomosynthesis was performed. The images were evaluated with computer-aided detection. COMPARISON:  Previous exam(s). ACR Breast Density Category c: The breast tissue is heterogeneously dense, which may obscure small masses. FINDINGS: There are no findings suspicious for malignancy. IMPRESSION: No mammographic evidence of malignancy. A result letter of this screening mammogram will be mailed directly to the patient. RECOMMENDATION: Screening mammogram in one year. (Code:SM-B-01Y) BI-RADS CATEGORY  1: Negative. Electronically Signed   By: Sande Brothers M.D.   On: 12/25/2021 10:14   DG Bone Density  Result Date: 12/20/2021 EXAM: DUAL X-RAY ABSORPTIOMETRY (DXA) FOR BONE MINERAL DENSITY IMPRESSION: Your patient Christina Mathis completed a BMD test on 12/20/2021 using the [employer] DXA System (software version: 14.10) manufactured by [employer]. The following summarizes the results of our evaluation. Technologist: MTB PATIENT BIOGRAPHICAL: Name: Christina Mathis, Christina Mathis Patient ID: [id number] Birth Date: 1935-08-05 Height: 63.0 in. Gender: Female Exam Date: 12/20/2021 Weight: 121.0 lbs. Indications: Height Loss, Postmenopausal, Osteopenia, History of Fracture (Adult), History of colon cancer, History of Chemo, History of Radiation, Caucasian Fractures: Right humerus Treatments: Calcium, Fosamax, Multi-Vitamin with calcium, Vitamin D DENSITOMETRY RESULTS: Site         Region     Measured Date Measured Age WHO Classification Young Adult T-score BMD         %Change vs. Previous Significant Change  (*)  DualFemur Neck Left 14/20/[age] 84.2 Osteopenia -2.0 0.756 g/cm2 -2.6% - DualFemur Neck Left 12/17/2018 81.2 Osteopenia -1.9 0.776 g/cm2 0.9% - DualFemur Neck Left 10/04/2015 78.0 Osteopenia -1.9 0.769 g/cm2 - - DualFemur Total Mean 12/20/2021 84.2 Osteopenia -1.8 0.777 g/cm2 2.1% - DualFemur Total Mean 12/17/2018 81.2 Osteopenia -2.0 0.761 g/cm2 1.3% - DualFemur Total Mean 10/04/2015 78.0 Osteopenia -2.0 0.751 g/cm2 - - Left Forearm Radius 33% 12/20/2021 84.2 Osteopenia -1.4 0.756 g/cm2 -1.7% - Left Forearm Radius 33% 12/17/2018 81.2 Osteopenia -1.2 0.769 g/cm2 -0.4% - Left Forearm Radius 33% 10/04/2015 78.0 Osteopenia -1.2 0.772 g/cm2 - - ASSESSMENT: The BMD measured at Femur Neck Left is 0.756 g/cm2 with a T-score of -2.0. This patient is considered osteopenic according to World Health Organization [facility]) criteria. The scan quality is good. Lumbar spine was not utilized due to advanced degenerative changes. Patient is not a candidate for FRAX due to Fosamax. Compared with prior study, there has been no significant change in the total hip. World Science writer [facility]) criteria for post-menopausal, Caucasian Women: Normal:                   T-score at or above -1 SD Osteopenia/low bone mass: T-score between -1 and -2.5 SD Osteoporosis:             T-score at or below -2.5 SD RECOMMENDATIONS: 1. All patients should optimize calcium and vitamin D intake. 2. Consider FDA-approved medical therapies in postmenopausal women and men aged 28 years and older, based on the following: a. A hip or vertebral(clinical or morphometric) fracture b. T-score < -2.5 at the femoral neck or spine after appropriate evaluation to exclude secondary causes c. Low bone mass (T-score between -1.0 and -2.5 at the femoral neck or spine) and a 10-year probability of a hip fracture > 3% or a 10-year probability of a major osteoporosis-related fracture > 20% based on the [place]-adapted WHO algorithm 3. Clinician judgment and/or patient  preferences may indicate treatment for people with 10-year fracture probabilities above or below these levels FOLLOW-UP: People with diagnosed cases of osteoporosis or at high risk for fracture should have regular bone mineral density tests. For patients eligible for Medicare, routine testing is allowed once every 2 years. The testing frequency can be increased to one year for patients who have rapidly progressing disease, those who are receiving or discontinuing medical therapy to restore bone mass, or have additional risk factors. I have reviewed this report, and agree with the above findings. Mark A. Tyron Russell, M.D. [facility] Electronically Signed   By: Ulyses Southward M.D.   On: 12/20/2021 10:53       Assessment & Plan:  Amputated toe of left foot (HCC) Assessment & Plan: S/p amputation - toe.  Her toe is pushing on her adjacent toe.  Saw Dr Ether Griffins.  Recommended to follow and no further intervention at this time.    Bilateral carotid artery stenosis Assessment & Plan:  Saw [facility] 07/2022 - Carotid Duplex shows RICA1-39%and LICA 40-59% stenosis. Recommended continuing antiplatelet therapy.  F/u 12 months.   Colostomy status (HCC) Assessment & Plan: Ostomy working well.  Follow.    Subclavian arterial stenosis (HCC) Assessment & Plan: Followed by [facility].  Stable.  Evaluated 7/24 - stable.  Recommended f/u in one year.    History of rectal cancer Assessment & Plan: Saw oncology 08/20/22- colonoscopy in February 2022 was reported as normal with no evidence of progression or recurrent disease.  Recommendation was repeat in 5 years. CT  scan showed no sign of disease recurrence. Follow up in 1 year or sooner as needed.    Hypercholesterolemia Assessment & Plan: On lovastatin.  Low cholesterol diet and exercise.  Follow lipid panel and liver function tests.   Lab Results  Component Value Date   CHOL 109 09/03/2022   HDL 32.90 (L) 09/03/2022   LDLCALC 59 09/03/2022   TRIG 83.0  09/03/2022   CHOLHDL 3 09/03/2022     Hyperglycemia Assessment & Plan: Low carb diet and exercise.  Follow met b and a1c.   Lab Results  Component Value Date   HGBA1C 5.8 09/03/2022     Hypertension, essential Assessment & Plan: Continue losartan/hctz.  Blood pressure as outlined.  Follow pressures.  Follow metabolic panel.    Memory change Assessment & Plan: Two sons accompany her and report concerns regarding memory change as outlined. 06/2022 - Dr Sherryll Burger - start aricept.  Scheduled for MRI Monday.        Dale Coos, MD

## 2022-09-09 ENCOUNTER — Encounter: Payer: Self-pay | Admitting: Internal Medicine

## 2022-09-09 NOTE — Assessment & Plan Note (Addendum)
Saw [facility] 07/2022 - Carotid Duplex shows RICA1-39%and LICA 40-59% stenosis. Recommended continuing antiplatelet therapy.  F/u 12 months.

## 2022-09-09 NOTE — Assessment & Plan Note (Signed)
Followed by [facility].  Stable.  Evaluated 7/24 - stable.  Recommended f/u in one year.

## 2022-09-09 NOTE — Assessment & Plan Note (Signed)
Low carb diet and exercise.  Follow met b and a1c.   Lab Results  Component Value Date   HGBA1C 5.8 09/03/2022

## 2022-09-09 NOTE — Assessment & Plan Note (Signed)
S/p amputation - toe.  Her toe is pushing on her adjacent toe.  Saw Dr Vickki Muff.  Recommended to follow and no further intervention at this time.

## 2022-09-09 NOTE — Assessment & Plan Note (Signed)
Ostomy working well.  Follow.  

## 2022-09-09 NOTE — Assessment & Plan Note (Signed)
On lovastatin.  Low cholesterol diet and exercise.  Follow lipid panel and liver function tests.   Lab Results  Component Value Date   CHOL 109 09/03/2022   HDL 32.90 (L) 09/03/2022   LDLCALC 59 09/03/2022   TRIG 83.0 09/03/2022   CHOLHDL 3 09/03/2022

## 2022-09-09 NOTE — Assessment & Plan Note (Addendum)
Two sons accompany her and report concerns regarding memory change as outlined. 06/2022 - Dr Sherryll Burger - start aricept.  Scheduled for MRI Monday.

## 2022-09-09 NOTE — Assessment & Plan Note (Signed)
Saw oncology 08/20/22- colonoscopy in February 2022 was reported as normal with no evidence of progression or recurrent disease.  Recommendation was repeat in 5 years. CT scan showed no sign of disease recurrence. Follow up in 1 year or sooner as needed.

## 2022-09-09 NOTE — Assessment & Plan Note (Signed)
Continue losartan/hctz.  Blood pressure as outlined.  Follow pressures.  Follow metabolic panel.  

## 2022-09-10 ENCOUNTER — Ambulatory Visit
Admission: RE | Admit: 2022-09-10 | Discharge: 2022-09-10 | Disposition: A | Discharge status: Home / Self Care | Payer: Medicare Other | Source: Ambulatory Visit | Attending: Neurology | Admitting: Neurology

## 2022-09-10 DIAGNOSIS — G3184 Mild cognitive impairment, so stated: Secondary | ICD-10-CM | POA: Diagnosis not present

## 2022-10-31 DIAGNOSIS — K080 Exfoliation of teeth due to systemic causes: Secondary | ICD-10-CM | POA: Diagnosis not present

## 2022-12-17 DIAGNOSIS — Z7189 Other specified counseling: Secondary | ICD-10-CM | POA: Diagnosis not present

## 2022-12-17 DIAGNOSIS — G939 Disorder of brain, unspecified: Secondary | ICD-10-CM | POA: Diagnosis not present

## 2022-12-17 DIAGNOSIS — G301 Alzheimer's disease with late onset: Secondary | ICD-10-CM | POA: Diagnosis not present

## 2022-12-17 DIAGNOSIS — G3184 Mild cognitive impairment, so stated: Secondary | ICD-10-CM | POA: Diagnosis not present

## 2022-12-25 DIAGNOSIS — H2513 Age-related nuclear cataract, bilateral: Secondary | ICD-10-CM | POA: Diagnosis not present

## 2022-12-25 DIAGNOSIS — H40003 Preglaucoma, unspecified, bilateral: Secondary | ICD-10-CM | POA: Diagnosis not present

## 2022-12-25 DIAGNOSIS — Z0100 Encounter for examination of eyes and vision without abnormal findings: Secondary | ICD-10-CM | POA: Diagnosis not present

## 2023-01-03 ENCOUNTER — Telehealth: Payer: Self-pay | Admitting: Internal Medicine

## 2023-01-03 DIAGNOSIS — E7800 Pure hypercholesterolemia, unspecified: Secondary | ICD-10-CM

## 2023-01-03 DIAGNOSIS — R739 Hyperglycemia, unspecified: Secondary | ICD-10-CM

## 2023-01-03 NOTE — Telephone Encounter (Signed)
Patient need lab orders.

## 2023-01-03 NOTE — Telephone Encounter (Signed)
Labs ordered.

## 2023-01-04 ENCOUNTER — Other Ambulatory Visit (INDEPENDENT_AMBULATORY_CARE_PROVIDER_SITE_OTHER): Payer: Medicare Other

## 2023-01-04 DIAGNOSIS — R739 Hyperglycemia, unspecified: Secondary | ICD-10-CM | POA: Diagnosis not present

## 2023-01-04 DIAGNOSIS — E7800 Pure hypercholesterolemia, unspecified: Secondary | ICD-10-CM | POA: Diagnosis not present

## 2023-01-04 LAB — BASIC METABOLIC PANEL
BUN: 25 mg/dL — ABNORMAL HIGH (ref 6–23)
CO2: 36 meq/L — ABNORMAL HIGH (ref 19–32)
Calcium: 9.4 mg/dL (ref 8.4–10.5)
Chloride: 99 meq/L (ref 96–112)
Creatinine, Ser: 0.93 mg/dL (ref 0.40–1.20)
GFR: 56.13 mL/min — ABNORMAL LOW (ref >60.00)
Glucose, Bld: 138 mg/dL — ABNORMAL HIGH (ref 70–99)
Potassium: 3.9 meq/L (ref 3.5–5.1)
Sodium: 142 meq/L (ref 135–145)

## 2023-01-04 LAB — LIPID PANEL
Cholesterol: 108 mg/dL (ref 0–200)
HDL: 36.3 mg/dL — ABNORMAL LOW (ref >39.00)
LDL Cholesterol: 57 mg/dL (ref 0–99)
NonHDL: 71.43
Total CHOL/HDL Ratio: 3
Triglycerides: 72 mg/dL (ref 0.0–149.0)
VLDL: 14.4 mg/dL (ref 0.0–40.0)

## 2023-01-04 LAB — HEPATIC FUNCTION PANEL
ALT: 22 U/L (ref 0–35)
AST: 23 U/L (ref 0–37)
Albumin: 3.9 g/dL (ref 3.5–5.2)
Alkaline Phosphatase: 49 U/L (ref 39–117)
Bilirubin, Direct: 0.2 mg/dL (ref 0.0–0.3)
Total Bilirubin: 0.9 mg/dL (ref 0.2–1.2)
Total Protein: 6.3 g/dL (ref 6.0–8.3)

## 2023-01-04 LAB — HEMOGLOBIN A1C: Hgb A1c MFr Bld: 5.8 % (ref 4.6–6.5)

## 2023-01-09 ENCOUNTER — Ambulatory Visit: Payer: Medicare Other | Admitting: Internal Medicine

## 2023-01-09 VITALS — BP 128/72 | HR 81 | Temp 98.0°F | Resp 16 | Ht 61.0 in | Wt 113.0 lb

## 2023-01-09 DIAGNOSIS — I10 Essential (primary) hypertension: Secondary | ICD-10-CM

## 2023-01-09 DIAGNOSIS — Z23 Encounter for immunization: Secondary | ICD-10-CM

## 2023-01-09 DIAGNOSIS — E7800 Pure hypercholesterolemia, unspecified: Secondary | ICD-10-CM

## 2023-01-09 DIAGNOSIS — I6523 Occlusion and stenosis of bilateral carotid arteries: Secondary | ICD-10-CM | POA: Diagnosis not present

## 2023-01-09 DIAGNOSIS — Z85048 Personal history of other malignant neoplasm of rectum, rectosigmoid junction, and anus: Secondary | ICD-10-CM

## 2023-01-09 DIAGNOSIS — Z933 Colostomy status: Secondary | ICD-10-CM | POA: Diagnosis not present

## 2023-01-09 DIAGNOSIS — R413 Other amnesia: Secondary | ICD-10-CM

## 2023-01-09 DIAGNOSIS — S98132A Complete traumatic amputation of one left lesser toe, initial encounter: Secondary | ICD-10-CM

## 2023-01-09 DIAGNOSIS — Z1231 Encounter for screening mammogram for malignant neoplasm of breast: Secondary | ICD-10-CM

## 2023-01-09 DIAGNOSIS — R739 Hyperglycemia, unspecified: Secondary | ICD-10-CM

## 2023-01-09 DIAGNOSIS — J320 Chronic maxillary sinusitis: Secondary | ICD-10-CM

## 2023-01-09 DIAGNOSIS — I771 Stricture of artery: Secondary | ICD-10-CM

## 2023-01-09 NOTE — Progress Notes (Signed)
Subjective:    [name], female    DOB: 20-Mar-1935, 87 y.o.   MRN: [id number]  Patient here for  Chief Complaint  Patient presents with   Medical Management of Chronic Issues    HPI Here for a scheduled follow up regarding hypercholesterolemia and hypertension. Followed by oncology - history of rectal cancer. Last colonoscopy in February 2022 was reported as normal with no evidence of progression or recurrent disease. Plan is to repeat in 2027. Saw [facility] 07/2022 - Carotid Duplex shows RICA1-39%and LICA 40-59% stenosis. Recommended continuing antiplatelet therapy.  F/u 12 months. Saw neurology for cognitive impairment.  MRI 09/10/22 - No acute finding. Mild parenchymal volume loss without discernible lobar  predilection or disproportionate hippocampal atrophy, and  mild-to-moderate underlying chronic small-vessel ischemic change. Chronic right sphenoid sinusitis with probable inspissated  secretions and/or fungal colonization, and layering fluid in the  maxillary sinuses which may reflect acute sinusitis in the correct clinical setting. Small right mastoid effusion. Recommended to continue aricept and ENT evaluation. She comes in today accompanied by her two sons.  History obtained from both of them. Reports things are relatively stable. No chest pain reported.  Breathing stable. No abdominal pain or bowel change. Memory stable.    Past Medical History:  Diagnosis Date   Colon cancer [facility])    History of shingles    twice   Hypercholesteremia    Hypertension    Left carotid bruit    Nephrolithiasis    Neuropathy    Personal history of chemotherapy 2014   Colon   Personal history of radiation therapy 2014   Colon   Urine incontinence 03/06/2013   h/o   Past Surgical History:  Procedure Laterality Date   APPENDECTOMY  1979   colorectal   CESAREAN SECTION     COLONOSCOPY WITH PROPOFOL N/A 03/21/2015   Procedure: COLONOSCOPY WITH PROPOFOL;  Surgeon: Wallace Cullens, MD;   Location: [facility] ENDOSCOPY;  Service: Gastroenterology;  Laterality: N/A;   COLONOSCOPY WITH PROPOFOL N/A 03/21/2020   Procedure: COLONOSCOPY WITH PROPOFOL;  Surgeon: Regis Bill, MD;  Location: [facility] ENDOSCOPY;  Service: Endoscopy;  Laterality: N/A;   COLOSTOMY     surgery for colorectal cancer     VENTRAL HERNIA REPAIR Right [id number]   Family History  Problem Relation Age of Onset   Hypertension Mother    Hypertension Father    Breast cancer Paternal Aunt    Social History   Socioeconomic History   Marital status: Widowed    Spouse name: Not on file   Number of children: Not on file   Years of education: Not on file   Highest education level: Not on file  Occupational History   Not on file  Tobacco Use   Smoking status: Never   Smokeless tobacco: Never  Vaping Use   Vaping status: Never Used  Substance and Sexual Activity   Alcohol use: No    Alcohol/week: 0.0 standard drinks of alcohol   Drug use: No   Sexual activity: Not Currently  Other Topics Concern   Not on file  Social History Narrative   Not on file   Social Drivers of Health   Financial Resource Strain: Low Risk  (04/12/2022)   Overall Financial Resource Strain (CARDIA)    Difficulty of Paying Living Expenses: Not hard at all  Food Insecurity: No Food Insecurity (04/12/2022)   Hunger Vital Sign    Worried About Running Out of Food in the Last Year: Never  true    Ran Out of Food in the Last Year: Never true  Transportation Needs: No Transportation Needs (04/12/2022)   PRAPARE - Administrator, Civil Service (Medical): No    Lack of Transportation (Non-Medical): No  Physical Activity: Insufficiently Active (04/12/2022)   Exercise Vital Sign    Days of Exercise per Week: 4 days    Minutes of Exercise per Session: 10 min  Stress: No Stress Concern Present (04/12/2022)   [employer] of Occupational Health - Occupational Stress Questionnaire    Feeling of Stress : Not at all  Social  Connections: Moderately Integrated (04/12/2022)   Social Connection and Isolation Panel [NHANES]    Frequency of Communication with Friends and Family: More than three times a week    Frequency of Social Gatherings with Friends and Family: Not on file    Attends Religious Services: More than 4 times per year    Active Member of [employer] or Organizations: Yes    Attends Banker Meetings: More than 4 times per year    Marital Status: Widowed     Review of Systems  Constitutional:  Negative for appetite change and unexpected weight change.  HENT:  Negative for congestion and sinus pressure.   Respiratory:  Negative for cough and chest tightness.        Breathing stable.   Cardiovascular:  Negative for chest pain and palpitations.  Gastrointestinal:  Negative for abdominal pain, diarrhea, nausea and vomiting.  Genitourinary:  Negative for difficulty urinating and dysuria.  Musculoskeletal:  Negative for joint swelling and myalgias.  Skin:  Negative for color change and rash.  Neurological:  Negative for dizziness and headaches.  Psychiatric/Behavioral:  Negative for agitation and dysphoric mood.        Objective:     BP 128/72   Pulse 81   Temp 98 F (36.7 C)   Resp 16   Ht 5\' 1"  (1.549 m)   Wt 113 lb (51.3 kg)   SpO2 98%   BMI 21.35 kg/m  Wt Readings from Last 3 Encounters:  01/09/23 113 lb (51.3 kg)  09/06/22 113 lb 9.6 oz (51.5 kg)  08/20/22 115 lb (52.2 kg)    Physical Exam Vitals reviewed.  Constitutional:      General: She is not in acute distress.    Appearance: Normal appearance. She is well-developed.  HENT:     Head: Normocephalic and atraumatic.     Right Ear: External ear normal.     Left Ear: External ear normal.     Mouth/Throat:     Pharynx: No oropharyngeal exudate or posterior oropharyngeal erythema.  Eyes:     General: No scleral icterus.       Right eye: No discharge.        Left eye: No discharge.     Conjunctiva/sclera:  Conjunctivae normal.  Neck:     Thyroid: No thyromegaly.  Cardiovascular:     Rate and Rhythm: Normal rate and regular rhythm.  Pulmonary:     Effort: No tachypnea, accessory muscle usage or respiratory distress.     Breath sounds: Normal breath sounds. No decreased breath sounds or wheezing.  Chest:  Breasts:    Right: No inverted nipple, mass, nipple discharge or tenderness (no axillary adenopathy).     Left: No inverted nipple, mass, nipple discharge or tenderness (no axilarry adenopathy).  Abdominal:     General: Bowel sounds are normal.     Palpations: Abdomen is soft.  Tenderness: There is no abdominal tenderness.  Musculoskeletal:        General: No swelling or tenderness.     Cervical back: Neck supple.  Lymphadenopathy:     Cervical: No cervical adenopathy.  Skin:    Findings: No erythema or rash.  Neurological:     Mental Status: She is alert and oriented to person, place, and time.  Psychiatric:        Mood and Affect: Mood normal.        Behavior: Behavior normal.      Outpatient Encounter Medications as of 01/09/2023  Medication Sig   aspirin EC 81 MG tablet Take 81 mg by mouth daily as needed.   calcium citrate-vitamin D (CITRACAL+D) 315-200 MG-UNIT per tablet Take 1 tablet by mouth 2 (two) times daily.   donepezil (ARICEPT) 5 MG tablet Take 1 tablet by mouth at bedtime.   losartan-hydrochlorothiazide (HYZAAR) 50-12.5 MG tablet TAKE 1 TABLET BY MOUTH DAILY   lovastatin (MEVACOR) 10 MG tablet TAKE 1 TABLET(10 MG) BY MOUTH AT BEDTIME   Multiple Vitamin (MULTI-VITAMINS) TABS Take 1 tablet by mouth daily.    Facility-Administered Encounter Medications as of 01/09/2023  Medication   sodium chloride flush (NS) 0.9 % injection 10 mL     Lab Results  Component Value Date   WBC 6.9 08/20/2022   HGB 12.8 08/20/2022   HCT 38.7 08/20/2022   PLT 293 08/20/2022   GLUCOSE 138 (H) 01/04/2023   CHOL 108 01/04/2023   TRIG 72.0 01/04/2023   HDL 36.30 (L)  01/04/2023   LDLCALC 57 01/04/2023   ALT 22 01/04/2023   AST 23 01/04/2023   NA 142 01/04/2023   K 3.9 01/04/2023   CL 99 01/04/2023   CREATININE 0.93 01/04/2023   BUN 25 (H) 01/04/2023   CO2 36 (H) 01/04/2023   TSH 3.62 01/01/2022   INR 1.1 10/21/2012   HGBA1C 5.8 01/04/2023   MICROALBUR 4.0 (H) 01/04/2022    MR BRAIN WO CONTRAST  Result Date: 09/13/2022 CLINICAL DATA:  Mild cognitive impairment.  History of colon cancer. EXAM: MRI HEAD WITHOUT CONTRAST TECHNIQUE: Multiplanar, multiecho pulse sequences of the brain and surrounding structures were obtained without intravenous contrast. COMPARISON:  None Available. FINDINGS: Brain: There is no acute intracranial hemorrhage, extra-axial fluid collection, or acute infarct There is overall mild for age background parenchymal volume loss with prominence of the ventricular system and extra-axial CSF spaces. There is no discernible lobar predilection or disproportionate hippocampal atrophy. Scattered foci of FLAIR signal abnormality throughout the supratentorial white matter and pons likely reflect sequela of mild-to-moderate chronic small-vessel ischemic change chronic small-vessel ischemic change. Pituitary and suprasellar region are normal. There is no mass lesion. There is no mass effect or midline shift. Vascular: Normal flow voids. Skull and upper cervical spine: Normal marrow signal. Sinuses/Orbits: There is complete opacification of the right sphenoid sinus with probable inspissated secretions and/or fungal colonization, and moderate mucosal thickening elsewhere with layering fluid in the bilateral maxillary sinuses. The globes and orbits are unremarkable. Other: There is a small right mastoid effusion. The imaged nasopharynx is unremarkable. IMPRESSION: 1. No acute finding. 2. Mild parenchymal volume loss without discernible lobar predilection or disproportionate hippocampal atrophy, and mild-to-moderate underlying chronic small-vessel ischemic  change. 3. Chronic right sphenoid sinusitis with probable inspissated secretions and/or fungal colonization, and layering fluid in the maxillary sinuses which may reflect acute sinusitis in the correct clinical setting. 4. Small right mastoid effusion. Electronically Signed   By: Lesia Hausen  M.D.   On: 09/13/2022 13:10       Assessment & Plan:  Visit for screening mammogram -     3D Screening Mammogram, Left and Right; Future  Need for influenza vaccination -     Flu Vaccine Trivalent High Dose (Fluad)  Hypertension, essential Assessment & Plan: Continue losartan/hctz.  Blood pressure as outlined.  Follow pressures.  Follow metabolic panel.    Amputated toe of left foot (HCC) Assessment & Plan: S/p amputation - toe.  Her toe is pushing on her adjacent toe.  Saw Dr Ether Griffins.  Recommended to follow and no further intervention at this time. Stable.     Bilateral carotid artery stenosis Assessment & Plan:  Saw [facility] 07/2022 - Carotid Duplex shows RICA1-39%and LICA 40-59% stenosis. Recommended continuing antiplatelet therapy.  F/u 12 months.   Colostomy status (HCC) Assessment & Plan: Ostomy working well.  Follow.    History of rectal cancer Assessment & Plan: Saw oncology 08/20/22- colonoscopy in February 2022 was reported as normal with no evidence of progression or recurrent disease.  Recommendation was repeat in 5 years. CT scan showed no sign of disease recurrence. Follow up in 1 year or sooner as needed.    Hypercholesterolemia Assessment & Plan: On lovastatin.  Low cholesterol diet and exercise.  Follow lipid panel and liver function tests.   Lab Results  Component Value Date   CHOL 108 01/04/2023   HDL 36.30 (L) 01/04/2023   LDLCALC 57 01/04/2023   TRIG 72.0 01/04/2023   CHOLHDL 3 01/04/2023     Hyperglycemia Assessment & Plan: Low carb diet and exercise.  Follow met b and a1c.   Lab Results  Component Value Date   HGBA1C 5.8 01/04/2023     Memory  change Assessment & Plan: Saw neurology for cognitive impairment.  MRI 09/10/22 - No acute finding. Mild parenchymal volume loss without discernible lobar  predilection or disproportionate hippocampal atrophy, and  mild-to-moderate underlying chronic small-vessel ischemic change. Chronic right sphenoid sinusitis with probable inspissated  secretions and/or fungal colonization, and layering fluid in the  maxillary sinuses which may reflect acute sinusitis in the correct clinical setting. Small right mastoid effusion. Recommended to continue aricept and ENT evaluation. Her sons feel things are relatively stable. Continue aricept.    Subclavian arterial stenosis (HCC) Assessment & Plan: Followed by [facility].  Stable.  Evaluated 7/24 - stable.  Recommended f/u in one year.    Maxillary sinusitis, unspecified chronicity Assessment & Plan: Chronic right sphenoid sinusitis with probable inspissated  secretions and/or fungal colonization, and layering fluid in the  maxillary sinuses which may reflect acute sinusitis in the correct clinical setting. Small right mastoid effusion. Recommended ENT evaluation. Need to confirm ENT appt scheduled.       Dale Hackberry, MD

## 2023-01-15 ENCOUNTER — Ambulatory Visit
Admission: RE | Admit: 2023-01-15 | Discharge: 2023-01-15 | Disposition: A | Discharge status: Home / Self Care | Payer: Medicare Other | Source: Ambulatory Visit | Attending: Internal Medicine | Admitting: Internal Medicine

## 2023-01-15 DIAGNOSIS — Z1231 Encounter for screening mammogram for malignant neoplasm of breast: Secondary | ICD-10-CM | POA: Diagnosis not present

## 2023-01-20 ENCOUNTER — Telehealth: Payer: Self-pay | Admitting: Internal Medicine

## 2023-01-20 ENCOUNTER — Encounter: Payer: Self-pay | Admitting: Internal Medicine

## 2023-01-20 NOTE — Telephone Encounter (Signed)
Opened in error

## 2023-01-20 NOTE — Assessment & Plan Note (Signed)
Chronic right sphenoid sinusitis with probable inspissated  secretions and/or fungal colonization, and layering fluid in the  maxillary sinuses which may reflect acute sinusitis in the correct clinical setting. Small right mastoid effusion. Recommended ENT evaluation. Need to confirm ENT appt scheduled.

## 2023-01-20 NOTE — Assessment & Plan Note (Signed)
Saw oncology 08/20/22- colonoscopy in February 2022 was reported as normal with no evidence of progression or recurrent disease.  Recommendation was repeat in 5 years. CT scan showed no sign of disease recurrence. Follow up in 1 year or sooner as needed.

## 2023-01-20 NOTE — Telephone Encounter (Signed)
Please notify Christina Mathis son, that I reviewed the MRI and Dr Margaretmary Eddy recommendations. He did recommend the aricept - which she is doing. He also recommended referral to ENT for evaluation of some changes seen on MRI in the sinuses.  See if appt has been scheduled.  If not, see if agreeable to ENT referral.  [facility])

## 2023-01-20 NOTE — Assessment & Plan Note (Signed)
Low carb diet and exercise.  Follow met b and a1c.   Lab Results  Component Value Date   HGBA1C 5.8 01/04/2023

## 2023-01-20 NOTE — Assessment & Plan Note (Signed)
Continue losartan/hctz.  Blood pressure as outlined.  Follow pressures.  Follow metabolic panel.  

## 2023-01-20 NOTE — Assessment & Plan Note (Signed)
Ostomy working well.  Follow.  

## 2023-01-20 NOTE — Assessment & Plan Note (Signed)
Saw [facility] 07/2022 - Carotid Duplex shows RICA1-39%and LICA 40-59% stenosis. Recommended continuing antiplatelet therapy.  F/u 12 months.

## 2023-01-20 NOTE — Assessment & Plan Note (Signed)
Followed by [facility].  Stable.  Evaluated 7/24 - stable.  Recommended f/u in one year.

## 2023-01-20 NOTE — Assessment & Plan Note (Signed)
S/p amputation - toe.  Her toe is pushing on her adjacent toe.  Saw Dr Ether Griffins.  Recommended to follow and no further intervention at this time. Stable.

## 2023-01-20 NOTE — Assessment & Plan Note (Signed)
On lovastatin.  Low cholesterol diet and exercise.  Follow lipid panel and liver function tests.   Lab Results  Component Value Date   CHOL 108 01/04/2023   HDL 36.30 (L) 01/04/2023   LDLCALC 57 01/04/2023   TRIG 72.0 01/04/2023   CHOLHDL 3 01/04/2023

## 2023-01-20 NOTE — Assessment & Plan Note (Signed)
Saw neurology for cognitive impairment.  MRI 09/10/22 - No acute finding. Mild parenchymal volume loss without discernible lobar  predilection or disproportionate hippocampal atrophy, and  mild-to-moderate underlying chronic small-vessel ischemic change. Chronic right sphenoid sinusitis with probable inspissated  secretions and/or fungal colonization, and layering fluid in the  maxillary sinuses which may reflect acute sinusitis in the correct clinical setting. Small right mastoid effusion. Recommended to continue aricept and ENT evaluation. Her sons feel things are relatively stable. Continue aricept.

## 2023-01-21 NOTE — Telephone Encounter (Signed)
FYI Spoke with patients son. They did not see ENT but pt was recovering from a sinus infection when she had the MRI done so they chose not to proceed with ENT referral.

## 2023-02-11 DIAGNOSIS — K080 Exfoliation of teeth due to systemic causes: Secondary | ICD-10-CM | POA: Diagnosis not present

## 2023-02-13 ENCOUNTER — Ambulatory Visit: Payer: Medicare Other | Admitting: Family Medicine

## 2023-02-15 ENCOUNTER — Other Ambulatory Visit: Payer: Self-pay | Admitting: Internal Medicine

## 2023-02-19 DIAGNOSIS — K080 Exfoliation of teeth due to systemic causes: Secondary | ICD-10-CM | POA: Diagnosis not present

## 2023-03-27 ENCOUNTER — Other Ambulatory Visit: Payer: Self-pay | Admitting: Internal Medicine

## 2023-04-10 ENCOUNTER — Other Ambulatory Visit: Payer: Self-pay

## 2023-04-10 DIAGNOSIS — R739 Hyperglycemia, unspecified: Secondary | ICD-10-CM

## 2023-04-10 DIAGNOSIS — E7800 Pure hypercholesterolemia, unspecified: Secondary | ICD-10-CM

## 2023-04-12 ENCOUNTER — Other Ambulatory Visit (INDEPENDENT_AMBULATORY_CARE_PROVIDER_SITE_OTHER): Payer: Medicare Other

## 2023-04-12 ENCOUNTER — Telehealth: Payer: Self-pay

## 2023-04-12 DIAGNOSIS — R739 Hyperglycemia, unspecified: Secondary | ICD-10-CM

## 2023-04-12 DIAGNOSIS — E7800 Pure hypercholesterolemia, unspecified: Secondary | ICD-10-CM | POA: Diagnosis not present

## 2023-04-12 LAB — BASIC METABOLIC PANEL
BUN: 25 mg/dL — ABNORMAL HIGH (ref 6–23)
CO2: 31 meq/L (ref 19–32)
Calcium: 9.3 mg/dL (ref 8.4–10.5)
Chloride: 103 meq/L (ref 96–112)
Creatinine, Ser: 0.82 mg/dL (ref 0.40–1.20)
GFR: 65.16 mL/min (ref >60.00)
Glucose, Bld: 117 mg/dL — ABNORMAL HIGH (ref 70–99)
Potassium: 4.2 meq/L (ref 3.5–5.1)
Sodium: 142 meq/L (ref 135–145)

## 2023-04-12 LAB — HEPATIC FUNCTION PANEL
ALT: 23 U/L (ref 0–35)
AST: 25 U/L (ref 0–37)
Albumin: 4 g/dL (ref 3.5–5.2)
Alkaline Phosphatase: 43 U/L (ref 39–117)
Bilirubin, Direct: 0.2 mg/dL (ref 0.0–0.3)
Total Bilirubin: 0.8 mg/dL (ref 0.2–1.2)
Total Protein: 6.6 g/dL (ref 6.0–8.3)

## 2023-04-12 LAB — LIPID PANEL
Cholesterol: 104 mg/dL (ref 0–200)
HDL: 41.7 mg/dL (ref >39.00)
LDL Cholesterol: 48 mg/dL (ref 0–99)
NonHDL: 61.99
Total CHOL/HDL Ratio: 2
Triglycerides: 72 mg/dL (ref 0.0–149.0)
VLDL: 14.4 mg/dL (ref 0.0–40.0)

## 2023-04-12 LAB — HEMOGLOBIN A1C: Hgb A1c MFr Bld: 5.7 % (ref 4.6–6.5)

## 2023-04-12 NOTE — Telephone Encounter (Signed)
 Copied from CRM [id number]. Topic: General - Other >> Apr 12, 2023 12:39 PM Rodman Pickle T wrote: Reason for CRM: patient got a call from the office there was not on patient chart she will like a call back

## 2023-04-15 ENCOUNTER — Ambulatory Visit (INDEPENDENT_AMBULATORY_CARE_PROVIDER_SITE_OTHER): Payer: Medicare Other | Admitting: *Deleted

## 2023-04-15 ENCOUNTER — Telehealth: Payer: Self-pay | Admitting: Internal Medicine

## 2023-04-15 VITALS — Ht 61.0 in | Wt 113.0 lb

## 2023-04-15 DIAGNOSIS — Z0000 Encounter for general adult medical examination without abnormal findings: Secondary | ICD-10-CM

## 2023-04-15 NOTE — Telephone Encounter (Signed)
 Advised patient would need both but need to get at pharmacy. Will discuss more at appt tomorrow.

## 2023-04-15 NOTE — Telephone Encounter (Signed)
 Called patient. Unable to leave message. Nothing needed from our office. I have not called her regarding anything outstanding.

## 2023-04-15 NOTE — Telephone Encounter (Signed)
 Copied from CRM [id number]. Topic: Appointments - Scheduling Inquiry for Clinic >> Apr 15, 2023 10:22 AM Mackie Pai E wrote: Reason for CRM: Patient was calling in to see if she is due for her Shingles vaccination and Tetanus shot. Agent could not see the last time these injections were given. Callback number for patient is [phone] to confirm and schedule, patient also stated it is okay to leave a VM.

## 2023-04-15 NOTE — Patient Instructions (Signed)
 Ms. Obrien , Thank you for taking time to come for your Medicare Wellness Visit. I appreciate your ongoing commitment to your health goals. Please review the following plan we discussed and let me know if I can assist you in the future.   Referrals/Orders/Follow-Ups/Clinician Recommendations: Remember to get your shingles and tetanus vaccines.  This is a list of the screening recommended for you and due dates:  Health Maintenance  Topic Date Due   Zoster (Shingles) Vaccine (1 of 2) Never done   Complete foot exam   08/30/2021   COVID-19 Vaccine (5 - 2024-25 season) 09/30/2022   Eye exam for diabetics  12/14/2022   Yearly kidney health urinalysis for diabetes  01/05/2023   Hemoglobin A1C  10/13/2023   Mammogram  01/15/2024   Yearly kidney function blood test for diabetes  04/11/2024   Medicare Annual Wellness Visit  04/14/2024   Colon Cancer Screening  03/21/2025   Pneumonia Vaccine  Completed   Flu Shot  Completed   DEXA scan (bone density measurement)  Completed   HPV Vaccine  Aged Out   DTaP/Tdap/Td vaccine  Discontinued    Advanced directives: (Declined) Advance directive discussed with you today. Even though you declined this today, please call our office should you change your mind, and we can give you the proper paperwork for you to fill out.  Next Medicare Annual Wellness Visit scheduled for next year: Yes 04/20/24 @ 9:30

## 2023-04-15 NOTE — Progress Notes (Signed)
 Subjective:   Annebelle Bostic is a 88 y.o. who presents for a Medicare Wellness preventive visit.  Visit Complete: Virtual I connected with  Chrys Racer on 04/15/23 by a audio enabled telemedicine application and verified that I am speaking with the correct person using two identifiers.  Patient Location: Home  Provider Location: Office/Clinic  I discussed the limitations of evaluation and management by telemedicine. The patient expressed understanding and agreed to proceed.  Vital Signs: Because this visit was a virtual/telehealth visit, some criteria may be missing or patient reported. Any vitals not documented were not able to be obtained and vitals that have been documented are patient reported.  VideoDeclined- This patient declined Librarian, academic. Therefore the visit was completed with audio only.  Persons Participating in Visit: Patient.  AWV Questionnaire: Yes: Patient Medicare AWV questionnaire was completed by the patient on 04/11/23; I have confirmed that all information answered by patient is correct and no changes since this date.  Cardiac Risk Factors include: advanced age (>58men, >27 women);dyslipidemia;hypertension     Objective:    Today's Vitals   04/15/23 0933  Weight: 113 lb (51.3 kg)  Height: 5\' 1"  (1.549 m)   Body mass index is 21.35 kg/m.     04/15/2023    9:43 AM 08/20/2022   10:04 AM 04/12/2022    4:19 PM 08/18/2021   11:06 AM 03/21/2021    8:43 AM 03/21/2020    7:24 AM 03/18/2020    8:48 AM  Advanced Directives  Does Patient Have a Medical Advance Directive? No Yes Yes Yes Yes Yes Yes  Type of Surveyor, minerals;Living will Healthcare Power of [place];Living will Healthcare Power of [place];Living will Living will Healthcare Power of [place];Living will  Does patient want to make changes to medical advance directive?   No - Patient declined  No - Patient declined  No - Patient declined   Copy of Healthcare Power of Attorney in Chart?   No - copy requested  No - copy requested  No - copy requested  Would patient like information on creating a medical advance directive? No - Patient declined          Current Medications (verified) Outpatient Encounter Medications as of 04/15/2023  Medication Sig   aspirin EC 81 MG tablet Take 81 mg by mouth daily as needed.   calcium citrate-vitamin D (CITRACAL+D) 315-200 MG-UNIT per tablet Take 1 tablet by mouth 2 (two) times daily.   donepezil (ARICEPT) 5 MG tablet Take 1 tablet by mouth at bedtime.   losartan-hydrochlorothiazide (HYZAAR) 50-12.5 MG tablet TAKE 1 TABLET BY MOUTH DAILY   lovastatin (MEVACOR) 10 MG tablet TAKE 1 TABLET(10 MG) BY MOUTH AT BEDTIME   Multiple Vitamin (MULTI-VITAMINS) TABS Take 1 tablet by mouth daily.    Facility-Administered Encounter Medications as of 04/15/2023  Medication   sodium chloride flush (NS) 0.9 % injection 10 mL    Allergies (verified) Patient has no known allergies.   History: Past Medical History:  Diagnosis Date   Colon cancer (HCC)    History of shingles    twice   Hypercholesteremia    Hypertension    Left carotid bruit    Nephrolithiasis    Neuropathy    Personal history of chemotherapy 2014   Colon   Personal history of radiation therapy 2014   Colon   Urine incontinence 03/06/2013   h/o   Past Surgical History:  Procedure Laterality Date   APPENDECTOMY  1979   colorectal   CESAREAN SECTION     COLONOSCOPY WITH PROPOFOL N/A 03/21/2015   Procedure: COLONOSCOPY WITH PROPOFOL;  Surgeon: Wallace Cullens, MD;  Location: [facility] ENDOSCOPY;  Service: Gastroenterology;  Laterality: N/A;   COLONOSCOPY WITH PROPOFOL N/A 03/21/2020   Procedure: COLONOSCOPY WITH PROPOFOL;  Surgeon: Regis Bill, MD;  Location: [facility] ENDOSCOPY;  Service: Endoscopy;  Laterality: N/A;   COLOSTOMY     surgery for colorectal cancer     VENTRAL HERNIA REPAIR Right [phone]   Family History  Problem  Relation Age of Onset   Hypertension Mother    Hypertension Father    Breast cancer Paternal Aunt    Social History   Socioeconomic History   Marital status: Widowed    Spouse name: Not on file   Number of children: Not on file   Years of education: Not on file   Highest education level: Bachelor's degree (e.g., BA, AB, BS)  Occupational History   Not on file  Tobacco Use   Smoking status: Never   Smokeless tobacco: Never  Vaping Use   Vaping status: Never Used  Substance and Sexual Activity   Alcohol use: No    Alcohol/week: 0.0 standard drinks of alcohol   Drug use: No   Sexual activity: Not Currently  Other Topics Concern   Not on file  Social History Narrative   Not on file   Social Drivers of Health   Financial Resource Strain: Low Risk  (04/11/2023)   Overall Financial Resource Strain (CARDIA)    Difficulty of Paying Living Expenses: Not hard at all  Food Insecurity: No Food Insecurity (04/11/2023)   Hunger Vital Sign    Worried About Running Out of Food in the Last Year: Never true    Ran Out of Food in the Last Year: Never true  Transportation Needs: No Transportation Needs (04/11/2023)   PRAPARE - Administrator, Civil Service (Medical): No    Lack of Transportation (Non-Medical): No  Physical Activity: Sufficiently Active (04/11/2023)   Exercise Vital Sign    Days of Exercise per Week: 7 days    Minutes of Exercise per Session: 30 min  Stress: No Stress Concern Present (04/11/2023)   [employer] of Occupational Health - Occupational Stress Questionnaire    Feeling of Stress : Not at all  Social Connections: Moderately Integrated (04/11/2023)   Social Connection and Isolation Panel [NHANES]    Frequency of Communication with Friends and Family: More than three times a week    Frequency of Social Gatherings with Friends and Family: Twice a week    Attends Religious Services: More than 4 times per year    Active Member of [employer] or  Organizations: Yes    Attends Banker Meetings: More than 4 times per year    Marital Status: Widowed    Tobacco Counseling Counseling given: Not Answered    Clinical Intake:  Pre-visit preparation completed: Yes  Pain : No/denies pain     BMI - recorded: 21.35 Nutritional Status: BMI of 19-24  Normal Nutritional Risks: None Diabetes: No  How often do you need to have someone help you when you read instructions, pamphlets, or other written materials from your doctor or pharmacy?: 1 - Never  Interpreter Needed?: No  Information entered by :: R. Aila Terra LPN   Activities of Daily Living     04/11/2023    2:53 PM  In your present state of health, do you have  any difficulty performing the following activities:  Hearing? 0   Vision? 0   Difficulty concentrating or making decisions? 0   Walking or climbing stairs? 0   Dressing or bathing? 0   Doing errands, shopping? 0   Preparing Food and eating ? N   Using the Toilet? N   In the past six months, have you accidently leaked urine? Y   Do you have problems with loss of bowel control? N   Managing your Medications? N   Managing your Finances? N   Housekeeping or managing your Housekeeping? N      Proxy-reported    Patient Care Team: Dale Linneus, MD as PCP - General (Internal Medicine) Regal, Kirstie Peri, DPM as Consulting Physician (Podiatry) Orlie Dakin Tollie Pizza, MD as Consulting Physician (Oncology) Stanton Kidney, MD as Consulting Physician (Gastroenterology)  Indicate any recent Medical Services you may have received from other than [facility] providers in the past year (date may be approximate).     Assessment:   This is a routine wellness examination for Any.  Hearing/Vision screen Hearing Screening - Comments:: No issues Vision Screening - Comments:: glasses   Goals Addressed             This Visit's Progress    Patient Stated       Wants to continue to walk on a regular basis        Depression Screen     04/15/2023    9:39 AM 04/12/2022    4:16 PM 01/04/2022    9:29 AM 03/21/2021    8:36 AM 01/02/2021    8:30 AM 08/31/2020    8:53 AM 04/15/2020    9:12 AM  PHQ 2/9 Scores  PHQ - 2 Score 0 0 0 0 0 0 0  PHQ- 9 Score 0          Fall Risk     04/11/2023    2:53 PM 04/12/2022    4:14 PM 01/04/2022    9:29 AM 03/21/2021    8:39 AM 08/31/2020    8:53 AM  Fall Risk   Falls in the past year? 0  0 0 0 1  Number falls in past yr: 0  0 0 0  Injury with Fall? 0   0  0  Risk for fall due to : No Fall Risks  Impaired mobility    Follow up Falls prevention discussed;Falls evaluation completed Falls evaluation completed;Falls prevention discussed Falls evaluation completed Falls evaluation completed Falls evaluation completed     Proxy-reported    MEDICARE RISK AT HOME:  Medicare Risk at Home Any stairs in or around the home?: (Proxy-Rptd) Yes If so, are there any without handrails?: (Proxy-Rptd) No Home free of loose throw rugs in walkways, pet beds, electrical cords, etc?: (Proxy-Rptd) Yes Adequate lighting in your home to reduce risk of falls?: (Proxy-Rptd) Yes Life alert?: (Proxy-Rptd) No Use of a cane, walker or w/c?: (Proxy-Rptd) No Grab bars in the bathroom?: (Proxy-Rptd) Yes Shower chair or bench in shower?: (Proxy-Rptd) No Elevated toilet seat or a handicapped toilet?: (Proxy-Rptd) No  TIMED UP AND GO:  Was the test performed?  No  Cognitive Function: 6CIT completed    03/08/2017    9:34 AM  MMSE - Mini Mental State Exam  Orientation to time 5  Orientation to Place 5  Registration 3  Attention/ Calculation 5  Recall 1  Language- name 2 objects 2  Language- repeat 1  Language- follow 3 step command 3  Language- read & follow direction 1  Write a sentence 1  Copy design 1  Total score 28        04/15/2023    9:46 AM 04/12/2022    4:19 PM 03/18/2020    8:55 AM 03/18/2019    8:54 AM 03/14/2018    8:52 AM  6CIT Screen  What Year? 0 points 0 points 0  points 0 points 0 points  What month? 0 points 0 points 0 points 0 points 0 points  What time? 0 points 0 points 0 points 0 points 0 points  Count back from 20 0 points 0 points 0 points 0 points 0 points  Months in reverse 0 points 0 points 0 points 0 points 0 points  Repeat phrase 0 points 0 points 0 points 0 points 0 points  Total Score 0 points 0 points 0 points 0 points 0 points    Immunizations Immunization History  Administered Date(s) Administered   Fluad Quad(high Dose 65+) 11/28/2018, 01/02/2021   Fluad Trivalent(High Dose 65+) 01/09/2023   Influenza, High Dose Seasonal PF 10/18/2015, 10/22/2016, 11/01/2017   Influenza,inj,Quad PF,6+ Mos 10/12/2014   Influenza-Unspecified 11/30/2013   PFIZER(Purple Top)SARS-COV-2 Vaccination 02/20/2019, 03/10/2019, 01/01/2020, 11/09/2020   Pneumococcal Conjugate-13 10/22/2016   Pneumococcal Polysaccharide-23 01/30/2004    Screening Tests Health Maintenance  Topic Date Due   Zoster Vaccines- Shingrix (1 of 2) Never done   FOOT EXAM  08/30/2021   COVID-19 Vaccine (5 - 2024-25 season) 09/30/2022   OPHTHALMOLOGY EXAM  12/14/2022   Diabetic kidney evaluation - Urine ACR  01/05/2023   HEMOGLOBIN A1C  10/13/2023   MAMMOGRAM  01/15/2024   Diabetic kidney evaluation - eGFR measurement  04/11/2024   Medicare Annual Wellness (AWV)  04/14/2024   Colonoscopy  03/21/2025   Pneumonia Vaccine 78+ Years old  Completed   INFLUENZA VACCINE  Completed   DEXA SCAN  Completed   HPV VACCINES  Aged Out   DTaP/Tdap/Td  Discontinued    Health Maintenance  Health Maintenance Due  Topic Date Due   Zoster Vaccines- Shingrix (1 of 2) Never done   FOOT EXAM  08/30/2021   COVID-19 Vaccine (5 - 2024-25 season) 09/30/2022   OPHTHALMOLOGY EXAM  12/14/2022   Diabetic kidney evaluation - Urine ACR  01/05/2023   Health Maintenance Items Addressed: Discussed with patient the need for tetanus and shingles vaccines.   Additional Screening:  Vision  Screening: Recommended annual ophthalmology exams for early detection of glaucoma and other disorders of the eye. Up to date [facility]  Dental Screening: Recommended annual dental exams for proper oral hygiene  Community Resource Referral / Chronic Care Management: CRR required this visit?  No   CCM required this visit?  No     Plan:     I have personally reviewed and noted the following in the patient's chart:   Medical and social history Use of alcohol, tobacco or illicit drugs  Current medications and supplements including opioid prescriptions. Patient is not currently taking opioid prescriptions. Functional ability and status Nutritional status Physical activity Advanced directives List of other physicians Hospitalizations, surgeries, and ER visits in previous 12 months Vitals Screenings to include cognitive, depression, and falls Referrals and appointments  In addition, I have reviewed and discussed with patient certain preventive protocols, quality metrics, and best practice recommendations. A written personalized care plan for preventive services as well as general preventive health recommendations were provided to patient.     Sydell Axon, [place]   [id number]  After Visit Summary: (Pick Up) Due to this being a telephonic visit, with patients personalized plan was offered to patient and patient has requested to Pick up at office.  Notes: Please refer to Routing Comments.

## 2023-04-15 NOTE — Progress Notes (Signed)
 Reviewed. Has a history of hyperglycemia. No documented history of diabetes. Can this be corrected. See Regina's note.

## 2023-04-16 ENCOUNTER — Ambulatory Visit: Payer: Medicare Other | Admitting: Internal Medicine

## 2023-04-16 VITALS — BP 136/72 | HR 81 | Temp 98.0°F | Resp 16 | Ht 61.0 in | Wt 110.0 lb

## 2023-04-16 DIAGNOSIS — Z933 Colostomy status: Secondary | ICD-10-CM

## 2023-04-16 DIAGNOSIS — R739 Hyperglycemia, unspecified: Secondary | ICD-10-CM

## 2023-04-16 DIAGNOSIS — I10 Essential (primary) hypertension: Secondary | ICD-10-CM

## 2023-04-16 DIAGNOSIS — S98132A Complete traumatic amputation of one left lesser toe, initial encounter: Secondary | ICD-10-CM | POA: Diagnosis not present

## 2023-04-16 DIAGNOSIS — R413 Other amnesia: Secondary | ICD-10-CM

## 2023-04-16 DIAGNOSIS — R410 Disorientation, unspecified: Secondary | ICD-10-CM | POA: Diagnosis not present

## 2023-04-16 DIAGNOSIS — I6523 Occlusion and stenosis of bilateral carotid arteries: Secondary | ICD-10-CM

## 2023-04-16 DIAGNOSIS — E7800 Pure hypercholesterolemia, unspecified: Secondary | ICD-10-CM

## 2023-04-16 LAB — URINALYSIS, ROUTINE W REFLEX MICROSCOPIC
Bilirubin Urine: NEGATIVE
Ketones, ur: NEGATIVE
Leukocytes,Ua: NEGATIVE
Nitrite: NEGATIVE
Specific Gravity, Urine: 1.01 (ref 1.000–1.030)
Total Protein, Urine: NEGATIVE
Urine Glucose: NEGATIVE
Urobilinogen, UA: 0.2 (ref 0.0–1.0)
pH: 6 (ref 5.0–8.0)

## 2023-04-16 NOTE — Progress Notes (Signed)
 Subjective:    [name], female    DOB: [birth year], 88 y.o.   MRN: [id number]  Patient here for  Chief Complaint  Patient presents with   Medical Management of Chronic Issues    HPI Here for a scheduled follow up regarding hypercholesterolemia and hypertension. Followed by oncology - history of rectal cancer. Last colonoscopy in February 2022 was reported as normal with no evidence of progression or recurrent disease. Plan is to repeat in 2027. Saw [facility] 07/2022 - Carotid Duplex shows RICA1-39%and LICA 40-59% stenosis. Recommended continuing antiplatelet therapy.  F/u 12 months. Saw neurology for cognitive impairment.  MRI 09/10/22 - No acute finding. Mild parenchymal volume loss without discernible lobar  predilection or disproportionate hippocampal atrophy, and  mild-to-moderate underlying chronic small-vessel ischemic change. Chronic right sphenoid sinusitis with probable inspissated  secretions and/or fungal colonization, and layering fluid in the  maxillary sinuses which may reflect acute sinusitis in the correct clinical setting. Small right mastoid effusion. Recommended to continue aricept and ENT evaluation. She comes in today accompanied by her two sons. History obtained from all of them. Has noticed recently change in her mental orientation. Approximately one month ago, was speaking with family member and talking about leaving children at play school. Son realized she had not been taking her medication. Off aricept for at least 10 days. Has also had confusion regarding one of her sons - did not know married or where he was, etc. She is eating. No nausea or vomiting. No sob. Denies abdominal pain. Christina Mathis reports that she wears her pads too long. Has wet the bed. Contacted neurology regarding the above and they requested to have urine checked to confirm no infection.    Past Medical History:  Diagnosis Date   Colon cancer [facility])    History of shingles    twice    Hypercholesteremia    Hypertension    Left carotid bruit    Nephrolithiasis    Neuropathy    Personal history of chemotherapy 2014   Colon   Personal history of radiation therapy 2014   Colon   Urine incontinence 03/06/2013   h/o   Past Surgical History:  Procedure Laterality Date   APPENDECTOMY  1979   colorectal   CESAREAN SECTION     COLONOSCOPY WITH PROPOFOL N/A 03/21/2015   Procedure: COLONOSCOPY WITH PROPOFOL;  Surgeon: Wallace Cullens, MD;  Location: [facility] ENDOSCOPY;  Service: Gastroenterology;  Laterality: N/A;   COLONOSCOPY WITH PROPOFOL N/A 03/21/2020   Procedure: COLONOSCOPY WITH PROPOFOL;  Surgeon: Regis Bill, MD;  Location: [facility] ENDOSCOPY;  Service: Endoscopy;  Laterality: N/A;   COLOSTOMY     surgery for colorectal cancer     VENTRAL HERNIA REPAIR Right [phone]   Family History  Problem Relation Age of Onset   Hypertension Mother    Hypertension Father    Breast cancer Paternal Aunt    Social History   Socioeconomic History   Marital status: Widowed    Spouse name: Not on file   Number of children: Not on file   Years of education: Not on file   Highest education level: Bachelor's degree (e.g., BA, AB, BS)  Occupational History   Not on file  Tobacco Use   Smoking status: Never   Smokeless tobacco: Never  Vaping Use   Vaping status: Never Used  Substance and Sexual Activity   Alcohol use: No    Alcohol/week: 0.0 standard drinks of alcohol   Drug use: No  Sexual activity: Not Currently  Other Topics Concern   Not on file  Social History Narrative   Not on file   Social Drivers of Health   Financial Resource Strain: Low Risk  (04/11/2023)   Overall Financial Resource Strain (CARDIA)    Difficulty of Paying Living Expenses: Not hard at all  Food Insecurity: No Food Insecurity (04/11/2023)   Hunger Vital Sign    Worried About Running Out of Food in the Last Year: Never true    Ran Out of Food in the Last Year: Never true  Transportation  Needs: No Transportation Needs (04/11/2023)   PRAPARE - Administrator, Civil Service (Medical): No    Lack of Transportation (Non-Medical): No  Physical Activity: Sufficiently Active (04/11/2023)   Exercise Vital Sign    Days of Exercise per Week: 7 days    Minutes of Exercise per Session: 30 min  Stress: No Stress Concern Present (04/11/2023)   [employer] of Occupational Health - Occupational Stress Questionnaire    Feeling of Stress : Not at all  Social Connections: Moderately Integrated (04/11/2023)   Social Connection and Isolation Panel [NHANES]    Frequency of Communication with Friends and Family: More than three times a week    Frequency of Social Gatherings with Friends and Family: Twice a week    Attends Religious Services: More than 4 times per year    Active Member of [employer] or Organizations: Yes    Attends Banker Meetings: More than 4 times per year    Marital Status: Widowed     Review of Systems  Constitutional:  Negative for appetite change and fever.  HENT:  Negative for congestion and sinus pressure.   Respiratory:  Negative for cough, chest tightness and shortness of breath.   Cardiovascular:  Negative for chest pain, palpitations and leg swelling.  Gastrointestinal:  Negative for abdominal pain, diarrhea, nausea and vomiting.  Genitourinary:  Negative for difficulty urinating and dysuria.  Musculoskeletal:  Negative for joint swelling and myalgias.  Skin:  Negative for color change and rash.  Neurological:  Negative for dizziness and headaches.  Psychiatric/Behavioral:  Negative for agitation and dysphoric mood.        Objective:     BP 136/72   Pulse 81   Temp 98 F (36.7 C)   Resp 16   Ht 5\' 1"  (1.549 m)   Wt 110 lb (49.9 kg)   SpO2 97%   BMI 20.78 kg/m  Wt Readings from Last 3 Encounters:  04/16/23 110 lb (49.9 kg)  04/15/23 113 lb (51.3 kg)  01/09/23 113 lb (51.3 kg)    Physical Exam Vitals reviewed.   Constitutional:      General: She is not in acute distress.    Appearance: Normal appearance.  HENT:     Head: Normocephalic and atraumatic.     Right Ear: External ear normal.     Left Ear: External ear normal.     Mouth/Throat:     Pharynx: No oropharyngeal exudate or posterior oropharyngeal erythema.  Eyes:     General: No scleral icterus.       Right eye: No discharge.        Left eye: No discharge.     Conjunctiva/sclera: Conjunctivae normal.  Neck:     Thyroid: No thyromegaly.  Cardiovascular:     Rate and Rhythm: Normal rate and regular rhythm.  Pulmonary:     Effort: No respiratory distress.  Breath sounds: Normal breath sounds. No wheezing.  Abdominal:     General: Bowel sounds are normal.     Palpations: Abdomen is soft.     Tenderness: There is no abdominal tenderness.  Musculoskeletal:        General: No swelling or tenderness.     Cervical back: Neck supple. No tenderness.  Lymphadenopathy:     Cervical: No cervical adenopathy.  Skin:    Findings: No erythema or rash.  Neurological:     Mental Status: She is alert.  Psychiatric:        Mood and Affect: Mood normal.        Behavior: Behavior normal.         Outpatient Encounter Medications as of 04/16/2023  Medication Sig   aspirin EC 81 MG tablet Take 81 mg by mouth daily as needed.   calcium citrate-vitamin D (CITRACAL+D) 315-200 MG-UNIT per tablet Take 1 tablet by mouth 2 (two) times daily.   donepezil (ARICEPT) 5 MG tablet Take 1 tablet by mouth at bedtime.   losartan-hydrochlorothiazide (HYZAAR) 50-12.5 MG tablet TAKE 1 TABLET BY MOUTH DAILY   lovastatin (MEVACOR) 10 MG tablet TAKE 1 TABLET(10 MG) BY MOUTH AT BEDTIME   Multiple Vitamin (MULTI-VITAMINS) TABS Take 1 tablet by mouth daily.    Facility-Administered Encounter Medications as of 04/16/2023  Medication   sodium chloride flush (NS) 0.9 % injection 10 mL     Lab Results  Component Value Date   WBC 6.9 08/20/2022   HGB 12.8  08/20/2022   HCT 38.7 08/20/2022   PLT 293 08/20/2022   GLUCOSE 117 (H) 04/12/2023   CHOL 104 04/12/2023   TRIG 72.0 04/12/2023   HDL 41.70 04/12/2023   LDLCALC 48 04/12/2023   ALT 23 04/12/2023   AST 25 04/12/2023   NA 142 04/12/2023   K 4.2 04/12/2023   CL 103 04/12/2023   CREATININE 0.82 04/12/2023   BUN 25 (H) 04/12/2023   CO2 31 04/12/2023   TSH 3.62 01/01/2022   INR 1.1 10/21/2012   HGBA1C 5.7 04/12/2023   MICROALBUR 4.0 (H) 01/04/2022    MM 3D SCREENING MAMMOGRAM BILATERAL BREAST Result Date: 01/17/2023 CLINICAL DATA:  Screening. EXAM: DIGITAL SCREENING BILATERAL MAMMOGRAM WITH TOMOSYNTHESIS AND CAD TECHNIQUE: Bilateral screening digital craniocaudal and mediolateral oblique mammograms were obtained. Bilateral screening digital breast tomosynthesis was performed. The images were evaluated with computer-aided detection. COMPARISON:  Previous exam(s). ACR Breast Density Category d: The breasts are extremely dense, which lowers the sensitivity of mammography. FINDINGS: There are no findings suspicious for malignancy. IMPRESSION: No mammographic evidence of malignancy. A result letter of this screening mammogram will be mailed directly to the patient. RECOMMENDATION: Screening mammogram in one year. (Code:SM-B-01Y) BI-RADS CATEGORY  1: Negative. Electronically Signed   By: Edwin Cap M.D.   On: 01/17/2023 08:47       Assessment & Plan:  Confusion -     Urinalysis, Routine w reflex microscopic -     Urine Culture  Amputated toe of left foot (HCC) Assessment & Plan: S/p amputation - toe.  Stable.    Bilateral carotid artery stenosis Assessment & Plan:  Saw [facility] 07/2022 - Carotid Duplex shows RICA1-39%and LICA 40-59% stenosis. Recommended continuing antiplatelet therapy.  F/u 12 months.   Colostomy status (HCC) Assessment & Plan: Ostomy working well. Follow.    Hypercholesterolemia Assessment & Plan: On lovastatin.  Low cholesterol diet and exercise.  Follow  lipid panel and liver function tests.   Lab Results  Component Value Date   CHOL 104 04/12/2023   HDL 41.70 04/12/2023   LDLCALC 48 04/12/2023   TRIG 72.0 04/12/2023   CHOLHDL 2 04/12/2023     Hyperglycemia Assessment & Plan: Low carb diet and exercise.  Follow met b and a1c.   Lab Results  Component Value Date   HGBA1C 5.7 04/12/2023     Hypertension, essential Assessment & Plan: Continue losartan/hctz.  Blood pressure as outlined.  Follow pressures.  Follow metabolic panel.     Memory change Assessment & Plan: Saw neurology for cognitive impairment.  MRI 09/10/22 - No acute finding. Mild parenchymal volume loss without discernible lobar  predilection or disproportionate hippocampal atrophy, and  mild-to-moderate underlying chronic small-vessel ischemic change. Chronic right sphenoid sinusitis with probable inspissated  secretions and/or fungal colonization, and layering fluid in the  maxillary sinuses which may reflect acute sinusitis in the correct clinical setting. Small right mastoid effusion. Recommended to continue aricept and ENT evaluation. She is back on aricept now. Son realized she was not taking as outlined. Given noted changes, will check urine to confirm no infection. Forward results to Dr Sherryll Burger.       Dale [place], MD

## 2023-04-17 ENCOUNTER — Encounter: Payer: Self-pay | Admitting: Internal Medicine

## 2023-04-17 LAB — URINE CULTURE
MICRO NUMBER:: 16215330
Result:: NO GROWTH
SPECIMEN QUALITY:: ADEQUATE

## 2023-04-17 NOTE — Telephone Encounter (Signed)
 Called and left message for Ward (son). Urine is ok. Nothing further at this time. Waiting for urine culture and will let them know as soon as culture available.

## 2023-04-17 NOTE — Telephone Encounter (Signed)
 Already talked to son

## 2023-04-21 ENCOUNTER — Encounter: Payer: Self-pay | Admitting: Internal Medicine

## 2023-04-21 NOTE — Assessment & Plan Note (Signed)
Saw [facility] 07/2022 - Carotid Duplex shows RICA1-39%and LICA 40-59% stenosis. Recommended continuing antiplatelet therapy.  F/u 12 months.

## 2023-04-21 NOTE — Assessment & Plan Note (Signed)
 Saw neurology for cognitive impairment.  MRI 09/10/22 - No acute finding. Mild parenchymal volume loss without discernible lobar  predilection or disproportionate hippocampal atrophy, and  mild-to-moderate underlying chronic small-vessel ischemic change. Chronic right sphenoid sinusitis with probable inspissated  secretions and/or fungal colonization, and layering fluid in the  maxillary sinuses which may reflect acute sinusitis in the correct clinical setting. Small right mastoid effusion. Recommended to continue aricept and ENT evaluation. She is back on aricept now. Son realized she was not taking as outlined. Given noted changes, will check urine to confirm no infection. Forward results to Dr Sherryll Burger.

## 2023-04-21 NOTE — Assessment & Plan Note (Signed)
 On lovastatin.  Low cholesterol diet and exercise.  Follow lipid panel and liver function tests.   Lab Results  Component Value Date   CHOL 104 04/12/2023   HDL 41.70 04/12/2023   LDLCALC 48 04/12/2023   TRIG 72.0 04/12/2023   CHOLHDL 2 04/12/2023

## 2023-04-21 NOTE — Assessment & Plan Note (Signed)
Ostomy working well.  Follow.  

## 2023-04-21 NOTE — Assessment & Plan Note (Signed)
 S/p amputation - toe.  Stable.

## 2023-04-21 NOTE — Assessment & Plan Note (Signed)
Continue losartan/hctz.  Blood pressure as outlined.  Follow pressures.  Follow metabolic panel.  

## 2023-04-21 NOTE — Assessment & Plan Note (Signed)
 Low carb diet and exercise.  Follow met b and a1c.   Lab Results  Component Value Date   HGBA1C 5.7 04/12/2023

## 2023-04-24 DIAGNOSIS — Z9189 Other specified personal risk factors, not elsewhere classified: Secondary | ICD-10-CM | POA: Diagnosis not present

## 2023-04-24 DIAGNOSIS — R410 Disorientation, unspecified: Secondary | ICD-10-CM | POA: Diagnosis not present

## 2023-04-24 DIAGNOSIS — G301 Alzheimer's disease with late onset: Secondary | ICD-10-CM | POA: Diagnosis not present

## 2023-04-24 DIAGNOSIS — Z79899 Other long term (current) drug therapy: Secondary | ICD-10-CM | POA: Diagnosis not present

## 2023-05-02 ENCOUNTER — Telehealth: Payer: Self-pay

## 2023-05-02 NOTE — Telephone Encounter (Signed)
 Copied from CRM [id number]. Topic: General - Other >> May 02, 2023 12:21 PM Florestine Avers wrote: Reason for CRM: Alycia Rossetti called in from [facility] stating that they have a referral to admit the patient to admit the patient for home health services and the patient asked to hold off on the services until 05/06/2023.

## 2023-05-02 NOTE — Telephone Encounter (Signed)
 FYI- home health postponed until 4/7 per pt request.

## 2023-05-06 DIAGNOSIS — F0280 Dementia in other diseases classified elsewhere without behavioral disturbance: Secondary | ICD-10-CM | POA: Diagnosis not present

## 2023-05-06 DIAGNOSIS — G301 Alzheimer's disease with late onset: Secondary | ICD-10-CM | POA: Diagnosis not present

## 2023-05-06 DIAGNOSIS — E7800 Pure hypercholesterolemia, unspecified: Secondary | ICD-10-CM | POA: Diagnosis not present

## 2023-05-06 DIAGNOSIS — Z85048 Personal history of other malignant neoplasm of rectum, rectosigmoid junction, and anus: Secondary | ICD-10-CM | POA: Diagnosis not present

## 2023-05-06 DIAGNOSIS — Z7982 Long term (current) use of aspirin: Secondary | ICD-10-CM | POA: Diagnosis not present

## 2023-05-06 DIAGNOSIS — K5790 Diverticulosis of intestine, part unspecified, without perforation or abscess without bleeding: Secondary | ICD-10-CM | POA: Diagnosis not present

## 2023-05-06 DIAGNOSIS — R32 Unspecified urinary incontinence: Secondary | ICD-10-CM | POA: Diagnosis not present

## 2023-05-06 DIAGNOSIS — I10 Essential (primary) hypertension: Secondary | ICD-10-CM | POA: Diagnosis not present

## 2023-05-08 DIAGNOSIS — Z85048 Personal history of other malignant neoplasm of rectum, rectosigmoid junction, and anus: Secondary | ICD-10-CM | POA: Diagnosis not present

## 2023-05-08 DIAGNOSIS — E7800 Pure hypercholesterolemia, unspecified: Secondary | ICD-10-CM | POA: Diagnosis not present

## 2023-05-08 DIAGNOSIS — F0280 Dementia in other diseases classified elsewhere without behavioral disturbance: Secondary | ICD-10-CM | POA: Diagnosis not present

## 2023-05-08 DIAGNOSIS — G301 Alzheimer's disease with late onset: Secondary | ICD-10-CM | POA: Diagnosis not present

## 2023-05-08 DIAGNOSIS — Z7982 Long term (current) use of aspirin: Secondary | ICD-10-CM | POA: Diagnosis not present

## 2023-05-08 DIAGNOSIS — R32 Unspecified urinary incontinence: Secondary | ICD-10-CM | POA: Diagnosis not present

## 2023-05-08 DIAGNOSIS — I10 Essential (primary) hypertension: Secondary | ICD-10-CM | POA: Diagnosis not present

## 2023-05-08 DIAGNOSIS — K5790 Diverticulosis of intestine, part unspecified, without perforation or abscess without bleeding: Secondary | ICD-10-CM | POA: Diagnosis not present

## 2023-05-09 DIAGNOSIS — Z7982 Long term (current) use of aspirin: Secondary | ICD-10-CM | POA: Diagnosis not present

## 2023-05-09 DIAGNOSIS — F0280 Dementia in other diseases classified elsewhere without behavioral disturbance: Secondary | ICD-10-CM | POA: Diagnosis not present

## 2023-05-09 DIAGNOSIS — K5790 Diverticulosis of intestine, part unspecified, without perforation or abscess without bleeding: Secondary | ICD-10-CM | POA: Diagnosis not present

## 2023-05-09 DIAGNOSIS — G301 Alzheimer's disease with late onset: Secondary | ICD-10-CM | POA: Diagnosis not present

## 2023-05-09 DIAGNOSIS — E7800 Pure hypercholesterolemia, unspecified: Secondary | ICD-10-CM | POA: Diagnosis not present

## 2023-05-09 DIAGNOSIS — I10 Essential (primary) hypertension: Secondary | ICD-10-CM | POA: Diagnosis not present

## 2023-05-09 DIAGNOSIS — R32 Unspecified urinary incontinence: Secondary | ICD-10-CM | POA: Diagnosis not present

## 2023-05-09 DIAGNOSIS — Z85048 Personal history of other malignant neoplasm of rectum, rectosigmoid junction, and anus: Secondary | ICD-10-CM | POA: Diagnosis not present

## 2023-05-14 DIAGNOSIS — F0280 Dementia in other diseases classified elsewhere without behavioral disturbance: Secondary | ICD-10-CM | POA: Diagnosis not present

## 2023-05-14 DIAGNOSIS — Z85048 Personal history of other malignant neoplasm of rectum, rectosigmoid junction, and anus: Secondary | ICD-10-CM | POA: Diagnosis not present

## 2023-05-14 DIAGNOSIS — I10 Essential (primary) hypertension: Secondary | ICD-10-CM | POA: Diagnosis not present

## 2023-05-14 DIAGNOSIS — R32 Unspecified urinary incontinence: Secondary | ICD-10-CM | POA: Diagnosis not present

## 2023-05-14 DIAGNOSIS — G301 Alzheimer's disease with late onset: Secondary | ICD-10-CM | POA: Diagnosis not present

## 2023-05-14 DIAGNOSIS — Z7982 Long term (current) use of aspirin: Secondary | ICD-10-CM | POA: Diagnosis not present

## 2023-05-14 DIAGNOSIS — E7800 Pure hypercholesterolemia, unspecified: Secondary | ICD-10-CM | POA: Diagnosis not present

## 2023-05-14 DIAGNOSIS — K5790 Diverticulosis of intestine, part unspecified, without perforation or abscess without bleeding: Secondary | ICD-10-CM | POA: Diagnosis not present

## 2023-05-20 ENCOUNTER — Telehealth: Payer: Self-pay | Admitting: Internal Medicine

## 2023-05-20 DIAGNOSIS — F0280 Dementia in other diseases classified elsewhere without behavioral disturbance: Secondary | ICD-10-CM | POA: Diagnosis not present

## 2023-05-20 DIAGNOSIS — I10 Essential (primary) hypertension: Secondary | ICD-10-CM | POA: Diagnosis not present

## 2023-05-20 DIAGNOSIS — G301 Alzheimer's disease with late onset: Secondary | ICD-10-CM | POA: Diagnosis not present

## 2023-05-20 DIAGNOSIS — Z85048 Personal history of other malignant neoplasm of rectum, rectosigmoid junction, and anus: Secondary | ICD-10-CM | POA: Diagnosis not present

## 2023-05-20 DIAGNOSIS — K5790 Diverticulosis of intestine, part unspecified, without perforation or abscess without bleeding: Secondary | ICD-10-CM | POA: Diagnosis not present

## 2023-05-20 DIAGNOSIS — E7800 Pure hypercholesterolemia, unspecified: Secondary | ICD-10-CM | POA: Diagnosis not present

## 2023-05-20 DIAGNOSIS — R32 Unspecified urinary incontinence: Secondary | ICD-10-CM | POA: Diagnosis not present

## 2023-05-20 DIAGNOSIS — Z7982 Long term (current) use of aspirin: Secondary | ICD-10-CM | POA: Diagnosis not present

## 2023-05-20 NOTE — Telephone Encounter (Signed)
 Home Health POC paperwork placed in Dr [employer] mail folder to be delivered to [facility]. [facility]

## 2023-05-21 DIAGNOSIS — E7800 Pure hypercholesterolemia, unspecified: Secondary | ICD-10-CM | POA: Diagnosis not present

## 2023-05-21 DIAGNOSIS — F0280 Dementia in other diseases classified elsewhere without behavioral disturbance: Secondary | ICD-10-CM | POA: Diagnosis not present

## 2023-05-21 DIAGNOSIS — Z85048 Personal history of other malignant neoplasm of rectum, rectosigmoid junction, and anus: Secondary | ICD-10-CM | POA: Diagnosis not present

## 2023-05-21 DIAGNOSIS — K5790 Diverticulosis of intestine, part unspecified, without perforation or abscess without bleeding: Secondary | ICD-10-CM | POA: Diagnosis not present

## 2023-05-21 DIAGNOSIS — I10 Essential (primary) hypertension: Secondary | ICD-10-CM | POA: Diagnosis not present

## 2023-05-21 DIAGNOSIS — R32 Unspecified urinary incontinence: Secondary | ICD-10-CM | POA: Diagnosis not present

## 2023-05-21 DIAGNOSIS — Z7982 Long term (current) use of aspirin: Secondary | ICD-10-CM | POA: Diagnosis not present

## 2023-05-21 DIAGNOSIS — G301 Alzheimer's disease with late onset: Secondary | ICD-10-CM | POA: Diagnosis not present

## 2023-05-28 DIAGNOSIS — R32 Unspecified urinary incontinence: Secondary | ICD-10-CM | POA: Diagnosis not present

## 2023-05-28 DIAGNOSIS — Z85048 Personal history of other malignant neoplasm of rectum, rectosigmoid junction, and anus: Secondary | ICD-10-CM | POA: Diagnosis not present

## 2023-05-28 DIAGNOSIS — K5790 Diverticulosis of intestine, part unspecified, without perforation or abscess without bleeding: Secondary | ICD-10-CM | POA: Diagnosis not present

## 2023-05-28 DIAGNOSIS — F0280 Dementia in other diseases classified elsewhere without behavioral disturbance: Secondary | ICD-10-CM | POA: Diagnosis not present

## 2023-05-28 DIAGNOSIS — I10 Essential (primary) hypertension: Secondary | ICD-10-CM | POA: Diagnosis not present

## 2023-05-28 DIAGNOSIS — Z7982 Long term (current) use of aspirin: Secondary | ICD-10-CM | POA: Diagnosis not present

## 2023-05-28 DIAGNOSIS — G301 Alzheimer's disease with late onset: Secondary | ICD-10-CM | POA: Diagnosis not present

## 2023-05-28 DIAGNOSIS — E7800 Pure hypercholesterolemia, unspecified: Secondary | ICD-10-CM | POA: Diagnosis not present

## 2023-06-03 DIAGNOSIS — F0280 Dementia in other diseases classified elsewhere without behavioral disturbance: Secondary | ICD-10-CM | POA: Diagnosis not present

## 2023-06-03 DIAGNOSIS — Z7982 Long term (current) use of aspirin: Secondary | ICD-10-CM | POA: Diagnosis not present

## 2023-06-03 DIAGNOSIS — Z85048 Personal history of other malignant neoplasm of rectum, rectosigmoid junction, and anus: Secondary | ICD-10-CM | POA: Diagnosis not present

## 2023-06-03 DIAGNOSIS — R32 Unspecified urinary incontinence: Secondary | ICD-10-CM | POA: Diagnosis not present

## 2023-06-03 DIAGNOSIS — K5790 Diverticulosis of intestine, part unspecified, without perforation or abscess without bleeding: Secondary | ICD-10-CM | POA: Diagnosis not present

## 2023-06-03 DIAGNOSIS — E7800 Pure hypercholesterolemia, unspecified: Secondary | ICD-10-CM | POA: Diagnosis not present

## 2023-06-03 DIAGNOSIS — I10 Essential (primary) hypertension: Secondary | ICD-10-CM | POA: Diagnosis not present

## 2023-06-03 DIAGNOSIS — G301 Alzheimer's disease with late onset: Secondary | ICD-10-CM | POA: Diagnosis not present

## 2023-06-04 DIAGNOSIS — Z85048 Personal history of other malignant neoplasm of rectum, rectosigmoid junction, and anus: Secondary | ICD-10-CM | POA: Diagnosis not present

## 2023-06-04 DIAGNOSIS — Z7982 Long term (current) use of aspirin: Secondary | ICD-10-CM | POA: Diagnosis not present

## 2023-06-04 DIAGNOSIS — E7800 Pure hypercholesterolemia, unspecified: Secondary | ICD-10-CM | POA: Diagnosis not present

## 2023-06-04 DIAGNOSIS — R32 Unspecified urinary incontinence: Secondary | ICD-10-CM | POA: Diagnosis not present

## 2023-06-04 DIAGNOSIS — I10 Essential (primary) hypertension: Secondary | ICD-10-CM | POA: Diagnosis not present

## 2023-06-04 DIAGNOSIS — G301 Alzheimer's disease with late onset: Secondary | ICD-10-CM | POA: Diagnosis not present

## 2023-06-04 DIAGNOSIS — K5790 Diverticulosis of intestine, part unspecified, without perforation or abscess without bleeding: Secondary | ICD-10-CM | POA: Diagnosis not present

## 2023-06-04 DIAGNOSIS — F0280 Dementia in other diseases classified elsewhere without behavioral disturbance: Secondary | ICD-10-CM | POA: Diagnosis not present

## 2023-06-05 DIAGNOSIS — E7800 Pure hypercholesterolemia, unspecified: Secondary | ICD-10-CM | POA: Diagnosis not present

## 2023-06-05 DIAGNOSIS — K5790 Diverticulosis of intestine, part unspecified, without perforation or abscess without bleeding: Secondary | ICD-10-CM | POA: Diagnosis not present

## 2023-06-05 DIAGNOSIS — R32 Unspecified urinary incontinence: Secondary | ICD-10-CM | POA: Diagnosis not present

## 2023-06-05 DIAGNOSIS — Z7982 Long term (current) use of aspirin: Secondary | ICD-10-CM | POA: Diagnosis not present

## 2023-06-05 DIAGNOSIS — G301 Alzheimer's disease with late onset: Secondary | ICD-10-CM | POA: Diagnosis not present

## 2023-06-05 DIAGNOSIS — I10 Essential (primary) hypertension: Secondary | ICD-10-CM | POA: Diagnosis not present

## 2023-06-05 DIAGNOSIS — Z85048 Personal history of other malignant neoplasm of rectum, rectosigmoid junction, and anus: Secondary | ICD-10-CM | POA: Diagnosis not present

## 2023-06-05 DIAGNOSIS — F0280 Dementia in other diseases classified elsewhere without behavioral disturbance: Secondary | ICD-10-CM | POA: Diagnosis not present

## 2023-06-11 DIAGNOSIS — Z7982 Long term (current) use of aspirin: Secondary | ICD-10-CM | POA: Diagnosis not present

## 2023-06-11 DIAGNOSIS — K5790 Diverticulosis of intestine, part unspecified, without perforation or abscess without bleeding: Secondary | ICD-10-CM | POA: Diagnosis not present

## 2023-06-11 DIAGNOSIS — F0280 Dementia in other diseases classified elsewhere without behavioral disturbance: Secondary | ICD-10-CM | POA: Diagnosis not present

## 2023-06-11 DIAGNOSIS — E7800 Pure hypercholesterolemia, unspecified: Secondary | ICD-10-CM | POA: Diagnosis not present

## 2023-06-11 DIAGNOSIS — I10 Essential (primary) hypertension: Secondary | ICD-10-CM | POA: Diagnosis not present

## 2023-06-11 DIAGNOSIS — R32 Unspecified urinary incontinence: Secondary | ICD-10-CM | POA: Diagnosis not present

## 2023-06-11 DIAGNOSIS — G301 Alzheimer's disease with late onset: Secondary | ICD-10-CM | POA: Diagnosis not present

## 2023-06-11 DIAGNOSIS — Z85048 Personal history of other malignant neoplasm of rectum, rectosigmoid junction, and anus: Secondary | ICD-10-CM | POA: Diagnosis not present

## 2023-06-13 DIAGNOSIS — R634 Abnormal weight loss: Secondary | ICD-10-CM | POA: Diagnosis not present

## 2023-06-13 DIAGNOSIS — G301 Alzheimer's disease with late onset: Secondary | ICD-10-CM | POA: Diagnosis not present

## 2023-06-13 DIAGNOSIS — Z79899 Other long term (current) drug therapy: Secondary | ICD-10-CM | POA: Diagnosis not present

## 2023-06-13 DIAGNOSIS — Z9189 Other specified personal risk factors, not elsewhere classified: Secondary | ICD-10-CM | POA: Diagnosis not present

## 2023-06-18 ENCOUNTER — Encounter (INDEPENDENT_AMBULATORY_CARE_PROVIDER_SITE_OTHER): Payer: Self-pay

## 2023-06-18 DIAGNOSIS — I10 Essential (primary) hypertension: Secondary | ICD-10-CM | POA: Diagnosis not present

## 2023-06-18 DIAGNOSIS — Z85048 Personal history of other malignant neoplasm of rectum, rectosigmoid junction, and anus: Secondary | ICD-10-CM | POA: Diagnosis not present

## 2023-06-18 DIAGNOSIS — Z7982 Long term (current) use of aspirin: Secondary | ICD-10-CM | POA: Diagnosis not present

## 2023-06-18 DIAGNOSIS — G301 Alzheimer's disease with late onset: Secondary | ICD-10-CM | POA: Diagnosis not present

## 2023-06-18 DIAGNOSIS — K5790 Diverticulosis of intestine, part unspecified, without perforation or abscess without bleeding: Secondary | ICD-10-CM | POA: Diagnosis not present

## 2023-06-18 DIAGNOSIS — E7800 Pure hypercholesterolemia, unspecified: Secondary | ICD-10-CM | POA: Diagnosis not present

## 2023-06-18 DIAGNOSIS — F0280 Dementia in other diseases classified elsewhere without behavioral disturbance: Secondary | ICD-10-CM | POA: Diagnosis not present

## 2023-06-18 DIAGNOSIS — R32 Unspecified urinary incontinence: Secondary | ICD-10-CM | POA: Diagnosis not present

## 2023-06-19 DIAGNOSIS — G301 Alzheimer's disease with late onset: Secondary | ICD-10-CM | POA: Diagnosis not present

## 2023-06-19 DIAGNOSIS — K5790 Diverticulosis of intestine, part unspecified, without perforation or abscess without bleeding: Secondary | ICD-10-CM | POA: Diagnosis not present

## 2023-06-19 DIAGNOSIS — Z85048 Personal history of other malignant neoplasm of rectum, rectosigmoid junction, and anus: Secondary | ICD-10-CM | POA: Diagnosis not present

## 2023-06-19 DIAGNOSIS — F0280 Dementia in other diseases classified elsewhere without behavioral disturbance: Secondary | ICD-10-CM | POA: Diagnosis not present

## 2023-06-19 DIAGNOSIS — Z7982 Long term (current) use of aspirin: Secondary | ICD-10-CM | POA: Diagnosis not present

## 2023-06-19 DIAGNOSIS — R32 Unspecified urinary incontinence: Secondary | ICD-10-CM | POA: Diagnosis not present

## 2023-06-19 DIAGNOSIS — I10 Essential (primary) hypertension: Secondary | ICD-10-CM | POA: Diagnosis not present

## 2023-06-19 DIAGNOSIS — E7800 Pure hypercholesterolemia, unspecified: Secondary | ICD-10-CM | POA: Diagnosis not present

## 2023-06-25 DIAGNOSIS — E7800 Pure hypercholesterolemia, unspecified: Secondary | ICD-10-CM | POA: Diagnosis not present

## 2023-06-25 DIAGNOSIS — I10 Essential (primary) hypertension: Secondary | ICD-10-CM | POA: Diagnosis not present

## 2023-06-25 DIAGNOSIS — Z7982 Long term (current) use of aspirin: Secondary | ICD-10-CM | POA: Diagnosis not present

## 2023-06-25 DIAGNOSIS — G301 Alzheimer's disease with late onset: Secondary | ICD-10-CM | POA: Diagnosis not present

## 2023-06-25 DIAGNOSIS — Z85048 Personal history of other malignant neoplasm of rectum, rectosigmoid junction, and anus: Secondary | ICD-10-CM | POA: Diagnosis not present

## 2023-06-25 DIAGNOSIS — K5790 Diverticulosis of intestine, part unspecified, without perforation or abscess without bleeding: Secondary | ICD-10-CM | POA: Diagnosis not present

## 2023-06-25 DIAGNOSIS — R32 Unspecified urinary incontinence: Secondary | ICD-10-CM | POA: Diagnosis not present

## 2023-06-25 DIAGNOSIS — F0280 Dementia in other diseases classified elsewhere without behavioral disturbance: Secondary | ICD-10-CM | POA: Diagnosis not present

## 2023-06-27 ENCOUNTER — Encounter: Payer: Self-pay | Admitting: Internal Medicine

## 2023-07-01 DIAGNOSIS — F0280 Dementia in other diseases classified elsewhere without behavioral disturbance: Secondary | ICD-10-CM | POA: Diagnosis not present

## 2023-07-01 DIAGNOSIS — Z7982 Long term (current) use of aspirin: Secondary | ICD-10-CM | POA: Diagnosis not present

## 2023-07-01 DIAGNOSIS — G301 Alzheimer's disease with late onset: Secondary | ICD-10-CM | POA: Diagnosis not present

## 2023-07-01 DIAGNOSIS — E7800 Pure hypercholesterolemia, unspecified: Secondary | ICD-10-CM | POA: Diagnosis not present

## 2023-07-01 DIAGNOSIS — K5790 Diverticulosis of intestine, part unspecified, without perforation or abscess without bleeding: Secondary | ICD-10-CM | POA: Diagnosis not present

## 2023-07-01 DIAGNOSIS — I10 Essential (primary) hypertension: Secondary | ICD-10-CM | POA: Diagnosis not present

## 2023-07-01 DIAGNOSIS — R32 Unspecified urinary incontinence: Secondary | ICD-10-CM | POA: Diagnosis not present

## 2023-07-01 DIAGNOSIS — Z85048 Personal history of other malignant neoplasm of rectum, rectosigmoid junction, and anus: Secondary | ICD-10-CM | POA: Diagnosis not present

## 2023-07-01 NOTE — Telephone Encounter (Signed)
 Discussed with you. See if they can order as we discussed.

## 2023-07-01 NOTE — Telephone Encounter (Signed)
 Called and discussed with son. Going to try to help find somewhere local to get supplies

## 2023-07-02 DIAGNOSIS — I10 Essential (primary) hypertension: Secondary | ICD-10-CM | POA: Diagnosis not present

## 2023-07-02 DIAGNOSIS — Z85048 Personal history of other malignant neoplasm of rectum, rectosigmoid junction, and anus: Secondary | ICD-10-CM | POA: Diagnosis not present

## 2023-07-02 DIAGNOSIS — E7800 Pure hypercholesterolemia, unspecified: Secondary | ICD-10-CM | POA: Diagnosis not present

## 2023-07-02 DIAGNOSIS — F0280 Dementia in other diseases classified elsewhere without behavioral disturbance: Secondary | ICD-10-CM | POA: Diagnosis not present

## 2023-07-02 DIAGNOSIS — R32 Unspecified urinary incontinence: Secondary | ICD-10-CM | POA: Diagnosis not present

## 2023-07-02 DIAGNOSIS — K5790 Diverticulosis of intestine, part unspecified, without perforation or abscess without bleeding: Secondary | ICD-10-CM | POA: Diagnosis not present

## 2023-07-02 DIAGNOSIS — G301 Alzheimer's disease with late onset: Secondary | ICD-10-CM | POA: Diagnosis not present

## 2023-07-02 DIAGNOSIS — Z7982 Long term (current) use of aspirin: Secondary | ICD-10-CM | POA: Diagnosis not present

## 2023-07-02 NOTE — Telephone Encounter (Signed)
 Info given to son for [facility]. Spoke with [employer]. Supplies are in stock.

## 2023-07-03 DIAGNOSIS — F0280 Dementia in other diseases classified elsewhere without behavioral disturbance: Secondary | ICD-10-CM | POA: Diagnosis not present

## 2023-07-03 DIAGNOSIS — Z85048 Personal history of other malignant neoplasm of rectum, rectosigmoid junction, and anus: Secondary | ICD-10-CM | POA: Diagnosis not present

## 2023-07-03 DIAGNOSIS — Z7982 Long term (current) use of aspirin: Secondary | ICD-10-CM | POA: Diagnosis not present

## 2023-07-03 DIAGNOSIS — E7800 Pure hypercholesterolemia, unspecified: Secondary | ICD-10-CM | POA: Diagnosis not present

## 2023-07-03 DIAGNOSIS — I10 Essential (primary) hypertension: Secondary | ICD-10-CM | POA: Diagnosis not present

## 2023-07-03 DIAGNOSIS — G301 Alzheimer's disease with late onset: Secondary | ICD-10-CM | POA: Diagnosis not present

## 2023-07-03 DIAGNOSIS — K5790 Diverticulosis of intestine, part unspecified, without perforation or abscess without bleeding: Secondary | ICD-10-CM | POA: Diagnosis not present

## 2023-07-03 DIAGNOSIS — R32 Unspecified urinary incontinence: Secondary | ICD-10-CM | POA: Diagnosis not present

## 2023-07-12 DIAGNOSIS — D692 Other nonthrombocytopenic purpura: Secondary | ICD-10-CM | POA: Diagnosis not present

## 2023-07-12 DIAGNOSIS — D2261 Melanocytic nevi of right upper limb, including shoulder: Secondary | ICD-10-CM | POA: Diagnosis not present

## 2023-07-12 DIAGNOSIS — D2262 Melanocytic nevi of left upper limb, including shoulder: Secondary | ICD-10-CM | POA: Diagnosis not present

## 2023-07-12 DIAGNOSIS — D225 Melanocytic nevi of trunk: Secondary | ICD-10-CM | POA: Diagnosis not present

## 2023-07-16 DIAGNOSIS — Z933 Colostomy status: Secondary | ICD-10-CM | POA: Diagnosis not present

## 2023-07-16 DIAGNOSIS — Z85038 Personal history of other malignant neoplasm of large intestine: Secondary | ICD-10-CM | POA: Diagnosis not present

## 2023-07-16 DIAGNOSIS — K080 Exfoliation of teeth due to systemic causes: Secondary | ICD-10-CM | POA: Diagnosis not present

## 2023-07-19 ENCOUNTER — Ambulatory Visit: Admitting: Internal Medicine

## 2023-07-26 ENCOUNTER — Ambulatory Visit: Payer: Self-pay | Admitting: Internal Medicine

## 2023-07-26 ENCOUNTER — Ambulatory Visit (INDEPENDENT_AMBULATORY_CARE_PROVIDER_SITE_OTHER): Admitting: Internal Medicine

## 2023-07-26 ENCOUNTER — Other Ambulatory Visit: Payer: Self-pay | Admitting: Internal Medicine

## 2023-07-26 VITALS — BP 120/70 | HR 67 | Temp 98.0°F | Resp 16 | Ht 61.0 in | Wt 104.8 lb

## 2023-07-26 DIAGNOSIS — Z89422 Acquired absence of other left toe(s): Secondary | ICD-10-CM | POA: Diagnosis not present

## 2023-07-26 DIAGNOSIS — R739 Hyperglycemia, unspecified: Secondary | ICD-10-CM

## 2023-07-26 DIAGNOSIS — E7800 Pure hypercholesterolemia, unspecified: Secondary | ICD-10-CM | POA: Diagnosis not present

## 2023-07-26 DIAGNOSIS — Z933 Colostomy status: Secondary | ICD-10-CM

## 2023-07-26 DIAGNOSIS — I10 Essential (primary) hypertension: Secondary | ICD-10-CM

## 2023-07-26 DIAGNOSIS — I6523 Occlusion and stenosis of bilateral carotid arteries: Secondary | ICD-10-CM | POA: Diagnosis not present

## 2023-07-26 DIAGNOSIS — I771 Stricture of artery: Secondary | ICD-10-CM

## 2023-07-26 DIAGNOSIS — D72818 Other decreased white blood cell count: Secondary | ICD-10-CM

## 2023-07-26 DIAGNOSIS — Z85048 Personal history of other malignant neoplasm of rectum, rectosigmoid junction, and anus: Secondary | ICD-10-CM

## 2023-07-26 DIAGNOSIS — S98132A Complete traumatic amputation of one left lesser toe, initial encounter: Secondary | ICD-10-CM

## 2023-07-26 LAB — CBC WITH DIFFERENTIAL/PLATELET
Basophils Absolute: 0.1 10*3/uL (ref 0.0–0.1)
Basophils Relative: 1.1 % (ref 0.0–3.0)
Eosinophils Absolute: 0 10*3/uL (ref 0.0–0.7)
Eosinophils Relative: 0.4 % (ref 0.0–5.0)
HCT: 37.1 % (ref 36.0–46.0)
Hemoglobin: 12.7 g/dL (ref 12.0–15.0)
Lymphocytes Relative: 10.4 % — ABNORMAL LOW (ref 12.0–46.0)
Lymphs Abs: 0.6 10*3/uL — ABNORMAL LOW (ref 0.7–4.0)
MCHC: 34.2 g/dL (ref 30.0–36.0)
MCV: 105.4 fl — ABNORMAL HIGH (ref 78.0–100.0)
Monocytes Absolute: 0.6 10*3/uL (ref 0.1–1.0)
Monocytes Relative: 9.2 % (ref 3.0–12.0)
Neutro Abs: 4.9 10*3/uL (ref 1.4–7.7)
Neutrophils Relative %: 78.9 % — ABNORMAL HIGH (ref 43.0–77.0)
Platelets: 239 10*3/uL (ref 150.0–400.0)
RBC: 3.52 Mil/uL — ABNORMAL LOW (ref 3.87–5.11)
RDW: 13.7 % (ref 11.5–15.5)
WBC: 6.2 10*3/uL (ref 4.0–10.5)

## 2023-07-26 LAB — BASIC METABOLIC PANEL WITH GFR
BUN: 39 mg/dL — ABNORMAL HIGH (ref 6–23)
CO2: 34 meq/L — ABNORMAL HIGH (ref 19–32)
Calcium: 9.3 mg/dL (ref 8.4–10.5)
Chloride: 104 meq/L (ref 96–112)
Creatinine, Ser: 0.83 mg/dL (ref 0.40–1.20)
GFR: 64.09 mL/min (ref >60.00)
Glucose, Bld: 112 mg/dL — ABNORMAL HIGH (ref 70–99)
Potassium: 4 meq/L (ref 3.5–5.1)
Sodium: 142 meq/L (ref 135–145)

## 2023-07-26 LAB — HEPATIC FUNCTION PANEL
ALT: 20 U/L (ref 0–35)
AST: 23 U/L (ref 0–37)
Albumin: 3.9 g/dL (ref 3.5–5.2)
Alkaline Phosphatase: 36 U/L — ABNORMAL LOW (ref 39–117)
Bilirubin, Direct: 0.2 mg/dL (ref 0.0–0.3)
Total Bilirubin: 0.7 mg/dL (ref 0.2–1.2)
Total Protein: 6.6 g/dL (ref 6.0–8.3)

## 2023-07-26 LAB — LIPID PANEL
Cholesterol: 95 mg/dL (ref 0–200)
HDL: 33.1 mg/dL — ABNORMAL LOW (ref >39.00)
LDL Cholesterol: 50 mg/dL (ref 0–99)
NonHDL: 61.62
Total CHOL/HDL Ratio: 3
Triglycerides: 60 mg/dL (ref 0.0–149.0)
VLDL: 12 mg/dL (ref 0.0–40.0)

## 2023-07-26 LAB — TSH: TSH: 1.87 u[IU]/mL (ref 0.35–5.50)

## 2023-07-26 LAB — HEMOGLOBIN A1C: Hgb A1c MFr Bld: 5.7 % (ref 4.6–6.5)

## 2023-07-26 MED ORDER — LOSARTAN POTASSIUM-HCTZ 50-12.5 MG PO TABS: 1.0000 | ORAL_TABLET | Freq: Every day | ORAL | 3 refills | Status: AC

## 2023-07-26 MED ORDER — LOVASTATIN 10 MG PO TABS: 10.0000 mg | ORAL_TABLET | Freq: Every day | ORAL | 3 refills | Status: DC

## 2023-07-26 NOTE — Progress Notes (Signed)
Order placed for f/u labs.  

## 2023-07-26 NOTE — Progress Notes (Unsigned)
 Subjective:    [name], female    DOB: 05/07/1935, 88 y.o.   MRN: [id number]  Patient here for  Chief Complaint  Patient presents with   Medical Management of Chronic Issues    HPI Here for a scheduled follow up - follow up regarding hypercholesterolemia and hypertension. Followed by oncology - history of rectal cancer. Last colonoscopy in February 2022 was reported as normal with no evidence of progression or recurrent disease. Plan is to repeat in 2027. Saw [facility] 07/2022 - Carotid Duplex shows RICA1-39%and LICA 40-59% stenosis. Recommended continuing antiplatelet therapy.  F/u 12 months. Saw neurology for cognitive impairment.  MRI 09/10/22 - No acute finding. Mild parenchymal volume loss without discernible lobar  predilection or disproportionate hippocampal atrophy, and  mild-to-moderate underlying chronic small-vessel ischemic change. Chronic right sphenoid sinusitis with probable inspissated  secretions and/or fungal colonization, and layering fluid in the  maxillary sinuses which may reflect acute sinusitis in the correct clinical setting. Small right mastoid effusion. Recommended to continue aricept and ENT evaluation. She comes in today accompanied by her two sons. History obtained from all of them.  had f/u with neurology 06/13/23 - recommended continuing aricept 10mg . Recommended home health for cognitive training and PT. Advised to avoid driving.    Past Medical History:  Diagnosis Date   Colon cancer [facility])    History of shingles    twice   Hypercholesteremia    Hypertension    Left carotid bruit    Nephrolithiasis    Neuropathy    Personal history of chemotherapy 2014   Colon   Personal history of radiation therapy 2014   Colon   Urine incontinence 03/06/2013   h/o   Past Surgical History:  Procedure Laterality Date   APPENDECTOMY  1979   colorectal   CESAREAN SECTION     COLONOSCOPY WITH PROPOFOL  N/A 03/21/2015   Procedure: COLONOSCOPY WITH PROPOFOL ;   Surgeon: Deward CINDERELLA Piedmont, MD;  Location: [facility] ENDOSCOPY;  Service: Gastroenterology;  Laterality: N/A;   COLONOSCOPY WITH PROPOFOL  N/A 03/21/2020   Procedure: COLONOSCOPY WITH PROPOFOL ;  Surgeon: Maryruth Ole DASEN, MD;  Location: [facility] ENDOSCOPY;  Service: Endoscopy;  Laterality: N/A;   COLOSTOMY     surgery for colorectal cancer     VENTRAL HERNIA REPAIR Right [phone]   Family History  Problem Relation Age of Onset   Hypertension Mother    Hypertension Father    Breast cancer Paternal Aunt    Social History   Socioeconomic History   Marital status: Widowed    Spouse name: Not on file   Number of children: Not on file   Years of education: Not on file   Highest education level: Bachelor's degree (e.g., BA, AB, BS)  Occupational History   Not on file  Tobacco Use   Smoking status: Never   Smokeless tobacco: Never  Vaping Use   Vaping status: Never Used  Substance and Sexual Activity   Alcohol use: No    Alcohol/week: 0.0 standard drinks of alcohol   Drug use: No   Sexual activity: Not Currently  Other Topics Concern   Not on file  Social History Narrative   Not on file   Social Drivers of Health   Financial Resource Strain: Low Risk  (04/11/2023)   Overall Financial Resource Strain (CARDIA)    Difficulty of Paying Living Expenses: Not hard at all  Food Insecurity: No Food Insecurity (04/11/2023)   Hunger Vital Sign    Worried About Running  Out of Food in the Last Year: Never true    Ran Out of Food in the Last Year: Never true  Transportation Needs: No Transportation Needs (04/11/2023)   PRAPARE - Administrator, Civil Service (Medical): No    Lack of Transportation (Non-Medical): No  Physical Activity: Sufficiently Active (04/11/2023)   Exercise Vital Sign    Days of Exercise per Week: 7 days    Minutes of Exercise per Session: 30 min  Stress: No Stress Concern Present (04/11/2023)   [employer] of Occupational Health - Occupational Stress Questionnaire     Feeling of Stress : Not at all  Social Connections: Moderately Integrated (04/11/2023)   Social Connection and Isolation Panel    Frequency of Communication with Friends and Family: More than three times a week    Frequency of Social Gatherings with Friends and Family: Twice a week    Attends Religious Services: More than 4 times per year    Active Member of [employer] or Organizations: Yes    Attends Banker Meetings: More than 4 times per year    Marital Status: Widowed     Review of Systems     Objective:     BP 120/70   Pulse 67   Temp 98 F (36.7 C)   Resp 16   Ht 5' 1 (1.549 m)   Wt 104 lb 12.8 oz (47.5 kg)   SpO2 98%   BMI 19.80 kg/m  Wt Readings from Last 3 Encounters:  07/26/23 104 lb 12.8 oz (47.5 kg)  04/16/23 110 lb (49.9 kg)  04/15/23 113 lb (51.3 kg)    Physical Exam  {Perform Simple Foot Exam  Perform Detailed exam:1} {Insert foot Exam (Optional):30965}   Outpatient Encounter Medications as of 07/26/2023  Medication Sig   aspirin EC 81 MG tablet Take 81 mg by mouth daily as needed.   calcium citrate-vitamin D (CITRACAL+D) 315-200 MG-UNIT per tablet Take 1 tablet by mouth 2 (two) times daily.   donepezil (ARICEPT) 10 MG tablet Take 10 mg by mouth daily.   losartan -hydrochlorothiazide  (HYZAAR) 50-12.5 MG tablet TAKE 1 TABLET BY MOUTH DAILY   lovastatin  (MEVACOR ) 10 MG tablet TAKE 1 TABLET(10 MG) BY MOUTH AT BEDTIME   Multiple Vitamin (MULTI-VITAMINS) TABS Take 1 tablet by mouth daily.    [DISCONTINUED] donepezil (ARICEPT) 5 MG tablet Take 1 tablet by mouth at bedtime.   Facility-Administered Encounter Medications as of 07/26/2023  Medication   sodium chloride  flush (NS) 0.9 % injection 10 mL     Lab Results  Component Value Date   WBC 6.9 08/20/2022   HGB 12.8 08/20/2022   HCT 38.7 08/20/2022   PLT 293 08/20/2022   GLUCOSE 117 (H) 04/12/2023   CHOL 104 04/12/2023   TRIG 72.0 04/12/2023   HDL 41.70 04/12/2023   LDLCALC 48  04/12/2023   ALT 23 04/12/2023   AST 25 04/12/2023   NA 142 04/12/2023   K 4.2 04/12/2023   CL 103 04/12/2023   CREATININE 0.82 04/12/2023   BUN 25 (H) 04/12/2023   CO2 31 04/12/2023   TSH 3.62 01/01/2022   INR 1.1 10/21/2012   HGBA1C 5.7 04/12/2023   MICROALBUR 4.0 (H) 01/04/2022    MM 3D SCREENING MAMMOGRAM BILATERAL BREAST Result Date: 01/17/2023 CLINICAL DATA:  Screening. EXAM: DIGITAL SCREENING BILATERAL MAMMOGRAM WITH TOMOSYNTHESIS AND CAD TECHNIQUE: Bilateral screening digital craniocaudal and mediolateral oblique mammograms were obtained. Bilateral screening digital breast tomosynthesis was performed. The images were evaluated with  computer-aided detection. COMPARISON:  Previous exam(s). ACR Breast Density Category d: The breasts are extremely dense, which lowers the sensitivity of mammography. FINDINGS: There are no findings suspicious for malignancy. IMPRESSION: No mammographic evidence of malignancy. A result letter of this screening mammogram will be mailed directly to the patient. RECOMMENDATION: Screening mammogram in one year. (Code:SM-B-01Y) BI-RADS CATEGORY  1: Negative. Electronically Signed   By: Delon Music M.D.   On: 01/17/2023 08:47       Assessment & Plan:  Hypercholesterolemia  Hyperglycemia     Allena Hamilton, MD

## 2023-07-28 ENCOUNTER — Encounter: Payer: Self-pay | Admitting: Internal Medicine

## 2023-07-28 NOTE — Assessment & Plan Note (Signed)
Ostomy working well.  

## 2023-07-28 NOTE — Assessment & Plan Note (Signed)
Saw oncology 08/20/22- colonoscopy in February 2022 was reported as normal with no evidence of progression or recurrent disease.  Recommendation was repeat in 5 years. CT scan showed no sign of disease recurrence. Follow up in 1 year or sooner as needed.

## 2023-07-28 NOTE — Assessment & Plan Note (Signed)
 Low carb diet and exercise.  Check met b and A1c today.  Lab Results  Component Value Date   HGBA1C 5.7 07/26/2023

## 2023-07-28 NOTE — Assessment & Plan Note (Signed)
 On lovastatin .  Low cholesterol diet and exercise.  Check lipid panel today.  Lab Results  Component Value Date   CHOL 95 07/26/2023   HDL 33.10 (L) 07/26/2023   LDLCALC 50 07/26/2023   TRIG 60.0 07/26/2023   CHOLHDL 3 07/26/2023

## 2023-07-28 NOTE — Assessment & Plan Note (Signed)
 Continue losartan /hctz.  Blood pressure as outlined.  Follow pressures.  Check metabolic panel today.

## 2023-07-28 NOTE — Assessment & Plan Note (Signed)
Followed by [facility].  Stable.  Evaluated 7/24 - stable.  Recommended f/u in one year.

## 2023-07-28 NOTE — Assessment & Plan Note (Signed)
 S/p amputation - toe.  Stable.

## 2023-07-28 NOTE — Assessment & Plan Note (Signed)
 Saw [facility] 07/2022 - Carotid Duplex shows RICA1-39%and LICA 40-59% stenosis. Recommended continuing antiplatelet therapy.  F/u 12 months. Continue risk factor modification.

## 2023-07-30 DIAGNOSIS — K080 Exfoliation of teeth due to systemic causes: Secondary | ICD-10-CM | POA: Diagnosis not present

## 2023-08-15 ENCOUNTER — Encounter (INDEPENDENT_AMBULATORY_CARE_PROVIDER_SITE_OTHER): Payer: Self-pay | Admitting: Vascular Surgery

## 2023-08-15 ENCOUNTER — Other Ambulatory Visit (INDEPENDENT_AMBULATORY_CARE_PROVIDER_SITE_OTHER): Payer: Medicare Other

## 2023-08-15 ENCOUNTER — Ambulatory Visit (INDEPENDENT_AMBULATORY_CARE_PROVIDER_SITE_OTHER): Payer: Medicare Other | Admitting: Vascular Surgery

## 2023-08-15 VITALS — BP 158/73 | HR 56 | Ht 61.0 in | Wt 102.1 lb

## 2023-08-15 DIAGNOSIS — I10 Essential (primary) hypertension: Secondary | ICD-10-CM | POA: Diagnosis not present

## 2023-08-15 DIAGNOSIS — I6523 Occlusion and stenosis of bilateral carotid arteries: Secondary | ICD-10-CM

## 2023-08-15 DIAGNOSIS — I771 Stricture of artery: Secondary | ICD-10-CM | POA: Diagnosis not present

## 2023-08-15 DIAGNOSIS — E7800 Pure hypercholesterolemia, unspecified: Secondary | ICD-10-CM

## 2023-08-15 NOTE — Progress Notes (Signed)
 MRN : [id number]  Christina Mathis is a 88 y.o. (Aug 08, 1935) female who presents with chief complaint of check carotid arteries.  History of Present Illness:   The patient is seen for follow up evaluation of carotid stenosis. The carotid stenosis followed by ultrasound.    The patient denies amaurosis fugax. There is no recent history of TIA symptoms or focal motor deficits. There is no prior documented CVA.  She denies right hand pain or claudication symptoms.  She denies dizziness   The patient is taking enteric-coated aspirin 81 mg daily.   There is no history of migraine headaches. There is no history of seizures.   The patient has a history of coronary artery disease, no recent episodes of angina or shortness of breath. The patient denies PAD or claudication symptoms. There is a history of hyperlipidemia which is being treated with a statin.     Carotid Duplex done today shows RICA 1-39%and LICA 40-59% stenosis. The left vertebral has antegrade flow in the right vertebral artery has retrograde flow.    Current Meds  Medication Sig   aspirin EC 81 MG tablet Take 81 mg by mouth daily as needed.   calcium citrate-vitamin D (CITRACAL+D) 315-200 MG-UNIT per tablet Take 1 tablet by mouth 2 (two) times daily.   donepezil (ARICEPT) 10 MG tablet Take 10 mg by mouth daily.   losartan -hydrochlorothiazide  (HYZAAR) 50-12.5 MG tablet Take 1 tablet by mouth daily.   lovastatin  (MEVACOR ) 10 MG tablet Take 1 tablet (10 mg total) by mouth at bedtime.   Multiple Vitamin (MULTI-VITAMINS) TABS Take 1 tablet by mouth daily.     Past Medical History:  Diagnosis Date   Colon cancer [facility])    History of shingles    twice   Hypercholesteremia    Hypertension    Left carotid bruit    Nephrolithiasis    Neuropathy    Personal history of chemotherapy 2014   Colon   Personal history of radiation therapy 2014   Colon   Urine incontinence 03/06/2013   h/o    Past  Surgical History:  Procedure Laterality Date   APPENDECTOMY  1979   colorectal   CESAREAN SECTION     COLONOSCOPY WITH PROPOFOL  N/A 03/21/2015   Procedure: COLONOSCOPY WITH PROPOFOL ;  Surgeon: Deward CINDERELLA Piedmont, MD;  Location: [facility] ENDOSCOPY;  Service: Gastroenterology;  Laterality: N/A;   COLONOSCOPY WITH PROPOFOL  N/A 03/21/2020   Procedure: COLONOSCOPY WITH PROPOFOL ;  Surgeon: Maryruth Ole DASEN, MD;  Location: [facility] ENDOSCOPY;  Service: Endoscopy;  Laterality: N/A;   COLOSTOMY     surgery for colorectal cancer     VENTRAL HERNIA REPAIR Right [phone]    Social History Social History   Tobacco Use   Smoking status: Never   Smokeless tobacco: Never  Vaping Use   Vaping status: Never Used  Substance Use Topics   Alcohol use: No    Alcohol/week: 0.0 standard drinks of alcohol   Drug use: No    Family History Family History  Problem Relation Age of Onset   Hypertension Mother    Hypertension Father    Breast cancer Paternal Aunt     No Known Allergies   REVIEW OF SYSTEMS (Negative unless checked)  Constitutional: [] Weight loss  [] Fever  [] Chills Cardiac: [] Chest pain   [] Chest pressure   [] Palpitations   [] Shortness of breath when laying  flat   [] Shortness of breath with exertion. Vascular:  [x] Pain in legs with walking   [] Pain in legs at rest  [] History of DVT   [] Phlebitis   [] Swelling in legs   [] Varicose veins   [] Non-healing ulcers Pulmonary:   [] Uses home oxygen   [] Productive cough   [] Hemoptysis   [] Wheeze  [] COPD   [] Asthma Neurologic:  [] Dizziness   [] Seizures   [] History of stroke   [] History of TIA  [] Aphasia   [] Vissual changes   [] Weakness or numbness in arm   [] Weakness or numbness in leg Musculoskeletal:   [] Joint swelling   [] Joint pain   [] Low back pain Hematologic:  [] Easy bruising  [] Easy bleeding   [] Hypercoagulable state   [] Anemic Gastrointestinal:  [] Diarrhea   [] Vomiting  [] Gastroesophageal reflux/heartburn   [] Difficulty swallowing. Genitourinary:   [] Chronic kidney disease   [] Difficult urination  [] Frequent urination   [] Blood in urine Skin:  [] Rashes   [] Ulcers  Psychological:  [] History of anxiety   []  History of major depression.  Physical Examination  Vitals:   08/15/23 1341  BP: (!) 158/73  Pulse: (!) 56  Weight: 102 lb 2 oz (46.3 kg)  Height: 5' 1 (1.549 m)   Body mass index is 19.3 kg/m. Gen: WD/WN, NAD Head: [place]/AT, No temporalis wasting.  Ear/Nose/Throat: Hearing grossly intact, nares w/o erythema or drainage Eyes: PER, EOMI, sclera nonicteric.  Neck: Supple, no masses.  No bruit or JVD.  Pulmonary:  Good air movement, no audible wheezing, no use of accessory muscles.  Cardiac: RRR, normal S1, S2, no Murmurs. Vascular:  carotid bruit noted Vessel Right Left  Radial Palpable Palpable  Carotid  Palpable  Palpable  Subclav  Palpable Palpable  Gastrointestinal: soft, non-distended. No guarding/no peritoneal signs.  Musculoskeletal: M/S 5/5 throughout.  No visible deformity.  Neurologic: CN 2-12 intact. Pain and light touch intact in extremities.  Symmetrical.  Speech is fluent. Motor exam as listed above. Psychiatric: Judgment intact, Mood & affect appropriate for pt's clinical situation. Dermatologic: No rashes or ulcers noted.  No changes consistent with cellulitis.   CBC Lab Results  Component Value Date   WBC 6.2 07/26/2023   HGB 12.7 07/26/2023   HCT 37.1 07/26/2023   MCV 105.4 (H) 07/26/2023   PLT 239.0 07/26/2023    BMET    Component Value Date/Time   NA 142 07/26/2023 1021   NA 140 11/30/2013 1121   K 4.0 07/26/2023 1021   K 4.0 11/30/2013 1121   CL 104 07/26/2023 1021   CL 105 11/30/2013 1121   CO2 34 (H) 07/26/2023 1021   CO2 31 11/30/2013 1121   GLUCOSE 112 (H) 07/26/2023 1021   GLUCOSE 103 (H) 11/30/2013 1121   BUN 39 (H) 07/26/2023 1021   BUN 14 11/30/2013 1121   CREATININE 0.83 07/26/2023 1021   CREATININE 0.99 08/20/2022 0956   CREATININE 0.83 11/30/2013 1121   CALCIUM 9.3  07/26/2023 1021   CALCIUM 8.2 (L) 11/30/2013 1121   GFRNONAA 56 (L) 08/20/2022 0956   GFRNONAA >60 11/30/2013 1121   GFRNONAA >60 08/31/2013 0917   GFRAA >60 08/17/2019 1024   GFRAA >60 11/30/2013 1121   GFRAA >60 08/31/2013 0917   Estimated Creatinine Clearance: 36.2 mL/min (by C-G formula based on SCr of 0.83 mg/dL).  COAG Lab Results  Component Value Date   INR 1.1 10/21/2012    Radiology No results found.   Assessment/Plan 1. Bilateral carotid artery stenosis (Primary) Recommend:   Given the patient's asymptomatic subcritical  stenosis no further invasive testing or surgery at this time.   Carotid Duplex shows RICA 1-39%and LICA 40-59% stenosis. The left vertebral has antegrade flow in the right vertebral artery has retrograde flow.     Continue antiplatelet therapy as prescribed Continue management of CAD, HTN and Hyperlipidemia Healthy heart diet,  encouraged exercise at least 4 times per week Follow up in 12 months with duplex ultrasound and physical exam  - VAS US  CAROTID; Future  2. Subclavian arterial stenosis (HCC) She remains asymptomatic.  We will continue to monitor with annual carotid studies.  - VAS US  CAROTID; Future  3. Hypertension, essential Continue antihypertensive medications as already ordered, these medications have been reviewed and there are no changes at this time.  4. Hypercholesterolemia Continue statin as ordered and reviewed, no changes at this time    Cordella Shawl, MD  08/15/2023 1:44 PM

## 2023-08-17 ENCOUNTER — Encounter (INDEPENDENT_AMBULATORY_CARE_PROVIDER_SITE_OTHER): Payer: Self-pay | Admitting: Vascular Surgery

## 2023-08-20 ENCOUNTER — Ambulatory Visit: Payer: Medicare Other | Admitting: Nurse Practitioner

## 2023-08-22 ENCOUNTER — Inpatient Hospital Stay

## 2023-08-22 ENCOUNTER — Inpatient Hospital Stay: Attending: Nurse Practitioner | Admitting: Nurse Practitioner

## 2023-08-22 ENCOUNTER — Other Ambulatory Visit: Payer: Self-pay

## 2023-08-22 ENCOUNTER — Encounter: Payer: Self-pay | Admitting: Nurse Practitioner

## 2023-08-22 ENCOUNTER — Encounter: Payer: Self-pay | Admitting: Oncology

## 2023-08-22 VITALS — BP 160/52 | HR 60 | Temp 97.7°F | Resp 18 | Wt 102.6 lb

## 2023-08-22 DIAGNOSIS — Z87442 Personal history of urinary calculi: Secondary | ICD-10-CM | POA: Diagnosis not present

## 2023-08-22 DIAGNOSIS — I10 Essential (primary) hypertension: Secondary | ICD-10-CM | POA: Insufficient documentation

## 2023-08-22 DIAGNOSIS — R634 Abnormal weight loss: Secondary | ICD-10-CM | POA: Insufficient documentation

## 2023-08-22 DIAGNOSIS — Z08 Encounter for follow-up examination after completed treatment for malignant neoplasm: Secondary | ICD-10-CM | POA: Diagnosis not present

## 2023-08-22 DIAGNOSIS — D7589 Other specified diseases of blood and blood-forming organs: Secondary | ICD-10-CM | POA: Diagnosis not present

## 2023-08-22 DIAGNOSIS — Z9221 Personal history of antineoplastic chemotherapy: Secondary | ICD-10-CM | POA: Diagnosis not present

## 2023-08-22 DIAGNOSIS — E7800 Pure hypercholesterolemia, unspecified: Secondary | ICD-10-CM | POA: Diagnosis not present

## 2023-08-22 DIAGNOSIS — Z79899 Other long term (current) drug therapy: Secondary | ICD-10-CM | POA: Insufficient documentation

## 2023-08-22 DIAGNOSIS — G629 Polyneuropathy, unspecified: Secondary | ICD-10-CM | POA: Insufficient documentation

## 2023-08-22 DIAGNOSIS — Z85048 Personal history of other malignant neoplasm of rectum, rectosigmoid junction, and anus: Secondary | ICD-10-CM | POA: Insufficient documentation

## 2023-08-22 DIAGNOSIS — Z803 Family history of malignant neoplasm of breast: Secondary | ICD-10-CM | POA: Diagnosis not present

## 2023-08-22 DIAGNOSIS — Z923 Personal history of irradiation: Secondary | ICD-10-CM | POA: Insufficient documentation

## 2023-08-22 LAB — CBC WITH DIFFERENTIAL/PLATELET
Abs Immature Granulocytes: 0.01 K/uL (ref 0.00–0.07)
Basophils Absolute: 0.1 K/uL (ref 0.0–0.1)
Basophils Relative: 1 %
Eosinophils Absolute: 0 K/uL (ref 0.0–0.5)
Eosinophils Relative: 0 %
HCT: 36.3 % (ref 36.0–46.0)
Hemoglobin: 12.2 g/dL (ref 12.0–15.0)
Immature Granulocytes: 0 %
Lymphocytes Relative: 11 %
Lymphs Abs: 0.8 K/uL (ref 0.7–4.0)
MCH: 35.9 pg — ABNORMAL HIGH (ref 26.0–34.0)
MCHC: 33.6 g/dL (ref 30.0–36.0)
MCV: 106.8 fL — ABNORMAL HIGH (ref 80.0–100.0)
Monocytes Absolute: 0.6 K/uL (ref 0.1–1.0)
Monocytes Relative: 9 %
Neutro Abs: 5.2 K/uL (ref 1.7–7.7)
Neutrophils Relative %: 79 %
Platelets: 215 K/uL (ref 150–400)
RBC: 3.4 MIL/uL — ABNORMAL LOW (ref 3.87–5.11)
RDW: 13.5 % (ref 11.5–15.5)
WBC: 6.6 K/uL (ref 4.0–10.5)
nRBC: 0 % (ref 0.0–0.2)

## 2023-08-22 LAB — COMPREHENSIVE METABOLIC PANEL WITH GFR
ALT: 24 U/L (ref 0–44)
AST: 26 U/L (ref 15–41)
Albumin: 3.7 g/dL (ref 3.5–5.0)
Alkaline Phosphatase: 41 U/L (ref 38–126)
Anion gap: 7 (ref 5–15)
BUN: 34 mg/dL — ABNORMAL HIGH (ref 8–23)
CO2: 28 mmol/L (ref 22–32)
Calcium: 9 mg/dL (ref 8.9–10.3)
Chloride: 103 mmol/L (ref 98–111)
Creatinine, Ser: 0.86 mg/dL (ref 0.44–1.00)
GFR, Estimated: >60 mL/min (ref >60)
Glucose, Bld: 123 mg/dL — ABNORMAL HIGH (ref 70–99)
Potassium: 4.5 mmol/L (ref 3.5–5.1)
Sodium: 138 mmol/L (ref 135–145)
Total Bilirubin: 0.9 mg/dL (ref 0.0–1.2)
Total Protein: 6.4 g/dL — ABNORMAL LOW (ref 6.5–8.1)

## 2023-08-22 NOTE — Progress Notes (Signed)
 [facility]  [phone]  [phone]   Christina Mathis DOB: December 09, 1935  MR#: [id number]  RDW#:[id number]  Patient Care Team: Glendia Shad, MD as PCP - General (Internal Medicine) Regal, Pasco RAMAN, DPM as Consulting Physician (Podiatry) Jacobo, Evalene PARAS, MD as Consulting Physician (Oncology) Aundria Ladell POUR, MD as Consulting Physician (Gastroenterology)  CHIEF COMPLAINT: Adenocarcinoma of the rectum  INTERVAL HISTORY: Christina Mathis is a 88 y.o. female who returns to clinic for routine yearly evaluation.   Christina Mathis returns to clinic today for repeat laboratory work and routine yearly evaluation.  She continues to feel well and remains asymptomatic of recurrence. She remains active. No concerns with ostomy. No changes in stool, black or bloody stools. No unintentional weight loss, new pain.   REVIEW OF SYSTEMS:   Review of Systems  Constitutional: Negative.  Negative for fever, malaise/fatigue and weight loss.  Respiratory: Negative.  Negative for cough and shortness of breath.   Cardiovascular: Negative.  Negative for chest pain and leg swelling.  Gastrointestinal: Negative.  Negative for abdominal pain, blood in stool and melena.  Genitourinary: Negative.  Negative for dysuria.  Musculoskeletal: Negative.  Negative for back pain.  Skin: Negative.  Negative for rash.  Neurological: Negative.  Negative for tingling, sensory change, focal weakness, weakness and headaches.  Psychiatric/Behavioral: Negative.  The patient is not nervous/anxious.   As per HPI. Otherwise, a complete review of systems is negative.  ONCOLOGY HISTORY: Oncology History Overview Note  66. 88 year old female with stage III (T3, N1, M0) adenocarcinoma of the rectum for  preop chemoradiation 2. Starting 5-FU by continuous infusion and radiation therapy November 24, 2012 3. Patient is finishing up radiation and chemotherapy on December 15 of 2014 4. Status postsurgery and  colostomyFebruary of 2015, pT0 pNO tumor stage 0 5. Post operative adjuvant with FOLFOX march 2015 6.patient finished total 6 cycles of chemotherapy with FOLFOX on August 31, 2013. 7.  January 2 016 Christina Mathis had hernia repair     PAST MEDICAL HISTORY: Past Medical History:  Diagnosis Date   Colon cancer [facility])    History of shingles    twice   Hypercholesteremia    Hypertension    Left carotid bruit    Nephrolithiasis    Neuropathy    Personal history of chemotherapy 2014   Colon   Personal history of radiation therapy 2014   Colon   Urine incontinence 03/06/2013   h/o    PAST SURGICAL HISTORY: Past Surgical History:  Procedure Laterality Date   APPENDECTOMY  1979   colorectal   CESAREAN SECTION     COLONOSCOPY WITH PROPOFOL  N/A 03/21/2015   Procedure: COLONOSCOPY WITH PROPOFOL ;  Surgeon: Deward CINDERELLA Piedmont, MD;  Location: [facility] ENDOSCOPY;  Service: Gastroenterology;  Laterality: N/A;   COLONOSCOPY WITH PROPOFOL  N/A 03/21/2020   Procedure: COLONOSCOPY WITH PROPOFOL ;  Surgeon: Maryruth Ole DASEN, MD;  Location: [facility] ENDOSCOPY;  Service: Endoscopy;  Laterality: N/A;   COLOSTOMY     surgery for colorectal cancer     VENTRAL HERNIA REPAIR Right [phone]    FAMILY HISTORY Family History  Problem Relation Age of Onset   Hypertension Mother    Hypertension Father    Breast cancer Paternal Aunt    GYNECOLOGIC HISTORY:  No LMP recorded. Patient is postmenopausal.   ADVANCED DIRECTIVES:   HEALTH MAINTENANCE: Social History   Tobacco Use   Smoking status: Never   Smokeless tobacco: Never  Vaping Use   Vaping status: Never Used  Substance Use  Topics   Alcohol use: No    Alcohol/week: 0.0 standard drinks of alcohol   Drug use: No   No Known Allergies  Current Outpatient Medications  Medication Sig Dispense Refill   aspirin EC 81 MG tablet Take 81 mg by mouth daily as needed.     calcium citrate-vitamin D (CITRACAL+D) 315-200 MG-UNIT per tablet Take 1 tablet by mouth 2 (two)  times daily.     donepezil (ARICEPT) 10 MG tablet Take 10 mg by mouth daily.     losartan -hydrochlorothiazide  (HYZAAR) 50-12.5 MG tablet Take 1 tablet by mouth daily. 90 tablet 3   lovastatin  (MEVACOR ) 10 MG tablet Take 1 tablet (10 mg total) by mouth at bedtime. 90 tablet 3   Multiple Vitamin (MULTI-VITAMINS) TABS Take 1 tablet by mouth daily.      No current facility-administered medications for this visit.   Facility-Administered Medications Ordered in Other Visits  Medication Dose Route Frequency Provider Last Rate Last Admin   sodium chloride  flush (NS) 0.9 % injection 10 mL  10 mL Intravenous PRN Choksi, Vester, MD   10 mL at 05/31/15 1040   OBJECTIVE: BP (!) 160/52 Comment: 2ndreadunf advised to speak to pcp  Pulse 60   Temp 97.7 F (36.5 C) (Tympanic)   Resp 18   Wt 102 lb 9.6 oz (46.5 kg)   SpO2 100%   BMI 19.39 kg/m    Body mass index is 19.39 kg/m.      ECOG FS:0 - Asymptomatic  General: Well-developed, well-nourished, no acute distress. Eyes: Pink conjunctiva, anicteric sclera. Lungs: Clear to auscultation bilaterally.  No audible wheezing or coughing Heart: Regular rate and rhythm.  Abdomen: Soft, nontender, nondistended. Soft stool in colostomy bag. Musculoskeletal: No edema, cyanosis, or clubbing. Neuro: Alert, answering all questions appropriately. Cranial nerves grossly intact. Skin: No rashes or petechiae noted. Psych: Normal affect.   LAB RESULTS: No visits with results within 3 Day(s) from this visit.  Latest known visit with results is:  Office Visit on 07/26/2023  Component Date Value Ref Range Status   Total Bilirubin 07/26/2023 0.7  0.2 - 1.2 mg/dL Final   Bilirubin, Direct 07/26/2023 0.2  0.0 - 0.3 mg/dL Final   Alkaline Phosphatase 07/26/2023 36 (L)  39 - 117 U/L Final   AST 07/26/2023 23  0 - 37 U/L Final   ALT 07/26/2023 20  0 - 35 U/L Final   Total Protein 07/26/2023 6.6  6.0 - 8.3 g/dL Final   Albumin 93/72/7974 3.9  3.5 - 5.2 g/dL Final    Sodium 93/72/7974 142  135 - 145 mEq/L Final   Potassium 07/26/2023 4.0  3.5 - 5.1 mEq/L Final   Chloride 07/26/2023 104  96 - 112 mEq/L Final   CO2 07/26/2023 34 (H)  19 - 32 mEq/L Final   Glucose, Bld 07/26/2023 112 (H)  70 - 99 mg/dL Final   BUN 93/72/7974 39 (H)  6 - 23 mg/dL Final   Creatinine, Ser 07/26/2023 0.83  0.40 - 1.20 mg/dL Final   GFR 93/72/7974 64.09  >60.00 mL/min Final   Calculated using the CKD-EPI Creatinine Equation (2021)   Calcium 07/26/2023 9.3  8.4 - 10.5 mg/dL Final   Hgb J8r MFr Bld 07/26/2023 5.7  4.6 - 6.5 % Final   Glycemic Control Guidelines for People with Diabetes:Non Diabetic:  <6%Goal of Therapy: <7%Additional Action Suggested:  >8%    Cholesterol 07/26/2023 95  0 - 200 mg/dL Final   ATP III Classification  Desirable:  < 200 mg/dL               Borderline High:  200 - 239 mg/dL          High:  > = 759 mg/dL   Triglycerides 93/72/7974 60.0  0.0 - 149.0 mg/dL Final   Normal:  <849 mg/dLBorderline High:  150 - 199 mg/dL   HDL 93/72/7974 66.89 (L)  >60.99 mg/dL Final   VLDL 93/72/7974 12.0  0.0 - 40.0 mg/dL Final   LDL Cholesterol 07/26/2023 50  0 - 99 mg/dL Final   Total CHOL/HDL Ratio 07/26/2023 3   Final                  Men          Women1/2 Average Risk     3.4          3.3Average Risk          5.0          4.42X Average Risk          9.6          7.13X Average Risk          15.0          11.0                       NonHDL 07/26/2023 61.62   Final   NOTE:  Non-HDL goal should be 30 mg/dL higher than patient's LDL goal (i.e. LDL goal of < 70 mg/dL, would have non-HDL goal of < 100 mg/dL)   TSH 93/72/7974 8.12  0.35 - 5.50 uIU/mL Final   WBC 07/26/2023 6.2  4.0 - 10.5 K/uL Final   RBC 07/26/2023 3.52 (L)  3.87 - 5.11 Mil/uL Final   Hemoglobin 07/26/2023 12.7  12.0 - 15.0 g/dL Final   HCT 93/72/7974 37.1  36.0 - 46.0 % Final   MCV 07/26/2023 105.4 (H)  78.0 - 100.0 fl Final   MCHC 07/26/2023 34.2  30.0 - 36.0 g/dL Final   RDW 93/72/7974 13.7  11.5 -  15.5 % Final   Platelets 07/26/2023 239.0  150.0 - 400.0 K/uL Final   Neutrophils Relative % 07/26/2023 78.9 (H)  43.0 - 77.0 % Final   Lymphocytes Relative 07/26/2023 10.4 (L)  12.0 - 46.0 % Final   Monocytes Relative 07/26/2023 9.2  3.0 - 12.0 % Final   Eosinophils Relative 07/26/2023 0.4  0.0 - 5.0 % Final   Basophils Relative 07/26/2023 1.1  0.0 - 3.0 % Final   Neutro Abs 07/26/2023 4.9  1.4 - 7.7 K/uL Final   Lymphs Abs 07/26/2023 0.6 (L)  0.7 - 4.0 K/uL Final   Monocytes Absolute 07/26/2023 0.6  0.1 - 1.0 K/uL Final   Eosinophils Absolute 07/26/2023 0.0  0.0 - 0.7 K/uL Final   Basophils Absolute 07/26/2023 0.1  0.0 - 0.1 K/uL Final   Lab Results  Component Value Date   CEA 2.3 01/11/2016    STUDIES: No results found.  ASSESSMENT:  Adenocarcinoma of the rectum, stage III, T3 N1 M0.  PLAN:    1. Rectal cancer: Initially presented with colonoscopy with cancer at the distal rectum T3, N1 Mx. She completed neoadjuvant chemotherapy and radiation 01/12/13. She underwent abdominoperineal resection with no residual disease in February 2015 then 6 cycles of adjuvant folfox completed 08/31/13. Treatment with Dr Wilder and Dr Claudene. CEA has been followed and normal. She is now followed by Dr Maryruth.  Patient's most recent colonoscopy in February 2022 was reported as normal with no evidence of progression or recurrent disease. Plan is to repeat in 2027 if benefits outweighs risks given her age. Her CEA is pending at time of visit. She is losing weight without trying but otherwise is aysmptomatic of recurrent disease. Risk of recurrence 10 years from completion of treatment is low but will plan to get ct c/a/p to evaluate for recurrent/metasatic disease. Discussed if negative releasing her from clinic to her pcp for annual surveillance. She prefers to come to the cancer center and will be followed in our survivorship clinic. 2.  Macrocytosis without anemia- MCV 105.4. B12 and folate were normal. No  other cytopenias. Monitor.  3. Weight loss- unintentional. Imaging as above. Provided with samples of ensure complete.    Disposition:  Lab today (cbc, cmp, cea), Ct c/a/p 1 year- lab, see me for rectal cancer surveillance- la   Patient expressed understanding and was in agreement with this plan. She also understands that She can call clinic at any time with any questions, concerns, or complaints.   Tinnie KANDICE Dawn, NP 08/22/23

## 2023-08-23 LAB — CEA: CEA: 3.4 ng/mL (ref 0.0–4.7)

## 2023-08-27 ENCOUNTER — Telehealth: Payer: Self-pay | Admitting: Nurse Practitioner

## 2023-08-27 NOTE — Telephone Encounter (Signed)
 Pt son called and requested that we reschedule her CT so he can bring her. He will be out of town til 8/10. Rescheduled CT with him on the phone. [facility]

## 2023-08-29 ENCOUNTER — Ambulatory Visit

## 2023-08-30 ENCOUNTER — Other Ambulatory Visit (INDEPENDENT_AMBULATORY_CARE_PROVIDER_SITE_OTHER)

## 2023-08-30 DIAGNOSIS — D72818 Other decreased white blood cell count: Secondary | ICD-10-CM

## 2023-08-30 NOTE — Addendum Note (Signed)
 Addended by: TANDA HARVEY D on: 08/30/2023 11:22 AM   Modules accepted: Orders

## 2023-09-04 LAB — VITAMIN B12: Vitamin B-12: 570 pg/mL (ref 200–1100)

## 2023-09-04 LAB — CBC WITH DIFFERENTIAL/PLATELET
Absolute Lymphocytes: 650 {cells}/uL — ABNORMAL LOW (ref 850–3900)
Absolute Monocytes: 400 {cells}/uL (ref 200–950)
Basophils Absolute: 70 {cells}/uL (ref 0–200)
Basophils Relative: 1.4 %
Eosinophils Absolute: 20 {cells}/uL (ref 15–500)
Eosinophils Relative: 0.4 %
HCT: 41 % (ref 35.0–45.0)
Hemoglobin: 13.2 g/dL (ref 11.7–15.5)
MCH: 35.2 pg — ABNORMAL HIGH (ref 27.0–33.0)
MCHC: 32.2 g/dL (ref 32.0–36.0)
MCV: 109.3 fL — ABNORMAL HIGH (ref 80.0–100.0)
MPV: 10.4 fL (ref 7.5–12.5)
Monocytes Relative: 8 %
Neutro Abs: 3860 {cells}/uL (ref 1500–7800)
Neutrophils Relative %: 77.2 %
Platelets: 269 Thousand/uL (ref 140–400)
RBC: 3.75 Million/uL — ABNORMAL LOW (ref 3.80–5.10)
RDW: 11.8 % (ref 11.0–15.0)
Total Lymphocyte: 13 %
WBC: 5 Thousand/uL (ref 3.8–10.8)

## 2023-09-04 LAB — FOLATE RBC: RBC Folate: 642 ng/mL (ref >280)

## 2023-09-10 ENCOUNTER — Ambulatory Visit
Admission: RE | Admit: 2023-09-10 | Discharge: 2023-09-10 | Disposition: A | Discharge status: Home / Self Care | Source: Ambulatory Visit | Attending: Nurse Practitioner | Admitting: Nurse Practitioner

## 2023-09-10 DIAGNOSIS — Z85048 Personal history of other malignant neoplasm of rectum, rectosigmoid junction, and anus: Secondary | ICD-10-CM | POA: Diagnosis not present

## 2023-09-10 DIAGNOSIS — C20 Malignant neoplasm of rectum: Secondary | ICD-10-CM | POA: Diagnosis not present

## 2023-09-10 DIAGNOSIS — R634 Abnormal weight loss: Secondary | ICD-10-CM | POA: Insufficient documentation

## 2023-09-10 DIAGNOSIS — Z08 Encounter for follow-up examination after completed treatment for malignant neoplasm: Secondary | ICD-10-CM | POA: Insufficient documentation

## 2023-09-10 DIAGNOSIS — R918 Other nonspecific abnormal finding of lung field: Secondary | ICD-10-CM | POA: Diagnosis not present

## 2023-09-10 MED ORDER — IOHEXOL 300 MG/ML  SOLN
80.0000 mL | Freq: Once | INTRAMUSCULAR | Status: AC | PRN
  Administered 2023-09-10 (×2): 80 mL via INTRAVENOUS

## 2023-09-13 ENCOUNTER — Ambulatory Visit: Payer: Self-pay | Admitting: Internal Medicine

## 2023-09-13 ENCOUNTER — Other Ambulatory Visit: Payer: Self-pay | Admitting: Internal Medicine

## 2023-09-13 DIAGNOSIS — D7589 Other specified diseases of blood and blood-forming organs: Secondary | ICD-10-CM

## 2023-09-13 NOTE — Telephone Encounter (Signed)
 Copied from CRM [id number]. Topic: General - Other >> Sep 13, 2023  9:15 AM Turkey A wrote: Reason for CRM: Patient's son was returning call for [facility] call when available

## 2023-09-13 NOTE — Progress Notes (Signed)
Order placed for f/u cbc.   

## 2023-09-27 ENCOUNTER — Other Ambulatory Visit: Payer: Self-pay

## 2023-09-27 ENCOUNTER — Telehealth: Payer: Self-pay

## 2023-09-27 ENCOUNTER — Telehealth: Payer: Self-pay | Admitting: *Deleted

## 2023-09-27 DIAGNOSIS — R634 Abnormal weight loss: Secondary | ICD-10-CM

## 2023-09-27 DIAGNOSIS — Z08 Encounter for follow-up examination after completed treatment for malignant neoplasm: Secondary | ICD-10-CM

## 2023-09-27 NOTE — Telephone Encounter (Signed)
 7 of [employer] sent in a message through triage at 4:20 and wanted me the CT results and PET that is going to be happening on 9 8.  He is wants to have someone to call him but there is nobody here at this time except for myself. I will send it to finnegan team

## 2023-09-27 NOTE — Telephone Encounter (Signed)
 Called patient regarding MD's recommendation for PET. She seemed confused, she was unaware of the cancer center or any provider I mentioned. She did not remember getting previous CT scan. I mentioned the PET and she was skeptical. I told her I would call her son and she agreed. Left voicemail for son to call us  back with confirmation of appt time of 9/8 at 9a.

## 2023-10-01 ENCOUNTER — Telehealth: Payer: Self-pay

## 2023-10-01 NOTE — Telephone Encounter (Signed)
 Pt's son left message to give him a call about his moms ct results and needing a possible pet scan.

## 2023-10-07 ENCOUNTER — Ambulatory Visit
Admission: RE | Admit: 2023-10-07 | Discharge: 2023-10-07 | Disposition: A | Discharge status: Home / Self Care | Source: Ambulatory Visit | Attending: Nurse Practitioner | Admitting: Nurse Practitioner

## 2023-10-07 DIAGNOSIS — Z85048 Personal history of other malignant neoplasm of rectum, rectosigmoid junction, and anus: Secondary | ICD-10-CM | POA: Insufficient documentation

## 2023-10-07 DIAGNOSIS — R634 Abnormal weight loss: Secondary | ICD-10-CM | POA: Insufficient documentation

## 2023-10-07 DIAGNOSIS — Z08 Encounter for follow-up examination after completed treatment for malignant neoplasm: Secondary | ICD-10-CM | POA: Diagnosis not present

## 2023-10-07 DIAGNOSIS — C20 Malignant neoplasm of rectum: Secondary | ICD-10-CM | POA: Diagnosis not present

## 2023-10-07 LAB — GLUCOSE, CAPILLARY: Glucose-Capillary: 137 mg/dL — ABNORMAL HIGH (ref 70–99)

## 2023-10-07 MED ORDER — FLUDEOXYGLUCOSE F - 18 (FDG) INJECTION
5.8900 | Freq: Once | INTRAVENOUS | Status: AC | PRN
  Administered 2023-10-07: 5.89 via INTRAVENOUS

## 2023-10-08 DIAGNOSIS — Z85038 Personal history of other malignant neoplasm of large intestine: Secondary | ICD-10-CM | POA: Diagnosis not present

## 2023-10-08 DIAGNOSIS — Z933 Colostomy status: Secondary | ICD-10-CM | POA: Diagnosis not present

## 2023-10-17 DIAGNOSIS — E559 Vitamin D deficiency, unspecified: Secondary | ICD-10-CM | POA: Diagnosis not present

## 2023-10-17 DIAGNOSIS — R634 Abnormal weight loss: Secondary | ICD-10-CM | POA: Diagnosis not present

## 2023-10-17 DIAGNOSIS — Z9189 Other specified personal risk factors, not elsewhere classified: Secondary | ICD-10-CM | POA: Diagnosis not present

## 2023-10-17 DIAGNOSIS — G301 Alzheimer's disease with late onset: Secondary | ICD-10-CM | POA: Diagnosis not present

## 2023-10-22 ENCOUNTER — Ambulatory Visit: Payer: Self-pay | Admitting: Nurse Practitioner

## 2023-10-25 ENCOUNTER — Other Ambulatory Visit (INDEPENDENT_AMBULATORY_CARE_PROVIDER_SITE_OTHER)

## 2023-10-25 DIAGNOSIS — D7589 Other specified diseases of blood and blood-forming organs: Secondary | ICD-10-CM | POA: Diagnosis not present

## 2023-10-25 LAB — CBC WITH DIFFERENTIAL/PLATELET
Basophils Absolute: 0.1 K/uL (ref 0.0–0.1)
Basophils Relative: 1.3 % (ref 0.0–3.0)
Eosinophils Absolute: 0 K/uL (ref 0.0–0.7)
Eosinophils Relative: 0.8 % (ref 0.0–5.0)
HCT: 39.3 % (ref 36.0–46.0)
Hemoglobin: 13.3 g/dL (ref 12.0–15.0)
Lymphocytes Relative: 20 % (ref 12.0–46.0)
Lymphs Abs: 1.2 K/uL (ref 0.7–4.0)
MCHC: 33.7 g/dL (ref 30.0–36.0)
MCV: 105.5 fl — ABNORMAL HIGH (ref 78.0–100.0)
Monocytes Absolute: 0.5 K/uL (ref 0.1–1.0)
Monocytes Relative: 8.1 % (ref 3.0–12.0)
Neutro Abs: 4.1 K/uL (ref 1.4–7.7)
Neutrophils Relative %: 69.8 % (ref 43.0–77.0)
Platelets: 256 K/uL (ref 150.0–400.0)
RBC: 3.73 Mil/uL — ABNORMAL LOW (ref 3.87–5.11)
RDW: 13.9 % (ref 11.5–15.5)
WBC: 5.8 K/uL (ref 4.0–10.5)

## 2023-10-28 ENCOUNTER — Ambulatory Visit: Payer: Self-pay | Admitting: Internal Medicine

## 2023-11-27 ENCOUNTER — Encounter: Payer: Self-pay | Admitting: Internal Medicine

## 2023-11-27 ENCOUNTER — Ambulatory Visit: Admitting: Internal Medicine

## 2023-11-27 VITALS — BP 120/64 | HR 64 | Temp 97.9°F | Ht 61.0 in | Wt 100.6 lb

## 2023-11-27 DIAGNOSIS — E7800 Pure hypercholesterolemia, unspecified: Secondary | ICD-10-CM | POA: Diagnosis not present

## 2023-11-27 DIAGNOSIS — R739 Hyperglycemia, unspecified: Secondary | ICD-10-CM | POA: Diagnosis not present

## 2023-11-27 DIAGNOSIS — Z0000 Encounter for general adult medical examination without abnormal findings: Secondary | ICD-10-CM | POA: Diagnosis not present

## 2023-11-27 DIAGNOSIS — Z23 Encounter for immunization: Secondary | ICD-10-CM | POA: Diagnosis not present

## 2023-11-27 DIAGNOSIS — Z1231 Encounter for screening mammogram for malignant neoplasm of breast: Secondary | ICD-10-CM

## 2023-11-27 DIAGNOSIS — Z85048 Personal history of other malignant neoplasm of rectum, rectosigmoid junction, and anus: Secondary | ICD-10-CM

## 2023-11-27 DIAGNOSIS — I771 Stricture of artery: Secondary | ICD-10-CM

## 2023-11-27 DIAGNOSIS — I10 Essential (primary) hypertension: Secondary | ICD-10-CM

## 2023-11-27 DIAGNOSIS — Z933 Colostomy status: Secondary | ICD-10-CM

## 2023-11-27 DIAGNOSIS — F0280 Dementia in other diseases classified elsewhere without behavioral disturbance: Secondary | ICD-10-CM

## 2023-11-27 DIAGNOSIS — I6523 Occlusion and stenosis of bilateral carotid arteries: Secondary | ICD-10-CM

## 2023-11-27 DIAGNOSIS — G301 Alzheimer's disease with late onset: Secondary | ICD-10-CM

## 2023-11-27 DIAGNOSIS — R634 Abnormal weight loss: Secondary | ICD-10-CM

## 2023-11-27 NOTE — Assessment & Plan Note (Signed)
 Physical today 11/27/23.  Mammogram 01/15/23- Birads I.  Colonoscopy 03/21/20 - recommended f/u in 5 years.

## 2023-11-27 NOTE — Progress Notes (Signed)
 Subjective:    [name], female    DOB: 05/06/1935, 88 y.o.   MRN: [id number]  Patient here for  Chief Complaint  Patient presents with   Annual Exam    CPE    HPI Here for a physical exam. Accompanied by her son. History obtained from both of them. Evaluated by neurology 10/17/23 - f/u alzheimer's disease. Recommended starting memantine. Continue aricept. Had f/u with oncology 08/22/23 - f/u adenocarcionma of the rectum. Patient's most recent colonoscopy in February 2022 was reported as normal with no evidence of progression or recurrent disease. Plan is to repeat in 2027 if benefits outweighs risks given her age. Unintentional weight loss. Discussed with her the need to eat regular meals. Discussed nutritional shakes. 10/09/23 - PET - no evidence of metabolically active disease. F/u [facility] 08/15/23 - Carotid Duplex shows RICA 1-39%and LICA 40-59% stenosis. The left vertebral has antegrade flow in the right vertebral artery has retrograde flow. Continue antiplatelet therapy. F/u 12 months.    Past Medical History:  Diagnosis Date   Colon cancer [facility])    History of shingles    twice   Hypercholesteremia    Hypertension    Left carotid bruit    Nephrolithiasis    Neuropathy    Personal history of chemotherapy 2014   Colon   Personal history of radiation therapy 2014   Colon   Urine incontinence 03/06/2013   h/o   Past Surgical History:  Procedure Laterality Date   APPENDECTOMY  1979   colorectal   CESAREAN SECTION     COLONOSCOPY WITH PROPOFOL  N/A 03/21/2015   Procedure: COLONOSCOPY WITH PROPOFOL ;  Surgeon: Deward CINDERELLA Piedmont, MD;  Location: [facility] ENDOSCOPY;  Service: Gastroenterology;  Laterality: N/A;   COLONOSCOPY WITH PROPOFOL  N/A 03/21/2020   Procedure: COLONOSCOPY WITH PROPOFOL ;  Surgeon: Maryruth Ole DASEN, MD;  Location: [facility] ENDOSCOPY;  Service: Endoscopy;  Laterality: N/A;   COLOSTOMY     surgery for colorectal cancer     VENTRAL HERNIA REPAIR Right [phone]    Family History  Problem Relation Age of Onset   Hypertension Mother    Hypertension Father    Breast cancer Paternal Aunt    Social History   Socioeconomic History   Marital status: Widowed    Spouse name: Not on file   Number of children: Not on file   Years of education: Not on file   Highest education level: Bachelor's degree (e.g., BA, AB, BS)  Occupational History   Not on file  Tobacco Use   Smoking status: Never   Smokeless tobacco: Never  Vaping Use   Vaping status: Never Used  Substance and Sexual Activity   Alcohol use: No    Alcohol/week: 0.0 standard drinks of alcohol   Drug use: No   Sexual activity: Not Currently  Other Topics Concern   Not on file  Social History Narrative   Not on file   Social Drivers of Health   Financial Resource Strain: Low Risk  (04/11/2023)   Overall Financial Resource Strain (CARDIA)    Difficulty of Paying Living Expenses: Not hard at all  Food Insecurity: No Food Insecurity (04/11/2023)   Hunger Vital Sign    Worried About Running Out of Food in the Last Year: Never true    Ran Out of Food in the Last Year: Never true  Transportation Needs: No Transportation Needs (04/11/2023)   PRAPARE - Transportation    Lack of Transportation (Medical): No    Lack  of Transportation (Non-Medical): No  Physical Activity: Sufficiently Active (04/11/2023)   Exercise Vital Sign    Days of Exercise per Week: 7 days    Minutes of Exercise per Session: 30 min  Stress: No Stress Concern Present (04/11/2023)   [employer] of Occupational Health - Occupational Stress Questionnaire    Feeling of Stress : Not at all  Social Connections: Moderately Integrated (04/11/2023)   Social Connection and Isolation Panel    Frequency of Communication with Friends and Family: More than three times a week    Frequency of Social Gatherings with Friends and Family: Twice a week    Attends Religious Services: More than 4 times per year    Active Member of  [employer] or Organizations: Yes    Attends Banker Meetings: More than 4 times per year    Marital Status: Widowed     Review of Systems  Constitutional:  Negative for appetite change.       Previous weight loss.   HENT:  Negative for congestion, sinus pressure and sore throat.   Eyes:  Negative for pain and visual disturbance.  Respiratory:  Negative for cough, chest tightness and shortness of breath.   Cardiovascular:  Negative for chest pain, palpitations and leg swelling.  Gastrointestinal:  Negative for abdominal pain, diarrhea, nausea and vomiting.  Genitourinary:  Negative for difficulty urinating and dysuria.  Musculoskeletal:  Negative for joint swelling and myalgias.  Skin:  Negative for color change and rash.  Neurological:  Negative for dizziness and headaches.  Hematological:  Negative for adenopathy. Does not bruise/bleed easily.  Psychiatric/Behavioral:  Negative for agitation and dysphoric mood.        Objective:     BP 120/64   Pulse 64   Temp 97.9 F (36.6 C) (Oral)   Ht 5' 1 (1.549 m)   Wt 100 lb 9.6 oz (45.6 kg)   SpO2 99%   BMI 19.01 kg/m  Wt Readings from Last 3 Encounters:  11/27/23 100 lb 9.6 oz (45.6 kg)  08/22/23 102 lb 9.6 oz (46.5 kg)  08/15/23 102 lb 2 oz (46.3 kg)    Physical Exam Vitals reviewed.  Constitutional:      General: She is not in acute distress.    Appearance: Normal appearance. She is well-developed.  HENT:     Head: Normocephalic and atraumatic.     Right Ear: External ear normal.     Left Ear: External ear normal.     Mouth/Throat:     Pharynx: No oropharyngeal exudate or posterior oropharyngeal erythema.  Eyes:     General: No scleral icterus.       Right eye: No discharge.        Left eye: No discharge.     Conjunctiva/sclera: Conjunctivae normal.  Neck:     Thyroid : No thyromegaly.  Cardiovascular:     Rate and Rhythm: Normal rate and regular rhythm.  Pulmonary:     Effort: No tachypnea, accessory  muscle usage or respiratory distress.     Breath sounds: Normal breath sounds. No decreased breath sounds or wheezing.  Chest:  Breasts:    Right: No inverted nipple, mass, nipple discharge or tenderness (no axillary adenopathy).     Left: No inverted nipple, mass, nipple discharge or tenderness (no axilarry adenopathy).  Abdominal:     General: Bowel sounds are normal.     Palpations: Abdomen is soft.     Tenderness: There is no abdominal tenderness.  Musculoskeletal:  General: No swelling or tenderness.     Cervical back: Neck supple.  Lymphadenopathy:     Cervical: No cervical adenopathy.  Skin:    Findings: No erythema or rash.  Neurological:     Mental Status: She is alert and oriented to person, place, and time.  Psychiatric:        Mood and Affect: Mood normal.        Behavior: Behavior normal.         Outpatient Encounter Medications as of 11/27/2023  Medication Sig   aspirin EC 81 MG tablet Take 81 mg by mouth daily as needed.   calcium citrate-vitamin D (CITRACAL+D) 315-200 MG-UNIT per tablet Take 1 tablet by mouth 2 (two) times daily.   donepezil (ARICEPT) 10 MG tablet Take 10 mg by mouth daily.   losartan -hydrochlorothiazide  (HYZAAR) 50-12.5 MG tablet Take 1 tablet by mouth daily.   lovastatin  (MEVACOR ) 10 MG tablet Take 1 tablet (10 mg total) by mouth at bedtime.   memantine (NAMENDA) 5 MG tablet Take 5 mg by mouth at bedtime.   Multiple Vitamin (MULTI-VITAMINS) TABS Take 1 tablet by mouth daily.    Facility-Administered Encounter Medications as of 11/27/2023  Medication   sodium chloride  flush (NS) 0.9 % injection 10 mL     Lab Results  Component Value Date   WBC 5.8 10/25/2023   HGB 13.3 10/25/2023   HCT 39.3 10/25/2023   PLT 256.0 10/25/2023   GLUCOSE 123 (H) 08/22/2023   CHOL 95 07/26/2023   TRIG 60.0 07/26/2023   HDL 33.10 (L) 07/26/2023   LDLCALC 50 07/26/2023   ALT 24 08/22/2023   AST 26 08/22/2023   NA 138 08/22/2023   K 4.5  08/22/2023   CL 103 08/22/2023   CREATININE 0.86 08/22/2023   BUN 34 (H) 08/22/2023   CO2 28 08/22/2023   TSH 1.87 07/26/2023   INR 1.1 10/21/2012   HGBA1C 5.7 07/26/2023    NM PET Image Initial (PI) Skull Base To Thigh Result Date: 10/09/2023 EXAM: PET AND CT SKULL BASE TO MID THIGH 10/07/2023 10:52:02 AM TECHNIQUE: PET imaging was acquired from the base of the skull to the mid thighs. Non-contrast enhanced computed tomography was obtained for attenuation correction and anatomic localization. RADIOPHARMACEUTICAL: 5.89 mCi F-18 FDG Uptake time 60 minutes. Glucose level 137 mg/dl. Mediastinal blood pool activity has an suv max of 1.5 COMPARISON: CT chest, abdomen and pelvis from 09/10/2023. CLINICAL HISTORY: Unintended weight loss/ new lesions. History of rectal cancer. Left lower abdomen colostomy. FINDINGS: HEAD AND NECK: No tracer avid cervical lymph nodes. CHEST: No tracer avid supraclavicular, axillary, mediastinal or hilar lymph nodes. No suspicious pulmonary nodules are identified. Mild cardiac enlargement. Aortic atherosclerosis and coronary artery calcifications. Calcifications of the aortic and mitral valve. No pericardial or pleural effusion. ABDOMEN AND PELVIS: No abnormal tracer uptake within the liver, pancreas, spleen or adrenal glands. There is a small focus of mild increased uptake measuring 6 mm with SUV max of 2.7 on axial image 91. This is favored to represent a small lymph node within the gastrohepatic ligament. No additional tracer avid lymph nodes within the abdomen or pelvis. Postoperative changes from left lower quadrant colostomy and rectal resection. Presacral soft tissue thickening with small fluid collection is again noted. Fluid collection is unchanged in size measuring 3.0 x 1.5 cm on image 127/6. There is no abnormal tracer uptake identified within this area. No ascites. No signs of peritoneal nodularity. Atherosclerotic calcification involving the abdominal aorta. BONES AND  SOFT TISSUE: No tracer avid bone lesions. Artifact from diffuse skeletal muscle uptake is identified. IMPRESSION: 1. No evidence of metabolically active disease. 2. Small lymph node within the gastrohepatic ligament with mild increased uptake (SUV max 2.7), likely reactive. 3. Stable presacral soft tissue thickening with small fluid collection (3.0 x 1.5 cm), without abnormal tracer uptake, likely post-treatment change. Electronically signed by: Waddell Calk MD 10/09/2023 07:34 AM EDT RP Workstation: [id number]       Assessment & Plan:  Routine general medical examination at a health care facility  Hypercholesterolemia Assessment & Plan: On lovastatin .  Low cholesterol diet and exercise.  Check lipid panel.  Lab Results  Component Value Date   CHOL 95 07/26/2023   HDL 33.10 (L) 07/26/2023   LDLCALC 50 07/26/2023   TRIG 60.0 07/26/2023   CHOLHDL 3 07/26/2023    Orders: -     Hepatic function panel; Future -     Basic metabolic panel with GFR; Future -     Lipid panel; Future  Hyperglycemia Assessment & Plan: Low carb diet and exercise.  Check met b and A1c today.  Lab Results  Component Value Date   HGBA1C 5.7 07/26/2023    Orders: -     Hemoglobin A1c; Future  Health care maintenance Assessment & Plan: Physical today 11/27/23.  Mammogram 01/15/23- Birads I.  Colonoscopy 03/21/20 - recommended f/u in 5 years.       Visit for screening mammogram  Immunization due -     Flu vaccine HIGH DOSE PF(Fluzone Trivalent)  Bilateral carotid artery stenosis Assessment & Plan: F/u [facility] 07/2023 - Carotid Duplex shows RICA 1-39%and LICA 40-59% stenosis. The left vertebral has antegrade flow in the right vertebral artery has retrograde flow.  Continue antiplatelet therapy.  Follow up in 12 months with duplex ultrasound and physical exam    Colostomy status [facility]) Assessment & Plan: Ostomy working well.    History of rectal cancer Assessment & Plan: Had f/u with oncology 08/22/23  - f/u adenocarcionma of the rectum. Patient's most recent colonoscopy in February 2022 was reported as normal with no evidence of progression or recurrent disease. Plan is to repeat in 2027 if benefits outweighs risks given her age.    Hypertension, essential Assessment & Plan: Continue losartan /hctz.  Blood pressure as outlined. Follow pressures. Follow metabolic panel.    Alzheimer's disease with late onset [facility]) Assessment & Plan: Seeing neurology. On aricept. Namenda recently added.    Subclavian arterial stenosis Assessment & Plan: Continue f/u with [facility].    Weight loss Assessment & Plan: Weight loss as outlined. Encourage increased po intake. Nutritional shakes       Allena Hamilton, MD

## 2023-12-01 ENCOUNTER — Encounter: Payer: Self-pay | Admitting: Internal Medicine

## 2023-12-01 DIAGNOSIS — R634 Abnormal weight loss: Secondary | ICD-10-CM | POA: Insufficient documentation

## 2023-12-01 DIAGNOSIS — G301 Alzheimer's disease with late onset: Secondary | ICD-10-CM | POA: Insufficient documentation

## 2023-12-01 NOTE — Assessment & Plan Note (Signed)
Ostomy working well.  

## 2023-12-01 NOTE — Assessment & Plan Note (Signed)
Continue f/u with [facility].  

## 2023-12-01 NOTE — Assessment & Plan Note (Signed)
 On lovastatin .  Low cholesterol diet and exercise.  Check lipid panel.  Lab Results  Component Value Date   CHOL 95 07/26/2023   HDL 33.10 (L) 07/26/2023   LDLCALC 50 07/26/2023   TRIG 60.0 07/26/2023   CHOLHDL 3 07/26/2023

## 2023-12-01 NOTE — Assessment & Plan Note (Signed)
 Low carb diet and exercise.  Check met b and A1c today.  Lab Results  Component Value Date   HGBA1C 5.7 07/26/2023

## 2023-12-01 NOTE — Assessment & Plan Note (Signed)
 F/u [facility] 07/2023 - Carotid Duplex shows RICA 1-39%and LICA 40-59% stenosis. The left vertebral has antegrade flow in the right vertebral artery has retrograde flow.  Continue antiplatelet therapy.  Follow up in 12 months with duplex ultrasound and physical exam

## 2023-12-01 NOTE — Assessment & Plan Note (Signed)
Continue losartan/hctz.  Blood pressure as outlined.  Follow pressures.  Follow metabolic panel.  

## 2023-12-01 NOTE — Assessment & Plan Note (Signed)
 Seeing neurology. On aricept. Namenda recently added.

## 2023-12-01 NOTE — Assessment & Plan Note (Signed)
 Weight loss as outlined. Encourage increased po intake. Nutritional shakes

## 2023-12-01 NOTE — Assessment & Plan Note (Signed)
 Had f/u with oncology 08/22/23 - f/u adenocarcionma of the rectum. Patient's most recent colonoscopy in February 2022 was reported as normal with no evidence of progression or recurrent disease. Plan is to repeat in 2027 if benefits outweighs risks given her age.

## 2024-02-24 ENCOUNTER — Other Ambulatory Visit: Payer: Self-pay | Admitting: Internal Medicine

## 2024-03-05 ENCOUNTER — Ambulatory Visit: Payer: Self-pay

## 2024-03-05 ENCOUNTER — Telehealth: Payer: Self-pay | Admitting: Internal Medicine

## 2024-03-05 ENCOUNTER — Ambulatory Visit

## 2024-03-05 NOTE — Telephone Encounter (Signed)
 FYI Only or Action Required?: Action required by provider: update on patient condition.  Patient was last seen in primary care on 11/27/2023 by Glendia Shad, MD.  Called Nurse Triage reporting Urinary Tract Infection and Altered Mental Status.  Symptoms began 2 days ago.  Interventions attempted: Nothing.  Symptoms are: gradually worsening.  Triage Disposition: See Physician Within 24 Hours  Patient/caregiver understands and will follow disposition?: Yes   Message from Harlene ORN sent at 03/05/2024  9:05 AM EST  Reason for Triage: wants to check her for a UTI. Confusion and memory issues.   Reason for Disposition  [1] Confusion getting worse AND [2] slow onset (days to weeks)  Answer Assessment - Initial Assessment Questions Patient's son called in reporting increased confusion. Patient with dx of Alzheimer's. Patient already scheduled to see PCP on 03/06/24. Son wants to rule out possible urinary tract infection. Patient is incontinent. Advised to keep appointment or proceed to UC for testing.   1. MAIN CONCERN OR SYMPTOM:  What is your main concern right now? What questions do you have? What's the main symptom you're worried about? (e.g., confusion, memory loss)     Increased confusion   2. ONSET:  When did the symptom start (or worsen)? (minutes, hours, days, weeks)     Last 2 days   4. DIAGNOSIS: Was the dementia diagnosed by a doctor? If Yes, ask: When? (e.g., days, months, years ago)     Dx with Alzheimer's  5. MEDICINES: Has there been any change in medicines recently? (e.g., narcotics, antihistamines, benzodiazepines, etc.)     Denies in last several months  6. OTHER SYMPTOMS: Are there any other symptoms? (e.g., cough, falling, fever, pain)     Denies  Protocols used: Dementia Symptoms and Questions-A-AH

## 2024-03-05 NOTE — Telephone Encounter (Signed)
 Lm and sent MyChart message;  Patient has been scheduled for 03/06/2024 at 11:30 am with her provider, Dr Glendia.

## 2024-03-05 NOTE — Telephone Encounter (Signed)
 Copied from CRM #[id number]. Topic: General - Other >> Mar 05, 2024  4:16 PM Delon DASEN wrote: Reason for CRM: son returning call from office, patient has been increasingly confused, patient will be there tomorrow

## 2024-03-05 NOTE — Telephone Encounter (Signed)
 Lvm for pt's son to give office a call back. Need to confirm pt's current symptoms.SABRA okay to relay

## 2024-03-05 NOTE — Telephone Encounter (Signed)
 Lm and Sent MyChart message:  Your appointment today has been canceled with Dr Abbey. Please call the office to schedule with Dr Glendia about this issue.

## 2024-03-05 NOTE — Telephone Encounter (Signed)
 Reviewed pt message. She has an appt with me tomorrow, but it appears that son is concerned regarding increased confusion.  I am ok to check urine, but please call and confirm current symptoms?  Want to make sure nothing more acute going on.

## 2024-03-06 ENCOUNTER — Encounter: Payer: Self-pay | Admitting: Internal Medicine

## 2024-03-06 ENCOUNTER — Ambulatory Visit: Admitting: Internal Medicine

## 2024-03-06 VITALS — BP 136/78 | HR 68 | Temp 97.8°F | Ht 61.0 in | Wt 105.0 lb

## 2024-03-06 DIAGNOSIS — E7800 Pure hypercholesterolemia, unspecified: Secondary | ICD-10-CM

## 2024-03-06 DIAGNOSIS — R739 Hyperglycemia, unspecified: Secondary | ICD-10-CM

## 2024-03-06 DIAGNOSIS — R410 Disorientation, unspecified: Secondary | ICD-10-CM

## 2024-03-06 LAB — LIPID PANEL
Cholesterol: 115 mg/dL (ref 28–200)
HDL: 38 mg/dL — ABNORMAL LOW
LDL Cholesterol: 57 mg/dL (ref 10–99)
NonHDL: 76.7
Total CHOL/HDL Ratio: 3
Triglycerides: 99 mg/dL (ref 10.0–149.0)
VLDL: 19.8 mg/dL (ref 0.0–40.0)

## 2024-03-06 LAB — BASIC METABOLIC PANEL WITH GFR
BUN: 29 mg/dL — ABNORMAL HIGH (ref 6–23)
CO2: 31 meq/L (ref 19–32)
Calcium: 9.1 mg/dL (ref 8.4–10.5)
Chloride: 99 meq/L (ref 96–112)
Creatinine, Ser: 0.79 mg/dL (ref 0.40–1.20)
GFR: 67.71 mL/min
Glucose, Bld: 92 mg/dL (ref 70–99)
Potassium: 4.2 meq/L (ref 3.5–5.1)
Sodium: 138 meq/L (ref 135–145)

## 2024-03-06 LAB — HEPATIC FUNCTION PANEL
ALT: 21 U/L (ref 3–35)
AST: 24 U/L (ref 5–37)
Albumin: 3.9 g/dL (ref 3.5–5.2)
Alkaline Phosphatase: 45 U/L (ref 39–117)
Bilirubin, Direct: 0.2 mg/dL (ref 0.1–0.3)
Total Bilirubin: 0.8 mg/dL (ref 0.2–1.2)
Total Protein: 6.2 g/dL (ref 6.0–8.3)

## 2024-03-06 LAB — HEMOGLOBIN A1C: Hgb A1c MFr Bld: 5.7 % (ref 4.6–6.5)

## 2024-03-06 LAB — POCT URINE DIPSTICK
Bilirubin, UA: NEGATIVE
Glucose, UA: NEGATIVE mg/dL
Ketones, POC UA: NEGATIVE mg/dL
Nitrite, UA: NEGATIVE
POC PROTEIN,UA: 30 — AB
Spec Grav, UA: 1.015
Urobilinogen, UA: 0.2 U/dL
pH, UA: 7

## 2024-03-06 LAB — EXTRA URINE SPECIMEN

## 2024-03-06 NOTE — Progress Notes (Unsigned)
 "  Subjective:    [name], female    DOB: [birth year], 89 y.o.   MRN: [id number]  Patient here for No chief complaint on file.   HPI Here for work in appt. Recently diagnosed with alzhiemiers. Son reports some increased confusion over the last few days. Was concerned regarding possible UTI. Saw neurology 02/19/24 - f/u alzheimer's disease - continue memantine 5mg  q hs, donepezil 10mg  q hs. Had f/u with oncology 08/22/23 - f/u adenocarcionma of the rectum. Patient's most recent colonoscopy in February 2022 was reported as normal with no evidence of progression or recurrent disease. Plan is to repeat in 2027 if benefits outweighs risks given her age. 10/09/23 - PET - no evidence of metabolically active disease. F/u [facility] 08/15/23 - Carotid Duplex shows RICA 1-39%and LICA 40-59% stenosis. The left vertebral has antegrade flow in the right vertebral artery has retrograde flow. Continue antiplatelet therapy. F/u 12 months    Past Medical History:  Diagnosis Date   Colon cancer (HCC)    History of shingles    twice   Hypercholesteremia    Hypertension    Left carotid bruit    Nephrolithiasis    Neuropathy    Personal history of chemotherapy 2014   Colon   Personal history of radiation therapy 2014   Colon   Urine incontinence 03/06/2013   h/o   Past Surgical History:  Procedure Laterality Date   APPENDECTOMY  1979   colorectal   CESAREAN SECTION     COLONOSCOPY WITH PROPOFOL  N/A 03/21/2015   Procedure: COLONOSCOPY WITH PROPOFOL ;  Surgeon: Deward CINDERELLA Piedmont, MD;  Location: [facility] ENDOSCOPY;  Service: Gastroenterology;  Laterality: N/A;   COLONOSCOPY WITH PROPOFOL  N/A 03/21/2020   Procedure: COLONOSCOPY WITH PROPOFOL ;  Surgeon: Maryruth Ole DASEN, MD;  Location: [facility] ENDOSCOPY;  Service: Endoscopy;  Laterality: N/A;   COLOSTOMY     surgery for colorectal cancer     VENTRAL HERNIA REPAIR Right [phone]   Family History  Problem Relation Age of Onset   Hypertension Mother     Hypertension Father    Breast cancer Paternal Aunt    Social History   Socioeconomic History   Marital status: Widowed    Spouse name: Not on file   Number of children: Not on file   Years of education: Not on file   Highest education level: Bachelor's degree (e.g., BA, AB, BS)  Occupational History   Not on file  Tobacco Use   Smoking status: Never   Smokeless tobacco: Never  Vaping Use   Vaping status: Never Used  Substance and Sexual Activity   Alcohol use: No    Alcohol/week: 0.0 standard drinks of alcohol   Drug use: No   Sexual activity: Not Currently  Other Topics Concern   Not on file  Social History Narrative   Not on file   Social Drivers of Health   Tobacco Use: Low Risk  (02/19/2024)   Received from [facility]   Patient History    Smoking Tobacco Use: Never    Smokeless Tobacco Use: Never    Passive Exposure: Not on file  Financial Resource Strain: Low Risk (03/04/2024)   Overall Financial Resource Strain (CARDIA)    Difficulty of Paying Living Expenses: Not very hard  Food Insecurity: No Food Insecurity (03/04/2024)   Epic    Worried About Running Out of Food in the Last Year: Never true    Ran Out of Food in the Last Year: Never  true  Transportation Needs: No Transportation Needs (03/04/2024)   Epic    Lack of Transportation (Medical): No    Lack of Transportation (Non-Medical): No  Physical Activity: Sufficiently Active (03/04/2024)   Exercise Vital Sign    Days of Exercise per Week: 7 days    Minutes of Exercise per Session: 30 min  Stress: No Stress Concern Present (03/04/2024)   [employer] of Occupational Health - Occupational Stress Questionnaire    Feeling of Stress: Not at all  Social Connections: Socially Isolated (03/04/2024)   Social Connection and Isolation Panel    Frequency of Communication with Friends and Family: More than three times a week    Frequency of Social Gatherings with Friends and Family: More than three  times a week    Attends Religious Services: Patient declined    Database Administrator or Organizations: No    Attends Banker Meetings: Not on file    Marital Status: Widowed  Depression (PHQ2-9): Low Risk (08/22/2023)   Depression (PHQ2-9)    PHQ-2 Score: 0  Alcohol Screen: Low Risk (04/11/2023)   Alcohol Screen    Last Alcohol Screening Score (AUDIT): 0  Housing: Low Risk (03/04/2024)   Epic    Unable to Pay for Housing in the Last Year: No    Number of Times Moved in the Last Year: 0    Homeless in the Last Year: No  Utilities: Not At Risk (04/15/2023)   [employer] Utilities    Threatened with loss of utilities: No  Health Literacy: Adequate Health Literacy (04/15/2023)   B1300 Health Literacy    Frequency of need for help with medical instructions: Never     Review of Systems     Objective:     There were no vitals taken for this visit. Wt Readings from Last 3 Encounters:  11/27/23 100 lb 9.6 oz (45.6 kg)  08/22/23 102 lb 9.6 oz (46.5 kg)  08/15/23 102 lb 2 oz (46.3 kg)    Physical Exam  {Perform Simple Foot Exam  Perform Detailed exam:1} {Insert foot Exam (Optional):30965}   Outpatient Encounter Medications as of 03/06/2024  Medication Sig   aspirin EC 81 MG tablet Take 81 mg by mouth daily as needed.   calcium citrate-vitamin D (CITRACAL+D) 315-200 MG-UNIT per tablet Take 1 tablet by mouth 2 (two) times daily.   donepezil (ARICEPT) 10 MG tablet Take 10 mg by mouth daily.   losartan -hydrochlorothiazide  (HYZAAR) 50-12.5 MG tablet Take 1 tablet by mouth daily.   lovastatin  (MEVACOR ) 10 MG tablet TAKE 1 TABLET(10 MG) BY MOUTH AT BEDTIME   memantine (NAMENDA) 5 MG tablet Take 5 mg by mouth at bedtime.   Multiple Vitamin (MULTI-VITAMINS) TABS Take 1 tablet by mouth daily.    Facility-Administered Encounter Medications as of 03/06/2024  Medication   sodium chloride  flush (NS) 0.9 % injection 10 mL     Lab Results  Component Value Date   WBC 5.8 10/25/2023    HGB 13.3 10/25/2023   HCT 39.3 10/25/2023   PLT 256.0 10/25/2023   GLUCOSE 123 (H) 08/22/2023   CHOL 95 07/26/2023   TRIG 60.0 07/26/2023   HDL 33.10 (L) 07/26/2023   LDLCALC 50 07/26/2023   ALT 24 08/22/2023   AST 26 08/22/2023   NA 138 08/22/2023   K 4.5 08/22/2023   CL 103 08/22/2023   CREATININE 0.86 08/22/2023   BUN 34 (H) 08/22/2023   CO2 28 08/22/2023   TSH 1.87 07/26/2023   INR  1.1 10/21/2012   HGBA1C 5.7 07/26/2023    NM PET Image Initial (PI) Skull Base To Thigh Result Date: 10/09/2023 EXAM: PET AND CT SKULL BASE TO MID THIGH 10/07/2023 10:52:02 AM TECHNIQUE: PET imaging was acquired from the base of the skull to the mid thighs. Non-contrast enhanced computed tomography was obtained for attenuation correction and anatomic localization. RADIOPHARMACEUTICAL: 5.89 mCi F-18 FDG Uptake time 60 minutes. Glucose level 137 mg/dl. Mediastinal blood pool activity has an suv max of 1.5 COMPARISON: CT chest, abdomen and pelvis from 09/10/2023. CLINICAL HISTORY: Unintended weight loss/ new lesions. History of rectal cancer. Left lower abdomen colostomy. FINDINGS: HEAD AND NECK: No tracer avid cervical lymph nodes. CHEST: No tracer avid supraclavicular, axillary, mediastinal or hilar lymph nodes. No suspicious pulmonary nodules are identified. Mild cardiac enlargement. Aortic atherosclerosis and coronary artery calcifications. Calcifications of the aortic and mitral valve. No pericardial or pleural effusion. ABDOMEN AND PELVIS: No abnormal tracer uptake within the liver, pancreas, spleen or adrenal glands. There is a small focus of mild increased uptake measuring 6 mm with SUV max of 2.7 on axial image 91. This is favored to represent a small lymph node within the gastrohepatic ligament. No additional tracer avid lymph nodes within the abdomen or pelvis. Postoperative changes from left lower quadrant colostomy and rectal resection. Presacral soft tissue thickening with small fluid collection is  again noted. Fluid collection is unchanged in size measuring 3.0 x 1.5 cm on image 127/6. There is no abnormal tracer uptake identified within this area. No ascites. No signs of peritoneal nodularity. Atherosclerotic calcification involving the abdominal aorta. BONES AND SOFT TISSUE: No tracer avid bone lesions. Artifact from diffuse skeletal muscle uptake is identified. IMPRESSION: 1. No evidence of metabolically active disease. 2. Small lymph node within the gastrohepatic ligament with mild increased uptake (SUV max 2.7), likely reactive. 3. Stable presacral soft tissue thickening with small fluid collection (3.0 x 1.5 cm), without abnormal tracer uptake, likely post-treatment change. Electronically signed by: Waddell Calk MD 10/09/2023 07:34 AM EDT RP Workstation: [id number]       Assessment & Plan:  There are no diagnoses linked to this encounter.   Allena Hamilton, MD "

## 2024-03-30 ENCOUNTER — Ambulatory Visit: Admitting: Internal Medicine

## 2024-04-20 ENCOUNTER — Ambulatory Visit

## 2024-08-10 ENCOUNTER — Ambulatory Visit (INDEPENDENT_AMBULATORY_CARE_PROVIDER_SITE_OTHER): Admitting: Vascular Surgery

## 2024-08-10 ENCOUNTER — Encounter (INDEPENDENT_AMBULATORY_CARE_PROVIDER_SITE_OTHER)

## 2024-08-21 ENCOUNTER — Ambulatory Visit: Admitting: Nurse Practitioner

## 2024-08-21 ENCOUNTER — Other Ambulatory Visit

## 2024-09-03 ENCOUNTER — Ambulatory Visit: Admitting: Internal Medicine
